# Patient Record
Sex: Female | Born: 1985 | Race: Black or African American | Hispanic: No | Marital: Single | Smoking: Never smoker
Health system: Southern US, Community
[De-identification: ages and names within clinical notes are randomized; demographics above are authoritative.]

## PROBLEM LIST (undated history)

## (undated) DIAGNOSIS — F22 Delusional disorders: Secondary | ICD-10-CM

## (undated) DIAGNOSIS — F259 Schizoaffective disorder, unspecified: Secondary | ICD-10-CM

## (undated) DIAGNOSIS — F319 Bipolar disorder, unspecified: Secondary | ICD-10-CM

---
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
## 2005-11-24 ENCOUNTER — Emergency Department (HOSPITAL_COMMUNITY): Admission: EM | Admit: 2005-11-24 | Discharge: 2005-11-24 | Payer: Self-pay | Admitting: Family Medicine

## 2006-01-29 ENCOUNTER — Emergency Department (HOSPITAL_COMMUNITY): Admission: EM | Admit: 2006-01-29 | Discharge: 2006-01-30 | Payer: Self-pay | Admitting: *Deleted

## 2006-05-27 ENCOUNTER — Ambulatory Visit: Payer: Self-pay | Admitting: *Deleted

## 2006-05-27 ENCOUNTER — Inpatient Hospital Stay (HOSPITAL_COMMUNITY): Admission: AD | Admit: 2006-05-27 | Discharge: 2006-05-31 | Payer: Self-pay | Admitting: *Deleted

## 2006-10-25 ENCOUNTER — Emergency Department (HOSPITAL_COMMUNITY): Admission: EM | Admit: 2006-10-25 | Discharge: 2006-10-25 | Payer: Self-pay | Admitting: Emergency Medicine

## 2006-11-03 ENCOUNTER — Emergency Department (HOSPITAL_COMMUNITY): Admission: EM | Admit: 2006-11-03 | Discharge: 2006-11-04 | Payer: Self-pay | Admitting: *Deleted

## 2006-11-27 ENCOUNTER — Emergency Department (HOSPITAL_COMMUNITY): Admission: EM | Admit: 2006-11-27 | Discharge: 2006-11-27 | Payer: Self-pay | Admitting: Emergency Medicine

## 2007-10-18 ENCOUNTER — Emergency Department (HOSPITAL_COMMUNITY): Admission: EM | Admit: 2007-10-18 | Discharge: 2007-10-18 | Payer: Self-pay | Admitting: Emergency Medicine

## 2007-10-21 ENCOUNTER — Inpatient Hospital Stay (HOSPITAL_COMMUNITY): Admission: AD | Admit: 2007-10-21 | Discharge: 2007-10-29 | Payer: Self-pay | Admitting: Psychiatry

## 2007-10-21 ENCOUNTER — Ambulatory Visit: Payer: Self-pay | Admitting: Psychiatry

## 2009-02-25 ENCOUNTER — Emergency Department (HOSPITAL_COMMUNITY): Admission: EM | Admit: 2009-02-25 | Discharge: 2009-02-26 | Payer: Self-pay | Admitting: Emergency Medicine

## 2009-08-25 DIAGNOSIS — F319 Bipolar disorder, unspecified: Secondary | ICD-10-CM

## 2009-08-25 HISTORY — DX: Bipolar disorder, unspecified: F31.9

## 2009-09-09 ENCOUNTER — Emergency Department (HOSPITAL_COMMUNITY): Admission: EM | Admit: 2009-09-09 | Discharge: 2009-09-09 | Payer: Self-pay | Admitting: Emergency Medicine

## 2009-09-10 ENCOUNTER — Emergency Department (HOSPITAL_COMMUNITY): Admission: EM | Admit: 2009-09-10 | Discharge: 2009-09-10 | Payer: Self-pay | Admitting: Emergency Medicine

## 2009-10-19 ENCOUNTER — Emergency Department (HOSPITAL_COMMUNITY): Admission: EM | Admit: 2009-10-19 | Discharge: 2009-10-20 | Payer: Self-pay | Admitting: Emergency Medicine

## 2009-10-27 ENCOUNTER — Emergency Department (HOSPITAL_COMMUNITY): Admission: EM | Admit: 2009-10-27 | Discharge: 2009-10-27 | Payer: Self-pay | Admitting: Emergency Medicine

## 2010-07-19 ENCOUNTER — Emergency Department (HOSPITAL_COMMUNITY): Admission: EM | Admit: 2010-07-19 | Discharge: 2010-07-19 | Payer: Self-pay | Admitting: Family Medicine

## 2010-11-05 LAB — POCT URINALYSIS DIPSTICK
Bilirubin Urine: NEGATIVE
Glucose, UA: NEGATIVE mg/dL
Nitrite: NEGATIVE
Specific Gravity, Urine: 1.015 (ref 1.005–1.030)
Urobilinogen, UA: 0.2 mg/dL (ref 0.0–1.0)

## 2010-11-05 LAB — POCT PREGNANCY, URINE: Preg Test, Ur: NEGATIVE

## 2010-11-10 LAB — POCT PREGNANCY, URINE: Preg Test, Ur: NEGATIVE

## 2010-11-10 LAB — POCT URINALYSIS DIP (DEVICE)
Bilirubin Urine: NEGATIVE
Glucose, UA: NEGATIVE mg/dL
Nitrite: NEGATIVE
Specific Gravity, Urine: 1.01 (ref 1.005–1.030)
Urobilinogen, UA: 0.2 mg/dL (ref 0.0–1.0)

## 2010-11-11 LAB — URINALYSIS, ROUTINE W REFLEX MICROSCOPIC
Ketones, ur: 15 mg/dL — AB
Leukocytes, UA: NEGATIVE
Nitrite: NEGATIVE
Protein, ur: 30 mg/dL — AB

## 2010-11-11 LAB — WET PREP, GENITAL

## 2010-11-11 LAB — POCT PREGNANCY, URINE: Preg Test, Ur: NEGATIVE

## 2010-11-13 LAB — URINALYSIS, ROUTINE W REFLEX MICROSCOPIC
Bilirubin Urine: NEGATIVE
Nitrite: POSITIVE — AB
Specific Gravity, Urine: 1.013 (ref 1.005–1.030)
Urobilinogen, UA: 1 mg/dL (ref 0.0–1.0)

## 2010-11-13 LAB — URINE MICROSCOPIC-ADD ON

## 2010-11-18 LAB — URINE MICROSCOPIC-ADD ON

## 2010-11-18 LAB — URINALYSIS, ROUTINE W REFLEX MICROSCOPIC
Protein, ur: 30 mg/dL — AB
Urobilinogen, UA: 0.2 mg/dL (ref 0.0–1.0)

## 2010-11-18 LAB — URINE CULTURE
Colony Count: NO GROWTH
Culture: NO GROWTH

## 2010-11-18 LAB — POCT PREGNANCY, URINE: Preg Test, Ur: NEGATIVE

## 2010-12-01 LAB — URINALYSIS, ROUTINE W REFLEX MICROSCOPIC
Bilirubin Urine: NEGATIVE
Nitrite: NEGATIVE
Specific Gravity, Urine: 1.016 (ref 1.005–1.030)
pH: 7.5 (ref 5.0–8.0)

## 2010-12-01 LAB — HEMOCCULT GUIAC POC 1CARD (OFFICE): Fecal Occult Bld: NEGATIVE

## 2010-12-01 LAB — WET PREP, GENITAL: Yeast Wet Prep HPF POC: NONE SEEN

## 2010-12-01 LAB — POCT PREGNANCY, URINE: Preg Test, Ur: NEGATIVE

## 2011-01-07 NOTE — H&P (Signed)
[name]                  ACCOUNT NO.:  [id number]   MEDICAL RECORD NO.:  [id number]          PATIENT TYPE:  IPS   LOCATION:  0402                          FACILITY:  [facility]   PHYSICIAN:  Anselm Jungling, MD  DATE OF BIRTH:  1985/11/07   DATE OF ADMISSION:  10/21/2007  DATE OF DISCHARGE:                       PSYCHIATRIC ADMISSION ASSESSMENT   DATE OF THE ASSESSMENT:  October 22, 2007, at 1330.   IDENTIFYING INFORMATION:  A 25 year old [place] American female, single.  This is a voluntary admission.   HISTORY OF THE PRESENT ILLNESS:  This 25 year old was brought to mental  health by her mom who reported that she had not been sleeping, was not  [name] for the previous 2 weeks, and was internally stimulated, hearing  voices.  The patient [name] does report that she has been hearing  voices but is unable to discern any content to the voices.  Her mother  has reported that she was hearing voices to kill her sister's children  and thoughts of harming [name].  [name] today is very shy and  quiet, appears a little bit confused, definitely distracted, possibly  internally stimulated.  Reports that she has been taking her medications  and can give [place] the doses.  She is receiving counseling from [name]  at mental health.  She acknowledges that her father died in 09-19-2007 and had been sick with cancer for quite some time.  Her speech is  minimal and so soft as to be almost inaudible at times.  She does warm  up a little bit with conversation, knows that she is at [facility], the location.  Does not seem to have good grasp of actually  why she is here.  Looks a little bit perplexed.  Concentration is  decreased.  She is denying any suicidal thoughts today, denies hearing  any auditory hallucinations.  She is a poor historian.   PAST PSYCHIATRIC HISTORY:  The patient is followed as an outpatient at  [facility] by Dr. Lerry Liner.   She has a history  of a prior admissions to [facility] in March 2008 and is  currently receiving counseling by [name], psychotherapist, at  [facility].  She has a history of prior admission to  the service of Dr. Milford Cage October 3 to May 31, 2006, on an  involuntary basis for delusions and paranoia.  At that time she had  began seeing a homeopathic physician who been weaning off her  medications and she had become psychotic, manic, hyperreligious and  argumentative with decreased sleep.  She has no known history of alcohol  or drug abuse.  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   SOCIAL HISTORY:  Single African American female living at home with her  mother and her sister.  She is unemployed, never married, no children.  Duration of education is unclear.  No legal problems.  No history of  substance abuse.   FAMILY HISTORY:  Is not available.  MEDICAL HISTORY:  No regular primary care physician is identified.   MEDICAL PROBLEMS:  None.   PAST MEDICAL HISTORY:  The patient was seen in the emergency room on  February 23 for disrupted sleep, was coherent at that time and did not  require any treatment.  Lithium level at that time 1.23.  Kidney  function normal.  Alcohol level 7.  The patient was referred back to her  primary care physician to follow up on a diagnosis of insomnia.  Past  medical history is remarkable for bipolar disorder and depression.   MEDICATIONS:  1. Lithium 600 mg p.o. q.h.s.  2. Geodon 60 mg p.o. q.h.s.   DRUG ALLERGIES:  RISPERDAL which causes unknown reaction.   Some question if the patient takes a medication called PrevAmine, which  has not been validated.   PHYSICAL EXAM:  Was done in the emergency room.  Diagnostic studies:  Alcohol level less than 7.  Lithium level is 1.23.  Creatinine is 1.1 on  i-STAT 8, hemoglobin 13.9, hematocrit  41, potassium is 3.7, sodium 137,  random glucose 106, BUN 5 and creatinine 1.1.   MENTAL STATUS EXAM:  Fully alert young female, appears mildly anxious.  Initially says very little overall, possibly one word, barely audible.  Does gradually warm up to being able to give [place] some short sentences.  still continues to be very soft-spoken.  Speech is not fluent, two or  three words here and there.  Concentration is decreased.  Registration  is intact.  She does appear to be possibly internally distracted.  She  is cooperative, polite.  Grooming is appropriate as is dress.  Anxious.  Knows that she is in [place] but cannot say why.  Admits to auditory  hallucinations.  Vague latent responses, stating that she is fine and  everything is fine.  Not sure she wants [place] to call her mother.  Oriented  to person, time and location.  She has been up and about on the unit  attending meals, listening in groups and making phone calls today.   AXIS I:  Rule out schizoaffective disorder, bipolar type, depressed.  AXIS II:  No diagnosis.  AXIS III:  No diagnosis.  AXIS IV:  Severe issues with father's death 1 month ago.  AXIS V:  Current 32, past year not known.   Plan is to voluntarily admit the patient to stabilize her.  We hope to  get some information from her mother.  Our goal is to decrease her  internal stimulation, help her to express [name] better, speech more  fluent, more articulate.  Help to normalize her sleep, reduce her  internal distractions.  We are going to increase her Geodon to 80 mg at  suppertime and continue her lithium at the current level, and will  validate PrevAmine medication.  Estimated length of stay is 5-7 days.      Margaret A. Lorin Picket, N.P.      Anselm Jungling, MD  Electronically Signed    MAS/MEDQ  D:  10/22/2007  T:  10/23/2007  Job:  [id number]

## 2011-01-10 NOTE — Discharge Summary (Signed)
[name], [name]                  ACCOUNT NO.:  [id number]   MEDICAL RECORD NO.:  [id number]          PATIENT TYPE:  IPS   LOCATION:  0407                          FACILITY:  [facility]   PHYSICIAN:  Jasmine Pang, M.D. DATE OF BIRTH:  Dec 22, 1985   DATE OF ADMISSION:  05/27/2006  DATE OF DISCHARGE:  05/31/2006                                 DISCHARGE SUMMARY   [name] was on the adult unit.   IDENTIFICATION:  A 25 year old single African-American female who was  admitted on an involuntary basis on May 27, 2006.   HISTORY OF PRESENT ILLNESS:  The patient has a history of psychosis with  delusions and paranoia. [name] had begun to see a homeopathic physician who had  been weaning her off her lithium and other medications. The patient became  psychotic, manic, hyperreligious, and argumentative. [name] was also having  decreased sleep. [name] has had no history of violence. This is her first [facility]  admission. [name] sees Dr. Allyne Gee at the [facility]. [name]  has no known history of alcohol or drug use abuse.   MEDICATIONS:  1. Abilify 20 mg daily.  2. Lithium 300 mg but [name] has been tapered off these meds.   MEDICATION ALLERGIES:  Risperdal.   For further information see psychiatric admission assessment.   PHYSICAL EXAMINATION:  GENERAL:  There were no acute medical problems. The  patient was healthy and in no acute distress.   ADMISSION LABORATORIES:  CBC is within normal limits. CMET within normal  limits. TSH 1.636 which was within normal limits.   HOSPITAL COURSE:  Upon admission the patient was placed on Zyprexa Zydis 5  mg p.o. q.6 h. p.r.n. agitation and Ativan 2 mg p.o. or IM q.6 h. p.r.n.  agitation, Geodon IM x10 mg IM x1 for emergency sedation, The patient was on  lithium carbonate 300 mg daily. On May 28, 2006 the patient was placed on  Zyprexa Zydis 5 mg p.o. q.h.s. p.r.n. Zyprexa was continued. The patient's  lithium carbonate was increased to 300 mg p.o.  b.i.d. on May 30, 2006.  The patient's lithium carbonate was increased to 300 mg in the morning and  600 mg at h.s. An a.m. lithium level done the day prior to discharge was  0.62 (0.8 to 1.4). The patient tolerated her medications well with no  significant side effects except for some possible sedation.   Upon first meeting the patient [name] was lying in bed. [name] was very sedate  because of the meds [name] had received the night before. [name] kept falling  asleep as I talked with her though [name] was polite and attempted to be  cooperative on May 29, 2006. The patient was paranoid about her new  roommate. [name] did not sleep well. [name] would not take the Zyprexa. [name] states  [name] is here due to depression and racing thoughts at night. [name] states [name]  had started homeopathic meds and gotten off her lithium. At the urging of  her mother over the phone [name] did begin to comply with medications here. On  May 30, 2006 the patient's mental status had improved. [name] was sleepy.  The staff reported that [name] had become more tearful and sad in the evenings.  [name] was tolerating her lithium carbonate, and this was increased to 300 mg  in the morning and 600 mg at h.s. [name] still questions me about whether [name]  needs really needs a lithium, but has been in agreement to taking it.   On May 31, 2006 the patient's mental status had improved markedly. [name]  was friendly and cooperative with good eye contact. Speech was normal rate  and flow. Psychomotor activity was within normal limits. Her mood was less  depressed. Affect able to reveal wider range though still some constriction.  There was no suicidal or homicidal ideation. No self injurious behavior. No  auditory or visual hallucinations. No paranoia or delusions. Thoughts were  logical and goal-directed. Thought content no predominant theme. Cognitive  exam was grossly within normal limits back to baseline.   DISCHARGE DIAGNOSES:   AXIS I:   Schizoaffective disorder mixed, schizoaffective disorder bipolar  type.   AXIS II:  None.   AXIS III:  No acute or chronic health problems.   AXIS IV:  Moderate (other psychosocial problem).   AXIS V:  GAF upon discharge was 50. GAF upon admission was 20. GAF highest  past year was 65.   DISCHARGE PLANS:  There were no specific activity level or dietary  restrictions.   DISCHARGE MEDICATIONS:  1. Zyprexa 5 mg q.h.s.  2. Lithium carbonate 300 mg in the morning and 600 mg at bedtime. Lithium      level was 0.62 (0.8-1.4).   POST HOSPITAL CARE PLANS:  The patient will be seen at the [facility]  on October 11 at 10 a.m. [name] will also be seen by Jeannene Patella on October 22.      Jasmine Pang, M.D.  Electronically Signed     BHS/MEDQ  D:  05/31/2006  T:  06/01/2006  Job:  [id number]

## 2011-01-10 NOTE — Discharge Summary (Signed)
[name], [name]                  ACCOUNT NO.:  [id number]   MEDICAL RECORD NO.:  [id number]          PATIENT TYPE:  IPS   LOCATION:  0402                          FACILITY:  [facility]   PHYSICIAN:  Anselm Jungling, MD  DATE OF BIRTH:  Jun 03, 1986   DATE OF ADMISSION:  10/21/2007  DATE OF DISCHARGE:  10/29/2007                               DISCHARGE SUMMARY   IDENTIFYING DATA/REASON FOR ADMISSION:  The patient is a 25 year old,  single, African-American female who stated, I got here 3 days ago, on  the day of her admission.  She lives with her family.  She was admitted  with symptoms of psychosis.  Her father had died recently.  She had been  seeing a therapist at [facility].  Please refer to  the admission note for further details pertaining to the symptoms,  circumstances, and history that led to her hospitalization.  She was  given an initial Axis I diagnosis of psychosis NOS and rule out  substance-induced psychosis.   MEDICAL AND LABORATORY:  The patient was medically and physically  assessed by the psychiatric nurse practitioner.  She was in good health  without any active or chronic medical problems.   HOSPITAL COURSE:  The patient was admitted to the Adult Inpatient  Psychiatric Service.  She presented as a well-nourished, well-developed  but very petite, African-American female who was polite, shy, very soft  spoken, and anxious.  She was partially oriented.  She knew that she was  in [facility] but could not say why.  She appeared confused.  There  was the possibility that she was responding to internal stimuli, and she  did eventually admit to auditory hallucinations.  Her verbal responses  were latent, vague, and brief.  I am fine.   The patient appeared to be in need of medication stabilization for her  psychotic disorder.  She was treated with a regimen of Geodon and  lithium carbonate.  She initially refused several of her medication  doses, but  after the third day, began taking her medication more  regularly.  We involved her family, and there was a family session that  occurred on the sixth hospital day.  The patient's treatment needs, both  in the hospital and in terms of aftercare, were reviewed at length.   By the ninth hospital day, the patient appeared to be stable enough for  discharge.  At the time of discharge, she verbalized an understanding of  the need for medication and verbalized her intent to follow through with  medication taking and outpatient followup.  She agreed to the following  aftercare plan.   AFTERCARE:  The patient was to followup at [facility]  with an appointment on November 02, 2007.  In addition, she was referred to  [facility], with an appointment on  October 29, 2007.   DISCHARGE MEDICATIONS:  1. Lithium carbonate 600 mg daily.  2. Geodon 80 mg nightly.   DISCHARGE DIAGNOSES:  AXIS I:  Psychosis not otherwise specified.  AXIS  II:  Deferred.  AXIS III:  No acute or chronic illnesses.  AXIS IV:  Stressors severe.  AXIS V:  GAF on discharge 50.      Anselm Jungling, MD  Electronically Signed     SPB/MEDQ  D:  12/02/2007  T:  12/02/2007  Job:  [id number]

## 2011-02-06 ENCOUNTER — Emergency Department (HOSPITAL_COMMUNITY)
Admission: EM | Admit: 2011-02-06 | Discharge: 2011-02-06 | Disposition: A | Discharge status: Home / Self Care | Payer: Self-pay | Attending: Emergency Medicine | Admitting: Emergency Medicine

## 2011-02-06 DIAGNOSIS — L2089 Other atopic dermatitis: Secondary | ICD-10-CM | POA: Insufficient documentation

## 2011-02-06 DIAGNOSIS — L299 Pruritus, unspecified: Secondary | ICD-10-CM | POA: Insufficient documentation

## 2011-02-06 DIAGNOSIS — Z79899 Other long term (current) drug therapy: Secondary | ICD-10-CM | POA: Insufficient documentation

## 2011-02-06 DIAGNOSIS — F3130 Bipolar disorder, current episode depressed, mild or moderate severity, unspecified: Secondary | ICD-10-CM | POA: Insufficient documentation

## 2011-05-16 LAB — CBC
Hemoglobin: 14.8
RBC: 5.17 — ABNORMAL HIGH
WBC: 9.8

## 2011-05-16 LAB — COMPREHENSIVE METABOLIC PANEL
ALT: 14
AST: 19
Alkaline Phosphatase: 53
CO2: 27
GFR calc Af Amer: >60
GFR calc non Af Amer: >60
Glucose, Bld: 100 — ABNORMAL HIGH
Potassium: 3.9
Sodium: 140

## 2011-05-16 LAB — I-STAT 8, (EC8 V) (CONVERTED LAB)
BUN: 5 — ABNORMAL LOW
Bicarbonate: 26 — ABNORMAL HIGH
Glucose, Bld: 106 — ABNORMAL HIGH
Hemoglobin: 13.9
TCO2: 27
pCO2, Ven: 40.2 — ABNORMAL LOW
pH, Ven: 7.419 — ABNORMAL HIGH

## 2011-05-16 LAB — MAGNESIUM: Magnesium: 2.6 — ABNORMAL HIGH

## 2011-05-16 LAB — POCT I-STAT CREATININE: Creatinine, Ser: 1.1

## 2011-05-19 LAB — URINALYSIS, ROUTINE W REFLEX MICROSCOPIC
Glucose, UA: NEGATIVE
Hgb urine dipstick: NEGATIVE
Ketones, ur: 15 — AB
Protein, ur: NEGATIVE
pH: 6.5

## 2011-05-19 LAB — URINE DRUGS OF ABUSE SCREEN W ALC, ROUTINE (REF LAB)
Amphetamine Screen, Ur: NEGATIVE
Barbiturate Quant, Ur: NEGATIVE
Benzodiazepines.: NEGATIVE
Ethyl Alcohol: <5
Marijuana Metabolite: NEGATIVE
Opiate Screen, Urine: NEGATIVE

## 2011-05-19 LAB — PREGNANCY, URINE: Preg Test, Ur: NEGATIVE

## 2011-06-15 ENCOUNTER — Inpatient Hospital Stay (INDEPENDENT_AMBULATORY_CARE_PROVIDER_SITE_OTHER)
Admission: RE | Admit: 2011-06-15 | Discharge: 2011-06-15 | Disposition: A | Discharge status: Home / Self Care | Payer: Self-pay | Source: Ambulatory Visit | Attending: Family Medicine | Admitting: Family Medicine

## 2011-06-15 DIAGNOSIS — L738 Other specified follicular disorders: Secondary | ICD-10-CM

## 2011-08-10 ENCOUNTER — Encounter: Payer: Self-pay | Admitting: *Deleted

## 2011-08-10 ENCOUNTER — Emergency Department (INDEPENDENT_AMBULATORY_CARE_PROVIDER_SITE_OTHER)
Admission: EM | Admit: 2011-08-10 | Discharge: 2011-08-10 | Disposition: A | Discharge status: Home / Self Care | Payer: Self-pay | Source: Home / Self Care | Attending: Emergency Medicine | Admitting: Emergency Medicine

## 2011-08-10 DIAGNOSIS — N390 Urinary tract infection, site not specified: Secondary | ICD-10-CM

## 2011-08-10 HISTORY — DX: Bipolar disorder, unspecified: F31.9

## 2011-08-10 LAB — POCT URINALYSIS DIP (DEVICE)
Glucose, UA: NEGATIVE mg/dL
Ketones, ur: 15 mg/dL — AB
Nitrite: NEGATIVE
pH: 5.5 (ref 5.0–8.0)

## 2011-08-10 LAB — POCT PREGNANCY, URINE: Preg Test, Ur: NEGATIVE

## 2011-08-10 MED ORDER — CEPHALEXIN 500 MG PO CAPS: 500.0000 mg | ORAL_CAPSULE | Freq: Three times a day (TID) | ORAL | Status: AC

## 2011-08-10 MED ORDER — PHENAZOPYRIDINE HCL 200 MG PO TABS: 200.0000 mg | ORAL_TABLET | Freq: Three times a day (TID) | ORAL | Status: AC | PRN

## 2011-08-10 NOTE — ED Provider Notes (Signed)
History     CSN: [id number] Arrival date & time: 08/10/2011  4:17 PM   First MD Initiated Contact with Patient 08/10/11 1445      Chief Complaint  Patient presents with  . Dysuria    (Consider location/radiation/quality/duration/timing/severity/associated sxs/prior treatment) HPI Comments: [name] is a 25 year old female with bipolar disorder who has had a two-day history of dysuria, urinary frequency, lower abdominal pain, urinary urgency, urge incontinence, and gross hematuria. [name] denies any fever, chills, nausea, vomiting, lower back pain, or GYN complaints. [name] has a history of a single urinary tract infection 2 years ago.  Patient is a 25 y.o. female presenting with dysuria.  Dysuria  Associated symptoms include frequency, hematuria and urgency. Pertinent negatives include no chills, no nausea, no vomiting and no flank pain.    Past Medical History  Diagnosis Date  . Bipolar 1 disorder     History reviewed. No pertinent past surgical history.  History reviewed. No pertinent family history.  History  Substance Use Topics  . Smoking status: Never Smoker   . Smokeless tobacco: Not on file  . Alcohol Use: No    OB History    Grav Para Term Preterm Abortions TAB SAB Ect Mult Living                  Review of Systems  Constitutional: Negative for fever and chills.  Gastrointestinal: Positive for abdominal pain. Negative for nausea and vomiting.  Genitourinary: Positive for dysuria, urgency, frequency, hematuria and pelvic pain. Negative for flank pain.    Allergies  Review of patient's allergies indicates no known allergies.  Home Medications   Current Outpatient Rx  Name Route Sig Dispense Refill  . HALOPERIDOL 0.5 MG PO TABS Oral Take 0.5 mg by mouth 2 (two) times daily.      Marland Kitchen LITHIUM CARBONATE 600 MG PO CAPS Oral Take 600 mg by mouth 1 day or 1 dose.      . CEPHALEXIN 500 MG PO CAPS Oral Take 1 capsule (500 mg total) by mouth 3 (three) times daily. 30  capsule 0  . PHENAZOPYRIDINE HCL 200 MG PO TABS Oral Take 1 tablet (200 mg total) by mouth 3 (three) times daily as needed for pain. 15 tablet 0    BP 120/77  Pulse 77  Temp(Src) 98.7 F (37.1 C) (Oral)  Resp 18  SpO2 100%  LMP 07/18/2011  Physical Exam  Nursing note and vitals reviewed. Constitutional: [name] appears well-developed and well-nourished. No distress.  Cardiovascular: Normal rate and regular rhythm.  Exam reveals no gallop and no friction rub.   No murmur heard. Pulmonary/Chest: Effort normal. No respiratory distress. [name] has no wheezes. [name] has no rales.  Abdominal: Soft. Bowel sounds are normal. [name] exhibits no distension and no mass. There is no hepatosplenomegaly. There is no tenderness. There is no rebound, no guarding and no CVA tenderness.  Skin: [name] is not diaphoretic.    ED Course  Procedures (including critical care time)  Results for orders placed during the hospital encounter of 08/10/11  POCT URINALYSIS DIP (DEVICE)      Component Value Range   Glucose, UA NEGATIVE  NEGATIVE (mg/dL)   Bilirubin Urine SMALL (*) NEGATIVE    Ketones, ur 15 (*) NEGATIVE (mg/dL)   Specific Gravity, Urine 1.025  1.005 - 1.030    Hgb urine dipstick LARGE (*) NEGATIVE    pH 5.5  5.0 - 8.0    Protein, ur >=300 (*) NEGATIVE (mg/dL)   Urobilinogen, UA  0.2  0.0 - 1.0 (mg/dL)   Nitrite NEGATIVE  NEGATIVE    Leukocytes, UA TRACE (*) NEGATIVE   POCT PREGNANCY, URINE      Component Value Range   Preg Test, Ur NEGATIVE       Labs Reviewed  POCT URINALYSIS DIP (DEVICE) - Abnormal; Notable for the following:    Bilirubin Urine SMALL (*)    Ketones, ur 15 (*)    Hgb urine dipstick LARGE (*)    Protein, ur >=300 (*)    Leukocytes, UA TRACE (*) Biochemical Testing Only. Please order routine urinalysis from main lab if confirmatory testing is needed.   All other components within normal limits  POCT PREGNANCY, URINE  POCT PREGNANCY, URINE  POCT URINALYSIS DIPSTICK   No results  found.   1. UTI (lower urinary tract infection)       MDM  [name] has a urinary tract infection and will treat with cephalexin for 10 days.        Roque Lias, MD 08/10/11 [phone]

## 2011-08-10 NOTE — ED Notes (Signed)
CO LOW ABD PAIN AND PAIN WITH URINATION X 2 DAYS, ALSO STATES SHE HAS SEEN BLOOD IN URINE

## 2012-03-09 ENCOUNTER — Encounter (HOSPITAL_COMMUNITY): Payer: Self-pay | Admitting: *Deleted

## 2012-03-09 ENCOUNTER — Emergency Department (HOSPITAL_COMMUNITY)
Admission: EM | Admit: 2012-03-09 | Discharge: 2012-03-10 | Disposition: A | Discharge status: Other Acute Inpatient Hospital | Payer: BC Managed Care – PPO | Attending: Emergency Medicine | Admitting: Emergency Medicine

## 2012-03-09 DIAGNOSIS — Z008 Encounter for other general examination: Secondary | ICD-10-CM | POA: Insufficient documentation

## 2012-03-09 DIAGNOSIS — R443 Hallucinations, unspecified: Secondary | ICD-10-CM

## 2012-03-09 DIAGNOSIS — F319 Bipolar disorder, unspecified: Secondary | ICD-10-CM

## 2012-03-09 DIAGNOSIS — F259 Schizoaffective disorder, unspecified: Secondary | ICD-10-CM

## 2012-03-09 LAB — CBC
Hemoglobin: 13.7 g/dL (ref 12.0–15.0)
Platelets: 291 10*3/uL (ref 150–400)
RBC: 5.07 MIL/uL (ref 3.87–5.11)
WBC: 6 10*3/uL (ref 4.0–10.5)

## 2012-03-09 LAB — RAPID URINE DRUG SCREEN, HOSP PERFORMED
Amphetamines: NOT DETECTED
Barbiturates: NOT DETECTED
Opiates: NOT DETECTED
Tetrahydrocannabinol: NOT DETECTED

## 2012-03-09 LAB — COMPREHENSIVE METABOLIC PANEL
ALT: 11 U/L (ref 0–35)
AST: 17 U/L (ref 0–37)
Alkaline Phosphatase: 53 U/L (ref 39–117)
CO2: 27 mEq/L (ref 19–32)
Calcium: 9.5 mg/dL (ref 8.4–10.5)
Chloride: 100 mEq/L (ref 96–112)
GFR calc Af Amer: >90 mL/min (ref >90)
GFR calc non Af Amer: >90 mL/min (ref >90)
Glucose, Bld: 91 mg/dL (ref 70–99)
Potassium: 3.7 mEq/L (ref 3.5–5.1)
Sodium: 136 mEq/L (ref 135–145)
Total Bilirubin: 0.3 mg/dL (ref 0.3–1.2)

## 2012-03-09 LAB — POCT PREGNANCY, URINE: Preg Test, Ur: NEGATIVE

## 2012-03-09 MED ORDER — ZOLPIDEM TARTRATE 5 MG PO TABS: 5.0000 mg | ORAL_TABLET | Freq: Every evening | ORAL | Status: DC | PRN

## 2012-03-09 MED ORDER — LITHIUM CARBONATE 300 MG PO CAPS: 600.0000 mg | ORAL_CAPSULE | Freq: Every evening | ORAL | Status: DC

## 2012-03-09 MED ORDER — LORAZEPAM 1 MG PO TABS: 1.0000 mg | ORAL_TABLET | Freq: Three times a day (TID) | ORAL | Status: DC | PRN

## 2012-03-09 MED ORDER — ACETAMINOPHEN 325 MG PO TABS: 650.0000 mg | ORAL_TABLET | ORAL | Status: DC | PRN

## 2012-03-09 MED ORDER — IBUPROFEN 600 MG PO TABS: 600.0000 mg | ORAL_TABLET | Freq: Three times a day (TID) | ORAL | Status: DC | PRN

## 2012-03-09 MED ORDER — LITHIUM CARBONATE 300 MG PO CAPS
600.0000 mg | ORAL_CAPSULE | Freq: Every day | ORAL | Status: DC
  Administered 2012-03-09 – 2012-03-10 (×2): 600 mg via ORAL
  Filled 2012-03-09 (×2): qty 2

## 2012-03-09 MED ORDER — ONDANSETRON HCL 4 MG PO TABS: 4.0000 mg | ORAL_TABLET | Freq: Three times a day (TID) | ORAL | Status: DC | PRN

## 2012-03-09 NOTE — ED Notes (Signed)
Up tot he bathroom to shower and change scrubs 

## 2012-03-09 NOTE — ED Notes (Signed)
Pt.belonging in triage locker#4

## 2012-03-09 NOTE — ED Notes (Signed)
Mom into see 
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
## 2012-03-09 NOTE — ED Provider Notes (Signed)
History     CSN: [id number]  Arrival date & time 03/09/12  1400   First MD Initiated Contact with Patient 03/09/12 1449      Chief Complaint  Patient presents with  . Medical Clearance    (Consider location/radiation/quality/duration/timing/severity/associated sxs/prior treatment) Patient is a 26 y.o. female presenting with mental health disorder. The history is provided by the patient.  Mental Health Problem  Pt here with mental disorder, IVCed by her mother who is concerned that pt may be hallucinating, manic, depressed. Pt states she denies all symptoms. Denies SI or HI. States that "there are internal issues that need to be solved" but refuses to state what these issues are. States she   Past Medical History  Diagnosis Date  . Bipolar 1 disorder     History reviewed. No pertinent past surgical history.  No family history on file.  History  Substance Use Topics  . Smoking status: Never Smoker   . Smokeless tobacco: Not on file  . Alcohol Use: No    OB History    Grav Para Term Preterm Abortions TAB SAB Ect Mult Living                  Review of Systems  Constitutional: Negative for fever and chills.  Respiratory: Negative.   Cardiovascular: Negative.   Gastrointestinal: Negative.   Genitourinary: Negative for dysuria and flank pain.  Musculoskeletal: Negative for back pain.  Skin: Negative.   Neurological: Negative.   Psychiatric/Behavioral: Negative for suicidal ideas. The patient is not nervous/anxious.     Allergies  Quetiapine  Home Medications   Current Outpatient Rx  Name Route Sig Dispense Refill  . LITHIUM CARBONATE 600 MG PO CAPS Oral Take 600 mg by mouth every evening.       There were no vitals taken for this visit.  Physical Exam  Nursing note and vitals reviewed. Constitutional: She is oriented to person, place, and time. She appears well-developed and well-nourished. No distress.  HENT:  Head: Normocephalic.  Eyes: Conjunctivae  are normal.  Cardiovascular: Normal rate, regular rhythm and normal heart sounds.   Pulmonary/Chest: Effort normal and breath sounds normal. No respiratory distress. She has no wheezes. She has no rales.  Abdominal: Soft. Bowel sounds are normal. She exhibits no distension. There is no tenderness. There is no rebound.  Neurological: She is alert and oriented to person, place, and time.  Skin: Skin is warm and dry.  Psychiatric: She has a normal mood and affect.       Inappropriate affect, smiling out of context. Denies SI, HI    ED Course  Procedures (including critical care time)  Pt IVCed by mother for hallucinations, suspicious behavior. Pt in NAD. Denies SI or HI.   Results for orders placed during the hospital encounter of 03/09/12  CBC      Component Value Range   WBC 6.0  4.0 - 10.5 K/uL   RBC 5.07  3.87 - 5.11 MIL/uL   Hemoglobin 13.7  12.0 - 15.0 g/dL   HCT 40.9  81.1 - 91.4 %   MCV 78.7  78.0 - 100.0 fL   MCH 27.0  26.0 - 34.0 pg   MCHC 34.3  30.0 - 36.0 g/dL   RDW 78.2  95.6 - 21.3 %   Platelets 291  150 - 400 K/uL  COMPREHENSIVE METABOLIC PANEL      Component Value Range   Sodium 136  135 - 145 mEq/L   Potassium 3.7  3.5 - 5.1 mEq/L   Chloride 100  96 - 112 mEq/L   CO2 27  19 - 32 mEq/L   Glucose, Bld 91  70 - 99 mg/dL   BUN 9  6 - 23 mg/dL   Creatinine, Ser 1.91  0.50 - 1.10 mg/dL   Calcium 9.5  8.4 - 47.8 mg/dL   Total Protein 7.7  6.0 - 8.3 g/dL   Albumin 4.3  3.5 - 5.2 g/dL   AST 17  0 - 37 U/L   ALT 11  0 - 35 U/L   Alkaline Phosphatase 53  39 - 117 U/L   Total Bilirubin 0.3  0.3 - 1.2 mg/dL   GFR calc non Af Amer >90  >90 mL/min   GFR calc Af Amer >90  >90 mL/min  ETHANOL      Component Value Range   Alcohol, Ethyl (B) <11  0 - 11 mg/dL  ACETAMINOPHEN LEVEL      Component Value Range   Acetaminophen (Tylenol), Serum <15.0  10 - 30 ug/mL  URINE RAPID DRUG SCREEN (HOSP PERFORMED)      Component Value Range   Opiates NONE DETECTED  NONE DETECTED    Cocaine NONE DETECTED  NONE DETECTED   Benzodiazepines NONE DETECTED  NONE DETECTED   Amphetamines NONE DETECTED  NONE DETECTED   Tetrahydrocannabinol NONE DETECTED  NONE DETECTED   Barbiturates NONE DETECTED  NONE DETECTED  LITHIUM LEVEL      Component Value Range   Lithium Lvl 0.25 (*) 0.80 - 1.40 mEq/L  POCT PREGNANCY, URINE      Component Value Range   Preg Test, Ur NEGATIVE  NEGATIVE   No results found.  Pt medically cleared. ACT to assess.    1. Bipolar 1 disorder   2. Hallucinations       MDM         Lottie Mussel, PA 03/12/12 2326
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
## 2012-03-09 NOTE — Progress Notes (Signed)
Pt is currently pending review at [facility] and [facility]. Oncoming CSW/ACT to follow up for disposition.

## 2012-03-09 NOTE — ED Notes (Signed)
Pt reports she is here due to irritation. IVC papers taken out by mother- mother believes medications not helping, reports daughter hallucinating. Pt denies SI/HI. Pt noted to be talking to self, but will answer questions appropriately.

## 2012-03-09 NOTE — ED Notes (Signed)
Dr jonnalagadda into see 

## 2012-03-10 ENCOUNTER — Inpatient Hospital Stay (HOSPITAL_COMMUNITY)
Admission: AD | Admit: 2012-03-10 | Discharge: 2012-03-23 | DRG: 430 | Disposition: A | Discharge status: Home / Self Care | Payer: BC Managed Care – PPO | Source: Ambulatory Visit | Attending: Psychiatry | Admitting: Psychiatry

## 2012-03-10 ENCOUNTER — Encounter (HOSPITAL_COMMUNITY): Payer: Self-pay | Admitting: *Deleted

## 2012-03-10 DIAGNOSIS — Z79899 Other long term (current) drug therapy: Secondary | ICD-10-CM

## 2012-03-10 DIAGNOSIS — F250 Schizoaffective disorder, bipolar type: Secondary | ICD-10-CM

## 2012-03-10 DIAGNOSIS — R45850 Homicidal ideations: Secondary | ICD-10-CM

## 2012-03-10 DIAGNOSIS — F259 Schizoaffective disorder, unspecified: Principal | ICD-10-CM | POA: Diagnosis present

## 2012-03-10 MED ORDER — MAGNESIUM HYDROXIDE 400 MG/5ML PO SUSP: 30.0000 mL | Freq: Every day | ORAL | Status: DC | PRN

## 2012-03-10 MED ORDER — ALUM & MAG HYDROXIDE-SIMETH 200-200-20 MG/5ML PO SUSP: 30.0000 mL | ORAL | Status: DC | PRN

## 2012-03-10 MED ORDER — LITHIUM CARBONATE ER 300 MG PO TBCR: 600.0000 mg | EXTENDED_RELEASE_TABLET | Freq: Every day | ORAL | Status: DC

## 2012-03-10 MED ORDER — ACETAMINOPHEN 325 MG PO TABS: 650.0000 mg | ORAL_TABLET | Freq: Four times a day (QID) | ORAL | Status: DC | PRN

## 2012-03-10 MED ORDER — ZIPRASIDONE HCL 60 MG PO CAPS
60.0000 mg | ORAL_CAPSULE | Freq: Every day | ORAL | Status: DC
  Administered 2012-03-11 – 2012-03-14 (×5): 60 mg via ORAL
  Filled 2012-03-10 (×7): qty 1

## 2012-03-10 NOTE — Tx Team (Signed)
Initial Interdisciplinary Treatment Plan  PATIENT STRENGTHS: (choose at least two) Motivation for treatment/growth Physical Health Supportive family/friends  PATIENT STRESSORS: Marital or family conflict   PROBLEM LIST: Problem List/Patient Goals Date to be addressed Date deferred Reason deferred Estimated date of resolution  Thought Disorder                                                       DISCHARGE CRITERIA:  Ability to meet basic life and health needs Adequate post-discharge living arrangements Improved stabilization in mood, thinking, and/or behavior Motivation to continue treatment in a less acute level of care Need for constant or close observation no longer present Reduction of life-threatening or endangering symptoms to within safe limits Safe-care adequate arrangements made Verbal commitment to aftercare and medication compliance  PRELIMINARY DISCHARGE PLAN: Attend PHP/IOP Return to previous living arrangement  PATIENT/FAMIILY INVOLVEMENT: This treatment plan has been presented to and reviewed with the patient, [name].  The patient has been given the opportunity to ask questions and make suggestions.  Arturo Morton 03/10/2012, 10:37 PM

## 2012-03-10 NOTE — ED Provider Notes (Signed)
8:02 AM  BP 100/62  Pulse 70  Temp 98.4 F (36.9 C) (Oral)  Resp 18  SpO2 99%  Patient sitting upright, notes that she is comfortable.  She was informed of placement status.  Gerhard Munch, MD 03/10/12 [phone]

## 2012-03-10 NOTE — BHH Counselor (Signed)
[name], assessment counselor at [facility] submitted Pt for admission to [facility]. Consulted with Akeysha McMurren, AC who confirmed there is not an available bed on [address]/acute psychiatric. Gave clinical report to Dr. Syed Arfeen who accepted Pt to [facility] [address] when a bed is available. Communicated this information to [name].  [name], LPC 
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
## 2012-03-10 NOTE — Progress Notes (Signed)
Pt has been accepted to [facility] by Dr. Dan Humphreys to Dr. Emmit Pomfret, bed 401-2. EDP has been notified and is in agreement with the disposition. RN made aware to call report. All support paper work has been completed and faxed to [facility] for review. Pt is under IVC and will therefore be transported via [employer]. No further needs identified at this time.

## 2012-03-10 NOTE — ED Provider Notes (Signed)
Patient has been accepted to [facility] by Dr. Dan Humphreys. When I go to talk to the patient to tell her she is being admitted she is disappointed that she's going to be [facility]. Patient appears to be speaking to other people quietly when she is not speaking to me, her lips and tongue are moving as if quietly speaking.   Devoria Albe, MD, Armando Gang   Ward Givens, MD 03/10/12 [phone]

## 2012-03-11 DIAGNOSIS — F250 Schizoaffective disorder, bipolar type: Secondary | ICD-10-CM | POA: Diagnosis present

## 2012-03-11 DIAGNOSIS — F259 Schizoaffective disorder, unspecified: Principal | ICD-10-CM

## 2012-03-11 MED ORDER — LITHIUM CARBONATE 300 MG PO CAPS
300.0000 mg | ORAL_CAPSULE | Freq: Every day | ORAL | Status: DC
  Administered 2012-03-12 – 2012-03-23 (×9): 300 mg via ORAL
  Filled 2012-03-11 (×15): qty 1

## 2012-03-11 MED ORDER — LITHIUM CARBONATE 300 MG PO CAPS
600.0000 mg | ORAL_CAPSULE | Freq: Every day | ORAL | Status: DC
  Filled 2012-03-11: qty 2

## 2012-03-11 MED ORDER — LITHIUM CARBONATE 300 MG PO CAPS
600.0000 mg | ORAL_CAPSULE | Freq: Every day | ORAL | Status: DC
  Administered 2012-03-11 – 2012-03-22 (×12): 600 mg via ORAL
  Filled 2012-03-11 (×13): qty 2

## 2012-03-11 NOTE — Discharge Planning (Signed)
Met with patient in Aftercare Planning Group and provided today's workbook based on theme of the day. She was very hard to awaken, but refused to leave the group to go to her room and lay down.  Patient agrees that she lives with her mother and will return there at discharge, and that her mother will pick her up.  Patient will follow up at [facility].  She kept falling asleep.  More will be done tomorrow to obtain information as patient becomes more rested.  Ambrose Mantle, LCSW 03/11/2012, 2:54 PM

## 2012-03-11 NOTE — Progress Notes (Signed)
D: Patient in room on approach.  Patient pleasant and calm but is paranoid during assessment.  Patient states, "I am bored, there is nothing to do." Patient states she cannot even listen to music. Patient became excited when writer told her about karaoke tonight and patient seem interested in attending.  Patient stated to writer, "I wanted to fix myself up and put make-up on put on underwear but I don't have it." Patient denies SI/HI and denies AVH however patient appears to be responding to internal stimuli.   A: Encouragement and support offered to pt by RN.  Scheduled medications administered per MD orders.  Staff to monitor Q 15 mins for safety. R: Patient pleasant and cooperative but still paranoid and appear to be responding to internal stimuli.  Pt attended karaoke group tonight and stated it was her first time so she did not do so well. Patient remains safe on the unit.

## 2012-03-11 NOTE — [facility] Suicide Risk Assessment (Signed)
Suicide Risk Assessment  Admission Assessment     Demographic factors:  Assessment Details Time of Assessment: Admission Information Obtained From: Patient Current Mental Status:  Current Mental Status:  (Currently denies SI) Loss Factors:  Loss Factors:  (family conflict) Historical Factors:  Historical Factors: Family history of mental illness or substance abuse Risk Reduction Factors:  Risk Reduction Factors: Sense of responsibility to family;Positive social support;Positive coping skills or problem solving skills  CLINICAL FACTORS:   Depression:   Anhedonia Delusional Insomnia Severe Currently Psychotic Previous Psychiatric Diagnoses and Treatments  COGNITIVE FEATURES THAT CONTRIBUTE TO RISK:  Loss of executive function    Diagnosis:  Axis I: Schizoaffective Disorder - Bipolar Type.   The patient was seen and reports the following:   ADL's: Intact.  Sleep: The patient reports to sleeping well last night.  Appetite: The patient reports that her appetite is good.   Mild>(1-10) >Severe  Hopelessness (1-10): 0  Depression (1-10): 0  Anxiety (1-10): 0   Suicidal Ideation: The patient denies any suicidal ideations today.  Plan: No  Intent: No  Means: No   Homicidal Ideation: The patient denies any homicidal ideations.  Plan: No  Intent: No.  Means: No   General Appearance/Behavior: The patient was guarded and minimally cooperative today with this provider. She answered all questions in one word answers. Eye Contact: Good.  Speech: Appropriate in rate and volume today with no pressuring noted but with little spontaneous speech.  Motor Behavior: wnl.  Level of Consciousness: Alert and Oriented x 3.  Mental Status: Alert and Oriented x 3.  Mood: Appears moderately depressed today.  Affect: Appears moderately constricted today.  Anxiety Level: No anxiety reported.  Thought Process: Auditory hallucinations likely with paranoid delusions. Thought Content: The patient  denies any visual hallucinations today. She appears to be responding to internal stimuli as well as paranoid delusions.  Perception:  Auditory hallucinations likely with paranoid delusions.  Judgment: Poor..  Insight: Poor. Cognition: Oriented to person, place and time.   Current Medications:    . lithium carbonate  300 mg Oral QAC breakfast  . lithium carbonate  600 mg Oral QHS  . ziprasidone  60 mg Oral QHS  . DISCONTD: lithium carbonate  600 mg Oral QHS  . DISCONTD: lithium carbonate  600 mg Oral Q2000   Review of Systems:  Neurological: The patient denies any headaches today. She denies any seizures or dizziness.  G.I.: The patient denies any constipation or G.I. Upset today.  Musculoskeletal: The patient denies any musculoskeletal issues.  Time was spent today discussing with the patient her current symptoms. The patient states that she was admitted due to her mother believing she was having paranoid thoughts.  The patient was guarded and would not provide much information today.  She states that she is sleeping well and reports a good appetite.  She denies any depressive symptoms but appears to be experiencing moderate feelings of sadness, anhedonia and depressed mood.  She denies any anxiety symptoms today and denies any suicidal or homicidal ideations.  Although the patient denies any auditory or visual hallucinations or delusional thinking, she appears to be responding to internal stimuli and has significant paranoid delusions.  She denies any use of alcohol or illicit drugs.today.  Treatment Plan Summary:  1. Daily contact with patient to assess and evaluate symptoms and progress in treatment.  2. Medication management  3. The patient will deny suicidal ideations or homicidal ideations for 48 hours prior to discharge and have a  depression and anxiety rating of 3 or less. The patient will also deny any auditory or visual hallucinations or delusional thinking.  4. The patient will  deny any symptoms of substance withdrawal at time of discharge.   Plan:  1. Will restart the medication Lithium Carbonate but at the increased dosage of 300 mgs po q am and 600 mgs po qhs for mood stabilization. 2. Will restart the medication Geodon at 60 mgs po qhs for psychosis and further mood stabilization. 3. Laboratory studies reviewed.  4. Will order a TSH, Free T3, Free T4 and serum Lithium level for Sunday, March 14, 2012. 5. Will continue the patient on her IVC until further stabilized. 6. Will continue to monitor.   SUICIDE RISK:   Moderate:  Frequent suicidal ideation with limited intensity, and duration, some specificity in terms of plans, no associated intent, good self-control, limited dysphoria/symptomatology, some risk factors present, and identifiable protective factors, including available and accessible social support.  Manar Smalling 03/11/2012, 9:51 PM

## 2012-03-11 NOTE — Progress Notes (Signed)
Adult Comprehensive Assessment  Patient ID: APRYLL HINKLE, female   DOB: Jun 20, 1986, 26 y.o.   MRN: [id number]  Information Source: Information source: Patient  Current Stressors:  Educational / Learning stressors: Completed 1 yr. college Employment / Job issues: Unemployed Family Relationships: Off and On with mother.  Currently a sister has moved in and expects too much of patient. Pt states it used to be calm. Financial / Lack of resources (include bankruptcy): none Housing / Lack of housing: Lives with mother.  1 sister is there presently. Physical health (include injuries & life threatening diseases): Generally good health Social relationships: Gets along with others well when she is not paranoid.  She wants to feel good about herself and stated she needs her makeup and decent clothes and this will make her feel better about herself. Substance abuse: Pt denies. Bereavement / Loss: Pt's father died in 26-Jan-2008.  She was very close to him.  She disclosed that since there is no man in the house, she is more afraid someone will come in and harm them.  Living/Environment/Situation:  Living Arrangements: Parent Living conditions (as described by patient or guardian): Calm most of the time. How long has patient lived in current situation?: All her life, 26 yrs.  What is atmosphere in current home: Dangerous (Pt is paranoid and preceives she is not safe.)  Family History:  Marital status: Single Does patient have children?: No  Childhood History:  By whom was/is the patient raised?: Both parents Description of patient's relationship with caregiver when they were a child: Good Patient's description of current relationship with people who raised him/her: Mostly good relationship with mother Does patient have siblings?: Yes Number of Siblings: 10  Description of patient's current relationship with siblings: Pt states 6 siblings were her moms and the other's were stepsiblings.  Pt is the youngest  and all siblings are much older. Did patient suffer any verbal/emotional/physical/sexual abuse as a child?: No Did patient suffer from severe childhood neglect?: No Has patient ever been sexually abused/assaulted/raped as an adolescent or adult?: No Was the patient ever a victim of a crime or a disaster?: No Witnessed domestic violence?: No Has patient been effected by domestic violence as an adult?: No  Education:  Highest grade of school patient has completed: Graduated HS and attended 1 yyr at [employer] in [place]. Studing design on the computer.   Currently a student?: No Learning disability?: No  Employment/Work Situation:   Employment situation: Unemployed Patient's job has been impacted by current illness: Yes Describe how patient's job has been implacted: If medications are not working well. What is the longest time patient has a held a job?: 4 years.  [employer].  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 Where was the patient employed at that time?: [employer]. Has patient ever been in the [employer]?: No Has patient ever served in combat?: No  Financial Resources:   Financial resources: No income Does patient have a Lawyer or guardian?: No  Alcohol/Substance Abuse:   What has been your use of drugs/alcohol within the last 12 months?: denies Alcohol/Substance Abuse Treatment Hx: Denies past history Has alcohol/substance abuse ever caused legal problems?: No  Social Support System:   Conservation officer, nature Support System: Good Describe Community Support System: [place], Dr. Ladona Ridgel.  Mother and siblings.   Type of faith/religion: [employer]ri[employer] Knuckles  How does patient's faith help to cope with current illness?: Prays.  Attends [employer] [address] and the [place].  Pt stated she needed to go to church with younger people.  Leisure/Recreation:   Leisure and Hobbies: Listens to music, goes out sometimes.  Pt feels she is left out due to not  being on a social network, but knows it is a good decision not to get on face book.  She wishes her friends would call on the phone.   Strengths/Needs:   What things does the patient do well?: Creative, caring person.  Artistic wants to pursue further edu. in Estate agent on the computer. In what areas does patient struggle / problems for patient: Paranoia.  Can't trust anyone.  She wants to look good and feel good about herself and wants to be able to trust others.  She does not want a therapist currently because she could not trust .  Discharge Plan:   Does 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 to transportation?: Yes Will patient be returning to same living situation after discharge?: Yes Currently receiving community mental health services: Yes (From Whom) (Pt states it is time for her Lithium level to be checked.) If no, would patient like referral for services when discharged?: Yes (What county?) (Pt needs appointment at [facility] with Dr. Ladona Ridgel.) Does patient have financial barriers related to discharge medications?: No  Summary/Recommendations:   Summary and Recommendations (to be completed by the evaluator): Pt was admitted due to extreme paranoia and hypervigelence with no sleep, lying in her bed with a butcher knife for fear someone was going to come in and hurt them.  She stated she feels someone has to protect them.  Pt was responding to auditory and visual hallucinations but would not disclose.  Pt has been [facility] at [facility]  with Lithium  and sees Dr. Ladona Ridgel.  She wants to feel normal and takes pride in her appearance.  Pt wants to continue  her college education .  Recommend:  Crisis Stabilizaiton, Psychiatric eval., gfroup therapy, psycho/edu groups, and  case management.  Marni Griffon C. 03/11/2012

## 2012-03-11 NOTE — Tx Team (Signed)
Interdisciplinary Treatment Plan Update (Adult)  Date:  03/11/2012  Time Reviewed:  10:15AM-11:15AM  Progress in Treatment: Attending groups:  Yes, even though new Participating in groups:    Not much, too sleepy Taking medication as prescribed:  Yes   Tolerating medication:   Yes Family/Significant other contact made:  No, but did sign consent for [place] to talk with her mother   Patient understands diagnosis:   New Discussing patient identified problems/goals with staff:   Yes Medical problems stabilized or resolved:   Yes Denies suicidal/homicidal ideation:  Denies Issues/concerns per patient self-inventory:   None Other:    New problem(s) identified: Yes, Describe:  patient describes all the symptoms currently which are reported to have caused for her to be hospitalized in the first place.  Reason for Continuation of Hospitalization: Hallucinations Medication stabilization Other; describe problems sleeping, paranoia, denying symptoms  Interventions implemented related to continuation of hospitalization:  Medication monitoring and adjustment, safety checks Q15 min., suicide risk assessment, group therapy, psychoeducation, collateral contact, aftercare planning, ongoing physician assessments, medication education  Additional comments:  Not applicable  Estimated length of stay:  3-5 days  Discharge Plan:  Return to live with mother, follow up with [facility]  New goal(s):  Not applicable  Review of initial/current patient goals per problem list:   1.  Goal(s):  Medication stabilization  Met:  No  Target date:  By Discharge   As evidenced by:  Ongoing, patient is new  2.  Goal(s):  Return sleep to normal pattern of at least 6+ hours per night.  Met:  No  Target date:  By Discharge   As evidenced by:  Patient is new  3.  Goal(s):  Reduce auditory/visual hallucinations to baseline.  Met:  No  Target date:  By Discharge   As evidenced by:  Patient denies, but was  admitted yesterday with these symptoms  4.  Goal(s):  Decrease paranoia to baseline and to manageable level, where she can discharge without feeling threatened or feeling a need to threaten others.  Met:  No  Target date:  By Discharge   As evidenced by:  Feels very threatened, and refuses to go to her room, wants to stay in the dayroom to sleep throughout today.  RN recommended making her a "Do Not Admit" room  Attendees: Patient:  [name]  03/11/2012 10:15AM-11:15AM  Family:     Physician:  Dr. Harvie Heck Readling 03/11/2012 10:15AM-11:15AM  Nursing:   Nestor Ramp, RN 03/11/2012 10:15AM -11:15AM   Case Manager:  Ambrose Mantle, LCSW 03/11/2012 10:15AM-11:15AM  Counselor:  Marni Griffon, LCAS 03/11/2012 10:15AM-11:15AM  Other:      Other:      Other:      Other:       Scribe for Treatment Team:   Sarina Ser, 03/11/2012, 10:15AM-11:15AM

## 2012-03-11 NOTE — Progress Notes (Signed)
D: Pt denies SI and auditory/visual hallucinations, however, pt does seem to be responding to internal stimuli at times and talking to herself; pt has a depressed and suspicious mood with a preoccupied affect; pt also seems to be having some paranoia in that she is guarded around others and seldomly enters her own room preferring to stay in the day room; pt reported no depression or hopelessness and no problems with sleeping, appetite, energy level or ability to pay attention A: Pt given emotional support from RN; pt encouraged to come to staff with questions or concerns; pt's medication routine continued and pt's plan of care reviewed R: Pt remains cooperative but suspicious and paranoid; will continue to monitor

## 2012-03-11 NOTE — Progress Notes (Signed)
[facility] Group Notes:  (Counselor/Nursing/MHT/Case Management/Adjunct)  03/11/2012  10:30  AM  Type of Therapy: Group Therapy   Participation Level: Did not attend.  Pt was not able to be aroused for group.   Marni Griffon C 03/11/2012  10:30 AM

## 2012-03-11 NOTE — H&P (Signed)
Psychiatric Admission Assessment Adult  Patient Identification:  [name]  Date of Evaluation:  03/11/2012  Chief Complaint:  SCHIZOAFFECTIVE DISORDER  History of Present Illness: This is a 26 year old African-American female, admitted to [facility] from the [facility] ED with complaints of paranoid behavior per patient's mother. Patient reports, "I came to this hospital because my mama said that I was paranoid.I would like to know why she thought so. I feel like something is going wrong, when I think like that, it is possible that it will happen. Like I have this pant, I am wearing it. But I feel weird because I don't have the proper under garment you know. I want to go home, but my mama does not want me to do just that. It is not even a long drive, about 10 minutes at most. She said that I should take my Lithium, but the Lithium belongs to me, does she want it to herself or something? Well, I ate breakfast. I have my clothes on".  Mood Symptoms:  Hypomania/Mania, Mood Swings, Past 2 Weeks,  Depression Symptoms:  difficulty concentrating,  (Hypo) Manic Symptoms:  Distractibility,  Anxiety Symptoms:  Excessive Worry,  Psychotic Symptoms:  Hallucinations: Patient appear to be responding some internal stimuli.  PTSD Symptoms: Had a traumatic exposure:  Denies  Past Psychiatric History: Diagnosis:Schizoaffective disorder, bipolar type  Hospitalizations: [facility]  Outpatient Care: None reported  Substance Abuse Care: None reported  Self-Mutilation: Denies report  Suicidal Attempts: Denies attempts  Violent Behaviors: none reported   Past Medical History:   Past Medical History  Diagnosis Date  . Bipolar 1 disorder     Allergies:   Allergies  Allergen Reactions  . Quetiapine Other (See Comments)    Patient did not say specific reaction just has a "reaction"    PTA Medications: Prescriptions prior to admission  Medication Sig Dispense Refill  . lithium 600 MG capsule Take  600 mg by mouth every evening.       . ziprasidone (GEODON) 20 MG capsule Take 60 mg by mouth at bedtime.         Substance Abuse History in the last 12 months: Substance Age of 1st Use Last Use Amount Specific Type  Nicotine "I don't use drugs, smoke and or drink alcohol".     Alcohol      Cannabis      Opiates      Cocaine      Methamphetamines      LSD      Ecstasy      Benzodiazepines      Caffeine      Inhalants      Others:                         Consequences of Substance Abuse: Medical Consequences:  Liver damage Legal Consequences:  Arrests, jail time Family Consequences:  Family discord  Social History: Current Place of Residence: [place] of Birth: [place]  Family Members: "My mother"  Marital Status:  Single  Children: 0  Sons: 0  Daughters: 0  Relationships: "I'm single"  Education:  [employer] Problems/Performance:None reported  Religious Beliefs/Practices: None reported  History of Abuse (Emotional/Phsycial/Sexual): None reported  Occupational Experiences: English as a second language teacher History:  None.  Legal History: None reported  Hobbies/Interests: None reported  Family History:  No family history on file.  Mental Status Examination/Evaluation: Objective:  Appearance: Disheveled  Eye Contact::  Poor  Speech:  Garbled, disorganized  Volume:  Normal  Mood:  Dysphoric  Affect:  Blunt  Thought Process:  Disorganized and Tangential  Orientation:  Full  Thought Content:  Paranoid Ideation  Suicidal Thoughts:  No  Homicidal Thoughts:  No  Memory:  Immediate;   Good Recent;   Good Remote;   Good  Judgement:  Impaired  Insight:  Lacking  Psychomotor Activity:  Normal  Concentration:  Fair  Recall:  Good  Akathisia:  No  Handed:  Right  AIMS (if indicated):     Assets:  Desire for Improvement  Sleep:  Number of Hours: 1     Laboratory/X-Ray: None Psychological Evaluation(s)      Assessment:    AXIS I:   Schizoaffective Disorder, bipolar type. AXIS II:  Deferred AXIS III:   Past Medical History  Diagnosis Date  . Bipolar 1 disorder    AXIS IV:  other psychosocial or environmental problems AXIS V:  21-30 behavior considerably influenced by delusions or hallucinations OR serious impairment in judgment, communication OR inability to function in almost all areas  Treatment Plan/Recommendations: Admit for safety and stabilization. Review and reinstate any pertinent home medications for other health issues. Start Lithium Carbonate 300 mg daily, 600 mg Q bedtime. Obtain Lithium levels.  Treatment Plan Summary: Daily contact with patient to assess and evaluate symptoms and progress in treatment Medication management  Current Medications:  Current Facility-Administered Medications  Medication Dose Route Frequency Provider Last Rate Last Dose  . acetaminophen (TYLENOL) tablet 650 mg  650 mg Oral Q6H PRN Jorje Guild, PA-C      . alum & mag hydroxide-simeth (MAALOX/MYLANTA) 200-200-20 MG/5ML suspension 30 mL  30 mL Oral Q4H PRN Jorje Guild, PA-C      . lithium carbonate capsule 300 mg  300 mg Oral QAC breakfast Sanjuana Kava, NP      . lithium carbonate capsule 600 mg  600 mg Oral QHS Jorje Guild, PA-C      . lithium carbonate capsule 600 mg  600 mg Oral Q2000 Sanjuana Kava, NP      . magnesium hydroxide (MILK OF MAGNESIA) suspension 30 mL  30 mL Oral Daily PRN Jorje Guild, PA-C      . ziprasidone (GEODON) capsule 60 mg  60 mg Oral QHS Jorje Guild, PA-C   60 mg at 03/11/12 0005  . DISCONTD: lithium carbonate (LITHOBID) CR tablet 600 mg  600 mg Oral QHS Jorje Guild, PA-C       Facility-Administered Medications Ordered in Other Encounters  Medication Dose Route Frequency Provider Last Rate Last Dose  . DISCONTD: acetaminophen (TYLENOL) tablet 650 mg  650 mg Oral Q4H PRN Tatyana A Kirichenko, PA      . DISCONTD: ibuprofen (ADVIL,MOTRIN) tablet 600 mg  600 mg Oral Q8H PRN Tatyana A Kirichenko, PA      .  DISCONTD: lithium carbonate capsule 600 mg  600 mg Oral QAC supper Forbes Cellar, MD   600 mg at 03/10/12 1702  . DISCONTD: LORazepam (ATIVAN) tablet 1 mg  1 mg Oral Q8H PRN Tatyana A Kirichenko, PA      . DISCONTD: ondansetron (ZOFRAN) tablet 4 mg  4 mg Oral Q8H PRN Tatyana A Kirichenko, PA      . DISCONTD: zolpidem (AMBIEN) tablet 5 mg  5 mg Oral QHS PRN Tatyana A Kirichenko, PA        Observation Level/Precautions:  Q 15 minute checks for safety  Laboratory:  Q 15 minute  checks for safety  Psychotherapy:  Group  Medications: See lists   Routine PRN Medications:  Yes  Consultations:  None indicated at this time  Discharge Concerns:  Safety, drug adherance  Other:     Armandina Stammer I 7/18/20136:00 PM

## 2012-03-11 NOTE — Progress Notes (Signed)
Psychoeducational Group Note  Date:  03/11/2012 Time:  1100  Group Topic/Focus:  Self Esteem Action Plan:   The focus of this group is to help patients create a plan to continue to build self-esteem after discharge.  Participation Level:  Did not attend  Participation Quality:    Affect:    Cognitive:    Insight:    Engagement in Group:    Additional Comments:  Pt was confused and said she was coming back to group. Pt did return for the last 10 min. Did not participate  Lollie Gunner, Genia Plants 03/11/2012, 2:12 PM

## 2012-03-11 NOTE — Progress Notes (Signed)
[facility] Group Notes:  (Counselor/Nursing/MHT/Case Management/Adjunct)  03/11/2012  2:15 PM  Type of Therapy: Group Therapy   Participation Level: Minimal   Participation Quality: Minimal  Affect: Blunted   Cognitive: oriented,    Insight: minimal  Engagement in Group: Limited  Modes of Intervention: Clarification, Education, Problem-solving, Socialization, Activity, Encouragement and Support   Summary of Progress/Problems: Pt actively participated in group by listening attentively and self disclosing.  Therapist addressed the topic of Creativity and Humor.  Therapist asked patients to verbalize a time in their lives that they were very happy.  Therapist encouraged patients to remember happy times when they are feeling stressed. Pt identified designing on the computer as a happy activity. Therapist emphasized that maintaining a good sense of humor and laughter are important to maintaining a balanced lifestyle. Therapist praised patients for being creative and asked them to tell as story as evidence of this.  Therapist began a story and each patient added to the story for several rounds. Pts speech was somewhat garbled but she was able to add appropriately to the story.   Therapist offered support and encouragement.  Some progress noted.  Intervention effective.         Marni Griffon C 03/11/2012, 2:15 PM

## 2012-03-12 DIAGNOSIS — R45850 Homicidal ideations: Secondary | ICD-10-CM

## 2012-03-12 LAB — LITHIUM LEVEL: Lithium Lvl: 0.5 mEq/L — ABNORMAL LOW (ref 0.80–1.40)

## 2012-03-12 NOTE — Progress Notes (Addendum)
[facility] MD Progress Note  03/12/2012 12:25 PM  Diagnosis:  Axis I: Schizo affective Disorder, bipolar subtype  ADL's:  Impaired  Sleep: Poor  Appetite:  Poor  Suicidal Ideation:  Plan:  No Intent:  No Means:  No Homicidal Ideation:  Plan:  To hurt Mom with a butcher knife Intent:  To hurt Mom Means:  Butcher knife  AEB (as evidenced by):  Mental Status Examination/Evaluation: Objective:  Appearance: Disheveled  Eye Contact::  Fair  Speech:  Pressured  Volume:  Decreased  Mood:  Depressed, Dysphoric and Irritable  Affect:  Non-Congruent and Labile  Thought Process:  Disorganized, Irrelevant and Loose  Orientation:  Other:  place and person  Thought Content:  Delusions and Hallucinations: Patient denies but seems to be responding to internal stimuli  Suicidal Thoughts:  No  Homicidal Thoughts:  No  Memory:  Immediate;   Fair Recent;   Fair Remote;   Fair  Judgement:  Poor  Insight:  Lacking  Psychomotor Activity:  Mannerisms  Concentration:  Poor  Recall:  Fair  Akathisia:  No    AIMS (if indicated):   Score is 0  Assets:  Housing Physical Health Social Support  Sleep:  Number of Hours: 6    Vital Signs:Blood pressure 100/68, pulse 101, temperature 98.9 F (37.2 C), resp. rate 18, height 5\' 2"  (1.575 m), weight 134 lb 8 oz (61.009 kg), last menstrual period 02/04/2012. Current Medications: Current Facility-Administered Medications  Medication Dose Route Frequency Provider Last Rate Last Dose  . acetaminophen (TYLENOL) tablet 650 mg  650 mg Oral Q6H PRN Jorje Guild, PA-C      . alum & mag hydroxide-simeth (MAALOX/MYLANTA) 200-200-20 MG/5ML suspension 30 mL  30 mL Oral Q4H PRN Jorje Guild, PA-C      . lithium carbonate capsule 300 mg  300 mg Oral QAC breakfast Curlene Labrum Readling, MD   300 mg at 03/12/12 0654  . lithium carbonate capsule 600 mg  600 mg Oral QHS Curlene Labrum Readling, MD   600 mg at 03/11/12 2205  . magnesium hydroxide (MILK OF MAGNESIA) suspension 30 mL  30 mL  Oral Daily PRN Jorje Guild, PA-C      . ziprasidone (GEODON) capsule 60 mg  60 mg Oral QHS Curlene Labrum Readling, MD   60 mg at 03/11/12 2205  . DISCONTD: lithium carbonate capsule 600 mg  600 mg Oral Q2000 Sanjuana Kava, NP        Lab Results: No results found for this or any previous visit (from the past 48 hour(s)).  Physical Findings: Review of systems negative for any cardiac issues , GI issues, neurological issues today AIMS: Facial and Oral Movements Muscles of Facial Expression: None, normal Lips and Perioral Area: None, normal Jaw: None, normal Tongue: None, normal,Extremity Movements Upper (arms, wrists, hands, fingers): None, normal Lower (legs, knees, ankles, toes): None, normal, Trunk Movements Neck, shoulders, hips: None, normal, Overall Severity Severity of abnormal movements (highest score from questions above): None, normal Incapacitation due to abnormal movements: None, normal, Dental Status Current problems with teeth and/or dentures?: No Does patient usually wear dentures?: No  CIWA:    COWS:     Treatment Plan Summary: Daily contact with patient to assess and evaluate symptoms and progress in treatment Medication management Patient needs to have stable mood, no longer have any homicidal thoughts in order to be discharged home  Plan: Will increase Geodon to 80 mg at bedtime to help for mood stabilization Continue lithium carbonate 300  mg in the morning and 600 mg at bedtime for mood stabilization Will order lithium level to be checked Sunday morning Patient to continue to participate in groups  [facility] 03/12/2012, 12:25 PM

## 2012-03-12 NOTE — Discharge Planning (Signed)
Met with patient in Aftercare Planning Group and provided today's workbook based on theme of the day. She continues to display some thought blocking, but is a little more responsive.  No case management needs noted today.  Case Manager made follow-up appointment at [facility] for 03/18/12 at 8AM with Dr. Ladona Ridgel.  Ambrose Mantle, LCSW 03/12/2012, 1:11 PM

## 2012-03-12 NOTE — Progress Notes (Addendum)
03/12/2012         Time: 0930       Group Topic/Focus: The focus of the group is on enhancing the patients' ability to cope with stressors by understanding what coping is, why it is important, the negative effects of stress and developing healthier coping skills. Patients practice [place] and discuss how exercise can be used as a healthy coping strategy.  Participation Level: Minimal  Participation Quality: Attentive and Resistant  Affect: Blunted  Cognitive: Alert   Additional Comments: Patient childlike in speech, talked about feeling more comfortable today because she got to talk to her family on the phone. Patient walked out of group half way through, didn't return, reason unknown.  Daje Stark 03/12/2012 11:55 AM

## 2012-03-12 NOTE — Progress Notes (Signed)
Psychoeducational Group Note  Date:  03/12/2012 Time:  1100  Group Topic/Focus:  Relapse Prevention Planning:   The focus of this group is to define relapse and discuss the need for planning to combat relapse.  Participation Level:  Did Not Attend  Participation Quality:    Affect:    Cognitive:    Insight:    Engagement in Group:    Additional Comments:  Pt just refused, it wasn't any reason why pt couldn't attend.  Isla Pence M 03/12/2012, 1:35 PM

## 2012-03-12 NOTE — Progress Notes (Signed)
D: Pt denies SI/HI and AVH; pt has a apprehensive mood and a preoccupied affect; pt has minimal interaction in the milieu and can be evasive when interacting with staff members; pt reports her depression is a 0 and her hopelessness is a 1 (1 low/10 high) A: Pt given emotional support from RN; pt encouraged to come to staff with concerns and/or questions; pt's medication routine continued; pt's orders and plan of care reviewed R: Pt remains appropriate and somewhat cooperative; will continue to monitor for safety

## 2012-03-12 NOTE — Progress Notes (Signed)
[facility] Group Notes:  (Counselor/Nursing/MHT/Case Management/Adjunct)  03/12/2012  2:30  PM  Type of Therapy: Group Therapy   Participation Level: Minimal   Participation Quality: Limited  Affect: Blunted  Cognitive: oriented, alert   Insight: Poor  Engagement in Group: Limited  Modes of Intervention: Clarification, Education, Problem-solving, Socialization, Activity, Encouragement and Support   Summary of Progress/Problems: Pt participated in group by listening attentively and self disclosing.  After a brief check-in, therapist introduced the topic of Feelings Around Relapse.  Therapist guided patients to explore emotions they have related to recovery.  Therapist prompted patients to answer the following questions: Which emotions come before relapse?  Which emotions result after the relapse:  Which emotions are related to their recovery; and Ways to respond to other people's emotional reaction to their relapse and recovery.  Patient was minimally engaged in the process and reported Paranoia escalated.  She stated she needed to stay calm, cope with stress and take her medications as prescribed in order to maintain recovery.  Pt participated in the Positive Affirmation Activity, by giving and receiving positive affirmations.  Therapist offered support and encouragement.  Some progress noted.  Intervention effective.         Marni Griffon C 03/12/2012  2:30 PM

## 2012-03-13 NOTE — Progress Notes (Signed)
D   Pt isolated in her room most of the morning and did not attend morning group  She is pleasant on approach and smiles spontaneously   She has had no complaints  Her thinking is logical and coherent   She did attend counselor group and was appropriate  She has been sitting in the dayroom watching a movie with several other pts   A   Verbal support given  Discuss medications with pt   Q 15 min checks R   Pt safe at present

## 2012-03-13 NOTE — Progress Notes (Signed)
Psychoeducational Group Note  Date:  03/13/2012 Time:  2000  Group Topic/Focus:  Wrap-Up Group:   The focus of this group is to help patients review their daily goal of treatment and discuss progress on daily workbooks.  Participation Level:  None  Participation Quality:  Appropriate  Affect:  Appropriate  Cognitive:  Appropriate  Insight:  None  Engagement in Group:  None  Additional Comments:  Pt attended wrap-up- group but didn't participated   Wm Sahagun A 03/13/2012, 2:06 AM

## 2012-03-13 NOTE — Progress Notes (Signed)
Psychoeducational Group Note  Date:  03/13/2012 Time:  0945 am  Group Topic/Focus:  Identifying Needs:   The focus of this group is to help patients identify their personal needs that have been historically problematic and identify healthy behaviors to address their needs.  Participation Level:  Did Not Attend    Andrena Mews 03/13/2012,1030

## 2012-03-13 NOTE — Progress Notes (Signed)
Psychoeducational Group Note  Date:  03/13/2012 Time:  0945 am  Group Topic/Focus:  Identifying Needs:   The focus of this group is to help patients identify their personal needs that have been historically problematic and identify healthy behaviors to address their needs.  Participation Level:  Did Not Attend   Andrena Mews 03/13/2012,10:14 AM

## 2012-03-13 NOTE — Progress Notes (Signed)
Patient ID: [name], female   DOB: March 06, 1986, 26 y.o.   MRN: [id number] [facility] MD Progress Note  03/13/2012 6:07 PM  Diagnosis:  Axis I: Schizo affective Disorder, bipolar subtype  ADL's:  Impaired  Sleep: Poor  Appetite:  Poor  Suicidal Ideation:  Plan:  No Intent:  No Means:  No Homicidal Ideation:   None today   AEB (as evidenced by):  Mental Status Examination/Evaluation: Objective:  Appearance: Disheveled  Eye Contact::  Fair  Speech:  Pressured  Volume:  Decreased  Mood:  Depressed, Dysphoric and Irritable  Affect:  Non-Congruent and Labile  Thought Process:  Disorganized, Irrelevant and Loose  Orientation:  Other:  place and person  Thought Content:  Delusions and Hallucinations: Patient denies but seems to be responding to internal stimuli  Suicidal Thoughts:  No  Homicidal Thoughts:  No  Memory:  Immediate;   Fair Recent;   Fair Remote;   Fair  Judgement:  Poor  Insight:  Lacking  Psychomotor Activity:  Mannerisms  Concentration:  Poor  Recall:  poor  Akathisia:  No    AIMS (if indicated):   Score is 0  Assets:  Housing Physical Health Social Support  Sleep:  Number of Hours: 5.25    Vital Signs:Blood pressure 106/75, pulse 81, temperature 98.1 F (36.7 C), resp. rate 16, height 5\' 2"  (1.575 m), weight 61.009 kg (134 lb 8 oz), last menstrual period 02/04/2012. Current Medications: Current Facility-Administered Medications  Medication Dose Route Frequency Provider Last Rate Last Dose  . acetaminophen (TYLENOL) tablet 650 mg  650 mg Oral Q6H PRN Jorje Guild, PA-C      . alum & mag hydroxide-simeth (MAALOX/MYLANTA) 200-200-20 MG/5ML suspension 30 mL  30 mL Oral Q4H PRN Jorje Guild, PA-C      . lithium carbonate capsule 300 mg  300 mg Oral QAC breakfast Curlene Labrum Readling, MD   300 mg at 03/12/12 0654  . lithium carbonate capsule 600 mg  600 mg Oral QHS Curlene Labrum Readling, MD   600 mg at 03/12/12 2225  . magnesium hydroxide (MILK OF MAGNESIA) suspension 30 mL   30 mL Oral Daily PRN Jorje Guild, PA-C      . ziprasidone (GEODON) capsule 60 mg  60 mg Oral QHS Ronny Bacon, MD   60 mg at 03/12/12 2226    Lab Results:  Results for orders placed during the hospital encounter of 03/10/12 (from the past 48 hour(s))  LITHIUM LEVEL     Status: Abnormal   Collection Time   03/12/12  7:36 PM      Component Value Range Comment   Lithium Lvl 0.50 (*) 0.80 - 1.40 mEq/L     Physical Findings: Review of systems negative for any cardiac issues , GI issues, neurological issues today AIMS: Facial and Oral Movements Muscles of Facial Expression: None, normal Lips and Perioral Area: None, normal Jaw: None, normal Tongue: None, normal,Extremity Movements Upper (arms, wrists, hands, fingers): None, normal Lower (legs, knees, ankles, toes): None, normal, Trunk Movements Neck, shoulders, hips: None, normal, Overall Severity Severity of abnormal movements (highest score from questions above): None, normal Incapacitation due to abnormal movements: None, normal, Dental Status Current problems with teeth and/or dentures?: No Does patient usually wear dentures?: No  CIWA:    COWS:     Treatment Plan Summary: Daily contact with patient to assess and evaluate symptoms and progress in treatment Medication management Patient needs to have stable mood, no longer have any homicidal thoughts now.  Focused on discharge now. Still disorganized   Plan: Will continue Geodon to 80 mg at bedtime to help for mood stabilization Continue lithium carbonate 300 mg in the morning and 600 mg at bedtime for mood stabilization Will order lithium level to be checked Sunday morning Patient to continue to participate in groups  Wonda Cerise 03/13/2012, 6:07 PM

## 2012-03-13 NOTE — Progress Notes (Signed)
D) PATIENT ATTENDED GROUP. STATED THAT SHE SPOKE WITH HER FAMILY HAD HAD A GOOD TALK WITH THEM. WANTS TO BE WELL ENOUGH TO GO HOME. PATIENT CONCERNED ABOUT IF MD SAID THAT SHE WOULD BE GOING HOME SOON. A) TOOK MEDICATIONS WITHOUT PROMPTING HER TO. SUPPORT AND ENCOURAGEMENT GIVEN. Q15 MINUTE SAFETY CHECKS. D) PT REMAINED SAFE ON UNIT.

## 2012-03-13 NOTE — Progress Notes (Signed)
Psychoeducational Group Note  Date:  03/13/2012 Time:  1515  Group Topic/Focus:  Healthy Communication:   The focus of this group is to discuss communication, barriers to communication, as well as healthy ways to communicate with others.  Participation Level:  Active  Participation Quality:  Appropriate and Attentive  Affect:  Appropriate  Cognitive:  Appropriate  Insight:  Good  Engagement in Group:  Good  Additional Comments: pt participated and processed in group.  Marquis Lunch, [place] 03/13/2012, 6:44 PM

## 2012-03-13 NOTE — Progress Notes (Signed)
Patient ID: [name], female   DOB: 1986/08/21, 26 y.o.   MRN: [id number]   [facility] Group Notes:  (Counselor/Nursing/MHT/Case Management/Adjunct)  03/13/2012 11 AM  Type of Therapy:  Aftercare Planning, Group Therapy, Dance/Movement Therapy   Participation Level:  Active  Participation Quality:  Appropriate  Affect:  Appropriate  Cognitive:  Appropriate  Insight:  Good  Engagement in Group:  Good  Engagement in Therapy:  Good  Modes of Intervention:  Clarification, Problem-solving, Role-play, Socialization and Support  Summary of Progress/Problems: After Care: Pt. attended and participated in aftercare planning group. Pt. verbally accepted information on suicide prevention, warning signs to look for with suicide and crisis line numbers to use. Pt. shared that she was feeling good today.  Counseling:  Therapist and group members discussed positive and negative coping skills. Group members shared how to stay motivated during difficult times and the difference between our wants and our needs. Therapist encouraged group members to journal their goals, motivations, and changes they would like to make. Pt. shared that positive coping skills include praying, finding people with similar likes and dislikes, and having a positive outlook.    Cassidi Long 03/13/2012. 11:31 AM

## 2012-03-13 NOTE — Progress Notes (Signed)
[facility] Group Notes:  (Counselor/Nursing/MHT/Case Management/Adjunct)  03/13/2012 10:12 PM  Type of Therapy:  Psychoeducational Skills  Participation Level:  Active  Participation Quality:  Appropriate  Affect:  Anxious and Appropriate  Cognitive:  Appropriate  Insight:  Good  Engagement in Group:  Good  Engagement in Therapy:  Good  Modes of Intervention:  Support  Summary of Progress/Problems: Pt attended the evening group and was visibly anxious in her speech and body language. Pt stated that her goal was to be well enough this upcoming week to be able to go home.   Christ Kick 03/13/2012, 10:12 PM

## 2012-03-14 NOTE — Progress Notes (Signed)
Psychoeducational Group Note  Date:  03/14/2012 Time:  0945 am  Group Topic/Focus:  Making Healthy Choices:   The focus of this group is to help patients identify negative/unhealthy choices they were using prior to admission and identify positive/healthier coping strategies to replace them upon discharge.  Participation Level:  Minimal  Participation Quality:  Attentive  Affect:  Anxious  Cognitive:  Alert  Insight:  Limited  Engagement in Group:  Limited  Additional Comments:  Pt was attentive during group responded to the content appropriately but did not share  [name] 03/14/2012, 10:29 AM

## 2012-03-14 NOTE — Progress Notes (Signed)
Patient ID: [name], female   DOB: February 05, 1986, 26 y.o.   MRN: [id number]   [facility] Group Notes:  (Counselor/Nursing/MHT/Case Management/Adjunct)  03/14/2012 11 AM  Type of Therapy:  Aftercare Planning, Group Therapy, Dance/Movement Therapy   Participation Level:  Active  Participation Quality:  Appropriate  Affect:  Appropriate  Cognitive:  Appropriate  Insight:  Good  Engagement in Group:  Good  Engagement in Therapy:  Good  Modes of Intervention:  Clarification, Problem-solving, Role-play, Socialization and Support  Summary of Progress/Problems: After Care: Pt. attended and participated in aftercare planning group. Pt. accepted information on suicide prevention, warning signs to look for with suicide and crisis line numbers to use. The pt. agreed to call crisis line numbers if having warning signs or having thoughts of suicide. Pt. did not have any aftercare concerns but did seem confused and a little unsure.  Counseling: Therapist and group members discussed positive and negative support systems. Group members shared how they can support themselves when they leave the hospital. Pt. shared that support is engaging in an agreement of trust with someone else.   Cassidi Long 03/14/2012. 11:35 AM

## 2012-03-14 NOTE — Progress Notes (Signed)
D   Pt is childlike in her behavior  She is pleasant on approach and smiles easily   She is quiet and although she attended group and appeared to be enjoying herself she did not share   It was reported that pt would not take medications this morning from the nurse because she does not take meds in the morning   She has been visible on the milue and interacts with select peers and staff   A   Verbal support given  Discussed medications with pt and need for compliance to facilitate a quicker discharge  Praised pt for attentiveness in group and encouraged sharing   Q 15 min checks R   Pt safe at present and said she would try but was hard to talk in a group of people

## 2012-03-14 NOTE — ED Provider Notes (Signed)
Medical screening examination/treatment/procedure(s) were performed by non-physician practitioner and as supervising physician I was immediately available for consultation/collaboration.   Forbes Cellar, MD 03/14/12 1309

## 2012-03-15 LAB — T4, FREE: Free T4: 1.22 ng/dL (ref 0.80–1.80)

## 2012-03-15 LAB — TSH: TSH: 0.636 u[IU]/mL (ref 0.350–4.500)

## 2012-03-15 LAB — T3, FREE: T3, Free: 2.8 pg/mL (ref 2.3–4.2)

## 2012-03-15 MED ORDER — ZIPRASIDONE HCL 80 MG PO CAPS
100.0000 mg | ORAL_CAPSULE | Freq: Every day | ORAL | Status: DC
  Administered 2012-03-15 – 2012-03-17 (×3): 100 mg via ORAL
  Filled 2012-03-15 (×5): qty 1

## 2012-03-15 NOTE — Tx Team (Signed)
Interdisciplinary Treatment Plan Update (Adult)  Date:  03/15/2012  Time Reviewed:  10:15AM-11:15AM  Progress in Treatment: Attending groups:  Yes Participating in groups:    Somewhat Taking medication as prescribed:    Yes Tolerating medication:   Yes Family/Significant other contact made:  Not yet made, but patient has given consent to talk to her mother Patient understands diagnosis:   No Discussing patient identified problems/goals with staff:   Yes, slightly Medical problems stabilized or resolved:   Yes Denies suicidal/homicidal ideation:  Yes Issues/concerns per patient self-inventory:   None Other:    New problem(s) identified: No, Describe:    Reason for Continuation of Hospitalization: Medication stabilization Other; describe disorganized, psychotic  Interventions implemented related to continuation of hospitalization:  Medication monitoring and adjustment, safety checks Q15 min., suicide risk assessment, group therapy, psychoeducation, collateral contact, aftercare planning, ongoing physician assessments, medication education  Additional comments:  Not applicable  Estimated length of stay:  1-3 days  Discharge Plan:  Go to live with mother, follow up with [facility]  New goal(s):  Not applicable  Review of initial/current patient goals per problem list:   1.  Goal(s):  Medication stabilization  Met:  No  Target date:  By Discharge   As evidenced by:  Ongoing, medications being increased today  2.  Goal(s):  Return sleep to normal pattern of at least 6+ hours per night.  Met:  No  Target date:  By Discharge   As evidenced by:  Sleep last night was documented at  3 hours, although she said she slept through the night  3.  Goal(s):  Reduce auditory/visual hallucinations to baseline.  Met:  No  Target date:  By Discharge   As evidenced by:  Appears to be responding to internal stimuli, but does deny A/VH  4.  Goal(s):  Decrease paranoia to baseline and to  manageable level, where she can discharge without feeling threatened or feeling a need to threaten others.  Met:  No  Target date:  By Discharge   As evidenced by:  Patient still paranoid, although improving  Attendees: Patient:  [name]  03/15/2012 10:15AM-11:15AM  Family:     Physician:  Dr. Nelly Rout 03/15/2012 10:15AM-11:15AM  Nursing:   Joslyn Devon, RN 03/15/2012 10:15AM -11:15AM   Case Manager:  Ambrose Mantle, LCSW 03/15/2012 10:15AM-11:15AM  Counselor:  Veto Kemps, MT-BC 03/15/2012 10:15AM-11:15AM  Other:   Neill Loft, RN 03/15/2012   Other:      Other:      Other:       Scribe for Treatment Team:   Sarina Ser, 03/15/2012, 10:15AM-11:15AM

## 2012-03-15 NOTE — Progress Notes (Addendum)
D: Has been visible in milieu but generally does not interact with peers. Appears flat but does brighten in conversation. No acute distress noted. States she has had a good day and is eager for D/C. When asked what will be different after this admission, she replied, " I will take my meds like Im supposed to and I will not slack off.". When asked to explain slack off, she admitted that she would sometimes skip doses for days at a time. Denies SI/HI/AVH and contracts for safety. Although she denies AVH, she continues to appear to be responding to internal stimuli and sometimes has a hard time focusing on questions.  A: Support and encouragement provided. Safety has been maintained with Q15 minute observation. POC and medications for shift reviewed and understanding verbalized. Discussed the need for her to take as shower (it was reported she has not showered since she has been here). She states she felt like there was something wrong with the drain. When the writer offered to check it out, she replied that she felt like it was working ok now, but she did not have any shampoo. Writer provided shampoo and encouraged pt to get a shower.  R: Pt is safe. She is pleasant and cooperative on approach and is compliant with her treatment goals. Will give meds as ordered, cont to encourage good hygiene and continue current POC.  Addendum: Pt initially refused to take her HS dose of Geodon. She was angry and agitated, saying that she was not told there would be an increase in her Geodon. Writer went to great lengths to reassure her the MD did order the dose and therefore, it is a part of her POC. She took the 20mg  capsule out and took it but refused to take the 80mg  capsule. She called her mother and then reluctantly came back and took the additional 80mg  for a total dose of 100mg .

## 2012-03-15 NOTE — Progress Notes (Signed)
Psychoeducational Group Note  Date:  03/15/2012 Time:  2000  Group Topic/Focus:  Wrap-Up Group:   The focus of this group is to help patients review their daily goal of treatment and discuss progress on daily workbooks.  Participation Level:  Did Not Attend  Participation Quality:    Affect:   Cognitive:    Insight:    Engagement in Group:    Additional Comments:  Pt was in bed, felt tired.  Isla Pence M 03/15/2012, 9:10 PM

## 2012-03-15 NOTE — Progress Notes (Signed)
Patient ID: [name], female   DOB: [birth year], 26 y.o.   MRN: [id number] [facility] MD Progress Note  03/15/2012 2:29 PM  Diagnosis:  Axis I: Schizo affective Disorder, bipolar subtype  ADL's:  Impaired  Sleep: Poor  Appetite:  Poor  Suicidal Ideation:  Plan:  No Intent:  No Means:  No Homicidal Ideation:  Plan:  To hurt Mom with a butcher knife Intent:  To hurt Mom Means:  Butcher knife  AEB (as evidenced by):  Mental Status Examination/Evaluation: Objective:  Appearance: Disheveled  Eye Contact::  Fair  Speech:  Pressured  Volume:  Decreased  Mood:  Depressed, Dysphoric and Irritable  Affect:  Non-Congruent and Labile  Thought Process:  Disorganized, Irrelevant and Loose  Orientation:  Other:  place and person  Thought Content:  Delusions and Hallucinations: Patient denies but seems to be responding to internal stimuli  Suicidal Thoughts:  No  Homicidal Thoughts:  No  Memory:  Immediate;   Fair Recent;   Fair Remote;   Fair  Judgement:  Poor  Insight:  Lacking  Psychomotor Activity:  Mannerisms  Concentration:  Poor  Recall:  Fair  Akathisia:  No    AIMS (if indicated):   Score is 0  Assets:  Housing Physical Health Social Support  Sleep:  Number of Hours: 3    Vital Signs:Blood pressure 110/69, pulse 85, temperature 97.7 F (36.5 C), temperature source Oral, resp. rate 16, height 5\' 2"  (1.575 m), weight 134 lb 8 oz (61.009 kg), last menstrual period 02/04/2012. Current Medications: Current Facility-Administered Medications  Medication Dose Route Frequency Provider Last Rate Last Dose  . acetaminophen (TYLENOL) tablet 650 mg  650 mg Oral Q6H PRN Jorje Guild, PA-C      . alum & mag hydroxide-simeth (MAALOX/MYLANTA) 200-200-20 MG/5ML suspension 30 mL  30 mL Oral Q4H PRN Jorje Guild, PA-C      . lithium carbonate capsule 300 mg  300 mg Oral QAC breakfast Curlene Labrum Readling, MD   300 mg at 03/12/12 0654  . lithium carbonate capsule 600 mg  600 mg Oral QHS Curlene Labrum  Readling, MD   600 mg at 03/14/12 2153  . magnesium hydroxide (MILK OF MAGNESIA) suspension 30 mL  30 mL Oral Daily PRN Jorje Guild, PA-C      . ziprasidone (GEODON) capsule 60 mg  60 mg Oral QHS Curlene Labrum Readling, MD   60 mg at 03/14/12 2153    Lab Results:  Results for orders placed during the hospital encounter of 03/10/12 (from the past 48 hour(s))  TSH     Status: Normal   Collection Time   03/14/12  7:45 PM      Component Value Range Comment   TSH 0.636  0.350 - 4.500 uIU/mL   T4, FREE     Status: Normal   Collection Time   03/14/12  7:45 PM      Component Value Range Comment   Free T4 1.22  0.80 - 1.80 ng/dL   T3, FREE     Status: Normal   Collection Time   03/14/12  7:45 PM      Component Value Range Comment   T3, Free 2.8  2.3 - 4.2 pg/mL   LITHIUM LEVEL     Status: Abnormal   Collection Time   03/14/12  7:45 PM      Component Value Range Comment   Lithium Lvl 0.39 (*) 0.80 - 1.40 mEq/L     Physical Findings: Review of systems  negative for any cardiac issues , GI issues, neurological issues today AIMS: Facial and Oral Movements Muscles of Facial Expression: None, normal Lips and Perioral Area: None, normal Jaw: None, normal Tongue: None, normal,Extremity Movements Upper (arms, wrists, hands, fingers): None, normal Lower (legs, knees, ankles, toes): None, normal, Trunk Movements Neck, shoulders, hips: None, normal, Overall Severity Severity of abnormal movements (highest score from questions above): None, normal Incapacitation due to abnormal movements: None, normal Patient's awareness of abnormal movements (rate only patient's report): No Awareness, Dental Status Current problems with teeth and/or dentures?: No Does patient usually wear dentures?: No  CIWA:    COWS:     Treatment Plan Summary: Daily contact with patient to assess and evaluate symptoms and progress in treatment Medication management Patient needs to have stable mood, no longer have any homicidal  thoughts in order to be discharged home  Plan: Will increase Geodon to 100 mg at bedtime to help for mood stabilization and psychosis Continue lithium carbonate 300 mg in the morning and 600 mg at bedtime for mood stabilization Labs reviewed today Patient to continue to participate in groups  [facility] 03/15/2012, 2:29 PM

## 2012-03-15 NOTE — Progress Notes (Signed)
D patient states she slept well last nite and his appetite is good, states her energy level is normal for her and ability to pay attention is good, rates depression and hopelessness today at 1/10, deniews Si or HI, no c/o physical problems today. A taking meds as ordered by MD, voicing no complaints, q71min safety checks continue and support offered her, states her personal needs are being met R safety is maintained for patient

## 2012-03-15 NOTE — Therapy (Signed)
Psychoeducational Group Note  Date:  03/14/2012 Time:  2005  Group Topic/Focus:  Wrap-Up Group:   The focus of this group is to help patients review their daily goal of treatment and discuss progress on daily workbooks.  Participation Level:  Minimal  Participation Quality:  Resistant  Affect:  Resistant  Cognitive:  Oriented  Insight:  Limited  Engagement in Group:  Limited  Additional Comments:  Patient attended wrap-up group this evening.   Khaidyn Staebell, Newton Pigg 03/15/2012, 2:40 AM

## 2012-03-15 NOTE — Progress Notes (Signed)
03/15/2012         Time: 0930      Group Topic/Focus: The focus of this group is on enhancing the patient's understanding of leisure, barriers to leisure, and the importance of engaging in positive leisure activities upon discharge for improved total health.  Participation Level: Minimal  Participation Quality: Resistant  Affect: Blunted  Cognitive: Alert  Additional Comments: Patient late to group, interacted minimally with peers, sat with her back to the room through much of the activity.   Dolan Xia 03/15/2012 11:55 AM

## 2012-03-15 NOTE — Discharge Planning (Signed)
Met with patient in Aftercare Planning Group and provided today's workbook based on theme of the day. She was pleasant, smiling, and expressed desire to discharge.  She stated she feels she is well enough, and is ready to go home.  She was more spontaneous and focused in her speech.  Can see improvements, but her speech and eye contact remains hesitant, and paranoia is still present.  Will continue to stabilize on medications.  States there are no case management needs today.    Ambrose Mantle, LCSW 03/15/2012, 12:16 PM

## 2012-03-15 NOTE — Progress Notes (Signed)
D. Pt. Pleasant and soft spoken.  Denies SI/HI and denies A/V hallucinations.  Pt. Focused on going home A.  Encouragement and support given. R.  Pt. Receptive.

## 2012-03-15 NOTE — Progress Notes (Signed)
Psychoeducational Group Note  Date:  03/15/2012 Time: 1100  Group Topic/Focus:  Wellness Toolbox:   The focus of this group is to discuss various aspects of wellness, balancing those aspects and exploring ways to increase the ability to experience wellness.  Patients will create a wellness toolbox for use upon discharge.  Participation Level:  Minimal  Participation Quality:  Redirectable  Affect:  Appropriate  Cognitive:  Confused  Insight:  Limited  Engagement in Group:  Limited  Additional Comments:  Pt was able to attend group this morning but seem confused as to what was going on in group. Pt kept talking about other topics different from the group's topic.  Amari Zagal E 03/15/2012, 5:43 PM

## 2012-03-15 NOTE — Progress Notes (Signed)
Patient ID: [name], female   DOB: [birth year], 26 y.o.   MRN: [id number] Patient ID: [name], female   DOB: [birth year], 26 y.o.   MRN: [id number] [facility] MD Progress Note  03/15/2012 9:03 AM  Diagnosis:  Axis I: Schizo affective Disorder, bipolar subtype  ADL's:  improving  Sleep: Poor  Appetite:  Poor  Suicidal Ideation:  Plan:  No Intent:  No Means:  No Homicidal Ideation:   None today   AEB (as evidenced by):  Mental Status Examination/Evaluation: Objective:  Appearance: Disheveled  Eye Contact::  Fair  Speech:  Pressured  Volume:  Decreased  Mood:  Depressed, Dysphoric and Irritable  Affect:  Non-Congruent and Labile  Thought Process:  Disorganized, Irrelevant and Loose  Orientation:  Other:  place and person  Thought Content:  Denies any AVH  Suicidal Thoughts:  No  Homicidal Thoughts:  No  Memory:  Immediate;   Fair Recent;   Fair Remote;   Fair  Judgement:  Poor  Insight:  Lacking  Psychomotor Activity:  Mannerisms  Concentration:  Poor  Recall:  poor  Akathisia:  No    AIMS (if indicated):   Score is 0  Assets:  Housing Physical Health Social Support  Sleep:  Number of Hours: 3    Vital Signs:Blood pressure 110/69, pulse 85, temperature 97.7 F (36.5 C), temperature source Oral, resp. rate 16, height 5\' 2"  (1.575 m), weight 61.009 kg (134 lb 8 oz), last menstrual period 02/04/2012. Current Medications: Current Facility-Administered Medications  Medication Dose Route Frequency Provider Last Rate Last Dose  . acetaminophen (TYLENOL) tablet 650 mg  650 mg Oral Q6H PRN Jorje Guild, PA-C      . alum & mag hydroxide-simeth (MAALOX/MYLANTA) 200-200-20 MG/5ML suspension 30 mL  30 mL Oral Q4H PRN Jorje Guild, PA-C      . lithium carbonate capsule 300 mg  300 mg Oral QAC breakfast Curlene Labrum Readling, MD   300 mg at 03/12/12 0654  . lithium carbonate capsule 600 mg  600 mg Oral QHS Curlene Labrum Readling, MD   600 mg at 03/14/12 2153  . magnesium hydroxide (MILK OF  MAGNESIA) suspension 30 mL  30 mL Oral Daily PRN Jorje Guild, PA-C      . ziprasidone (GEODON) capsule 60 mg  60 mg Oral QHS Curlene Labrum Readling, MD   60 mg at 03/14/12 2153    Lab Results:  Results for orders placed during the hospital encounter of 03/10/12 (from the past 48 hour(s))  TSH     Status: Normal   Collection Time   03/14/12  7:45 PM      Component Value Range Comment   TSH 0.636  0.350 - 4.500 uIU/mL   T4, FREE     Status: Normal   Collection Time   03/14/12  7:45 PM      Component Value Range Comment   Free T4 1.22  0.80 - 1.80 ng/dL   T3, FREE     Status: Normal   Collection Time   03/14/12  7:45 PM      Component Value Range Comment   T3, Free 2.8  2.3 - 4.2 pg/mL   LITHIUM LEVEL     Status: Abnormal   Collection Time   03/14/12  7:45 PM      Component Value Range Comment   Lithium Lvl 0.39 (*) 0.80 - 1.40 mEq/L     Physical Findings: Review of systems negative for any cardiac issues , GI issues,  neurological issues today AIMS: Facial and Oral Movements Muscles of Facial Expression: None, normal Lips and Perioral Area: None, normal Jaw: None, normal Tongue: None, normal,Extremity Movements Upper (arms, wrists, hands, fingers): None, normal Lower (legs, knees, ankles, toes): None, normal, Trunk Movements Neck, shoulders, hips: None, normal, Overall Severity Severity of abnormal movements (highest score from questions above): None, normal Incapacitation due to abnormal movements: None, normal Patient's awareness of abnormal movements (rate only patient's report): No Awareness, Dental Status Current problems with teeth and/or dentures?: No Does patient usually wear dentures?: No  CIWA:    COWS:       Was seen on 7/21. Able to sleep and thinks she is is getting better. In better mood now.  Thinks she is less paranoid now.  Treatment Plan Summary: Daily contact with patient to assess and evaluate symptoms and progress in treatment Medication management Patient  needs to have stable mood, no longer have any homicidal thoughts now. Focused on discharge now. Still disorganized   Plan: Will continue Geodon to 80 mg at bedtime to help for mood stabilization Continue lithium carbonate 300 mg in the morning and 600 mg at bedtime for mood stabilization Will order lithium level to be checked Sunday morning Patient to continue to participate in groups  Antawn Sison, Livingston Diones 03/15/2012, 9:03 AM

## 2012-03-15 NOTE — Progress Notes (Signed)
[facility] Group Notes:  (Counselor/Nursing/MHT/Case Management/Adjunct)  03/15/2012 2:32 PM  Type of Therapy:  Group Therapy  Participation Level:  Minimal  Participation Quality:  Attentive  Affect:  Blunted  Cognitive:  Oriented  Insight:  Limited  Engagement in Group:  Limited  Engagement in Therapy:  Limited  Modes of Intervention:  Education and Support   Summary of Progress/Problems: Patient was guarded in interactions. She did not initiate interactions but responded to questions by stating that she was trying to listen and get words of wisdom. Patient appeared to have disorganized thinking as evidenced by her verbal interaction.       [name] 03/15/2012, 2:32 PM

## 2012-03-16 NOTE — Progress Notes (Signed)
D: In hallway on approach. Appears flat and depressed, but does flash an uncomfortable smile at intervals. Somewhat anxious and guarded but cooperative with assessment. No acute distress noted. States she has had a better day. We discussed the need to perform an EKG in order to fully assess the electrical component of her heart as it can be affected by certain medications, Geodon being one of them. Writer assured her it would be a quick test and we could take an additional female RN Vernona Rieger, RN) if she is uncomfortable. She labored for a moment, but did agree to have the EKG done. She did attempt to sit up to leave a couple of times during EKG, but she was able to maintain so the EKG could record. She tolerated the test well. She does not appear to be internally preoccupied to the degree she was last night. She was able to following conversation without looking away and muttering under her breath. She also appeared to be better able to process information and was more decisive. She continues to deny SI/HI/AVH and contracts for safety.   A: Support and encouragement provided. Safety has been maintained with Q15 minute observation. EKG performed per MD order. Will give medications as ordered by MD.  R: Pt remains safe. She was cooperative with EKG and tolerated well. She is compliant with her treatment goals. Will continue current POC.

## 2012-03-16 NOTE — Discharge Planning (Signed)
Met with patient in Aftercare Planning Group and provided today's workbook based on theme of the day.  Patient was in and out of the room several times to use the bathroom, then was asked by Case Manager to remain out of the room due to the distractions she was creating.  Her only comment re any need for today was that the "medications are making me sluggish."    Ambrose Mantle, LCSW 03/16/2012, 2:00 PM

## 2012-03-16 NOTE — Progress Notes (Signed)
Patient ID: [name], female   DOB: 05/11/1986, 26 y.o.   MRN: [id number] [facility] MD Progress Note  03/16/2012 1:03 PM  Diagnosis:  Axis I: Schizo affective Disorder, bipolar subtype  ADL's:  Impaired  Sleep: Poor  Appetite:  Poor  Suicidal Ideation:  Plan:  No Intent:  No Means:  No Homicidal Ideation:  Plan:  To hurt Mom with a butcher knife Intent:  To hurt Mom Means:  Butcher knife  AEB (as evidenced by):  Mental Status Examination/Evaluation: Objective:  Appearance: Disheveled  Eye Contact::  Fair  Speech:  Normal Rate  Volume:  Decreased  Mood:  Depressed, Dysphoric and Irritable  Affect:  Non-Congruent and Labile  Thought Process:  Disorganized, Irrelevant and Loose  Orientation:  Other:  place and person  Thought Content:  Delusions and Hallucinations: Patient denies but seems to be responding to internal stimuli  Suicidal Thoughts:  No  Homicidal Thoughts:  No  Memory:  Immediate;   Fair Recent;   Fair Remote;   Fair  Judgement:  Poor  Insight:  Lacking  Psychomotor Activity:  Mannerisms  Concentration:  Poor  Recall:  Fair  Akathisia:  No    AIMS (if indicated):   Score is 0  Assets:  Housing Physical Health Social Support  Sleep:  Number of Hours: 5.75    Vital Signs:Blood pressure 110/69, pulse 85, temperature 97.7 F (36.5 C), temperature source Oral, resp. rate 16, height 5\' 2"  (1.575 m), weight 134 lb 8 oz (61.009 kg), last menstrual period 02/04/2012. Current Medications: Current Facility-Administered Medications  Medication Dose Route Frequency Provider Last Rate Last Dose  . acetaminophen (TYLENOL) tablet 650 mg  650 mg Oral Q6H PRN Jorje Guild, PA-C      . alum & mag hydroxide-simeth (MAALOX/MYLANTA) 200-200-20 MG/5ML suspension 30 mL  30 mL Oral Q4H PRN Jorje Guild, PA-C      . lithium carbonate capsule 300 mg  300 mg Oral QAC breakfast Curlene Labrum Readling, MD   300 mg at 03/16/12 0657  . lithium carbonate capsule 600 mg  600 mg Oral QHS Curlene Labrum  Readling, MD   600 mg at 03/15/12 2140  . magnesium hydroxide (MILK OF MAGNESIA) suspension 30 mL  30 mL Oral Daily PRN Jorje Guild, PA-C      . ziprasidone (GEODON) capsule 100 mg  100 mg Oral QHS Nelly Rout, MD   100 mg at 03/15/12 2140  . DISCONTD: ziprasidone (GEODON) capsule 60 mg  60 mg Oral QHS Curlene Labrum Readling, MD   60 mg at 03/14/12 2153    Lab Results:  Results for orders placed during the hospital encounter of 03/10/12 (from the past 48 hour(s))  TSH     Status: Normal   Collection Time   03/14/12  7:45 PM      Component Value Range Comment   TSH 0.636  0.350 - 4.500 uIU/mL   T4, FREE     Status: Normal   Collection Time   03/14/12  7:45 PM      Component Value Range Comment   Free T4 1.22  0.80 - 1.80 ng/dL   T3, FREE     Status: Normal   Collection Time   03/14/12  7:45 PM      Component Value Range Comment   T3, Free 2.8  2.3 - 4.2 pg/mL   LITHIUM LEVEL     Status: Abnormal   Collection Time   03/14/12  7:45 PM  Component Value Range Comment   Lithium Lvl 0.39 (*) 0.80 - 1.40 mEq/L     Physical Findings: Review of systems negative for any cardiac issues , GI issues, neurological issues today AIMS: Facial and Oral Movements Muscles of Facial Expression: None, normal Lips and Perioral Area: None, normal Jaw: None, normal Tongue: None, normal,Extremity Movements Upper (arms, wrists, hands, fingers): None, normal Lower (legs, knees, ankles, toes): None, normal, Trunk Movements Neck, shoulders, hips: None, normal, Overall Severity Severity of abnormal movements (highest score from questions above): None, normal Incapacitation due to abnormal movements: None, normal Patient's awareness of abnormal movements (rate only patient's report): No Awareness, Dental Status Current problems with teeth and/or dentures?: No Does patient usually wear dentures?: No  CIWA:    COWS:     Treatment Plan Summary: Daily contact with patient to assess and evaluate symptoms and  progress in treatment Medication management Patient needs to have stable mood, no longer have any homicidal thoughts in order to be discharged home  Plan:Continue Geodon to 100 mg at bedtime to help for mood stabilization and psychosis. Patient apprehensive about the increased dose of Geodon. Discussed with patient the need for the increased dose to help with her psychosis and increased anxiety Continue lithium carbonate 300 mg in the morning and 600 mg at bedtime for mood stabilization EKG was ordered yesterday for patient to rule out QT prolongation secondary to the increased dose of Geodon Patient to continue to participate in groups  [facility] 03/16/2012, 1:03 PM

## 2012-03-16 NOTE — Progress Notes (Addendum)
Patient remains with AH; has been observed talking to herself and responding to stimuli.  She denies any SI/HI.  patient also refuses to take a shower in her room; stating "I don't bathe in public places."  She was allowed to take a bath in the main bathroom off the 400 hall.  Patient has order for EKG due to geodon admin. And she refused twice to let staff complete it.  She has flat affect; at times appears hostile.  She has minimal interaction with staff.  Continue to monitor medication management and MD orders.  Continue with 15 minutes safety checks. Encourage patient to comply with MD recommendations and cooperate with staff.  Patient denies SI/HI.

## 2012-03-16 NOTE — Progress Notes (Signed)
Psychoeducational Group Note  Date:  03/16/2012 Time: 2030  Group Topic/Focus:  Wrap-Up Group:   The focus of this group is to help patients review their daily goal of treatment and discuss progress on daily workbooks.  Participation Level:  Active  Participation Quality:  Attentive and Redirectable  Affect:  Appropriate  Cognitive:  Alert and Appropriate  Insight:  Good  Engagement in Group:  Good  Additional Comments:  Patient attended and participated in wrap-up group this evening. Patient shared that she learned a lot and that she was pleased with her time in the hospital.  Milania Haubner, Newton Pigg 03/16/2012, 8:50 PM

## 2012-03-17 NOTE — Progress Notes (Signed)
[facility] Group Notes:  (Counselor/Nursing/MHT/Case Management/Adjunct)  03/17/2012 2:14 PM  Type of Therapy:  Group Therapy  Participation Level:  Active  Participation Quality:  Attentive and Sharing  Affect:  Blunted  Cognitive:  Oriented  Insight:  Limited  Engagement in Group:  Good  Engagement in Therapy:  Good  Modes of Intervention:  Clarification, Education, Problem-solving and Support  Summary of Progress/Problems: Patient stated that she had a good visit with mother and that she thought she was improving. Pt. Initially described her mind as being on pause but later described it as being slowed down and everybody else around her was going full speed. Talked about medications and side effects and to give herself a chance to get used to the medications.   Eyva Califano, Aram Beecham 03/17/2012, 2:14 PM

## 2012-03-17 NOTE — Progress Notes (Signed)
Patient ID: [name], female   DOB: 1985/11/27, 26 y.o.   MRN: [id number] D: Pt. Attended group, reports "I talked." Denies AVH, but noted talking to self, suspicious and preoccupied. Pt. Worried about meds "Geodon 60 works just fine, why did they have to change it?" "I just don't want to take 100mg . Pt. Notes that she sleep a little more with the 100mg . Staff will monitor q11min for safety. A: Writer reviewed meds and noted that pt. Sleep time was not increased. Writer encouraged pt. To follow med regime to promote wellness so that she will enhance discharge time. Writer also pointed out to pt. How sleep helps the body to rejuvenate and prepare for another day, fight off fatigue and helps the immune system be strong. Pt. Nod in agreement and agrees to take night meds. R: Pt. Remains safe on the unit. Pt. Takes meds without incidence.

## 2012-03-17 NOTE — Progress Notes (Signed)
Psychoeducational Group Note  Date:  03/17/2012 Time:  2000  Group Topic/Focus:  Wrap-Up Group:   The focus of this group is to help patients review their daily goal of treatment and discuss progress on daily workbooks.  Participation Level:  Minimal  Participation Quality:  Appropriate  Affect:  Appropriate  Cognitive:  Confused  Insight:  Limited  Engagement in Group:  Limited  Additional Comments:  Pt stated that day was ok, but seemed to be confused when formulating her words.  Kaleen Odea R 03/17/2012, 9:30 PM

## 2012-03-17 NOTE — Progress Notes (Signed)
Patient ID: [name], female   DOB: 10-19-1985, 26 y.o.   MRN: [id number] [facility] MD Progress Note  03/17/2012 12:25 PM  Diagnosis:  Axis I: Schizo affective Disorder, bipolar subtype  ADL's:  Impaired  Sleep: Poor  Appetite:  Poor  Suicidal Ideation:  Plan:  No Intent:  No Means:  No Homicidal Ideation:  Plan:  To hurt Mom with a butcher knife Intent:  To hurt Mom Means:  Butcher knife  AEB (as evidenced by):  Mental Status Examination/Evaluation: Objective:  Appearance: Disheveled  Eye Contact::  Fair  Speech:  Normal Rate  Volume:  Decreased  Mood:  Depressed and Dysphoric  Affect:  Non-Congruent and Labile  Thought Process:  Disorganized, Irrelevant and Loose  Orientation:  Other:  place and person  Thought Content:  Delusions  Suicidal Thoughts:  No  Homicidal Thoughts:  No  Memory:  Immediate;   Fair Recent;   Fair Remote;   Fair  Judgement:  Poor  Insight:  Lacking  Psychomotor Activity:  Mannerisms  Concentration:  Poor  Recall:  Fair  Akathisia:  No    AIMS (if indicated):   Score is 0  Assets:  Housing Physical Health Social Support  Sleep:  Number of Hours: 4.75    Vital Signs:Blood pressure 110/81, pulse 69, temperature 97.1 F (36.2 C), temperature source Oral, resp. rate 16, height 5\' 2"  (1.575 m), weight 134 lb 8 oz (61.009 kg), last menstrual period 02/04/2012. Current Medications: Current Facility-Administered Medications  Medication Dose Route Frequency Provider Last Rate Last Dose  . acetaminophen (TYLENOL) tablet 650 mg  650 mg Oral Q6H PRN Jorje Guild, PA-C      . alum & mag hydroxide-simeth (MAALOX/MYLANTA) 200-200-20 MG/5ML suspension 30 mL  30 mL Oral Q4H PRN Jorje Guild, PA-C      . lithium carbonate capsule 300 mg  300 mg Oral QAC breakfast Curlene Labrum Readling, MD   300 mg at 03/17/12 4401  . lithium carbonate capsule 600 mg  600 mg Oral QHS Curlene Labrum Readling, MD   600 mg at 03/16/12 2202  . magnesium hydroxide (MILK OF MAGNESIA) suspension  30 mL  30 mL Oral Daily PRN Jorje Guild, PA-C      . ziprasidone (GEODON) capsule 100 mg  100 mg Oral QHS Nelly Rout, MD   100 mg at 03/16/12 2202    Lab Results:  No results found for this or any previous visit (from the past 48 hour(s)).  Physical Findings: Review of systems negative for any cardiac issues , GI issues, neurological issues today AIMS: Facial and Oral Movements Muscles of Facial Expression: None, normal Lips and Perioral Area: None, normal Jaw: None, normal Tongue: None, normal,Extremity Movements Upper (arms, wrists, hands, fingers): None, normal Lower (legs, knees, ankles, toes): None, normal, Trunk Movements Neck, shoulders, hips: None, normal, Overall Severity Severity of abnormal movements (highest score from questions above): None, normal Incapacitation due to abnormal movements: None, normal Patient's awareness of abnormal movements (rate only patient's report): No Awareness, Dental Status Current problems with teeth and/or dentures?: No Does patient usually wear dentures?: No  CIWA:    COWS:     Treatment Plan Summary: Daily contact with patient to assess and evaluate symptoms and progress in treatment Medication management Patient needs to have stable mood, no longer have any homicidal thoughts in order to be discharged home  Plan:Continue Geodon  100 mg at bedtime to help for mood stabilization and psychosis. Patient seems to be doing better today  Continue lithium carbonate 300 mg in the morning and 600 mg at bedtime for mood stabilization EKG was normal Patient to continue to participate in groups  [facility] 03/17/2012, 12:25 PM

## 2012-03-17 NOTE — Discharge Planning (Signed)
Met with patient in Aftercare Planning Group and provided today's workbook based on theme of the day.  Her thinking is clearing and she is displaying less thought blocking.  Patient asked to see Case Manager individually to ask a question.  During the individual time, her RNs were present, and she asked about when she will get the results of her EKG.  She expressed concern about her heart, but did understand the RNs' assurance that many people are given that test to ensure that the medications are not affecting their heart, so that they can continue to take the medication without fear.  She verbalized understanding.  Patient talked at length about the reasons for her fears in her home, and why this resulted in her sleeping with a knife under her pillow.  The fears did seem blown out of proportion to the events, and thus did seem more like paranoia.  However, she is able to put into words now her thoughts about those fears, so progress is being made on the current medications.  The patient also talked about having a need to have a social life, and said she currently does nothing outside the home.  Case Manager told her about the [employer] [facility] [employer], and she initially rolled her eyes, acted disinterested.  However, Case Manager described the classes and schedule in detail, and urged her to try to attend at least 3 times before making up her mind.  She finally stated that it did sound interesting.  Case Manager called Vesta Mixer and informed that patient cannot make the currently-scheduled doctor's appointment tomorrow, and asked that it be rescheduled to next week.  Awaiting call back.  Ambrose Mantle, LCSW 03/17/2012, 11:40 AM

## 2012-03-17 NOTE — Progress Notes (Signed)
Psychoeducational Group Note  Date:  03/17/2012 Time:  1100  Group Topic/Focus:  Crisis Planning:   The purpose of this group is to help patients create a crisis plan for use upon discharge or in the future, as needed.  Participation Level:  Minimal  Participation Quality:  Sharing  Affect:  Appropriate and Flat  Cognitive:  Appropriate  Insight:  Good  Engagement in Group:  Good  Additional Comments:  none  Jacobe Study M 03/17/2012, 1:32 PM

## 2012-03-17 NOTE — Progress Notes (Signed)
D:  Pt. about on the unit this am.  She states that she slept well last pm.  She says that her appetite is good and that her energy level is normal per self inventory.  She rates her depression as a 1/10 and hopelessness as a 0/10 .  She asks about the results of EKG that was done.  She denies SI/HI and A/V hallucinations.  [name]'s thoughts seem to be clearer/seems to be making clearing clearer sentences when speaking with staff.  She says that she slept with a knife at her bed  for safety reasons at home because "there is no back door".  "You never know".  Pt. Mother reportedly has gotten rid of the knife  She says that she wants to be more social and be able to interact with other people more.  A:  Given support and encouragement.  Given meds as prescribed.  Encouraged her to attend groups especially the 1pm group today to be ran by [facility].  R:  Receptive to staff.  remains on q 15 minute checks for safety.  Will continue to monitor.

## 2012-03-17 NOTE — Progress Notes (Signed)
03/17/2012         Time: 0930      Group Topic/Focus: The focus of the group is on enhancing the patients' ability to utilize positive relaxation strategies by practicing several that can be used at discharge.  Participation Level: Minimal  Participation Quality: Attentive  Affect: Blunted  Cognitive: Alert   Additional Comments: Patient late to group after meeting with her case manager, participated in relaxation exercises and reported she found them to be helpful.   Cinnamon Morency 03/17/2012 11:43 AM

## 2012-03-17 NOTE — Progress Notes (Signed)
Adult Services Patient-Family Contact/Session  Attendees: Patient's mother, Alpa Salvo ([phone])   Goal(s):  Collateral, discuss process and discharge planning   Safety Concerns:  Had been concerned about her paranoia and her getting a knife.  Narrative:  Mother stated that patient had been acting strange for several days including being paranoid, thinking people were trying to harm her, a little hostile, not sleeping, talking to herself, and being difficult to get her attention. On the day of admission, she was found with a kitchen knife at her bed. Mother has gotten rid of the knife and plans to maybe put the additional kitchen knives in a place where she knows where they are but not patient just to keep things safe until they safely transition patient back home. Reported to mother about the increase in Geodon, and the improvement of patient's behavior and thinking with the increase. Asked her to give the medications a couple of days to see a positive outcome. Also reported to her that an EEG was being done to make sure there were no side effects from the medications. Mother was appreciative of information.  Barrier(s):  None   Interventions:  Support and information for mother   Recommendation(s): Continued inpatient treatment for stabilization of symptoms and medications.   Follow-up Required:  No  Explanation:    Veto Kemps 03/17/2012, 9:27 AM

## 2012-03-18 MED ORDER — ZIPRASIDONE HCL 60 MG PO CAPS
120.0000 mg | ORAL_CAPSULE | Freq: Every day | ORAL | Status: DC
  Administered 2012-03-19 – 2012-03-22 (×4): 120 mg via ORAL
  Filled 2012-03-18 (×4): qty 2
  Filled 2012-03-18: qty 1
  Filled 2012-03-18 (×3): qty 2

## 2012-03-18 NOTE — Progress Notes (Signed)
[facility] Group Notes:  (Counselor/Nursing/MHT/Case Management/Adjunct)  03/18/2012 2:14 PM  Type of Therapy:  Music Therapy  Participation Level:  None  Participation Quality:  Patient attended group, however when music therapist started handing out music at the beginning of the session, she quickly started toward the door and even with coaxing would not remain. She came back in about half way through the group, but again left, stating "it's nothing against you". She was observed to smiling inappropriately and responding to internal stimuli.    Calix Heinbaugh, Aram Beecham 03/18/2012, 2:14 PM

## 2012-03-18 NOTE — Progress Notes (Signed)
[facility] Group Notes:  (Counselor/Nursing/MHT/Case Management/Adjunct)  03/18/2012 11:29 AM  Type of Therapy:  Group Therapy  Participation Level:  Minimal  Participation Quality:  Inattentive  Affect:  Appropriate  Cognitive:  Confused, pt. not present.  Insight:  Limited  Engagement in Group:  Limited  Engagement in Therapy:  Limited  Modes of Intervention:  Problem-solving  Summary of Progress/Problems:  The pt. was able to sit through the duration of the morning group; however, was disengaged most of the time.  [name] presented with some slightly jerky motor movements.  [name] was not able to recall a life change or situation and stated she was distracted because she was thinking about laundry.   [name] 03/18/2012, 11:29 AM

## 2012-03-18 NOTE — Progress Notes (Signed)
Patient ID: [name], female   DOB: 07-13-1986, 26 y.o.   MRN: [id number] D: Pt. Visit with her mother tonight, no complaints at this time.Writer reviews meds and get pt./mom's opinion about increase in Geodon. Pt. reports "well if it's going to help.Pt. Goes to [place], sings, smiles. Still somewhat preoccupied and suspicious, although she denies voices. A: Staff will monitor for safety q57min. Staff promotes participation in [place], supportive, claps and encourages her. R: Pt. Remains safe on the unit.  Pt. A little reluctant but agrees that she will take her  meds tonight.

## 2012-03-18 NOTE — Progress Notes (Signed)
D: Patient denies SI/HI, auditory and visual hallucinations but seems to be responding to internal stimuli at times as evidenced by her responding out loud to herself while she looks around. Patient rates her depression a 1 out of 10 and her hopelessness 0 out of 10 (1 low/10 high). Patient reports sleeping well and having a good appetite and good energy level. Patient is going to groups and is trying to interact more but has some difficulty in this because her speech pattern is soft and sometimes slow. A: Patient given emotional support from RN. Patient encouraged to come to staff with concerns and/or questions. Patient's medication routine continued. Patient's orders and plan of care reviewed. Patient also encouraged by RN to report any voices or abnormal thoughts to RN or MD. R: Patient remains appropriate and cooperative. Will continue to monitor patient q15 minutes for safety.

## 2012-03-18 NOTE — Progress Notes (Signed)
Psychoeducational Group Note  Date:  03/18/2012 Time: 1100  Group Topic/Focus:  Overcoming Stress:   The focus of this group is to define stress and help patients assess their triggers.  Participation Level: Did Not Attend  Participation Quality:  Not Applicable  Affect:  Not Applicable  Cognitive:  Not Applicable  Insight:  Not Applicable  Engagement in Group: Not Applicable  Additional Comments:  Patient stayed in room and did not attend MHT group.   Itai Barbian A 03/18/2012, 11:50 PM

## 2012-03-18 NOTE — Discharge Planning (Signed)
Aftercare Planning Group  Date:  03/18/2012  Time:  8:30AM  Narrative:  Met with patient in Aftercare Planning Group and provided today's workbook based on theme of the day.  Patient stated she will "[facility] about" [facility].  She appeared to have some thought blocking, was hesitant in answering questions as though listening to something first.  She did not participate in later discussion about the difficulty of going through medication trials.  She did nod vigorously when it was discussed that a person can be close with their parents, but still needs to develop their own peer supports.  Affect:  Smiling  Insight:  Poor  Judgment:  Poor  Problems identified:  Disappointment in hearing that she would not discharge today  Request(s) made to Case Manager:  None  Still needed to finalize discharge plans:  Improvement of patient's symptoms only.  All else is in place.   Ambrose Mantle, LCSW 03/18/2012 10:05 AM

## 2012-03-18 NOTE — Progress Notes (Addendum)
Patient ID: [name], female   DOB: 03/07/1986, 26 y.o.   MRN: [id number] [facility] MD Progress Note  03/18/2012 10:23 AM  Diagnosis:  Axis I: Schizo affective Disorder, bipolar subtype  ADL's:  Impaired  Sleep: Poor  Appetite:  Poor  Suicidal Ideation:  Plan:  No Intent:  No Means:  No Homicidal Ideation:  Plan:  No Intent:  No Means:  No  AEB (as evidenced by):  Mental Status Examination/Evaluation: Objective:  Appearance: Disheveled  Eye Contact::  Fair  Speech:  Normal Rate  Volume:  Normal  Mood:  Depressed  Affect:  Congruent  Thought Process:  Coherent  Orientation:  Other:  place and person  Thought Content:  Delusions, denies hallucinations but seems to be responding to internal stimuli   Suicidal Thoughts:  No  Homicidal Thoughts:  No  Memory:  Immediate;   Fair Recent;   Fair Remote;   Fair  Judgement:  Poor  Insight:  Lacking  Psychomotor Activity:  Mannerisms  Concentration:  Poor  Recall:  Fair  Akathisia:  No    AIMS (if indicated):   Score is 0  Assets:  Housing Physical Health Social Support  Sleep:  Number of Hours: 6    Vital Signs:Blood pressure 101/60, pulse 83, temperature 97.2 F (36.2 C), temperature source Oral, resp. rate 16, height 5\' 2"  (1.575 m), weight 134 lb 8 oz (61.009 kg), last menstrual period 02/04/2012. Current Medications: Current Facility-Administered Medications  Medication Dose Route Frequency Provider Last Rate Last Dose  . acetaminophen (TYLENOL) tablet 650 mg  650 mg Oral Q6H PRN Jorje Guild, PA-C      . alum & mag hydroxide-simeth (MAALOX/MYLANTA) 200-200-20 MG/5ML suspension 30 mL  30 mL Oral Q4H PRN Jorje Guild, PA-C      . lithium carbonate capsule 300 mg  300 mg Oral QAC breakfast Curlene Labrum Readling, MD   300 mg at 03/18/12 0701  . lithium carbonate capsule 600 mg  600 mg Oral QHS Curlene Labrum Readling, MD   600 mg at 03/17/12 2204  . magnesium hydroxide (MILK OF MAGNESIA) suspension 30 mL  30 mL Oral Daily PRN Jorje Guild,  PA-C      . ziprasidone (GEODON) capsule 100 mg  100 mg Oral QHS Nelly Rout, MD   100 mg at 03/17/12 2206    Lab Results:  No results found for this or any previous visit (from the past 48 hour(s)). EKG was normal  Physical Findings: Review of systems negative for any cardiac issues , GI issues, neurological issues today. No EPS reported. AIMS: Facial and Oral Movements Muscles of Facial Expression: None, normal Lips and Perioral Area: None, normal Jaw: None, normal Tongue: None, normal,Extremity Movements Upper (arms, wrists, hands, fingers): None, normal Lower (legs, knees, ankles, toes): None, normal, Trunk Movements Neck, shoulders, hips: None, normal, Overall Severity Severity of abnormal movements (highest score from questions above): None, normal Incapacitation due to abnormal movements: None, normal Patient's awareness of abnormal movements (rate only patient's report): No Awareness, Dental Status Current problems with teeth and/or dentures?: No Does patient usually wear dentures?: No  CIWA:    COWS:     Treatment Plan Summary: Daily contact with patient to assess and evaluate symptoms and progress in treatment Medication management Patient needs to have stable mood, no longer have any homicidal thoughts in order to be discharged home Patient should not be hearing any voices in order to be discharged  Plan: Will increase Geodon to 120 mg at  bedtime to help for mood stabilization and psychosis. Patient seems to have decompensated some since yesterday in regards to her psychosis Continue lithium carbonate 300 mg in the morning and 600 mg at bedtime for mood stabilization Patient to continue to participate in groups Patient on discharge would benefit from attending classes at [facility]  [facility] 03/18/2012, 10:23 AM

## 2012-03-19 NOTE — Progress Notes (Signed)
Patient ID: [name], female   DOB: May 07, 1986, 26 y.o.   MRN: [id number] D. The patient packed her bags and was anticipating discharge. She told her mother who was visiting that she was told she was discharged and free to go. Appears more disorganized and paranoid. Denies any auditory hallucinations, but appears distracted and smiles inappropriately. A. It was explained to the patient that there was not a discharge order for her at this time and some of the reason for a delay in discharge was due to the patient not taking her medication as prescribed. Her Geodon had been increased yesterday so she refused to take it. Was encouraged by staff and her mother to take the new dose as prescribed by the doctor so her thoughts could get more organized and she could be discharged home. Encouraged to attend and participate in group. R. Took medication while mother was still visiting to give added support and encouragement. Attended and participated in group.Stated she wants to be involved with the doctors decision to increase her medication.

## 2012-03-19 NOTE — Progress Notes (Signed)
Adult Services Patient-Family Contact/Session  Attendees:  Patient's mother Nettie Elm [phone] Late Entry:  03/18/12 Goal(s):  Progress report    Safety Concerns:  None   Narrative:  Reported to mother that patient had exhibited some regression including talking to herself and responding to internal stimuli, increased paranoia and inability to remain in groups. Reported to her that medications were going to be increased. Requested that she visit her on the weekend and report to staff about how she thought patient was doing.  Barrier(s):  Patient's regression and slow response to medications  Interventions:  Update of patient's progress  Recommendation(s):  Continued inpatient treatment for stabilization of psychosis  Follow-up Required:  Yes  Explanation:  Get a report from mother after the weekend.  Halsey Hammen, Aram Beecham 03/19/2012, 3:05 PM

## 2012-03-19 NOTE — Progress Notes (Signed)
[facility] Group Notes:  (Counselor/Nursing/MHT/Case Management/Adjunct)  03/19/2012 3:53 PM  Type of Therapy:  Group Therapy  Participation Level:  None  Participation Quality:  Inattentive  Affect:  Inappropriate smiling   Cognitive:  Hallucinating  Insight:  None  Engagement in Group:  None  Engagement in Therapy:  None  Modes of Intervention:  Orientation  Summary of Progress/Problems: Patient was actively hallucinating in group. She left the group several times and got up several times and sat back down. She was exhibiting inappropriate smiling and was not engaged in group. Could not identify stressorsHartis, Aram Beecham 03/19/2012, 3:53 PM

## 2012-03-19 NOTE — Discharge Planning (Signed)
Aftercare Planning Group   -  03/19/2012 @ 8:30AM  Narrative:  Met with patient in Aftercare Planning Group and provided today's workbook based on theme of the day.  Patient was in and out of the room several times.  When asked to stay for Suicide Prevention Information, she stated she could not stay in group and appeared agitated.  Affect:  Smiling                   Insight:  Poor                   Judgment:  Fair  Problems identified:  Patient appeared to be responding to internal stimuli.  Request(s) made to Case Manager:  None  Still needed to finalize discharge plans:  Improvement of symptoms.  Ambrose Mantle, LCSW 03/19/2012 9:59 AM

## 2012-03-19 NOTE — Progress Notes (Signed)
03/19/2012      Time: 0930      Group Topic/Focus: The focus of this group is on discussing various styles of communication and communicating assertively using 'I' (feeling) statements.  Participation Level: Minimal  Participation Quality: Resistant  Affect: Anxious  Cognitive: Alert   Additional Comments: Patient came late to group, wandered for several minutes, then left early.  Cayle Thunder 03/19/2012 10:12 AM

## 2012-03-19 NOTE — Progress Notes (Addendum)
D Amma is seen out on the [address] this morning. She makes very brief eye contact. She whispers when she answers this nurse's questions, hesitating as she says " I'm going home today". She also appears to be responding to internal stimuli as she stands by herself in the hall...she continues to whisper-talk and noone is present.      A She is requested to attend her groups and to complete her AM self inventory sheet and she responds with a vague " ok, ".She appears to want to ask a question..then hesitates, as if she is thought-blocking.She denies Si to this nurse.       R  Safety is in place, POC moves forward with therapeutic relationship fostered PD RN [facility] 03/19/12  1615 Addendum Pt completed her self inventory as requested. On it she wrote she denied SI in the past 24 hrs, she rated her depression and hopelessness " 1 / 0 " and she wrote her DC plan is to " f/u with treatment plan". PD RN [facility]

## 2012-03-19 NOTE — Tx Team (Signed)
Interdisciplinary Treatment Plan Update (Adult)  Date:  03/19/2012  Time Reviewed:  10:15AM-11:15AM  Progress in Treatment: Attending groups:  Yes Participating in groups:    Not much Taking medication as prescribed:    No, refused her Geodon last night, saying she did not want an increase Tolerating medication:   Yes Family/Significant other contact made:  Yes Patient understands diagnosis:   No Discussing patient identified problems/goals with staff:   Yes, expressing her concern about medication increase Medical problems stabilized or resolved:   Yes Denies suicidal/homicidal ideation:  Yes, but did have an argument with sibling and/or mother yesterday (differing reports) Issues/concerns per patient self-inventory:   Patient wants discharge Other:    New problem(s) identified: Yes, Describe:  patient refused her Geodon last night, states she does not want the increased amount ordered yesterday by doctor  Reason for Continuation of Hospitalization: Hallucinations Medication stabilization Other; describe   paranoia, decreased sleep  Interventions implemented related to continuation of hospitalization:  Medication monitoring and adjustment, safety checks Q15 min., suicide risk assessment, group therapy, psychoeducation, collateral contact, aftercare planning, ongoing physician assessments, medication education  Additional comments:  Not applicable  Estimated length of stay:  3-4 days  Discharge Plan:  Return to live with mother, follow up with [facility]  New goal(s):  Not applicable  Review of initial/current patient goals per problem list:   1.  Goal(s):  Medication stabilization  Met:  No  Target date:  By Discharge   As evidenced by:  Ongoing  2.  Goal(s):  Return sleep to normal pattern of at least 6+ hours per night.  Met:  No  Target date:  By Discharge   As evidenced by:  States is sleeping good, but is documented as being 3.75 hours  3.  Goal(s):  Reduce  auditory/visual hallucinations to baseline.  Met:  No  Target date:  By Discharge   As evidenced by:  Reports no voices, but seems to be responding to internal stimuli  4.  Goal(s):  Decrease paranoia to baseline and to manageable level, where she can discharge without feeling threatened or feeling a need to threaten others.  Met:  No  Target date:  By Discharge   As evidenced by:  Still paranoid enough for her to be on a Do Not Admit  Attendees: Patient:  [name]  03/19/2012 10:15AM-11:15AM  Family:     Physician:   03/19/2012 10:15AM-11:15AM  Nursing:   Nestor Ramp, RN 03/19/2012 10:15AM -11:15AM   Case Manager:  Ambrose Mantle, LCSW 03/19/2012 10:15AM-11:15AM  Counselor:  Veto Kemps, MT-BC 03/19/2012 10:15AM-11:15AM  Other:   Verne Spurr, PA 03/19/2012   Other:   Clarice Pole, LPCA-A 03/19/2012   Other:      Other:       Scribe for Treatment Team:   Sarina Ser, 03/19/2012, 10:15AM-11:15AM

## 2012-03-19 NOTE — Progress Notes (Signed)
[facility] Group Notes:  (Counselor/Nursing/MHT/Case Management/Adjunct)  03/19/2012 9:36 PM  Type of Therapy:  wrap up  Participation Level:  Active  Participation Quality:  Appropriate  Affect:  Appropriate  Cognitive:  Appropriate  Insight:  Good   Engagement in Group:  Good  Engagement in Therapy:  Good  Modes of Intervention:  Education and Support  Summary of Progress/Problems: Cyriah was active in group tonight. She said her day was neutral because she thought she was going home and then didn't. Her mother's visit was a positive thing for her. Yasenia wants to be involved in her med decisions from now on. She also wants to be informed of her med decisions.   Nichola Sizer 03/19/2012, 9:36 PMThe focus of this group is to help patients review their daily goal of treatment and discuss progress on daily workbooks.

## 2012-03-20 MED ORDER — BENZTROPINE MESYLATE 1 MG PO TABS
1.0000 mg | ORAL_TABLET | Freq: Two times a day (BID) | ORAL | Status: DC
  Administered 2012-03-20 – 2012-03-22 (×4): 1 mg via ORAL
  Filled 2012-03-20 (×10): qty 1

## 2012-03-20 MED ORDER — BENZTROPINE MESYLATE 1 MG/ML IJ SOLN: 1.0000 mg | Freq: Two times a day (BID) | INTRAMUSCULAR | Status: DC

## 2012-03-20 NOTE — Progress Notes (Signed)
Patient ID: [name], female   DOB: February 11, 1986, 26 y.o.   MRN: [id number] [name]  26 y.o.  [id number] 05/10/1986  03/20/2012   Diagnosis: Schizoaffective disorder bipolar type  Subjective: The patient states that she slept good, records indicate otherwise.  She also refused her Geodon last night stating it was too much so she would not take it.  She since then argued with an older sibling. Since then she has been unable to be redirected.   Vital Signs:Blood pressure 111/73, pulse 92, temperature 98.1 F (36.7 C), temperature source Oral, resp. rate 16, height 5\' 2"  (1.575 m), weight 61.009 kg (134 lb 8 oz), last menstrual period 02/04/2012.   Objective: [name] is being less than forthcoming today in an effort to be discharged.  She is also  having some side to movement of her lower jaw as well as some tongue thrusting consistant with EPS vs Tardive dyskinesia.  She states that she has always done this.  Assessment: Unchanged dx.                        R/o EPS vs Tardive dyskinesia.  Medications Scheduled: . lithium carbonate  300 mg Oral QAC breakfast  . lithium carbonate  600 mg Oral QHS  . ziprasidone  120 mg Oral QHS  PRN Meds acetaminophen, alum & mag hydroxide-simeth, magnesium hydroxide  Plan: 1. Continue the Geodon of 120mg  as ordered.  If patient refuses then will get a foruce meds order. 2. Add cogentin for possible TD vs EPS. 3. Continue as planned.  Rona Ravens. [name] [facility] 03/20/2012 12:11 AM

## 2012-03-20 NOTE — Progress Notes (Signed)
Patient ID: [name], female   DOB: [birth year], 26 y.o.   MRN: [id number]  [facility] Group Notes:  (Counselor/Nursing/MHT/Case Management/Adjunct)  03/20/2012 11 AM  Type of Therapy:  Aftercare Planning, Group Therapy, Dance/Movement Therapy   Participation Level:  Minimal  Participation Quality:  Appropriate  Affect:  Anxious  Cognitive:  Appropriate and Oriented  Insight:  Limited  Engagement in Group:  Limited  Engagement in Therapy:  Limited  Modes of Intervention:  Clarification, Problem-solving, Role-play, Socialization and Support  Summary of Progress/Problems: After Care: Pt. attended and participated in aftercare planning group. Pt. accepted information on suicide prevention, warning signs to look for with suicide and crisis line numbers to use. The pt. agreed to call crisis line numbers if having warning signs or having thoughts of suicide. Pt. listed their current mood as feeling good like the color yellow. Counseling:  Therapist discussed the definition of self sabotage.Therapist asked group "What is your definition of self sabotage.?" and "Why do we tend to do things to sabotage ourselves?" Therapist discussed motives of sabotage and ways to turn negative thoughts into positive thoughts.  Therapist asked group to discuss self sabotaging mechanisms in their lives and how they turn the negative into positive.  Pt. stated, "I start thinking about all of the happy times in my life and not focus on the bad".  [name] 03/20/2012. 11:41 AM

## 2012-03-20 NOTE — Progress Notes (Signed)
[name]  26 y.o.  [id number] 07/23/1986  03/20/2012   Diagnosis: Axis I: Schizoaffective Disorder - Bipolar Type   Subjective: Pt. Did agree to take her medication last night.  Today she is much more alert, focused, and more oriented. Her eye contact is better.   She denies AH/VH, states that she is not suicidal, and is not homicidal. She reports no depression. And says there is no anxiety.  Vital Signs:Blood pressure 107/72, pulse 101, temperature 98 F (36.7 C), temperature source Oral, resp. rate 17, height 5\' 2"  (1.575 m), weight 61.009 kg (134 lb 8 oz), last menstrual period 02/04/2012.   Objective: [name] is speaking clearer, more goal directed.  There is less abnormal movement of her lower jaw, and she is showing more insight and improved judgement. Thought process appears to be linear, and content appears to be normal.  Assessment: symptoms improving with medication compliance.  Medications Scheduled:  . benztropine  1 mg Oral BID  . lithium carbonate  300 mg Oral QAC breakfast  . lithium carbonate  600 mg Oral QHS  . ziprasidone  120 mg Oral QHS  . DISCONTD: benztropine mesylate  1 mg Intramuscular BID     PRN Meds acetaminophen, alum & mag hydroxide-simeth, magnesium hydroxide  Plan: Continue current plan of care with no changes at this time. Pt. Agreed to continue the Geodon at this dose, but is not sure she will try the cogentin.  Rona Ravens. Javian Nudd [facility] 03/20/2012 4:07 PM

## 2012-03-20 NOTE — Progress Notes (Signed)
Psychoeducational Group Note  Date:  03/20/2012 Time:  0945 am  Group Topic/Focus:  Identifying Needs:   The focus of this group is to help patients identify their personal needs that have been historically problematic and identify healthy behaviors to address their needs.  Participation Level:  Did Not Attend    Andrena Mews 03/20/2012,10:49 AM

## 2012-03-20 NOTE — Progress Notes (Signed)
D Season is seen out in the milieu this morning...she is quiet...stands around watching the other patients. She speaks in a whisper-like voice with this nurse. She is guarded...reluctant to answer this nurse's questions this morning and then she became  Even more guarded when she was given morning dose of po cogentin ...she eventually refused to take it, saying over and over " I'm not supposed to take that medicine"....      A She completes her self inventory and on it she wrote she denied SI in the past 24 hrs, she rated her depression and hopelessness " 1 / 0" and she wrote her DC plan is to : " follow up on my treatment plan".      R Safety is in place and POC cont  With therapeutic relationship fostered.PD RN [facility]

## 2012-03-20 NOTE — Progress Notes (Signed)
Patient ID: [name], female   DOB: [birth year], 26 y.o.   MRN: [id number] D. The patient attended evening group. Stated her coping strategy was to stay away from people who bother her. Her goal was to follow her treatment plan so she can go home. Stated she was going to take all her medication. A. Praise and support given for verbalizing her goal and encouragement given to follow through with her plan. H.S. medications administered. R. Took her H.S. medications with a lot of encouragement. After she finally took her medications walked rapidly to her room. Staff followed and stayed with her for 30 minutes to make sure she didn't spit out meds.

## 2012-03-21 NOTE — Progress Notes (Signed)
Patient ID: [name], female   DOB: April 13, 1986, 26 y.o.   MRN: [id number] D. The patient was more connected and involved in the milieu this evening. Actively participated in a group recreational board game. Attended and actively participated in evening wrap up group and expressed that she was going to continue to follow her treatment plan so that she could go home. Came on her own to the medication window looking for her H.S. Medications. A. Lots of praise and encouragement given to continue participating in activities, taking medication and following her treatment plan. Review of medications given.  R. Although she came for medications on her own, needed a lot of encouragement to actually take and swallow the medications. Walked immediately to her room after taking medication. Staff escorted her to her room and stayed with her for 30 minutes to assure she was not checking or spitting up medication.

## 2012-03-21 NOTE — Progress Notes (Signed)
**Note De-Identified Monique Obfuscation** [facility] Group Notes:  (Counselor/Nursing/MHT/Case Management/Adjunct)  03/21/2012 8:00PM Type of Therapy:  Group Therapy  Participation Level:  Minimal  Participation Quality:  Appropriate  Affect:  Flat  Cognitive:  Alert and Oriented  Insight:  Limited  Engagement in Group:  Limited  Engagement in Therapy:  Limited  Modes of Intervention:  Problem-solving and Support  Summary of Progress/Problems:  Pt reported that she would like to discharge on Monday. Pt stated that she had an all right day because she was able to see her mother.  [name], Randal Buba 03/21/2012, 8:54 PM

## 2012-03-21 NOTE — Progress Notes (Signed)
Psychoeducational Group Note  Date:  03/21/2012 Time:  1515  Group Topic/Focus:  Goals Group:   The focus of this group is to help patients establish daily goals to achieve during treatment and discuss how the patient can incorporate goal setting into their daily lives to aide in recovery.  Participation Level:  Minimal  Participation Quality:  Appropriate and Attentive  Affect:  Appropriate  Cognitive:  Appropriate  Insight:  Good  Engagement in Group:  Good  Additional Comments:  Pt participated and processed in group.  Marquis Lunch, Kizer Nobbe 03/21/2012, 4:23 PM

## 2012-03-21 NOTE — Progress Notes (Signed)
Patient ID: [name], female   DOB: 21-Apr-1986, 26 y.o.   MRN: [id number]  [facility] Group Notes:  (Counselor/Nursing/MHT/Case Management/Adjunct)  03/21/2012 11 AM  Type of Therapy:  Aftercare Planning, Group Therapy, Dance/Movement Therapy   Participation Level:  Did Not Attend   [name] 03/21/2012. 12:04 PM

## 2012-03-21 NOTE — Progress Notes (Signed)
[facility] Group Notes:  (Counselor/Nursing/MHT/Case Management/Adjunct)  03/21/2012 3:26 AM  Type of Therapy:  wrap-up  Participation Level:  Minimal  Participation Quality:  Appropriate  Affect:  Appropriate  Cognitive:  Appropriate  Insight:  Good  Engagement in Group:  Good  Engagement in Therapy:  Good  Modes of Intervention:  Education  Summary of Progress/Problems:Patient attended and participated in group this evening. She reports that her day went well.  She worked on her treatment plan which is to take her medication. She attended all her groups. The patient advised that whenever she gets stressed she listen to music, read or take herself away from the situation.    Lita Mains [facility] 03/21/2012, 3:26 AM

## 2012-03-21 NOTE — Progress Notes (Signed)
[name] is seen at the med window this morning. She takes her cogentin and lithium from the night nurse and then goes immediately to her room. MHT is requested to observe her ( to assure she is not cheeking her medications) and this is confirmed ( after 30 minutes) that she is not.      A She is requested by this nurse to complete her AM self inventory. She asks when she will be going home and she is advised that MD will be able to speak with her today about her estimated LOS.      R Safety is in place. She remains avoidant.Superficial. Cont to respond to internal stimuli...seen whispering to herself in her room  After breakfast. POC in place and safety maintained PD RN [facility]

## 2012-03-21 NOTE — Progress Notes (Signed)
Psychoeducational Group Note  Date:  03/21/2012 Time:  0945 am  Group Topic/Focus:  Making Healthy Choices:   The focus of this group is to help patients identify negative/unhealthy choices they were using prior to admission and identify positive/healthier coping strategies to replace them upon discharge.  Participation Level:  Minimal  Participation Quality:  Attentive  Affect:  Blunted  Cognitive:  Alert  Insight:  Limited  Engagement in Group:  Limited  Additional Comments: pt was attentive during group but did not share  Andrena Mews 03/21/2012, 10:29 AM

## 2012-03-21 NOTE — Progress Notes (Signed)
[facility] MD Progress Note  03/21/2012 11:39 AM  S: "I am doing well. My mood is good. I am sleeping well. No voices, not all. No, I am not having any thoughts of suicide and or homicide"  Diagnosis:   Axis I: Schizoaffective disorder, bipolar type Axis II: Deferred Axis III:  Past Medical History  Diagnosis Date  . Bipolar 1 disorder    Axis IV: other psychosocial or environmental problems Axis V: 61-70 mild symptoms  ADL's:  Intact  Sleep: Good  Appetite:  Good  Suicidal Ideation:  Plan:  No Intent:  No Means:  No Homicidal Ideation:  Plan:  No Intent:  no Means:  No  AEB (as evidenced by): Per patient's reports  Mental Status Examination/Evaluation: Objective:  Appearance: Casual  Eye Contact::  Good  Speech:  Clear and Coherent  Volume:  Normal  Mood:  Euthymic  Affect:  Flat  Thought Process:  Coherent  Orientation:  Full  Thought Content:  Rumination  Suicidal Thoughts:  No  Homicidal Thoughts:  No  Memory:  Immediate;   Good Recent;   Good Remote;   Good  Judgement:  Fair  Insight:  Fair  Psychomotor Activity:  Normal  Concentration:  Good  Recall:  Good  Akathisia:  No  Handed:  Right  AIMS (if indicated):     Assets:  Desire for Improvement  Sleep:  Number of Hours: 6.75    Vital Signs:Blood pressure 123/86, pulse 93, temperature 97.7 F (36.5 C), temperature source Oral, resp. rate 20, height 5\' 2"  (1.575 m), weight 61.009 kg (134 lb 8 oz), last menstrual period 02/04/2012. Current Medications: Current Facility-Administered Medications  Medication Dose Route Frequency Provider Last Rate Last Dose  . acetaminophen (TYLENOL) tablet 650 mg  650 mg Oral Q6H PRN Jorje Guild, PA-C      . alum & mag hydroxide-simeth (MAALOX/MYLANTA) 200-200-20 MG/5ML suspension 30 mL  30 mL Oral Q4H PRN Jorje Guild, PA-C      . benztropine (COGENTIN) tablet 1 mg  1 mg Oral BID Verne Spurr, PA-C   1 mg at 03/21/12 0740  . lithium carbonate capsule 300 mg  300 mg Oral QAC  breakfast Curlene Labrum Readling, MD   300 mg at 03/21/12 0740  . lithium carbonate capsule 600 mg  600 mg Oral QHS Curlene Labrum Readling, MD   600 mg at 03/20/12 2203  . magnesium hydroxide (MILK OF MAGNESIA) suspension 30 mL  30 mL Oral Daily PRN Jorje Guild, PA-C      . ziprasidone (GEODON) capsule 120 mg  120 mg Oral QHS Nelly Rout, MD   120 mg at 03/20/12 2204    Lab Results: No results found for this or any previous visit (from the past 48 hour(s)).  Physical Findings: AIMS: Facial and Oral Movements Muscles of Facial Expression: Minimal Lips and Perioral Area: Minimal Jaw: None, normal Tongue: None, normal,Extremity Movements Upper (arms, wrists, hands, fingers): None, normal Lower (legs, knees, ankles, toes): None, normal, Trunk Movements Neck, shoulders, hips: None, normal, Overall Severity Severity of abnormal movements (highest score from questions above): None, normal Incapacitation due to abnormal movements: None, normal Patient's awareness of abnormal movements (rate only patient's report): No Awareness, Dental Status Current problems with teeth and/or dentures?: No Does patient usually wear dentures?: No  CIWA:  CIWA-Ar Total: 5  COWS:     Treatment Plan Summary: Daily contact with patient to assess and evaluate symptoms and progress in treatment Medication management  Plan: Continue current treatment  plan.  Armandina Stammer I 03/21/2012, 11:39 AM

## 2012-03-22 MED ORDER — BENZTROPINE MESYLATE 1 MG PO TABS
1.0000 mg | ORAL_TABLET | ORAL | Status: DC
  Administered 2012-03-22: 1 mg via ORAL
  Filled 2012-03-22 (×4): qty 1

## 2012-03-22 NOTE — Progress Notes (Signed)
03/22/2012         Time: 0930      Group Topic/Focus: The focus of this group is on discussing various aspects of wellness, balancing those aspects and exploring ways to increase the ability to experience wellness.  Participation Level: Minimal  Participation Quality: Attentive  Affect: Blunted  Cognitive: Alert  Additional Comments: Patient flat, isolative, chiming in with appropriate answers at times.   Jacalyn Biggs 03/22/2012 12:34 PM

## 2012-03-22 NOTE — Progress Notes (Signed)
D Rikki is seen out in the milieu today.Marland KitchenMarland KitchenShe takes her meds reluctantly ( after breakfast) and cont to isolate to her room...avoiding eye contact with this Clinical research associate.Marland KitchenMarland Kitchen

## 2012-03-22 NOTE — Progress Notes (Signed)
[facility] Group Notes:  (Counselor/Nursing/MHT/Case Management/Adjunct)  03/22/2012 2:20 PM  Type of Therapy:  Group Therapy  Participation Level:  Active  Participation Quality:  Attentive and Sharing  Affect:  Appropriate and Excited  Cognitive:  Oriented  Insight:  Good  Engagement in Group:  Good  Engagement in Therapy:  Good  Modes of Intervention:  Education, Exploration  Summary of Progress/Problems: Patient showed a lot of improvement from last week. She was engaged and able to disagree with a patient who was loud and very negative. She confronted him about calling the race card and was able to state her opinion about opportunities. Patient was not hallucinating during the group.  ,  [name] 03/22/2012, 2:20 PM

## 2012-03-22 NOTE — [facility] Suicide Risk Assessment (Signed)
Suicide Risk Assessment  Discharge Assessment     Demographic factors:  Low socioeconomic status;Adolescent or young adult  Current Mental Status Per Nursing Assessment::    On Admission:   (Currently denies SI) At Discharge:  The patient was seen today and is Alert and Oriented x 3.  The patient reports that she is sleeping well and reports a good appetite. She denies any significant feelings of sadness, anhedonia or depressed mood as well as any anxiety symptoms.  She adamantly denies any suicidal or homicidal ideations and also denies any auditory or visual hallucinations or delusional thinking. The patient states that she is ready to go home today and this will be considered after obtaining collateral information from her Mother.   Current Mental Status Per Physician:  Diagnosis:  Axis I: Schizoaffective Disorder - Bipolar Type.  The patient was seen today and reports the following:   ADL's: Intact.  Sleep: The patient reports that she is sleeping well at night..  Appetite: The patient reports a good appetite this morning.   Mild>(1-10) >Severe  Hopelessness (1-10): 0  Depression (1-10): 0  Anxiety (1-10): 0   Suicidal Ideation: The patient adamantly denies any suicidal ideations today.  Plan: No  Intent: No  Means: No   Homicidal Ideation: The patient adamantly denies any homicidal ideations today.  Plan: No  Intent: No.  Means: No   General Appearance/Behavior: The patient was friendly and cooperative today with this provider.  Eye Contact: Good.  Speech: Appropriate in rate and volume with no pressuring noted today.  Motor Behavior: wnl.  Level of Consciousness: Alert and Oriented x 3.  Mental Status: Alert and Oriented x 3.  Mood: Essentially Euthymic.  Affect: Appears bright and full.  Anxiety Level: No anxiety reported today.  Thought Process: wnl.  Thought Content: The patient denies any auditory or visual hallucinations today as well as any delusional  thinking.  However, staff states the patient has been observed talking to herself. Perception: wnl.  Judgment: Good.  Insight: Good.  Cognition: Oriented to person, place and time.   Loss Factors:  (family conflict)  Historical Factors: Family history of mental illness or substance abuse  Risk Reduction Factors:   Good Family Support.  Good Access to Healthcare.  Continued Clinical Symptoms:  Previous Psychiatric Diagnoses and Treatments Schizoaffective Disorder - Bipolar Type.  Discharge Diagnoses:   AXIS I:   Schizoaffective Disorder - Bipolar Type. AXIS II:   Deferred. AXIS III:   No Acute Illnesses Noted. AXIS IV:   Chronic Mental Illness.  Non-compliance with Medications. AXIS V:   GAF at time of admission approximately 35.  GAF at time of discharge approximately 55.  Cognitive Features That Contribute To Risk:  None Noted.   Current Medications:    . benztropine  1 mg Oral BH-qamhs  . lithium carbonate  300 mg Oral QAC breakfast  . lithium carbonate  600 mg Oral QHS  . ziprasidone  120 mg Oral QHS  . DISCONTD: benztropine  1 mg Oral BID   Review of Systems:  Neurological: The patient denies any headaches today. She denies any seizures or dizziness.  G.I.: The patient denies any constipation or G.I. Upset today.  Musculoskeletal: The patient denies any musculoskeletal issues today.   Treatment Plan Summary:  1. Daily contact with patient to assess and evaluate symptoms and progress in treatment.  2. Medication management  3. The patient will deny suicidal ideations or homicidal ideations for 48 hours prior to discharge and  have a depression and anxiety rating of 3 or less. The patient will also deny any auditory or visual hallucinations or delusional thinking.  4. The patient will deny any symptoms of substance withdrawal at time of discharge.   The patient was seen today and is Alert and Oriented x 3.  The patient reports that she is sleeping well and reports a  good appetite. She denies any significant feelings of sadness, anhedonia or depressed mood as well as any anxiety symptoms.  She adamantly denies any suicidal or homicidal ideations and also denies any auditory or visual hallucinations or delusional thinking. The patient states that she is ready to go home today and this will be considered after obtaining collateral information from her Mother.   Of note, the patient's Mother contacted [facility] and stated she would prefer the patient have one more day in the hospital for further stabilization.  The patient will be reassessed in the morning for reconsideration of possible discharge.  Plan:  1. Will continue the patient on her current medications as listed above.  2. Will continue to monitor.  3. Laboratory studies reviewed.  4. Serum Lithium Level ordered. 5. Will consider discharge tomorrow.   Suicide Risk:  Minimal: No identifiable suicidal ideation.  Patients presenting with no risk factors but with morbid ruminations; may be classified as minimal risk based on the severity of the depressive symptoms  Plan Of Care/Follow-up recommendations:  Activity:  As tolerated. Diet:  Regular Diet. Other:  Please take all medications only as directed and keep all scheduled follow up appointments.  Champagne Paletta 03/22/2012, 4:54 PM

## 2012-03-22 NOTE — Progress Notes (Signed)
[facility] Group Notes:  (Counselor/Nursing/MHT/Case Management/Adjunct)  03/22/2012 9:49 PM  Type of Therapy:  wrap-up  Participation Level:  Active  Participation Quality:  Appropriate  Affect:  Flat  Cognitive:  Appropriate  Insight:  Good  Engagement in Group:  Good  Engagement in Therapy:  Good  Modes of Intervention:  Education  Summary of Progress/Problems:Patient states that she had a pretty good day and is hoping to go home tomorrow   [facility] 03/22/2012, 9:49 PM

## 2012-03-22 NOTE — Discharge Planning (Signed)
03/22/2012  SW met with [name] in discharge planning group.  SW found Trudie P Jose to have adequate transportation upon discharge.  SW found Desiree P Plessinger to need contacts made on their behalf to her mother to discuss current progress to assess baseline of symptoms in planning for discharge.  SW found Tryniti P Stroope has current service providers, and they are Water engineer.  SW found Keelyn P Mattie to rate her depression at 0/10 and anxiety at 0/10 today in efforts to get home.  Pt. Appears to continue to converse with internal stimuli and some motor symptoms(tardive dyskinesia) appear to be improving.  SW will continue to assess for referrals.  Clarice Pole, LCASA 03/22/2012, 4:01 PM

## 2012-03-22 NOTE — Progress Notes (Signed)
Patient ID: [name], female   DOB: 28-Aug-1985, 26 y.o.   MRN: [id number] D: Pt. Denies SHI, reports no AVH, but preoccupied. Pt. Concerned about meds. A: Writer reviewed med with client. Pt. Able to name meds she will be taking at bedtime, although a little resistant about taking another Cogentin. Writer also discussed labs to be drawn tonight for lithium level. Staff will monitor for safety q29min. R: Pt. Agrees that she will take meds tonight. Pt. Had labs drawn. Pt. Remains safe on the unit.

## 2012-03-22 NOTE — Progress Notes (Signed)
Psychoeducational Group Note  Date:  03/22/2012 Time:  1100  Group Topic/Focus:  Self Care:   The focus of this group is to help patients understand the importance of self-care in order to improve or restore emotional, physical, spiritual, interpersonal, and financial health.  Participation Level:  Did Not Attend  Participation Quality:    Affect:    Cognitive:    Insight:    Engagement in Group:    Additional Comments:  Pt was in the shower and just refused to attend.  Isla Pence M 03/22/2012, 1:27 PM

## 2012-03-23 MED ORDER — LITHIUM CARBONATE 300 MG PO CAPS: ORAL_CAPSULE | ORAL | Status: DC

## 2012-03-23 MED ORDER — BENZTROPINE MESYLATE 1 MG PO TABS: 1.0000 mg | ORAL_TABLET | ORAL | Status: DC

## 2012-03-23 MED ORDER — ZIPRASIDONE HCL 60 MG PO CAPS: 120.0000 mg | ORAL_CAPSULE | Freq: Every day | ORAL | Status: DC

## 2012-03-23 MED ORDER — LITHIUM CARBONATE 300 MG PO CAPS
300.0000 mg | ORAL_CAPSULE | Freq: Every day | ORAL | Status: DC
  Filled 2012-03-23: qty 1

## 2012-03-23 NOTE — [facility] Suicide Risk Assessment (Signed)
Suicide Risk Assessment  Discharge Assessment      Demographic factors:  Low socioeconomic status;Adolescent or young adult   Current Mental Status Per Nursing Assessment::   On Admission: (Currently denies SI)  At Discharge: The patient was seen today and is Alert and Oriented x 3. The patient reports that she is sleeping well and reports a good appetite. She denies any significant feelings of sadness, anhedonia or depressed mood as well as any anxiety symptoms. She adamantly denies any suicidal or homicidal ideations and also denies any auditory or visual hallucinations or delusional thinking. The patient states that she is ready to go home today and according to Case Management her Mother also agrees with discharge today.    Much time was spent discussing with the patient the need to take medications and keep follow up appointments after discharge and the patient agreed.  Current Mental Status Per Physician:   Diagnosis:  Axis I: Schizoaffective Disorder - Bipolar Type.   The patient was seen today and reports the following:   ADL's: Intact.  Sleep: The patient reports that she is sleeping well at night..  Appetite: The patient reports a good appetite this morning.   Mild>(1-10) >Severe  Hopelessness (1-10): 0  Depression (1-10): 0  Anxiety (1-10): 0   Suicidal Ideation: The patient adamantly denies any suicidal ideations today.  Plan: No  Intent: No  Means: No   Homicidal Ideation: The patient adamantly denies any homicidal ideations today.  Plan: No  Intent: No.  Means: No   General Appearance/Behavior: The patient was friendly and cooperative today with this provider.  Eye Contact: Good.  Speech: Appropriate in rate and volume with no pressuring noted today.  Motor Behavior: wnl.  Level of Consciousness: Alert and Oriented x 3.  Mental Status: Alert and Oriented x 3.  Mood: Essentially Euthymic.  Affect: Appears bright and full.  Anxiety Level: No anxiety  reported today.  Thought Process: wnl.  Thought Content: The patient denies any auditory or visual hallucinations today as well as any delusional thinking.   Perception: wnl.  Judgment: Good.  Insight: Good.  Cognition: Oriented to person, place and time.   Loss Factors:  (family conflict)   Historical Factors:  Family history of mental illness or substance abuse   Risk Reduction Factors:  Good Family Support. Good Access to Healthcare.   Continued Clinical Symptoms:  Previous Psychiatric Diagnoses and Treatments  Schizoaffective Disorder - Bipolar Type.   Discharge Diagnoses:  AXIS I: Schizoaffective Disorder - Bipolar Type.  AXIS II: Deferred.  AXIS III: No Acute Illnesses Noted.  AXIS IV: Chronic Mental Illness. Non-compliance with Medications.  AXIS V: GAF at time of admission approximately 35. GAF at time of discharge approximately 55.   Cognitive Features That Contribute To Risk:  None Noted.   Current Medications:   .  benztropine  1 mg  Oral  BH-qamhs   .  lithium carbonate  300 mg  Oral  QAC breakfast   .  lithium carbonate  600 mg  Oral  QHS   .  ziprasidone  120 mg  Oral  QHS   .  DISCONTD: benztropine  1 mg  Oral  BID    Review of Systems:  Neurological: The patient denies any headaches today. She denies any seizures or dizziness.  G.I.: The patient denies any constipation or G.I. Upset today.  Musculoskeletal: The patient denies any musculoskeletal issues today.   Treatment Plan Summary:  1. Daily contact with patient to  assess and evaluate symptoms and progress in treatment.  2. Medication management  3. The patient will deny suicidal ideations or homicidal ideations for 48 hours prior to discharge and have a depression and anxiety rating of 3 or less. The patient will also deny any auditory or visual hallucinations or delusional thinking.  4. The patient will deny any symptoms of substance withdrawal at time of discharge.   The patient was seen today  and is Alert and Oriented x 3. The patient reports that she is sleeping well and reports a good appetite. She denies any significant feelings of sadness, anhedonia or depressed mood as well as any anxiety symptoms. She adamantly denies any suicidal or homicidal ideations and also denies any auditory or visual hallucinations or delusional thinking. The patient states that she is ready to go home today and according to Case Management her Mother also agrees with discharge today.    Much time was spent discussing with the patient the need to take medications and keep follow up appointments after discharge and the patient agreed.  Plan:  1. Will continue the patient on her current medications as listed above.  2. Will continue to monitor.  3. Laboratory studies reviewed.  4. Discharge today to outpatient follow up.   Suicide Risk:  Minimal: No identifiable suicidal ideation. Patients presenting with no risk factors but with morbid ruminations; may be classified as minimal risk based on the severity of the depressive symptoms   Plan Of Care/Follow-up recommendations:  Activity: As tolerated.  Diet: Regular Diet.  Other: Please take all medications only as directed and keep all scheduled follow up appointments.  [name] 03/23/2012, 12:30 PM

## 2012-03-23 NOTE — Progress Notes (Signed)
Adult Services Patient-Family Contact/Session  Attendees:  Patient's mother, [name] ([phone])  Goal(s):  Discharge planning   Safety Concerns:  None   Narrative:  Talked with mother on 7/29 and again this morning about patient's discharge. She stated that she was doing much better over the weekend and appeared to be getting close to baseline. After treatment team, discussed with mother that staff had continued to see patient talking to herself and responding to internal stimuli. She agreed with staff that patient stay at least one more day to decrease the voices.  On 7/30, mother called and was concerned that patient had discussed with her going off her medications when she was having children. This made mother think that patient would go off medications when she left. Assured mother that this would be discussed in treatment team with patient.  Counselor confronted patient about her wanting to go off medications in treatment team. Pointed out to her that she is not currently in a relationship, and when the time comes to have children, she can talk with her doctor about medications that would be safe.   Barrier(s):  Patient's lack of insight and motivation  Interventions:  Education, support  Recommendation(s):  Outpatient follow-up  Follow-up Required:  No  Explanation:    [name] 03/23/2012, 3:42 PM

## 2012-03-23 NOTE — Tx Team (Signed)
Interdisciplinary Treatment Plan Update (Adult) Date: 03/23/2012  Time Reviewed: 10:07 AM  Progress in Treatment: Attending groups: Yes Participating in groups: Yes Taking medication as prescribed: Yes Tolerating medication: Yes Family/Significant othe contact made: Counselor spoke with mother Patient understands diagnosis: Yes Discussing patient identified problems/goals with staff: Yes Medical problems stabilized or resolved: Yes Denies suicidal/homicidal ideation: Yes Issues/concerns per patient self-inventory: None identified Other: N/A New problem(s) identified: None Identified Reason for Continuation of Hospitalization: Other; describe Pt to discharge today Interventions implemented related to continuation of hospitalization: mood stabilization, medication monitoring and adjustment, group therapy and psycho education, safety checks q 15 mins Additional comments: N/A Estimated length of stay:  Discharge Plan: SW is assessing for appropriate referrals.  New goal(s): N/A Review of initial/current patient goals per problem list:  1. Goal(s): Medication stabilization  Met: Yes Target date: By Discharge  As evidenced by: Ongoing 2. Goal(s): Return sleep to normal pattern of at least 6+ hours per night.  Met: Yes Target date: By Discharge  As evidenced by: States is sleeping good, but is documented as being 3.75 hours 3. Goal(s): Reduce auditory/visual hallucinations to baseline.  Met: Yes  Target date: By Discharge  As evidenced by: Reports no voices, but seems to be responding to internal stimuli 4. Goal(s): Decrease paranoia to baseline and to manageable level, where she can discharge without feeling threatened or feeling a need to threaten others.  Met: Yes  Target date: By Discharge  As evidenced by: Improved  Attendees: Patient: [name]   Family:    Physician: Franchot Gallo, MD 03/23/2012 10:07 AM   Nursing: Garnette Czech   Case Manager: Clarice Pole,  LCASA 03/23/2012 10:07 AM   Counselor: Veto Kemps, MT-BC 03/23/2012 10:07 AM   Other:   Other:    Other:    Other:    Scribe for Treatment Team:  Clarice Pole, LCASA 03/23/2012 10:07 AM

## 2012-03-23 NOTE — Progress Notes (Signed)
[facility] Group Notes:  (Counselor/Nursing/MHT/Case Management/Adjunct)  03/23/2012 3:36 PM  Type of Therapy:  Group Therapy  Participation Level:  Active  Participation Quality:  Attentive and Sharing  Affect:  Blunted  Cognitive:  Oriented  Insight:  Limited  Engagement in Group:  Good  Engagement in Therapy:  Limited  Modes of Intervention:  Education, Problem-solving and Support  Summary of Progress/Problems: Patient was less willing to discuss her plans today. 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    HartisAram Beecham 03/23/2012, 3:36 PM

## 2012-03-23 NOTE — Progress Notes (Signed)
Pt. discharged home --mother picked her up. Pt. denies any SI/HI or A/V hallucinations.  RN reviewed her follow up appointment with her as well as discharge medications, prescriptions with voiced understanding.  She obtained all belongings out of her room and locker # 11 prior to leaving.  Pt. pleasant upon discharge and happy to be going home.

## 2012-03-23 NOTE — Discharge Planning (Signed)
03/23/2012  Narrative: Pt did not attend d/c planning group on this date. SW met with pt individually at this time. Pt. Stated she felt well and ready to go home.  Pt. Appeared to be stable and present with no paranoia, no abnormal motor activity.  Affect: Good Insight: Good Judgment: Good  Pt. Planning To discharge home today upon follow-up with her mother

## 2012-03-25 NOTE — Progress Notes (Signed)
Patient Discharge Instructions:  After Visit Summary (AVS):   Faxed to:  03/25/2012 Psychiatric Admission Assessment Note:   Faxed to:  03/25/2012 Suicide Risk Assessment - Discharge Assessment:   Faxed to:  03/25/2012 Faxed/Sent to the Next Level Care provider:  03/25/2012  Faxed to [facility] [place] - Dr. Ladona Ridgel @ [phone]  Wandra Scot, 03/25/2012, 4:02 PM

## 2012-04-11 NOTE — Discharge Summary (Signed)
Physician Discharge Summary Note  Patient:  [name] is an 26 y.o., female MRN:  [id number] DOB:  06/25/1986 Patient phone:  [phone] (home)  Patient address:   [address] [place] [address]   Date of Admission:  03/10/2012 Date of Discharge: 03/23/2012  Discharge Diagnoses: Principal Problem:  *Schizoaffective disorder, bipolar type  Axis Diagnosis:  AXIS I: Schizoaffective Disorder - Bipolar Type.  AXIS II: Deferred.  AXIS III: No Acute Illnesses Noted.  AXIS IV: Chronic Mental Illness. Non-compliance with Medications.  AXIS V: GAF at time of admission approximately 35. GAF at time of discharge approximately 55.   Level of Care:  Inpatient  Reason for Admission:   This is a 26 year old African-American female, admitted to [facility] from the [facility] ED with complaints of paranoid behavior per patient's mother. Patient reports, "I came to this hospital because my mama said that I was paranoid.I would like to know why she thought so. I feel like something is going wrong, when I think like that, it is possible that it will happen. Like I have this pant, I am wearing it. But I feel weird because I don't have the proper under garment you know. I want to go home, but my mama does not want me to do just that. It is not even a long drive, about 10 minutes at most. She said that I should take my Lithium, but the Lithium belongs to me, does she want it to herself or something? Well, I ate breakfast. I have my clothes on".  Hospital Course: Patient attended treatment team meeting this am and met with treatment team members. Pt symptoms, treatment plan and response to treatment discussed. The patient endorsed that their symptoms have improved. Pt also stated that they are stable for discharge.  They reported that from this hospital stay they had learned many coping skills.  In other to maintain psychiatric stability, they will continue psychiatric care on  outpatient basis. They will follow-up as outlined below.  In addition they were instructed to take all your medications as prescribed by their mental healthcare provider, to report any adverse effects and or reactions from your medicines to your outpatient provider promptly.  Patient is instructed and cautioned to not engage in alcohol and or illegal drug use while on prescription medicines, in the event of worsening symptoms, patient is instructed to call the crisis hotline, 911 and or go to the nearest ED for appropriate evaluation and treatment of symptoms.  Upon discharge, patient adamantly denies suicidal, homicidal ideations, auditory, visual hallucinations and or delusional thinking. They left [facility] with all personal belongings via personal transportation in no apparent distress.  While a patient in this hospital, the patient received medication management for his psychiatric symptoms. They were ordered and received the following:   Medication List  As of 04/11/2012  3:54 PM   TAKE these medications      Indication    benztropine 1 MG tablet   Commonly known as: COGENTIN   Take 1 tablet (1 mg total) by mouth 2 (two) times daily in the am and at bedtime.. For side effects.    Indication: Extrapyramidal Reaction caused by Medications      lithium carbonate 300 MG capsule   Take one capsule with Breakfast (300mg ), and take two capsules at bedtime. (600)mg for mood stabilization.    Indication: Manic-Depression      ziprasidone 60 MG capsule   Commonly  known as: GEODON   Take 2 capsules (120 mg total) by mouth at bedtime. For psychosis.    Indication: Schizophrenia           They were also enrolled in group counseling sessions and activities in which they participated actively.   Demographic factors:  Low socioeconomic status;Adolescent or young adult   Current Mental Status Per Nursing Assessment::  On Admission: (Currently denies SI)   At Discharge: The patient was seen today and  is Alert and Oriented x 3. The patient reports that she is sleeping well and reports a good appetite. She denies any significant feelings of sadness, anhedonia or depressed mood as well as any anxiety symptoms. She adamantly denies any suicidal or homicidal ideations and also denies any auditory or visual hallucinations or delusional thinking. The patient states that she is ready to go home today and according to Case Management her Mother also agrees with discharge today.  Much time was spent discussing with the patient the need to take medications and keep follow up appointments after discharge and the patient agreed.   Current Mental Status Per Physician:  Diagnosis:  Axis I: Schizoaffective Disorder - Bipolar Type.   The patient was seen today and reports the following:   ADL's: Intact.  Sleep: The patient reports that she is sleeping well at night..  Appetite: The patient reports a good appetite this morning.   Mild>(1-10) >Severe  Hopelessness (1-10): 0  Depression (1-10): 0  Anxiety (1-10): 0   Suicidal Ideation: The patient adamantly denies any suicidal ideations today.  Plan: No  Intent: No  Means: No   Homicidal Ideation: The patient adamantly denies any homicidal ideations today.  Plan: No  Intent: No.  Means: No   General Appearance/Behavior: The patient was friendly and cooperative today with this provider.  Eye Contact: Good.  Speech: Appropriate in rate and volume with no pressuring noted today.  Motor Behavior: wnl.  Level of Consciousness: Alert and Oriented x 3.  Mental Status: Alert and Oriented x 3.  Mood: Essentially Euthymic.  Affect: Appears bright and full.  Anxiety Level: No anxiety reported today.  Thought Process: wnl.  Thought Content: The patient denies any auditory or visual hallucinations today as well as any delusional thinking.  Perception: wnl.  Judgment: Good.  Insight: Good.  Cognition: Oriented to person, place and time.   Loss Factors:   (family conflict)   Historical Factors:  Family history of mental illness or substance abuse   Risk Reduction Factors:  Good Family Support. Good Access to Healthcare.   Continued Clinical Symptoms:  Previous Psychiatric Diagnoses and Treatments  Schizoaffective Disorder - Bipolar Type.   Discharge Diagnoses:  AXIS I: Schizoaffective Disorder - Bipolar Type.  AXIS II: Deferred.  AXIS III: No Acute Illnesses Noted.  AXIS IV: Chronic Mental Illness. Non-compliance with Medications.  AXIS V: GAF at time of admission approximately 35. GAF at time of discharge approximately 55.   Cognitive Features That Contribute To Risk:  None Noted.   Current Medications:  .  benztropine  1 mg  Oral  BH-qamhs   .  lithium carbonate  300 mg  Oral  QAC breakfast   .  lithium carbonate  600 mg  Oral  QHS   .  ziprasidone  120 mg  Oral  QHS   .  DISCONTD: benztropine  1 mg  Oral  BID    Review of Systems:  Neurological: The patient denies any headaches today. She denies any  seizures or dizziness.  G.I.: The patient denies any constipation or G.I. Upset today.  Musculoskeletal: The patient denies any musculoskeletal issues today.   Treatment Plan Summary:  1. Daily contact with patient to assess and evaluate symptoms and progress in treatment.  2. Medication management  3. The patient will deny suicidal ideations or homicidal ideations for 48 hours prior to discharge and have a depression and anxiety rating of 3 or less. The patient will also deny any auditory or visual hallucinations or delusional thinking.  4. The patient will deny any symptoms of substance withdrawal at time of discharge.   The patient was seen today and is Alert and Oriented x 3. The patient reports that she is sleeping well and reports a good appetite. She denies any significant feelings of sadness, anhedonia or depressed mood as well as any anxiety symptoms. She adamantly denies any suicidal or homicidal ideations and also  denies any auditory or visual hallucinations or delusional thinking. The patient states that she is ready to go home today and according to Case Management her Mother also agrees with discharge today.  Much time was spent discussing with the patient the need to take medications and keep follow up appointments after discharge and the patient agreed.   Plan:  1. Will continue the patient on her current medications as listed above.  2. Will continue to monitor.  3. Laboratory studies reviewed.  4. Discharge today to outpatient follow up.   Suicide Risk:  Minimal: No identifiable suicidal ideation. Patients presenting with no risk factors but with morbid ruminations; may be classified as minimal risk based on the severity of the depressive symptoms   Plan Of Care/Follow-up recommendations:  Activity: As tolerated.  Diet: Regular Diet.  Other: Please take all medications only as directed and keep all scheduled follow up appointments.  Consults:  Please see the patient's electronic medical record.  Significant Diagnostic Studies:  Please see the patient's electronic medical record.  Discharge Vitals:   Blood pressure 110/75, pulse 97, temperature 97.9 F (36.6 C), temperature source Oral, resp. rate 20, height 5\' 2"  (1.575 m), weight 61.009 kg (134 lb 8 oz), last menstrual period 02/04/2012.Marland Kitchen  Discharge destination:  Home.  Is patient on multiple antipsychotic therapies at discharge:  No  Has Patient had three or more failed trials of antipsychotic monotherapy by history: N/A Recommended Plan for Multiple Antipsychotic Therapies: N/A  Discharge Orders    Future Orders Please Complete By Expires   Diet - low sodium heart healthy      Increase activity slowly      Discharge instructions      Comments:   Take all of your medications as prescribed.  Be sure to keep ALL follow up appointments as scheduled. This is to ensure getting your refills on time to avoid any interruption in your  medication.  If you find that you can not keep your appointment, call the clinic and reschedule. Be sure to tell the nurse if you will need a refill before your appointment.     Medication List  As of 04/11/2012  3:54 PM   TAKE these medications      Indication    benztropine 1 MG tablet   Commonly known as: COGENTIN   Take 1 tablet (1 mg total) by mouth 2 (two) times daily in the am and at bedtime.. For side effects.    Indication: Extrapyramidal Reaction caused by Medications      lithium carbonate 300 MG capsule  Take one capsule with Breakfast (300mg ), and take two capsules at bedtime. (600)mg for mood stabilization.    Indication: Manic-Depression      ziprasidone 60 MG capsule   Commonly known as: GEODON   Take 2 capsules (120 mg total) by mouth at bedtime. For psychosis.    Indication: Schizophrenia           Follow-up Information    Follow up with Dr. Ladona Ridgel on 04/06/2012. (3:15pm appointment)    Contact information:   [facility] [address] [place] Telephone:  [phone]      Follow up with [facility]. (Classes are held Monday-Friday 10:00-11:30AM )    Contact information:   [facility] [address] [place] Telephone:  [phone]        Follow-up recommendations:   Activities: Resume typical activities Diet: Resume typical diet Other: Follow up with outpatient provider and report any side effects to out patient prescriber.  Comments:  Take all your medications as prescribed by your mental healthcare provider. Report any adverse effects and or reactions from your medicines to your outpatient provider promptly. Patient is instructed and cautioned to not engage in alcohol and or illegal drug use while on prescription medicines. In the event of worsening symptoms, patient is instructed to call the crisis hotline, 911 and or go to the nearest ED for appropriate evaluation and treatment  of symptoms.  Signed: Franchot Gallo 04/11/2012 3:54 PM

## 2012-05-20 ENCOUNTER — Emergency Department (HOSPITAL_COMMUNITY)
Admission: EM | Admit: 2012-05-20 | Discharge: 2012-05-21 | Disposition: A | Discharge status: Other Acute Inpatient Hospital | Payer: BC Managed Care – PPO | Attending: Emergency Medicine | Admitting: Emergency Medicine

## 2012-05-20 ENCOUNTER — Encounter (HOSPITAL_COMMUNITY): Payer: Self-pay

## 2012-05-20 DIAGNOSIS — F319 Bipolar disorder, unspecified: Secondary | ICD-10-CM | POA: Insufficient documentation

## 2012-05-20 DIAGNOSIS — F259 Schizoaffective disorder, unspecified: Secondary | ICD-10-CM

## 2012-05-20 DIAGNOSIS — Z046 Encounter for general psychiatric examination, requested by authority: Secondary | ICD-10-CM | POA: Insufficient documentation

## 2012-05-20 LAB — COMPREHENSIVE METABOLIC PANEL
ALT: 12 U/L (ref 0–35)
AST: 17 U/L (ref 0–37)
Calcium: 9.3 mg/dL (ref 8.4–10.5)
Creatinine, Ser: 0.75 mg/dL (ref 0.50–1.10)
GFR calc Af Amer: >90 mL/min (ref >90)
GFR calc non Af Amer: >90 mL/min (ref >90)
Sodium: 136 mEq/L (ref 135–145)
Total Protein: 7.5 g/dL (ref 6.0–8.3)

## 2012-05-20 LAB — CBC WITH DIFFERENTIAL/PLATELET
Basophils Absolute: 0.1 10*3/uL (ref 0.0–0.1)
Eosinophils Absolute: 0.3 10*3/uL (ref 0.0–0.7)
Eosinophils Relative: 6 % — ABNORMAL HIGH (ref 0–5)
HCT: 39.1 % (ref 36.0–46.0)
MCH: 26.8 pg (ref 26.0–34.0)
MCHC: 34 g/dL (ref 30.0–36.0)
MCV: 78.8 fL (ref 78.0–100.0)
Monocytes Absolute: 0.5 10*3/uL (ref 0.1–1.0)
Platelets: 269 10*3/uL (ref 150–400)
RDW: 14 % (ref 11.5–15.5)

## 2012-05-20 LAB — RAPID URINE DRUG SCREEN, HOSP PERFORMED
Amphetamines: NOT DETECTED
Barbiturates: NOT DETECTED
Benzodiazepines: NOT DETECTED
Cocaine: NOT DETECTED

## 2012-05-20 LAB — PREGNANCY, URINE: Preg Test, Ur: NEGATIVE

## 2012-05-20 LAB — SALICYLATE LEVEL: Salicylate Lvl: <2 mg/dL — ABNORMAL LOW (ref 2.8–20.0)

## 2012-05-20 LAB — LITHIUM LEVEL: Lithium Lvl: <0.25 mEq/L — ABNORMAL LOW (ref 0.80–1.40)

## 2012-05-20 MED ORDER — ONDANSETRON HCL 4 MG PO TABS: 4.0000 mg | ORAL_TABLET | Freq: Three times a day (TID) | ORAL | Status: DC | PRN

## 2012-05-20 MED ORDER — LORAZEPAM 2 MG/ML IJ SOLN
2.0000 mg | INTRAMUSCULAR | Status: AC
  Administered 2012-05-21: 2 mg via INTRAMUSCULAR
  Filled 2012-05-20: qty 1

## 2012-05-20 MED ORDER — BENZTROPINE MESYLATE 1 MG PO TABS
1.0000 mg | ORAL_TABLET | Freq: Two times a day (BID) | ORAL | Status: DC
  Administered 2012-05-21: 1 mg via ORAL
  Filled 2012-05-20 (×2): qty 1

## 2012-05-20 MED ORDER — LITHIUM CARBONATE 300 MG PO CAPS
300.0000 mg | ORAL_CAPSULE | Freq: Three times a day (TID) | ORAL | Status: DC
  Administered 2012-05-20 – 2012-05-21 (×3): 300 mg via ORAL
  Filled 2012-05-20 (×3): qty 1

## 2012-05-20 MED ORDER — LITHIUM CARBONATE 300 MG PO CAPS: 300.0000 mg | ORAL_CAPSULE | Freq: Three times a day (TID) | ORAL | Status: DC

## 2012-05-20 MED ORDER — ALUM & MAG HYDROXIDE-SIMETH 200-200-20 MG/5ML PO SUSP: 30.0000 mL | ORAL | Status: DC | PRN

## 2012-05-20 MED ORDER — ZIPRASIDONE HCL 20 MG PO CAPS: 60.0000 mg | ORAL_CAPSULE | Freq: Two times a day (BID) | ORAL | Status: DC

## 2012-05-20 MED ORDER — LORAZEPAM 1 MG PO TABS
1.0000 mg | ORAL_TABLET | Freq: Three times a day (TID) | ORAL | Status: DC | PRN
  Filled 2012-05-20: qty 1

## 2012-05-20 MED ORDER — ACETAMINOPHEN 325 MG PO TABS: 650.0000 mg | ORAL_TABLET | ORAL | Status: DC | PRN

## 2012-05-20 NOTE — ED Notes (Signed)
Pt started screaming in bathroom, but then stopped.  Went to give water to pt.  Pt was in hallway, waited for a minute for pt to finish wiping hands, and then told pt I would put water in room.  Pt did not respond.  After putting water in room, pt became upset that I put water in room.  Psychiatrist speaking with pt.

## 2012-05-20 NOTE — BH Assessment (Signed)
Assessment Note   [name] is a 26 y.o.African American female. Pt was brought to [facility] by [facility] police due to an IVC taken out by pt's mother. IVC states that pt was hearing voices, talking to herself, laughing at voices, the voices may tell her to harm herself or others due to lashing out at family members. Pt appears paranoid, is unsure of simple questions asked of her and can not function at her home. Pt denies SI/HI and SA; pt reports she feels depressed due to others not caring about her, feels no one is there for her, is more angry than usual and feels worthless. When asked about A/V hallucinations pt said, "Yeah," really slowly and then proceeded on a tangent saying she wants to know who she is and where she is going. Pt did not respond to follow-up questions. Pt appeared to involuntarily shrug during the entire assessment, spoke tangentially, and had disorganized speech. Pt reports that last night her and mom got into a verbal fight and she thinks mom does not understand who pt is and tries to provoke pt to become angry. Pt spoke for 3-4 minutes starting each sentence with, "The fact that," and made statements about who she is and who her family members are. 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 Pt reports she was happiest in middle school because she was a good Consulting civil engineer. Pt's tangents seemed to have the theme that everyone is in a better position than she is and she feels judged and does not have friends. Pt confirmed inpatient hospitalization in August but paused and then started on tangent about how when she is at home trying to use the bathroom her mom goes and calls the police and makes her feel judged. Pt has records from [facility] of previous hospitalizations in July 2013. Pt has previous dx of schizoaffective dx and bipolar dx-Dr. J has detailed note of her medication. Pt recommended by Dr. Shela Commons for inpatient tx.    Axis I:  296.80 Bipolar NOS Axis II: 799.9 Deferred Axis III:  Past Medical History  Diagnosis Date  . Bipolar 1 disorder    Axis IV: occupational problems and problems with primary support group Axis V: 20  Past Medical History:  Past Medical History  Diagnosis Date  . Bipolar 1 disorder     History reviewed. No pertinent past surgical history.  Family History: History reviewed. No pertinent family history.  Social History:  reports that she has never smoked. She does not have any smokeless tobacco history on file. She reports that she drinks alcohol. She reports that she does not use illicit drugs.  Additional Social History:     CIWA: CIWA-Ar BP: 115/78 mmHg Pulse Rate: 68  COWS:    Allergies:  Allergies  Allergen Reactions  . Quetiapine Other (See Comments)    Patient did not say specific reaction just has a "reaction"     Home Medications:  (Not in a hospital admission)  OB/GYN Status:  No LMP recorded.  General Assessment Data Location of Assessment: [facility] ED Living Arrangements: Parent (Mother) Can pt return to current living arrangement?: Yes (If pt is stable) Admission Status: Involuntary Is patient capable of signing voluntary admission?: No Transfer from: Home Referral Source: Self/Family/Friend  Education Status Is patient currently in school?: No Current Grade:  (Completed HS)  Risk to self Suicidal Ideation: No-Not Currently/Within Last 6 Months Suicidal Intent: No-Not Currently/Within Last  6 Months Is patient at risk for suicide?: No Suicidal Plan?: No-Not Currently/Within Last 6 Months Access to Means: No What has been your use of drugs/alcohol within the last 12 months?: Pt Denies Previous Attempts/Gestures: No How many times?: 0  Other Self Harm Risks: None known Intentional Self Injurious Behavior: None Family Suicide History: No Recent stressful life event(s): Conflict (Comment) (Arguement with mother) Persecutory voices/beliefs?:  No Depression: Yes Depression Symptoms: Despondent;Tearfulness;Feeling worthless/self pity;Feeling angry/irritable Substance abuse history and/or treatment for substance abuse?: No Suicide prevention information given to non-admitted patients: Not applicable  Risk to Others Homicidal Ideation: No-Not Currently/Within Last 6 Months Thoughts of Harm to Others: No-Not Currently Present/Within Last 6 Months Current Homicidal Intent: No-Not Currently/Within Last 6 Months Current Homicidal Plan: No-Not Currently/Within Last 6 Months Access to Homicidal Means: No Identified Victim: N/A History of harm to others?: No Assessment of Violence: On admission Violent Behavior Description: N/A Does 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 to weapons?: No Criminal Charges Pending?: No Does patient have a court date: No  Psychosis Hallucinations: Auditory (Pt appears to be responding to internal stimuli) Delusions: Jealous (Pt compares herself to her siblings and thinks theyarebetter)  Mental Status Report Appear/Hygiene: Other (Comment) (Pt appears dressed in hospital scrubs) Eye Contact: Fair Motor Activity: Freedom of movement;Tics (Pt appeared to move or shrug her shoulders during random tim) Speech: Incoherent;Tangential;Word salad Level of Consciousness: Alert Mood: Depressed;Suspicious;Apprehensive;Preoccupied Affect: Depressed Anxiety Level: Minimal Thought Processes: Irrelevant;Tangential;Flight of Ideas Judgement: Impaired Orientation: Person;Place;Time Obsessive Compulsive Thoughts/Behaviors: None  Cognitive Functioning Concentration: Decreased Memory: Recent Intact;Remote Intact IQ: Average Insight: Poor Impulse Control: Poor Appetite: Fair (Pt stated her appetite was "not welcoming") Weight Loss: 0  Weight Gain: 0  Sleep: No Change Total Hours of Sleep: 9  Vegetative Symptoms: None  ADLScreening [facility] Assessment Services) Patient's cognitive ability adequate to safely complete daily  activities?: Yes Patient able to express need for assistance with ADLs?: Yes Independently performs ADLs?: Yes (appropriate for developmental age)  Abuse/Neglect [facility]) Physical Abuse: Denies Verbal Abuse: Denies Sexual Abuse: Denies  Prior Inpatient Therapy Prior Inpatient Therapy: Yes Prior Therapy Dates: 2013 Prior Therapy Facilty/Provider([facility]): [facility] Reason for Treatment: Schizoaffective and bipolar  Prior Outpatient Therapy Prior Outpatient Therapy: No (Pt denies) Prior Therapy Dates: unknown Prior Therapy Facilty/Provider(s): unknown Reason for Treatment: unknown  ADL Screening (condition at time of admission) Patient's cognitive ability adequate to safely complete daily activities?: Yes Patient able to express need for assistance with ADLs?: Yes Independently performs ADLs?: Yes (appropriate for developmental age)       Abuse/Neglect Assessment (Assessment to be complete while patient is alone) Physical Abuse: Denies Verbal Abuse: Denies Sexual Abuse: Denies Values / Beliefs Cultural Requests During Hospitalization: None Spiritual Requests During Hospitalization: None        Additional Information 1:1 In Past 12 Months?: No CIRT Risk: No Elopement Risk: No Does patient have medical clearance?: Yes     Disposition: Pt seen by Dr. Elsie Saas and he recommends inpatient tx. Will send info to [facility].  Disposition Disposition of Patient: Referred to;Inpatient treatment program  On Site Evaluation by:   Reviewed with Physician:     Raynald Blend 05/20/2012 6:55 PM

## 2012-05-20 NOTE — ED Notes (Signed)
Pt sitting in a chair. Pt is suspicious of all activity around her.  Pt states she is currently very afraid of what is going on. [employer] and Sitter at bedside. Mom at bedside.

## 2012-05-20 NOTE — ED Notes (Signed)
Per pt, pt from [facility].  Was IVC's by Mom.  Pt recently hearing voices and lashing out at family.  Recent dx bipolar.  Pt is denying SI, voices and hallucinations at this time.  Pt tearful at times.  Pt a/o x 3

## 2012-05-20 NOTE — ED Notes (Addendum)
Pt. has 1 bag of belongings located behind nurses station across from room.

## 2012-05-20 NOTE — ED Notes (Signed)
Pt. and belongings both wanded by security 

## 2012-05-20 NOTE — ED Notes (Signed)
Pt refused her hs cogentin, difficulty processing her thoughts, delay explained to her multiple times, pt continues to ask why she can't go home and if she has gotten any phone calls. Pt's speech at times difficult to understand, pt observed responding to internal constantly. Labile mood, crying, and agitation, refusing to go back to room when redirected, security called and once seen by pt, she went back to room.

## 2012-05-20 NOTE — ED Notes (Signed)
Mother called, she has patient's pocketbook.

## 2012-05-20 NOTE — ED Notes (Signed)
Patient up to desk asking about her pocketbook. I stated I would go look in locker 35 for her pocketbook. I went to locker 35 and did not see a pocketbook. Went to reporting RN(Maggie) and asked her if patient had pocketbook, she said she was not sure. I checked the bags under the desk that were unlabeled, neither contained a pocketbook. I checked cabinet in room 12, there was no pocketbook. I informed RN Fleet Contras and rechecked the chart, mother may have it. Will inform [place], Agricultural consultant, Charity fundraiser.Marland Kitchen

## 2012-05-20 NOTE — ED Provider Notes (Signed)
History     CSN: [id number]  Arrival date & time 05/20/12  0229   First MD Initiated Contact with Patient 05/20/12 0510      Chief Complaint  Patient presents with  . Medical Clearance    (Consider location/radiation/quality/duration/timing/severity/associated sxs/prior treatment) HPI History provided by [employer].  Level 5 caveat applies because of psychiatric illness.  Patient's mother filled out IVC paperwork because she believes that the patient is a danger to herself and others. She was recently diagnosed w/ bipolar disorder and is on risperdal.  She has been hearing voices and talking to herself and laughing.  Her mother believes that the voices are telling her to harm others.  She has been lashing out at her family members.  She has also been very paranoid that people have been "doing things that they haven't".  Pt denies SI/HI and hallucinations at this time.  She states that she believes she's well but other think that she's not well and people who she trusted are trying to take things from her.   Past Medical History  Diagnosis Date  . Bipolar 1 disorder     History reviewed. No pertinent past surgical history.  History reviewed. No pertinent family history.  History  Substance Use Topics  . Smoking status: Never Smoker   . Smokeless tobacco: Not on file  . Alcohol Use: Yes     rarely    OB History    Grav Para Term Preterm Abortions TAB SAB Ect Mult Living                  Review of Systems  All other systems reviewed and are negative.    Allergies  Quetiapine  Home Medications  No current outpatient prescriptions on file.  BP 118/71  Pulse 102  Temp 98.8 F (37.1 C) (Oral)  Resp 20  SpO2 100%  Physical Exam  Nursing note and vitals reviewed. Constitutional: She is oriented to person, place, and time. She appears well-developed and well-nourished. No distress.  HENT:  Head: Normocephalic and atraumatic.  Eyes:       Normal appearance  Neck: Normal  range of motion.  Cardiovascular: Normal rate and regular rhythm.   Pulmonary/Chest: Effort normal and breath sounds normal. No respiratory distress.  Musculoskeletal: Normal range of motion.  Neurological: She is alert and oriented to person, place, and time.  Skin: Skin is warm and dry. No rash noted.  Psychiatric: She has a normal mood and affect. Her behavior is normal.       I can not follow patient's thought process.  She does not answer my questions appropriately.      ED Course  Procedures (including critical care time)  Labs Reviewed  CBC WITH DIFFERENTIAL - Abnormal; Notable for the following:    Eosinophils Relative 6 (*)     All other components within normal limits  SALICYLATE LEVEL - Abnormal; Notable for the following:    Salicylate Lvl <2.0 (*)     All other components within normal limits  LITHIUM LEVEL - Abnormal; Notable for the following:    Lithium Lvl <0.25 (*)     All other components within normal limits  COMPREHENSIVE METABOLIC PANEL  URINE RAPID DRUG SCREEN (HOSP PERFORMED)  ETHANOL  ACETAMINOPHEN LEVEL  PREGNANCY, URINE   No results found.   1. Bipolar disorder       MDM  26yo F w/ bipolar disorder sent for medical clearance.  Has IVC paperwork.   Her mother  believes she is having auditory hallucinations and that she's a danger to herself and others.  Patient seems to be an unreliable historian, but she denies these accusations.  She has been cleared medically.  ACT team consulted.  Holding orders written.          Otilio Miu, [place] 05/20/12 [phone]

## 2012-05-20 NOTE — ED Notes (Signed)
RUE:[id number]> Expected date:<BR> Expected time:<BR> Means of arrival:<BR> Comments:<BR> From [facility]-with [employer]-female- SI

## 2012-05-20 NOTE — ED Notes (Signed)
PA at bedside.

## 2012-05-20 NOTE — ED Notes (Signed)
Pt asked to see the RN.  Pt was unable to verbalize any needs. Pt stated, "I don't know." "Why didn't anybody tell me what room I'm in."  Pt reassured. Pt noted to be mouthing words but nothing was comprehensible.

## 2012-05-20 NOTE — ED Notes (Addendum)
Pt. In blue scrubs and red socks. 

## 2012-05-20 NOTE — ED Provider Notes (Signed)
Medical screening examination/treatment/procedure(s) were performed by non-physician practitioner and as supervising physician I was immediately available for consultation/collaboration.   Jarrod Bodkins L Ingris Pasquarella, MD 05/20/12 2303 
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
## 2012-05-21 ENCOUNTER — Inpatient Hospital Stay (HOSPITAL_COMMUNITY)
Admission: AD | Admit: 2012-05-21 | Discharge: 2012-05-28 | DRG: 430 | Disposition: A | Discharge status: Home / Self Care | Payer: BC Managed Care – PPO | Source: Ambulatory Visit | Attending: Psychiatry | Admitting: Psychiatry

## 2012-05-21 DIAGNOSIS — F259 Schizoaffective disorder, unspecified: Principal | ICD-10-CM | POA: Diagnosis present

## 2012-05-21 DIAGNOSIS — F250 Schizoaffective disorder, bipolar type: Secondary | ICD-10-CM

## 2012-05-21 DIAGNOSIS — Z888 Allergy status to other drugs, medicaments and biological substances status: Secondary | ICD-10-CM

## 2012-05-21 MED ORDER — TRAZODONE HCL 50 MG PO TABS
50.0000 mg | ORAL_TABLET | Freq: Every evening | ORAL | Status: DC | PRN
  Administered 2012-05-23: 50 mg via ORAL
  Filled 2012-05-21: qty 1
  Filled 2012-05-21: qty 28
  Filled 2012-05-21: qty 1

## 2012-05-21 MED ORDER — MAGNESIUM HYDROXIDE 400 MG/5ML PO SUSP
30.0000 mL | Freq: Every day | ORAL | Status: DC | PRN
  Administered 2012-05-22: 30 mL via ORAL

## 2012-05-21 MED ORDER — ALUM & MAG HYDROXIDE-SIMETH 200-200-20 MG/5ML PO SUSP: 30.0000 mL | ORAL | Status: DC | PRN

## 2012-05-21 MED ORDER — ACETAMINOPHEN 325 MG PO TABS
650.0000 mg | ORAL_TABLET | Freq: Four times a day (QID) | ORAL | Status: DC | PRN
Start: 2012-05-21 — End: 2012-05-28

## 2012-05-21 NOTE — Progress Notes (Signed)
Patient ID: [name], female   DOB: 1985-10-21, 26 y.o.   MRN: [id number] Pt admitted on involuntary basis, petitioned by her mother, pt presents on admission as very disorganized, thoughts difficult to follow, pt is paranoid and suspicious and appears to be responding to internal stimuli, pt did talk about wanting to take her medications only at bedtime instead of throughout the day, pt also able to say that she has been seeing Dr. Ladona Ridgel at [facility] and was suppose to see him recently but could not remember if she saw the doctor or not. Pt also spoke about being in her room and the police came and that she was scared and there were handcuffs and she got a needle in her arm and she was bleeding all over the place, pt did state that she lives with her mother and that she thinks that she will be able to go back to live with her at discharge, pt denies any depression or suicidal thoughts and is able to contract for safety on the unit, pt was oriented to unit and safety maintained

## 2012-05-21 NOTE — ED Notes (Signed)
The patient has a visitor

## 2012-05-21 NOTE — ED Notes (Signed)
1 bag of belongings sent w/ pt 

## 2012-05-21 NOTE — ED Notes (Signed)
Staff in room to administer Ativan 2 mg IM d/t pt extremely anxious, rambling incoherently, responding to internal stimuli,  Talking to herself constantly, agitated, pacing, crying uncontrollably at times, and will yell out at times. When asked to go back to room d/t other pt's sleeping, pt refuses to go, pt refusing to allow administration of IM injection, starts to yell and resist.

## 2012-05-21 NOTE — ED Provider Notes (Signed)
8:02 AM Filed Vitals:   05/21/12 0512  BP: 96/58  Pulse: 75  Temp:   Resp: 18   Awaiting [facility] placement today. Dr Shela Commons saw yesterday and recommends inpatient treatment secondary to poor insight and poor medication compliance  Lyanne Co, MD 05/21/12 [phone]

## 2012-05-21 NOTE — ED Notes (Signed)
5:00p dose to be given after supper--not given prior to transport

## 2012-05-21 NOTE — ED Notes (Signed)
PD here to transport 

## 2012-05-21 NOTE — ED Notes (Signed)
Pt ambulatory to TO [facility] w/ [employer].

## 2012-05-21 NOTE — [facility] Counselor (Signed)
TC from [facility] @ [facility] to confirm if pt has been taking meds today. Confirmed with pt's RN that she has been questioning a lot, but taking meds. Relayed this info to [facility].

## 2012-05-21 NOTE — [facility] Counselor (Signed)
Accepted to [facility] by Dr. Allena Katz to Dr. Allena Katz ([id number]). Completed support documentation. Completed 1st examination. Faxed info to [facility]. Updated RN & EDP. Pt is IVC and to be transported via [employer].
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
## 2012-05-21 NOTE — ED Notes (Signed)
Pt's mom into see 

## 2012-05-21 NOTE — ED Notes (Signed)
I gave the patient a toothbrush, tooth paste, blue scrubs, 2 towels, 3 wash cloths, soap, and deodorant

## 2012-05-21 NOTE — ED Notes (Signed)
[employer] called for transport to [facility]-pt aware

## 2012-05-22 DIAGNOSIS — F259 Schizoaffective disorder, unspecified: Principal | ICD-10-CM
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
## 2012-05-22 NOTE — Progress Notes (Signed)
D: Writer approached pt in hallway who was sitting on the floor teary-eyed. Pt said that she was hungry and wanted to comb her hair, but she did not receive a care package.  A: Writer retrieved a care package for this package and added a comb to the bag. Writer also gave pt a light snack with some Gatorade. Shortly afterwards pt recalled that she may have left her bag in the dayroom. Her bag was retrieved. Nurse allowed pt to keep both bags with the new comb. This was in the patient's best interest. Writer walked with pt back into her room. Continued support and availability as needed has been extended to this pt. Q64min checks remains for this pt.  R: Pt went back to bed and required no further interventions from this Clinical research associate. At this exact time, this pt is now resting in bed with her eyes closed.

## 2012-05-22 NOTE — Progress Notes (Signed)
[facility] MD Progress Note  05/22/2012 2:19 PM  Diagnosis:  Axis I: Schizoaffective Disorder  ADL's:  Impaired  Sleep: Fair  Appetite:  Fair  Suicidal Ideation:  Plan:  NO Intent:  NO Means:  NO Homicidal Ideation:  Plan:  No Intent:  NO Means:  NO  AEB (as evidenced by):  Mental Status Examination/Evaluation: Objective:  Appearance: Guarded  Eye Contact::  None  Speech:  Blocked, Garbled and Slow  Volume:  Decreased  Mood:  Anxious and Depressed  Affect:  Blunt, Non-Congruent, Constricted, Flat, Restricted and Tearful  Thought Process:  Disorganized, Irrelevant, Loose and Tangential  Orientation:  Full  Thought Content:  Delusions, Ideas of Reference:   Paranoia Delusions, Paranoid Ideation and Rumination  Suicidal Thoughts:  No  Homicidal Thoughts:  No  Memory:  Immediate;   Fair Recent;   Fair  Judgement:  Impaired  Insight:  Lacking  Psychomotor Activity:  Psychomotor Retardation and Restlessness  Concentration:  Poor  Recall:  Fair  Akathisia:  No  Handed:  Right  AIMS (if indicated):     Assets:  Desire for Improvement Housing Intimacy Physical Health Social Support Transportation Vocational/Educational  Sleep:      Vital Signs:Blood pressure 106/76, pulse 102, temperature 98.9 F (37.2 C), temperature source Oral, resp. rate 18, height 5' 0.5" (1.537 m), weight 136 lb (61.689 kg). Current Medications: Current Facility-Administered Medications  Medication Dose Route Frequency Provider Last Rate Last Dose  . acetaminophen (TYLENOL) tablet 650 mg  650 mg Oral Q6H PRN Caroleen Hamman, NP      . alum & mag hydroxide-simeth (MAALOX/MYLANTA) 200-200-20 MG/5ML suspension 30 mL  30 mL Oral Q4H PRN Caroleen Hamman, NP      . magnesium hydroxide (MILK OF MAGNESIA) suspension 30 mL  30 mL Oral Daily PRN Caroleen Hamman, NP   30 mL at 05/22/12 0812  . traZODone (DESYREL) tablet 50 mg  50 mg Oral QHS PRN Caroleen Hamman, NP       Facility-Administered Medications Ordered in  Other Encounters  Medication Dose Route Frequency Provider Last Rate Last Dose  . DISCONTD: acetaminophen (TYLENOL) tablet 650 mg  650 mg Oral Q4H PRN Otilio Miu, PA      . DISCONTD: alum & mag hydroxide-simeth (MAALOX/MYLANTA) 200-200-20 MG/5ML suspension 30 mL  30 mL Oral PRN Otilio Miu, PA      . DISCONTD: benztropine (COGENTIN) tablet 1 mg  1 mg Oral BID Nehemiah Settle, MD   1 mg at 05/21/12 1042  . DISCONTD: lithium carbonate capsule 300 mg  300 mg Oral TID WC Provider Default, MD   300 mg at 05/21/12 1420  . DISCONTD: LORazepam (ATIVAN) tablet 1 mg  1 mg Oral Q8H PRN Otilio Miu, PA      . DISCONTD: ondansetron (ZOFRAN) tablet 4 mg  4 mg Oral Q8H PRN Otilio Miu, PA      . DISCONTD: ziprasidone (GEODON) capsule 60 mg  60 mg Oral BID WC Nehemiah Settle, MD        Lab Results: No results found for this or any previous visit (from the past 48 hour(s)).  Physical Findings: AIMS: Facial and Oral Movements Muscles of Facial Expression: None, normal Lips and Perioral Area: None, normal Jaw: None, normal Tongue: None, normal,Extremity Movements Upper (arms, wrists, hands, fingers): None, normal Lower (legs, knees, ankles, toes): None, normal, Trunk Movements Neck, shoulders, hips: None, normal, Overall Severity Severity of abnormal movements (highest score from questions above): None, normal  Incapacitation due to abnormal movements: None, normal Patient's awareness of abnormal movements (rate only patient's report): No Awareness, Dental Status Current problems with teeth and/or dentures?: No Does patient usually wear dentures?: No  CIWA:    COWS:     Treatment Plan Summary: Daily contact with patient to assess and evaluate symptoms and progress in treatment Medication management  Plan: Continue current treatment plan and patient has been compliant and adjusting to her medications. She has no  EPS.  Danay Mckellar,JANARDHAHA R. 05/22/2012, 2:19 PM

## 2012-05-22 NOTE — Progress Notes (Signed)
[facility] Family/Significant Other Suicide Prevention Education  Suicide Prevention Education:  Education Completed; Domita Edds (mother) [phone] has been identified by the patient as the family member/significant other with whom the patient will be residing, and identified as the person(s) who will aid the patient in the event of a mental health crisis (suicidal ideations/suicide attempt).  With written consent from the patient, the family member/significant other has been provided the following suicide prevention education, prior to the and/or following the discharge of the patient.  The suicide prevention education provided includes the following:  Suicide risk factors  Suicide prevention and interventions  National Suicide Hotline telephone number  [facility] assessment telephone number  [facility] Emergency Assistance 911  [facility] and/or Residential Mobile Crisis Unit telephone number  Request made of family/significant other to:  Remove weapons (e.g., guns, rifles, knives), all items previously/currently identified as safety concern.    Remove drugs/medications (over-the-counter, prescriptions, illicit drugs), all items previously/currently identified as a safety concern.  The family member/significant other verbalizes understanding of the suicide prevention education information provided.  The family member/significant other agrees to remove the items of safety concern listed above.  Mother stated that the pt's thoughts of being "set up" at home are misunderstandings and that the pt was "confused" and had not been taking her medication for 2 weeks. Mother reported the pt was "out of it". She reports that the pt has gone off her medications before and this is a normal reaction. Mother reports the pt "does not like to take the shot". Mother reported that the pt see's Dr. Ladona Ridgel for medication and that she needs a therapist to talk to but has  been resistant to going. Pt wasperscribed 6 mg of Resporal each night but mother reports that she "never did take them". Mother reports seeing the pt "talk herself" and feels the pt may be hearing voices. Mother would like the pt to feel she needs to take her medications and hope that talking to a counselor will do this however mother reported that the pt feels she act's "normal" on and off her mediations and does not see a reason to be in the hospital. Mother reports that the pt may need to go back to taking the monthly shot. Mother reports that there are no guns in the home and that she has "hid" all the knives.  05/22/2012, 3:45 PM Gevena Mart

## 2012-05-22 NOTE — Progress Notes (Signed)
Patient ID: [name], female   DOB: 1986/05/21, 26 y.o.   MRN: [id number]   [facility] Group Notes:  (Counselor/Nursing/MHT/Case Management/Adjunct)  05/22/2012 11 AM  Type of Therapy:  Aftercare Planning, Group Therapy, Dance/Movement Therapy   Participation Level:  Did Not Attend  Therapist checked-in with pt individually. Pt seemed confused and shared how she was uncomfortable because she wanted her hair done. Pt stated that she wanted to go to a wellness center.   Cassidi Long 05/22/2012. 11:08 AM

## 2012-05-22 NOTE — Progress Notes (Signed)
Spoke with pt at length 1:1. Pt is disorganized, tangential, agitated and displays mild confusion. Pt doesn't feel she needs to be here and at first denies being non compliant with meds. However after processing some with this writer pt states, "I did miss some but I didn't think that was a big deal." Pt also displays thought blocking and clearly has internal stimuli even though she denies. Pt observed mumbling to herself. Pt given emotional support and reassurance as pt is apprehensive about being here. She denies SI/HI/AVH and remains safe. Lawrence Marseilles

## 2012-05-22 NOTE — Progress Notes (Signed)
Adult Psychosocial Assessment Update Interdisciplinary Team  [facility] admissions[facility]discharges:  Admissions Discharges  Date: 03/10/2012 Date: 03/23/2012  Date: 05/21/2012  Date:  Date: Date:  Date: Date:  Date: Date:   Changes since the last Psychosocial Assessment (including adherence to outpatient mental health and/or substance abuse treatment, situational issues contributing to decompensation and/or relapse). Pt reported that she has been living with her mother and searching for a job since her last [facility] discharge. Pt reported that she has been seeing a doctor but was unable to remember who or where.              Discharge Plan 1. Will you be returning to the same living situation after discharge?   Yes: X No:      If no, what is your plan?           2. Would you like a referral for services when you are discharged? Yes:  X   If yes, for what services?  No:       Pt reported that she would like to go to a wellness center.        Summary and Recommendations (to be completed by the evaluator) Pt is a 26 year old female. Pt seemed very confused and was unable to answer therapist assessment questions including what town she currently lives in and which doctor she has been seeing. Pt is having tension with her mother, stating that her mother has been, "setting her up." Implying that pt feels like she does not need treatment. Recommendations for treatment include crisis stabilization, case management, medication management, psycho-education to teach coping skills, and group therapy.                        Signature:  Cassidi Long, 05/22/2012 11:21 AM

## 2012-05-22 NOTE — Progress Notes (Signed)
Pt discussed with Clinical research associate that she was upset that a NP had asked for her H&P after she already spoke with her physician. Pt was explained that each provider has to obtain their own assessment. She was also angry that her meds may have been changed by this NP. Pt was informed that her meds were not changed. Overall Pt  Still does not understand why she was reassessed by this NP. Well inform oncoming shift that she may bring up this topic again.

## 2012-05-22 NOTE — Progress Notes (Signed)
Psychoeducational Group Note  Date:  05/22/2012 Time:  0945 am  Group Topic/Focus:  Identifying Needs:   The focus of this group is to help patients identify their personal needs that have been historically problematic and identify healthy behaviors to address their needs.  Participation Level:  Did Not Attend    Andrena Mews 05/22/2012,10:23 AM

## 2012-05-23 MED ORDER — ZIPRASIDONE HCL 60 MG PO CAPS
60.0000 mg | ORAL_CAPSULE | Freq: Two times a day (BID) | ORAL | Status: DC
  Administered 2012-05-23 – 2012-05-24 (×2): 60 mg via ORAL
  Filled 2012-05-23 (×6): qty 1

## 2012-05-23 MED ORDER — LITHIUM CARBONATE ER 450 MG PO TBCR
450.0000 mg | EXTENDED_RELEASE_TABLET | Freq: Two times a day (BID) | ORAL | Status: DC
  Administered 2012-05-23 – 2012-05-24 (×3): 450 mg via ORAL
  Filled 2012-05-23 (×8): qty 1

## 2012-05-23 MED ORDER — BENZTROPINE MESYLATE 0.5 MG PO TABS
0.5000 mg | ORAL_TABLET | Freq: Two times a day (BID) | ORAL | Status: DC
  Administered 2012-05-23 – 2012-05-24 (×2): 0.5 mg via ORAL
  Filled 2012-05-23 (×6): qty 1

## 2012-05-23 NOTE — Progress Notes (Signed)
Pt sitting in hallway almost all night. Only slept 1.75 hours. When asked why patient would not stay in her room she replied, "I think the guy across the hall is fishy." Encouraged pt to return to room with door closed. Reassured her staff is on the hall and also offered trazadone prn for sleep. Pt unwilling to comply and appeared paranoid and distrusting of the medication. Lawrence Marseilles

## 2012-05-23 NOTE — Progress Notes (Signed)
Currently resting quietly in bed with eyes closed. Respirations are even and unlabored. No acute distress noted. Safety has been maintained with Q15 minute observation. Will continue Q15 minute observation and continue current POC. 

## 2012-05-23 NOTE — Progress Notes (Signed)
Spoke with pt 1:1 with mom present. Pt remains disorganized and tangential. Mom states she is having a hard time understanding pt. Pt reluctant to take scheduled lithium but with mom's encouragement did comply. Support given. Pt denies AH however mumbles to self. No VH/SI/HI and remains safe. Lawrence Marseilles ]

## 2012-05-23 NOTE — Progress Notes (Signed)
[facility] MD Progress Note  05/23/2012 11:55 AM  Diagnosis:  Axis I: Schizoaffective Disorder  ADL's:  Impaired  Sleep: Fair  Appetite:  Fair  Suicidal Ideation:  Denied suicidal ideations, intention or plans Homicidal Ideation:  Denied homicidal ideations, intention or plans  AEB (as evidenced by):Patient continues to be paranoid, guarded, suspicious and responding to internal stimuli. Staff has reported disorganized behaviors and psychotic with limited reality. She has poor insight, judgment and impulse control.  Mental Status Examination/Evaluation: Objective:  Appearance: Bizarre, Disheveled and Guarded  Eye Contact::  Minimal  Speech:  Blocked, Garbled, Slow and Slurred  Volume:  Decreased  Mood:  Anxious, Hopeless and Worthless  Affect:  Non-Congruent, Flat and Restricted  Thought Process:  Disorganized, Irrelevant, Loose and Tangential  Orientation:  Full  Thought Content:  Delusions, Ideas of Reference:   Paranoia Delusions and Paranoid Ideation  Suicidal Thoughts:  No  Homicidal Thoughts:  No  Memory:  Immediate;   Poor  Judgement:  Impaired  Insight:  Lacking  Psychomotor Activity:  Psychomotor Retardation and Restlessness  Concentration:  Fair  Recall:  Fair  Akathisia:  No  Handed:  Right  AIMS (if indicated):     Assets:  Manufacturing systems engineer Housing Leisure Time Physical Health Social Support Transportation  Sleep:  Number of Hours: 1.75    Vital Signs:Blood pressure 115/75, pulse 73, temperature 98.7 F (37.1 C), temperature source Oral, resp. rate 18, height 5' 0.5" (1.537 m), weight 136 lb (61.689 kg). Current Medications: Current Facility-Administered Medications  Medication Dose Route Frequency Provider Last Rate Last Dose  . acetaminophen (TYLENOL) tablet 650 mg  650 mg Oral Q6H PRN Caroleen Hamman, NP      . alum & mag hydroxide-simeth (MAALOX/MYLANTA) 200-200-20 MG/5ML suspension 30 mL  30 mL Oral Q4H PRN Caroleen Hamman, NP      . benztropine  (COGENTIN) tablet 0.5 mg  0.5 mg Oral BID Nehemiah Settle, MD      . lithium carbonate (ESKALITH) CR tablet 450 mg  450 mg Oral Q12H Nehemiah Settle, MD      . magnesium hydroxide (MILK OF MAGNESIA) suspension 30 mL  30 mL Oral Daily PRN Caroleen Hamman, NP   30 mL at 05/22/12 0812  . traZODone (DESYREL) tablet 50 mg  50 mg Oral QHS PRN Caroleen Hamman, NP      . ziprasidone (GEODON) capsule 60 mg  60 mg Oral BID WC Nehemiah Settle, MD        Lab Results: No results found for this or any previous visit (from the past 48 hour(s)).  Physical Findings: AIMS: Facial and Oral Movements Muscles of Facial Expression: None, normal Lips and Perioral Area: None, normal Jaw: None, normal Tongue: None, normal,Extremity Movements Upper (arms, wrists, hands, fingers): None, normal Lower (legs, knees, ankles, toes): None, normal, Trunk Movements Neck, shoulders, hips: None, normal, Overall Severity Severity of abnormal movements (highest score from questions above): None, normal Incapacitation due to abnormal movements: None, normal Patient's awareness of abnormal movements (rate only patient's report): No Awareness, Dental Status Current problems with teeth and/or dentures?: No Does patient usually wear dentures?: No  CIWA:    COWS:     Treatment Plan Summary: Daily contact with patient to assess and evaluate symptoms and progress in treatment Medication management  Plan: Restarted her medication Lithium 450 mg BID, Geodon 60 mg BID and cogentin 0.5 mg PO BID, will be adjusted as clinically required and she will be discharged when she can tolerate  out patient care and compliance with her medications. Patient mother is supportive of her therapy.   Aneka Fagerstrom,JANARDHAHA R. 05/23/2012, 11:55 AM

## 2012-05-23 NOTE — Progress Notes (Signed)
Psychoeducational Group Note  Date:  05/23/2012 Time:  0945 am  Group Topic/Focus:  Making Healthy Choices:   The focus of this group is to help patients identify negative/unhealthy choices they were using prior to admission and identify positive/healthier coping strategies to replace them upon discharge.  Participation Level:  None  Participation Quality:  Inattentive  Affect:  Blunted  Cognitive:  Hallucinating  Insight:  None  Engagement in Group:  None  Additional Comments:    Andrena Mews 05/23/2012, 10:29 AM

## 2012-05-23 NOTE — Progress Notes (Signed)
D   Pt has been bizarre and irritable  She did not want to take her medicine but after much encouragement she finally did take it   She has bizarre facial movements and body movements which do not look like eps   She has limited interaction with others  Her thoughts are disorganized and tangential   She is also thought blocking  A   Verbal support given   Medications administered and effectiveness monitored  Q 15 min checks R   Pt safe at present

## 2012-05-23 NOTE — Progress Notes (Signed)
Patient ID: [name], female   DOB: 05-23-1986, 26 y.o.   MRN: [id number]  [facility] Group Notes:  (Counselor/Nursing/MHT/Case Management/Adjunct)  05/23/2012 11 AM  Type of Therapy:  Aftercare Planning, Group Therapy, Dance/Movement Therapy   Participation Level:  Minimal  Participation Quality:  Drowsy and Inattentive  Affect:  Blunted and Flat  Cognitive:  Confused, Delusional and Hallucinating  Insight:  Limited  Engagement in Group:  Limited  Engagement in Therapy:  Limited  Modes of Intervention:  Clarification, Problem-solving, Role-play, Socialization and Support  Summary of Progress/Problems: Pt. attended and participated in aftercare planning group and counseling group on healthy support systems and how to support ourselves. Pt. accepted information on suicide prevention, warning signs to look for with suicide and crisis line numbers to use. The pt. agreed to call crisis line numbers if having warning signs or having thoughts of suicide.  Pt was talking to herself softly throughout group and often would look around and swat at her ear indicating that she may be experiencing both auditory and visual hallucinations.       Debarah Crape 05/23/2012. 11:33 AM

## 2012-05-24 MED ORDER — BENZTROPINE MESYLATE 1 MG PO TABS
1.0000 mg | ORAL_TABLET | ORAL | Status: DC
  Administered 2012-05-24 – 2012-05-25 (×3): 1 mg via ORAL
  Filled 2012-05-24 (×7): qty 1

## 2012-05-24 MED ORDER — ZIPRASIDONE HCL 60 MG PO CAPS
60.0000 mg | ORAL_CAPSULE | Freq: Every day | ORAL | Status: AC
  Administered 2012-05-24: 60 mg via ORAL
  Filled 2012-05-24: qty 1

## 2012-05-24 MED ORDER — ZIPRASIDONE HCL 60 MG PO CAPS
120.0000 mg | ORAL_CAPSULE | Freq: Every day | ORAL | Status: DC
  Administered 2012-05-25 – 2012-05-27 (×3): 120 mg via ORAL
  Filled 2012-05-24: qty 1
  Filled 2012-05-24: qty 28
  Filled 2012-05-24 (×4): qty 2

## 2012-05-24 MED ORDER — LITHIUM CARBONATE ER 450 MG PO TBCR
450.0000 mg | EXTENDED_RELEASE_TABLET | ORAL | Status: DC
  Administered 2012-05-24 – 2012-05-26 (×4): 450 mg via ORAL
  Filled 2012-05-24 (×6): qty 1

## 2012-05-24 NOTE — Progress Notes (Signed)
Patient ID: [name], female   DOB: [birth year], 26 y.o.   MRN: [id number] Patient has been cooperative today; compliant with her medication.  She seems reluctant to take her medication, but she takes it with some coaxing.  She denies any SI/HI/AVH, but she appears to be responding to internal stimuli.  She is guarded and somewhat suspicious.  She attended treatment team and said, "I need to shake it off and move forward."    Continue to monitor medication management and MD orders.  Safety checks completed every 15 minutes.  Encourage patient to attend groups and participate in her treatment.  Reassure patient she is safe on the unit.

## 2012-05-24 NOTE — Progress Notes (Signed)
D: In dayroom watching TV with peers on approach. Appears anxious and sad. Cooperative with assessment. MHT notified writer that pt was refusing to have lab work drawn. Writer discussed with pt who endorsed a great deal of anxiety and fear r/t having her blood drawn. States she was in the ED and they tried to draw her blood, something went wrong and then there was blood everywhere. Writer encouraged her to have her blood drawn and offered to personally walk with and sit with her while she had it done. Pt agreed to conditions and was able to have her blood drawn without incident. Throughout pt appeared to be responding to internal stimuli but she denied AVH. She also denies SI/HI and contracts for safety.  A: Support and encouragement provided. Safety has been maintained with Q15 minute observation. POC and medications for the shift reviewed and understanding verbalized. Meds given as ordered by MD.  R: Pt remains safe. She is somewhat guarded and appears to be responding to internal stimuli. She is cooperative, is attending programming and is compliant with medications. Will continue Q15 minute observation and continue current POC.

## 2012-05-24 NOTE — Treatment Plan (Signed)
Interdisciplinary Treatment Plan Update (Adult)  Date: 05/24/2012  Time Reviewed: 9:40 AM   Progress in Treatment: Attending groups: Yes Participating in groups: Yes Taking medication as prescribed: Yes Tolerating medication: Yes   Family/Significant other contact made: Yes  Patient understands diagnosis:  Yes  As evidenced by asking for help with things to be better Discussing patient identified problems/goals with staff:  Yes  See below Medical problems stabilized or resolved:  Yes Denies suicidal/homicidal ideation: Yes  In tx team Issues/concerns per patient self-inventory:  Not filled out Other:  New problem(s) identified: N/A  Reason for Continuation of Hospitalization: Medication stabilization  Interventions implemented related to continuation of hospitalization: Re-start medications,  Contact mother, possible referral to ACT team,  Encourage group attendance and participation  Additional comments:"I just want to move forward.  I need things to be different at home."  Estimated length of stay :2-3 days  Discharge Plan: Return home, follow up outpt  New goal(s): N/A  Review of initial/current patient goals per problem list:   1.  Goal(s): Eliminate SI  Met:  Yes  Target date:9/30  As evidenced by: self report  2.  Goal (s): Eliminate psychosis  Met:  No  Target date:10/3  As evidenced [id number] report/observation  3.  Goal(s):Identify comprehensive mental wellness plan  Met:  No  Target date:10/3  As evidenced by: self report/CM coordination  4.  Goal(s):  Met:  No  Target date:  As evidenced by:  Attendees: Patient:  [name] 05/24/2012 9:40 AM  Family:     Physician:  Harvie Heck Readling 05/24/2012 9:40 AM   Nursing: Epifania Gore   05/24/2012 9:40 AM   Case Manager:  Richelle Ito  05/24/2012 9:40 AM   Counselor:  Veto Kemps 05/24/2012 9:40 AM   Other:     Other:     Other:     Other:      Scribe for Treatment Team:   Ida Rogue,  05/24/2012 9:40 AM

## 2012-05-24 NOTE — Progress Notes (Signed)
[facility] MD Progress Note  05/24/2012 4:37 PM  Current Mental Status Per Physician:  Diagnosis:  Axis I: Schizoaffective Disorder - Bipolar Type.   The patient was seen today and reports the following:   ADL's: Intact.  Sleep: The patient reports to having some difficulty initiating and maintaining sleep.  Appetite: The patient reports that her appetite is good today.   Mild>(1-10) >Severe  Hopelessness (1-10): 0  Depression (1-10): 0  Anxiety (1-10): 0   Suicidal Ideation: The patient denies any current suicidal ideations.  Plan: No  Intent: No  Means: No   Homicidal Ideation: The patient denies any homicidal ideations today.  Plan: No  Intent: No.  Means: No   General Appearance/Behavior: The patient was minimally cooperative today with this provider and in no apparent distress.  Eye Contact: Fair.  Speech: Appropriate in rate and volume but with very little spontaneous speech today.  Motor Behavior: wnl.  Level of Consciousness: Alert and Oriented x 3.  Mental Status: Alert and Oriented x 3.  Mood: Appears moderately depressed.  Affect: Appears moderately constricted.  Anxiety Level: No anxiety reported today.  Thought Process: The patient denies any auditory or visual hallucinations today or delusional thinking.  She however appears to be responding to internal stimuli. Thought Content: The patient denies any auditory or visual hallucinations today or delusional thinking.  She however appears to be responding to internal stimuli. Perception: The patient denies any auditory or visual hallucinations today or delusional thinking.   She however appears to be responding to internal stimuli. Judgment: Poor.  Insight: Poor  Cognition: Oriented to person, place and time.  Sleep:  Number of Hours: 5.5    Vital Signs:Blood pressure 96/60, pulse 60, temperature 98.4 F (36.9 C), temperature source Oral, resp. rate 20, height 5' 0.5" (1.537 m), weight 61.689 kg (136 lb).  Current  Medications: Current Facility-Administered Medications  Medication Dose Route Frequency Provider Last Rate Last Dose  . acetaminophen (TYLENOL) tablet 650 mg  650 mg Oral Q6H PRN Caroleen Hamman, NP      . alum & mag hydroxide-simeth (MAALOX/MYLANTA) 200-200-20 MG/5ML suspension 30 mL  30 mL Oral Q4H PRN Caroleen Hamman, NP      . benztropine (COGENTIN) tablet 1 mg  1 mg Oral BH-qamhs Phoebie Shad D Aneka Fagerstrom, MD      . lithium carbonate (ESKALITH) CR tablet 450 mg  450 mg Oral BH-qamhs Burdett Pinzon D Ariz Terrones, MD      . magnesium hydroxide (MILK OF MAGNESIA) suspension 30 mL  30 mL Oral Daily PRN Caroleen Hamman, NP   30 mL at 05/22/12 0812  . traZODone (DESYREL) tablet 50 mg  50 mg Oral QHS PRN Caroleen Hamman, NP   50 mg at 05/23/12 2121  . ziprasidone (GEODON) capsule 120 mg  120 mg Oral QHS Javen Ridings D Celicia Minahan, MD      . ziprasidone (GEODON) capsule 60 mg  60 mg Oral QHS Curlene Labrum Madisan Bice, MD      . DISCONTD: benztropine (COGENTIN) tablet 0.5 mg  0.5 mg Oral BID Nehemiah Settle, MD   0.5 mg at 05/24/12 0759  . DISCONTD: lithium carbonate (ESKALITH) CR tablet 450 mg  450 mg Oral Q12H Nehemiah Settle, MD   450 mg at 05/24/12 0759  . DISCONTD: ziprasidone (GEODON) capsule 60 mg  60 mg Oral BID WC Nehemiah Settle, MD   60 mg at 05/24/12 0759   Lab Results: No results found for this or any previous visit (from the past  48 hour(s)).  Physical Findings: AIMS: Facial and Oral Movements Muscles of Facial Expression: None, normal Lips and Perioral Area: None, normal Jaw: None, normal Tongue: None, normal,Extremity Movements Upper (arms, wrists, hands, fingers): None, normal Lower (legs, knees, ankles, toes): None, normal, Trunk Movements Neck, shoulders, hips: None, normal, Overall Severity Severity of abnormal movements (highest score from questions above): None, normal Incapacitation due to abnormal movements: None, normal Patient's awareness of abnormal movements (rate only patient's  report): No Awareness, Dental Status Current problems with teeth and/or dentures?: No Does patient usually wear dentures?: No   Review of Systems:  Neurological: No headaches, seizures or dizziness reported.  G.I.: The patient denies any constipation or stomach upset today.  Musculoskeletal: The patient denies any musculoskeletal related issues.   Time was spent today discussing with the patient her current symptoms. The patient reports to sleeping reasonably well last night and according to staff she slept 5.5 hours. She reports that her appetite is good today and she denies any significant feelings of sadness, anhedonia or depressed mood. However, the patient does appear to be moderately depressed.  She denies any anxiety symptoms today and denies any suicidal and homicidal ideations.  In addition, the patient denies any auditory or visual hallucinations or delusional thinking, but appears to be responding to internal stimuli.  The patient denies any current medications related side effects.  The patient states that she is here due to "wanting to do things right and more forward."  She was unable to provide more details on what specifically she meant by this.  She was also unable to state whether she was taking her medications prior to admission.  Treatment Plan Summary:  1. Daily contact with patient to assess and evaluate symptoms and progress in treatment.  2. Medication management  3. The patient will deny suicidal ideations or homicidal ideations for 48 hours prior to discharge and have a depression and anxiety rating of 3 or less. The patient will also deny any auditory or visual hallucinations or delusional thinking.  4. The patient will deny any symptoms of substance withdrawal at time of discharge.   Plan:  1.  Will continue the patient on the medication Eskalith CR at 450 mgs po q am and hs for mood stabilization. 2.  Will continue the patient on the medication Geodon but will change  the dosing to time to what it was at her last discharge which is 120 mgs po qhs starting tomorrow.  This medication          will be for mood stabilization and psychosis. 3.  Will continue the patient on the medication Cogentin at 1 mg po q am and hs for possible EPS. 4.  Will continue the patient on the medication Trazodone 50 mgs po qhs with a repeat - prn for sleep. 5.  Laboratory Studies reviewed.  6.  Will order a TSH, Free T3 and Free T4 to evaluate the patient's Thyroid Functioning. 7.  Will order a EKG to evaluate the patient's QTc since the patient is on Geodon. 8.  Will continue to monitor.    [name] 05/24/2012, 4:37 PM

## 2012-05-24 NOTE — Discharge Planning (Signed)
New patient attended AM group.  Responded to structure of being asked to sit up as she was lying down on love seat when group started.  Continued to hide under blanket.  Difficult to understand when she responded to questions, but responses were generally vague.  Lives with mother.  Alluded to medications and issue of whether she is taking them or not.  Possible referral to ACT team as she has been here recently.

## 2012-05-24 NOTE — Progress Notes (Signed)
Psychoeducational Group Note  Date:  05/24/2012 Time:  1100  Group Topic/Focus:  Self Care:   The focus of this group is to help patients understand the importance of self-care in order to improve or restore emotional, physical, spiritual, interpersonal, and financial health.  Participation Level: Did Not Attend  Participation Quality:  Not Applicable  Affect:  Not Applicable  Cognitive:  Not Applicable  Insight:  Not Applicable  Engagement in Group: Not Applicable  Additional Comments:  Pt refused to attend group this morning.  Bralyn Espino E 05/24/2012, 1:45 PM

## 2012-05-24 NOTE — Progress Notes (Signed)
Psychoeducational Group Note  Date:  05/24/2012 Time:  2000  Group Topic/Focus:  Wrap-Up Group:   The focus of this group is to help patients review their daily goal of treatment and discuss progress on daily workbooks.  Participation Level:  Active  Participation Quality:  Appropriate  Affect:  Appropriate  Cognitive:  Appropriate  Insight:  Good  Engagement in Group:  Good  Additional Comments:  Patient attended and participated in group tonight. She reported that she had a good day. Her mom visited, she went out side and it was fun. She attended groups and socialized with peers. Patient reports that she care for herself by taking care of herself.   Lita Mains [facility] 05/24/2012, 8:31 PM

## 2012-05-25 LAB — T3, FREE: T3, Free: 2.8 pg/mL (ref 2.3–4.2)

## 2012-05-25 LAB — LITHIUM LEVEL: Lithium Lvl: 0.71 mEq/L — ABNORMAL LOW (ref 0.80–1.40)

## 2012-05-25 NOTE — Progress Notes (Signed)
[facility] MD Progress Note  05/25/2012 4:23 PM  Current Mental Status Per Physician:  Diagnosis:  Axis I: Schizoaffective Disorder - Bipolar Type.   The patient was seen today and reports the following:   ADL's: Intact.  Sleep: The patient reports to sleeping well without difficulty.  Appetite: The patient reports that her appetite is good today.   Mild>(1-10) >Severe  Hopelessness (1-10): 0  Depression (1-10): 2  Anxiety (1-10): 0   Suicidal Ideation: The patient denies any current suicidal ideations.  Plan: No  Intent: No  Means: No   Homicidal Ideation: The patient denies any homicidal ideations today.  Plan: No  Intent: No.  Means: No   General Appearance/Behavior: The patient was much more cooperative today with this provider and in no apparent distress.  Eye Contact: Fair to Good.  Speech: Appropriate in rate and volume with no pressuring of speech noted and much more spontaneous speech.  Motor Behavior: wnl.  Level of Consciousness: Alert and Oriented x 3.  Mental Status: Alert and Oriented x 3.  Mood: Appears essentially euthymic today.  Affect: Appears mildly constricted.  Anxiety Level: No anxiety reported today.  Thought Process: The patient denies any auditory or visual hallucinations today or delusional thinking.   Thought Content: The patient denies any auditory or visual hallucinations today or delusional thinking.  Perception: The patient denies any auditory or visual hallucinations today or delusional thinking.  Judgment: Fair.  Insight: Fair.  Cognition: Oriented to person, place and time.  Sleep:  Number of Hours: 6.25    Vital Signs:Blood pressure 110/65, pulse 62, temperature 97.3 F (36.3 C), temperature source Oral, resp. rate 16, height 5' 0.5" (1.537 m), weight 61.689 kg (136 lb).  Current Medications: Current Facility-Administered Medications  Medication Dose Route Frequency Provider Last Rate Last Dose  . acetaminophen (TYLENOL) tablet 650 mg   650 mg Oral Q6H PRN Caroleen Hamman, NP      . alum & mag hydroxide-simeth (MAALOX/MYLANTA) 200-200-20 MG/5ML suspension 30 mL  30 mL Oral Q4H PRN Caroleen Hamman, NP      . benztropine (COGENTIN) tablet 1 mg  1 mg Oral BH-qamhs Curlene Labrum Zakya Halabi, MD   1 mg at 05/25/12 0859  . lithium carbonate (ESKALITH) CR tablet 450 mg  450 mg Oral BH-qamhs Curlene Labrum Mylia Pondexter, MD   450 mg at 05/25/12 0859  . magnesium hydroxide (MILK OF MAGNESIA) suspension 30 mL  30 mL Oral Daily PRN Caroleen Hamman, NP   30 mL at 05/22/12 0812  . traZODone (DESYREL) tablet 50 mg  50 mg Oral QHS PRN Caroleen Hamman, NP   50 mg at 05/23/12 2121  . ziprasidone (GEODON) capsule 120 mg  120 mg Oral QHS Tarini Carrier D Candiss Galeana, MD      . ziprasidone (GEODON) capsule 60 mg  60 mg Oral QHS Curlene Labrum Myleen Brailsford, MD   60 mg at 05/24/12 2203  . DISCONTD: benztropine (COGENTIN) tablet 0.5 mg  0.5 mg Oral BID Nehemiah Settle, MD   0.5 mg at 05/24/12 0759  . DISCONTD: lithium carbonate (ESKALITH) CR tablet 450 mg  450 mg Oral Q12H Nehemiah Settle, MD   450 mg at 05/24/12 0759  . DISCONTD: ziprasidone (GEODON) capsule 60 mg  60 mg Oral BID WC Nehemiah Settle, MD   60 mg at 05/24/12 1610   Lab Results:  Results for orders placed during the hospital encounter of 05/21/12 (from the past 48 hour(s))  TSH     Status: Normal  Collection Time   05/24/12  8:12 PM      Component Value Range Comment   TSH 1.281  0.350 - 4.500 uIU/mL   T4, FREE     Status: Normal   Collection Time   05/24/12  8:12 PM      Component Value Range Comment   Free T4 1.13  0.80 - 1.80 ng/dL   T3, FREE     Status: Normal   Collection Time   05/24/12  8:12 PM      Component Value Range Comment   T3, Free 2.8  2.3 - 4.2 pg/mL    Physical Findings: AIMS: Facial and Oral Movements Muscles of Facial Expression: None, normal Lips and Perioral Area: None, normal Jaw: None, normal Tongue: None, normal,Extremity Movements Upper (arms, wrists, hands, fingers):  None, normal Lower (legs, knees, ankles, toes): None, normal, Trunk Movements Neck, shoulders, hips: None, normal, Overall Severity Severity of abnormal movements (highest score from questions above): None, normal Incapacitation due to abnormal movements: None, normal Patient's awareness of abnormal movements (rate only patient's report): No Awareness, Dental Status Current problems with teeth and/or dentures?: No Does patient usually wear dentures?: No  CIWA:  CIWA-Ar Total: 1  COWS:  COWS Total Score: 1   Review of Systems:  Neurological: No headaches, seizures or dizziness reported.  G.I.: The patient denies any constipation or stomach upset today.  Musculoskeletal: The patient denies any musculoskeletal related issues.   Time was spent today discussing with the patient her current symptoms. The patient reports to sleeping well last night and according to staff she slept 6.25 hours. She reports that her appetite is good today and she reports some mild feelings of sadness, anhedonia and depressed mood.  She denies any anxiety symptoms today and denies any suicidal and homicidal ideations. In addition, the patient denies any auditory or visual hallucinations or delusional thinking as well as any medication related side effects.  The patient is asking for discharge soon and this will be discussed in the morning after the team has an opportunity to obtain collateral information from her Mother.  Treatment Plan Summary:  1. Daily contact with patient to assess and evaluate symptoms and progress in treatment.  2. Medication management  3. The patient will deny suicidal ideations or homicidal ideations for 48 hours prior to discharge and have a depression and anxiety rating of 3 or less. The patient will also deny any auditory or visual hallucinations or delusional thinking.  4. The patient will deny any symptoms of substance withdrawal at time of discharge.   Plan:  1. Will continue the  patient on the medication Eskalith CR at 450 mgs po q am and hs for mood stabilization.  2. Will continue the patient on the medication Geodon at 120 mgs po qhs for mood stabilization and psychosis.  3. Will continue the patient on the medication Cogentin at 1 mg po q am and hs for possible EPS.  4. Will continue the patient on the medication Trazodone 50 mgs po qhs with a repeat - prn for sleep.  5. Laboratory Studies reviewed.  6. Will order a serum lithium level for this evening. 7. Will order a EKG to evaluate the patient's QTc since the patient is on Geodon.  This is pending. 8. Will continue to monitor.   Dorion Petillo 05/25/2012, 4:23 PM

## 2012-05-25 NOTE — Discharge Planning (Signed)
[name] attended AM group.  Looked much brighter and engaged than yesterday, but still having some word finding challenges.  States she hopes to return home soon.

## 2012-05-25 NOTE — Progress Notes (Signed)
D:   Patient's self inventory sheet, patient sleeps well, has good appetite, normal energy level, good attention span.  Denied depression, denied hopelessness, denied anxiety.   Denied withdrawals.   Denied SI.  No physical problems in past 24 hours.  After discharge, plans to going to mom's and live with family, wants everything to get back to normal.  Not sure about meds.  Makes her feel sleepy in morning.  Feels like she needs to have a life.   Maybe going to wellness after discharge.   A:  Medications given per MD order.  Support and encouragement given throughout day.   Support and safety checks completed as ordered. R:   Following treatment plan.   Denied SI and HI.   Denied A/V hallucinations.  Contracts for safety.  Patient remains safe and receptive on unit.

## 2012-05-25 NOTE — Progress Notes (Signed)
D: Patient in day room on approach.  Patient is child-like and giggles while speaking and speech is tangential.  Patient is suspicious and paranoid.  Patient denies depression but appears anxious at time.  Patient states she is here because she was not taking her medications like she is supposed to.  Patient states from now on she is going to take her medications and she has learned why she needs to continue.  Patient denies SI/HI and denies AVH but does appear to be responding to internal stimuli.  Patient visible on the unit. A: Staff to monitor Q 15 mins for safety.  Encouragement and support offered.  Scheduled medications administered per orders.  EKG performed by Clinical research associate. R: Patient remains safe on the unit.  Patient taking medications but was apprehensive about increased dose of geodon.  Patient remains calm and cooperative.

## 2012-05-25 NOTE — Progress Notes (Signed)
Psychoeducational Group Note  Date:  05/25/2012 Time:  0930  Group Topic/Focus:  Recovery Goals:   The focus of this group is to identify appropriate goals for recovery and establish a plan to achieve them.  Participation Level: Did Not Attend  Participation Quality:  Not Applicable  Affect:  Not Applicable  Cognitive:  Not Applicable  Insight:  Not Applicable  Engagement in Group: Not Applicable  Additional Comments:  Pt was unable to attend group this morning.  Lelani Garnett E 05/25/2012, 2:00 PM

## 2012-05-25 NOTE — Progress Notes (Signed)
[facility] Group Notes:  (Counselor/Nursing/MHT/Case Management/Adjunct)  05/25/2012 3:52 PM  Type of Therapy:  Group Therapy  05/24/12  Participation Level:  Minimal  Participation Quality:  Attentive and Sharing  Affect:  Blunted  Cognitive:  Oriented  Insight:  None  Engagement in Group:  Limited  Engagement in Therapy:  Limited  Modes of Intervention:  Clarification, Education and Support  Summary of Progress/Problems: Patient talked about doing well and not needing her medications. Still questions if she really needs medications.   [name], Aram Beecham 05/25/2012, 3:52 PM

## 2012-05-26 MED ORDER — CARBAMAZEPINE ER 200 MG PO TB12
400.0000 mg | ORAL_TABLET | Freq: Every day | ORAL | Status: DC
  Administered 2012-05-26 – 2012-05-27 (×2): 400 mg via ORAL
  Filled 2012-05-26 (×3): qty 1
  Filled 2012-05-26: qty 28

## 2012-05-26 NOTE — Progress Notes (Signed)
Psychoeducational Group Note  Date:  05/26/2012 Time:  2000  Group Topic/Focus:  Wrap-Up Group:   The focus of this group is to help patients review their daily goal of treatment and discuss progress on daily workbooks.  Participation Level:  Minimal  Participation Quality:  Appropriate  Affect:  Appropriate  Cognitive:  Alert  Insight:  Good  Engagement in Group:  Good  Additional Comments:    Aidel Davisson R 05/26/2012, 9:08 PM

## 2012-05-26 NOTE — Progress Notes (Addendum)
D: Patient denies SI/HI and auditory and visual hallucinations but the patient seems to be responding to internal stimuli at times. The patient will become fixated on a random point and start mumbling to herself. When questioned the patient will say "it's nothing." The patient has a preoccupied mood and affect. The patient rates her depression and hopelessness both 0 out of 10 (1 low/10 high). The patient reports sleeping poorly and states that her appetite and energy level are normal. The patient refused her morning medications stating that they make her "too sleepy."  A: Patient given emotional support from RN. Patient encouraged to come to staff with concerns and/or questions. Patient's medication routine continued. Patient's orders and plan of care reviewed. After encouragement from the treatment team (MD, RN, Counselor, Case Management) the patient agreed to take her medications.  R: Patient still seems to be responding to internal stimuli but denies when asked. Will continue to monitor patient q15 minutes for safety.

## 2012-05-26 NOTE — Tx Team (Addendum)
Interdisciplinary Treatment Plan Update (Adult)  Date: 05/26/2012  Time Reviewed: 10:05 AM   Progress in Treatment: Attending groups: Yes Participating in groups:  Yes Taking medication as prescribed: No, refusing due to "making me sleepy". Tolerating medication:  Yes Family/Significant othe contact made:   Patient understands diagnosis:  Yes Discussing patient identified problems/goals with staff:  Yes Medical problems stabilized or resolved:  Yes Denies suicidal/homicidal ideation: Yes Issues/concerns per patient self-inventory:  None identified Other: N/A  New problem(s) identified: Patient sedated in AM. Geodon dosage decreased.  Reason for Continuation of Hospitalization: Medication stabilization   Interventions implemented related to continuation of hospitalization: mood stabilization, medication monitoring and adjustment, group therapy and psycho education, safety checks q 15 mins  Additional comments: N/A  Estimated length of stay: 1-3 days  Discharge Plan: F/U scheduled with [facility] walk-in clinic  New goal(s): Patient will need to routinely take medications prior to D/C.  Review of initial/current patient goals per problem list:   1.  Goal(s): Reduce depressive symptoms  Met:  Yes  Target date: by discharge  As evidenced by: Reducing depression from a 10 to a 0 as reported by pt.   2.  Goal (s): Eliminate Suicidal Ideation  Met:  Yes  As evidenced by: Eliminate suicidal ideation.   3.  Goal(s): Reduce Psychosis  Met:  Yes  As evidenced by: Reduce psychotic symptoms to baseline, as reported by pt.     Attendees: Patient:  [name] 05/26/2012 10:02 AM   Family:     Physician:  Franchot Gallo, MD 05/26/2012 10:02 AM   Nursing:      Case Manager:  Barrie Folk RN MS EdS 05/26/2012 10:02 AM   Counselor:  Veto Kemps, MT-BC 05/26/2012 10:02 AM   Other:  Kathlee Nations RN 05/26/2012 10:02 AM   Other:  Shelda Jakes PA 05/26/2012 10:04 AM   Other:  Carolynn Comment RN 05/26/2012 10:04 AM   Other:  Nestor Ramp RN 05/26/2012 10:04 AM   Scribe for Treatment Team:   Damira Kem 05/26/2012 10:04 AM

## 2012-05-26 NOTE — Progress Notes (Signed)
[facility] MD Progress Note  05/26/2012 2:18 PM  Current Mental Status Per Physician:  Diagnosis:  Axis I: Schizoaffective Disorder - Bipolar Type.   The patient was seen today and reports the following:   ADL's: Intact.  Sleep: The patient reports to sleeping well without difficulty.  Appetite: The patient reports that her appetite is good today.   Mild>(1-10) >Severe  Hopelessness (1-10): 0  Depression (1-10): 0 Anxiety (1-10): 0   Suicidal Ideation: The patient denies any current suicidal ideations.  Plan: No  Intent: No  Means: No   Homicidal Ideation: The patient denies any homicidal ideations today.  Plan: No  Intent: No.  Means: No   General Appearance/Behavior: The patient was minimally cooperative today with this provider and was refusing medications during our meeting. Eye Contact: Fair.  Speech: Appropriate in rate and volume with no pressuring of speech noted with little spontaneous speech.  Motor Behavior: Slowed.  Level of Consciousness: Sedated and Oriented x 3.  Mental Status: Sedated and Oriented x 3.  Mood: Appears moderately depressed today.  Affect: Appears moderately constricted.  Anxiety Level: No anxiety reported today.  Thought Process: The patient denies any auditory or visual hallucinations today or delusional thinking but appears to be responding to internal stimuli. Thought Content: The patient denies any auditory or visual hallucinations today or delusional thinking but appears to be responding to internal stimuli. Perception: The patient denies any auditory or visual hallucinations today or delusional thinking but appears to be responding to internal stimuli. Judgment: Poor.  Insight: Poor.  Cognition: Oriented to person, place and time.  Sleep:  Number of Hours: 6.75    Vital Signs:Blood pressure 106/75, pulse 94, temperature 98.2 F (36.8 C), temperature source Oral, resp. rate 16, height 5' 0.5" (1.537 m), weight 61.689 kg (136 lb).  Current  Medications: Current Facility-Administered Medications  Medication Dose Route Frequency Provider Last Rate Last Dose  . acetaminophen (TYLENOL) tablet 650 mg  650 mg Oral Q6H PRN Caroleen Hamman, NP      . alum & mag hydroxide-simeth (MAALOX/MYLANTA) 200-200-20 MG/5ML suspension 30 mL  30 mL Oral Q4H PRN Caroleen Hamman, NP      . carbamazepine (TEGRETOL XR) 12 hr tablet 400 mg  400 mg Oral QHS Estefano Victory D Osmani Kersten, MD      . magnesium hydroxide (MILK OF MAGNESIA) suspension 30 mL  30 mL Oral Daily PRN Caroleen Hamman, NP   30 mL at 05/22/12 0812  . traZODone (DESYREL) tablet 50 mg  50 mg Oral QHS PRN Caroleen Hamman, NP   50 mg at 05/23/12 2121  . ziprasidone (GEODON) capsule 120 mg  120 mg Oral QHS Curlene Labrum Christohper Dube, MD   120 mg at 05/25/12 2145  . DISCONTD: benztropine (COGENTIN) tablet 1 mg  1 mg Oral BH-qamhs Curlene Labrum Giulio Bertino, MD   1 mg at 05/25/12 2145  . DISCONTD: lithium carbonate (ESKALITH) CR tablet 450 mg  450 mg Oral BH-qamhs Curlene Labrum Kentrel Clevenger, MD   450 mg at 05/26/12 1000   Lab Results:  Results for orders placed during the hospital encounter of 05/21/12 (from the past 48 hour(s))  TSH     Status: Normal   Collection Time   05/24/12  8:12 PM      Component Value Range Comment   TSH 1.281  0.350 - 4.500 uIU/mL   T4, FREE     Status: Normal   Collection Time   05/24/12  8:12 PM      Component Value  Range Comment   Free T4 1.13  0.80 - 1.80 ng/dL   T3, FREE     Status: Normal   Collection Time   05/24/12  8:12 PM      Component Value Range Comment   T3, Free 2.8  2.3 - 4.2 pg/mL   LITHIUM LEVEL     Status: Abnormal   Collection Time   05/25/12  8:19 PM      Component Value Range Comment   Lithium Lvl 0.71 (*) 0.80 - 1.40 mEq/L    Physical Findings: AIMS: Facial and Oral Movements Muscles of Facial Expression: None, normal Lips and Perioral Area: None, normal Jaw: None, normal Tongue: None, normal,Extremity Movements Upper (arms, wrists, hands, fingers): None, normal Lower (legs,  knees, ankles, toes): None, normal, Trunk Movements Neck, shoulders, hips: None, normal, Overall Severity Severity of abnormal movements (highest score from questions above): None, normal Incapacitation due to abnormal movements: None, normal Patient's awareness of abnormal movements (rate only patient's report): No Awareness, Dental Status Current problems with teeth and/or dentures?: No Does patient usually wear dentures?: No  CIWA:  CIWA-Ar Total: 1  COWS:  COWS Total Score: 1   Review of Systems:  Neurological: No headaches, seizures or dizziness reported.  G.I.: The patient denies any constipation or stomach upset today.  Musculoskeletal: The patient denies any musculoskeletal related issues.   Time was spent today discussing with the patient her current symptoms. The patient reports to sleeping well last night and according to staff she slept 6.25 hours. She reports that her appetite is good today and she denies any feelings of sadness, anhedonia or depressed mood.   However, the patient appears to be moderately depressed today.  She denies any anxiety symptoms today and denies any suicidal and homicidal ideations.  In addition, the patient denies any auditory or visual hallucinations or delusional thinking but appears to be responding to internal stimuli.  The patient is refusing to take the medication Lithium Carbonate today and her Mother states this is due to "vaginal dryness."  The patient's Mother also states that she does not want her daughter on Depakote.  Treatment Plan Summary:  1. Daily contact with patient to assess and evaluate symptoms and progress in treatment.  2. Medication management  3. The patient will deny suicidal ideations or homicidal ideations for 48 hours prior to discharge and have a depression and anxiety rating of 3 or less. The patient will also deny any auditory or visual hallucinations or delusional thinking.  4. The patient will deny any symptoms of  substance withdrawal at time of discharge.   Plan:  1. Will discontinue the medication Eskalith CR since the patient is refusing this medications and is complaining of side effects. 2. Will start the patient on the medication Tegretol XR at 400 mgs po qhs for mood stabilization. 3. Will continue the patient on the medication Geodon at 120 mgs po qhs for mood stabilization and psychosis.  4. Will discontinue the medication Cogentin due to complaints of dryness.  5. Will continue the patient on the medication Trazodone 50 mgs po qhs with a repeat - prn for sleep.  6. Laboratory Studies reviewed.  7. Will continue to monitor.   Brodyn Depuy 05/26/2012, 2:18 PM

## 2012-05-26 NOTE — Discharge Planning (Signed)
Pt did not attend morning aftercare planning group but came to tx team. She reported feeling overly sleepy and was slurring her words with speech delay. Pt stated that she had taken her meds but nurse advised tx team that pt had refused meds this morning. Meds brought in for pt to take and she reluctantly took pills. Pt advised that her mother would only take her home if she was compliant with medication. Pt reported having a good appetite, rated her depression/anxiety/hoplessness at a 0, no s/i or h/i, and no voices. Pt will follow-up at [facility].

## 2012-05-26 NOTE — Progress Notes (Signed)
Patient ID: [name], female   DOB: 18-Mar-1986, 26 y.o.   MRN: [id number] D: Pt. In sitting in bed reading magazines. Pt. Reports "I'm okay."  Pt. Reports not taking meds "I'm not a morning person so when I don't take my medicine I'm not being mean I just need to focus in the morning"  "I'm not in school now so I have a tendency to sleep in" A: Writer explained to pt. That nurses have been given a time frame to administer meds and they are not trying to be mean but we are designated to administer meds within that time frame. Pt. Says "well they should have told me that." Staff will monitor pt. For safety q69min. Pt. Encouraged to go to group. R: Pt. Is safe on the unit. Pt. Verbalized she understands the need to take meds as ordered. Pt. Reports she has no problems taking night meds. Pt. Attends group.

## 2012-05-27 LAB — CARBAMAZEPINE LEVEL, TOTAL: Carbamazepine Lvl: 3.8 ug/mL — ABNORMAL LOW (ref 4.0–12.0)

## 2012-05-27 NOTE — Progress Notes (Signed)
Patient ID: [name], female   DOB: [birth year], 26 y.o.   MRN: [id number] D: Pt. Visits with mom earlier this evening. Pt. Resistant to speak with writer "what" "I just want to go home, that's all" "anticipating that even eager to get out of this situation" A: Pt. Will be monitored q75min for safety. Staff encouraged group. R: Pt. Is safe on the unit. Pt. Attends karaoke.

## 2012-05-27 NOTE — Progress Notes (Signed)
Psychoeducational Group Note  Date:  05/27/2012 Time:  0930  Group Topic/Focus:  Rediscovering Joy:   The focus of this group is to explore various ways to relieve stress in a positive manner.  Participation Level: Did Not Attend  Participation Quality:  Not Applicable  Affect:  Not Applicable  Cognitive:  Not Applicable  Insight:  Not Applicable  Engagement in Group: Not Applicable  Additional Comments:  Pt was in the shower and could not attend group.  Jennings Corado E 05/27/2012, 11:44 AM

## 2012-05-27 NOTE — Progress Notes (Signed)
[facility] Group Notes:  (Counselor/Nursing/MHT/Case Management/Adjunct)  05/27/2012 5:19 PM  Type of Therapy:  Group Therapy  11:00, Music Therapy 1:15  Participation Level:  Active  Participation Quality:  Attentive and Sharing  Affect:  Appropriate and Blunted  Cognitive:  Oriented  Insight:  Limited  Engagement in Group:  Good  Engagement in Therapy:  Good  Modes of Intervention:  Education, Problem-solving and Support  Summary of Progress/Problems: Patient was more active and animated in groups. She talked about being positive for herself and can stay motivated. Says she will take her medications but continues to not be convinced that she needs them.   [name] 05/27/2012, 5:19 PM

## 2012-05-27 NOTE — Progress Notes (Signed)
[facility] In Patient Progress Note 05/27/2012 2:32 PM [name] Apr 27, 1986 [id number] Hospital day #:6 Diagnosis:  Axis I: Schizoaffective Disorder - Bipolar Type Met with the patient who reports the following: ADL's:  Intact Sleep:  Greatly improved Appetite:ok  Groups:Good  Subjective: Raley states she is much better today having had her medication changed to reduce the side effects of vaginal dryness. She is anxious to go home and has spoken with her mother about this.   height is 5' 0.5" (1.537 m) and weight is 61.689 kg (136 lb). Her oral temperature is 98 F (36.7 C). Her blood pressure is 94/60 and her pulse is 61. Her respiration is 16.   Objective: Patient looks better and is more alert and aware.  Good return to base line.  Ros: Constitutional: WD WN Adult in NAD ENT:      negative for runny nose, sore throat, congestion, dysphagia COR:     negative for cough, SOB, chest pain, wheezing GI:         negative for Nausea, vomiting, diarrhea, constipation, abdominal pain Neuro:  negative for dizziness, blurred vision, headaches, numbness or tingling Ortho:   negative for limb pain, swelling, change in ambulatory status.  Mental Status Exam Level of Consciousness: awake Orientation: x 3 General Appearance:  casual Behavior:  cooperative Eye Contact:  good Motor Behavior:  normal Speech:  clear Mood: euthymic Suicidal Ideation: No suicidal ideation, no plan, no intent, no means. Homicidal Ideation:  No homicidal ideation, no plan, no intent, no means.  Affect:  congruent Anxiety Level: mild, moderate, severe Thought Process:  linear Thought Content:  No AVH Perception:  intact Judgment:  fair,  Insight:  fair Cognition:  average,  Sleep:  Number of Hours: 4.5  Lab Results:  Results for orders placed during the hospital encounter of 05/21/12 (from the past 48 hour(s))  LITHIUM LEVEL     Status: Abnormal   Collection Time   05/25/12  8:19 PM      Component Value Range  Comment   Lithium Lvl 0.71 (*) 0.80 - 1.40 mEq/L    Labs are reviewed. Physical Findings: AIMS: CIWA-Ar Total: 1  CIWA:  CIWA-Ar Total: 1  COWS:  COWS Total Score: 1  Medication:  . carbamazepine  400 mg Oral QHS  . ziprasidone  120 mg Oral QHS    Treatment Plan Summary: 1. Continue for crisis management and stabilization. 2. Medication management to reduce current symptoms to base line and improve the patient's overall level of functioning 3. Treat health problems as indicated. 4. Develop treatment plan to decrease risk of relapse upon discharge and the need for         readmission. 5. Psycho-social education regarding relapse prevention and self care. 6. Health care follow up as needed for medical problems. 7. Restart home medications where appropriate.   Plan:  1.Will continue the patient on the medication Tegretol XR at 400 mgs po qhs for mood stabilization.  2.Will continue the patient on the medication Geodon at 120 mgs po qhs for mood stabilization and psychosis.  3. Will continue the patient on the medication Trazodone 50 mgs po qhs with a repeat - prn for sleep.  4 Will order tegratol level.  5. Will continue to monitor.  6. Will follow today and anticipate discharge home tomorrow if no complications from changes in medication.  Rona Ravens. Selam Pietsch PAC 05/27/2012, 2:32 PM

## 2012-05-27 NOTE — Progress Notes (Signed)
[facility] Group Notes:  (Counselor/Nursing/MHT/Case Management/Adjunct)  05/27/2012 5:18 PM  Type of Therapy:  Psychoeducational Skills 05/26/12 Participation Level:  Minimal  Participation Quality:  Attentive  Affect:  Flat  Cognitive:  Oriented  Insight:  Limited  Engagement in Group:  Limited  Engagement in Therapy:  Limited  Modes of Intervention:  Education and Support  Summary of Progress/Problems: Patient was attentive to speaker from mental health association.   Miner Koral, Aram Beecham 05/27/2012, 5:18 PM

## 2012-05-27 NOTE — Discharge Planning (Signed)
Pt came to morning aftercare planning group and tx team. She stated that she was "fed up" with being in current mental state and is ready to move forward with her life. Pt reported feeling mentally clearer today and is trying to cope with having to move from one room to another in the hall. Pt interested in getting information about mobile crisis management in case she feels the need to talk to somebody or reach out for help in the future. Pt provided with mobile crisis info after session. Pt following up with [facility] post d/c and is hoping to d/c Friday.

## 2012-05-28 MED ORDER — CARBAMAZEPINE ER 400 MG PO TB12: 400.0000 mg | ORAL_TABLET | Freq: Every day | ORAL | Status: DC

## 2012-05-28 MED ORDER — ZIPRASIDONE HCL 60 MG PO CAPS: 120.0000 mg | ORAL_CAPSULE | Freq: Every day | ORAL | Status: DC

## 2012-05-28 MED ORDER — TRAZODONE HCL 50 MG PO TABS: 50.0000 mg | ORAL_TABLET | Freq: Every evening | ORAL | Status: DC | PRN

## 2012-05-28 NOTE — Progress Notes (Signed)
Psychoeducational Group Note  Date:  05/28/2012 Time:  1100  Group Topic/Focus:  Relapse Prevention Planning:   The focus of this group is to define relapse and discuss the need for planning to combat relapse.  Participation Level:  Active  Participation Quality:  Redirectable and Sharing  Affect:  Appropriate  Cognitive:  Appropriate  Insight:  Good  Engagement in Group:  Good  Additional Comments:  none  Miliano Cotten M 05/28/2012, 1:25 PM

## 2012-05-28 NOTE — [facility] Suicide Risk Assessment (Signed)
Suicide Risk Assessment  Discharge Assessment     Demographic Factors:  Adolescent or young adult, Low socioeconomic status and Unemployed  Mental Status Per Nursing Assessment::   On Admission:  NA At Time of Discharge:  Time was spent today discussing with the patient her current symptoms.  The patient reports to sleeping well last night and reports a good appetite.  She denies any significant feelings of sadness, anhedonia or depressed mood.  She denies any anxiety symptoms today and adamantly denies any suicidal and homicidal ideations. In addition, the patient denies any auditory or visual hallucinations or delusional thinking.  The patient denies any medication related side effects and states she is willing to continue these medications since she is tolerating them well.  She states she feels ready for discharge to outpatient follow up and this will be ordered per her request.  Current Mental Status Per Physician:  Diagnosis:  Axis I: Schizoaffective Disorder - Bipolar Type.   The patient was seen today and reports the following:   ADL's: Intact.  Sleep: The patient reports to sleeping well without difficulty.  Appetite: The patient reports that her appetite is good today.   Mild>(1-10) >Severe  Hopelessness (1-10): 0  Depression (1-10): 0  Anxiety (1-10): 0   Suicidal Ideation: The patient adamantly denies any current suicidal ideations.  Plan: No  Intent: No  Means: No   Homicidal Ideation: The patient adamantly denies any homicidal ideations today.  Plan: No  Intent: No.  Means: No   General Appearance/Behavior: The patient was friendly and cooperative today with this provider.  Eye Contact: Good.  Speech: Appropriate in rate and volume with no pressuring of speech noted.  Motor Behavior: wnl.  Level of Consciousness: Alert and Oriented x 3.  Mental Status: Alert and Oriented x 3.  Mood: Essentially Euthymic today.  Affect: Appears full.  Anxiety Level: No anxiety  reported today.  Thought Process: The patient denies any auditory or visual hallucinations today or delusional thinking.  Thought Content: The patient denies any auditory or visual hallucinations today or delusional thinking.  Perception: The patient denies any auditory or visual hallucinations today or delusional thinking.  Judgment: Fair.  Insight: Fair.  Cognition: Oriented to person, place and time.   Loss Factors: Financial problems/change in socioeconomic status  Historical Factors: Chronic Mental Illness.  History of Non-compliance with medications.   Risk Reduction Factors:   Sense of responsibility to family, Positive social support and Positive therapeutic relationship  Continued Clinical Symptoms:  Depression:   Anhedonia Unstable or Poor Therapeutic Relationship Previous Psychiatric Diagnoses and Treatments  Cognitive Features That Contribute To Risk:  Polarized thinking Thought constriction (tunnel vision)    Suicide Risk:  Minimal: No identifiable suicidal ideation.  Patients presenting with no risk factors but with morbid ruminations; may be classified as minimal risk based on the severity of the depressive symptoms  Discharge Diagnoses:   AXIS I:   Schizoaffective Disorder - Bipolar Type.  AXIS II:   Deferred. AXIS III:   No Acute Health Issues. AXIS IV:   Chronic Mental Illness.  History of Non-compliance with medications.  AXIS V:   GAF at time of admission approximately 35.  GAF at time of discharge approximately 50.  Current Medications:  Current Facility-Administered Medications   Medication  Dose  Route  Frequency  Provider  Last Rate  Last Dose   .  acetaminophen (TYLENOL) tablet 650 mg  650 mg  Oral  Q6H PRN  Caroleen Hamman, NP     .  alum & mag hydroxide-simeth (MAALOX/MYLANTA) 200-200-20 MG/5ML suspension 30 mL  30 mL  Oral  Q4H PRN  Caroleen Hamman, NP     .  carbamazepine (TEGRETOL XR) 12 hr tablet 400 mg  400 mg  Oral  QHS  Kyjuan Gause D Shatasha Lambing, MD       .  magnesium hydroxide (MILK OF MAGNESIA) suspension 30 mL  30 mL  Oral  Daily PRN  Caroleen Hamman, NP   30 mL at 05/22/12 0812   .  traZODone (DESYREL) tablet 50 mg  50 mg  Oral  QHS PRN  Caroleen Hamman, NP   50 mg at 05/23/12 2121   .  ziprasidone (GEODON) capsule 120 mg  120 mg  Oral  QHS  Curlene Labrum Sylver Vantassell, MD   120 mg at 05/25/12 2145   .  DISCONTD: benztropine (COGENTIN) tablet 1 mg  1 mg  Oral  BH-qamhs  Curlene Labrum Rakayla Ricklefs, MD   1 mg at 05/25/12 2145   .  DISCONTD: lithium carbonate (ESKALITH) CR tablet 450 mg  450 mg  Oral  BH-qamhs  Ronny Bacon, MD   450 mg at 05/26/12 1000    Lab Results:  Results for orders placed during the hospital encounter of 05/21/12 (from the past 48 hour(s))   TSH Status: Normal    Collection Time    05/24/12 8:12 PM   Component  Value  Range  Comment    TSH  1.281  0.350 - 4.500 uIU/mL    T4, FREE Status: Normal    Collection Time    05/24/12 8:12 PM   Component  Value  Range  Comment    Free T4  1.13  0.80 - 1.80 ng/dL    T3, FREE Status: Normal    Collection Time    05/24/12 8:12 PM   Component  Value  Range  Comment    T3, Free  2.8  2.3 - 4.2 pg/mL    LITHIUM LEVEL Status: Abnormal    Collection Time    05/25/12 8:19 PM   Component  Value  Range  Comment    Lithium Lvl  0.71 (*)  0.80 - 1.40 mEq/L     Physical Findings:  AIMS: Facial and Oral Movements  Muscles of Facial Expression: None, normal  Lips and Perioral Area: None, normal  Jaw: None, normal  Tongue: None, normal,Extremity Movements  Upper (arms, wrists, hands, fingers): None, normal  Lower (legs, knees, ankles, toes): None, normal, Trunk Movements  Neck, shoulders, hips: None, normal, Overall Severity  Severity of abnormal movements (highest score from questions above): None, normal  Incapacitation due to abnormal movements: None, normal  Patient's awareness of abnormal movements (rate only patient's report): No Awareness, Dental Status  Current problems with teeth and/or  dentures?: No  Does patient usually wear dentures?: No  CIWA: CIWA-Ar Total: 1  COWS: COWS Total Score: 1   Review of Systems:  Neurological: No headaches, seizures or dizziness reported.  G.I.: The patient denies any constipation or stomach upset today.  Musculoskeletal: The patient denies any musculoskeletal related issues.   Time was spent today discussing with the patient her current symptoms. The patient reports to sleeping well last night and reports a good appetite.  She denies any significant feelings of sadness, anhedonia or depressed mood.  She denies any anxiety symptoms today and adamantly denies any suicidal and homicidal ideations. In addition, the patient denies any auditory or visual hallucinations or delusional thinking.  The patient denies any  medication related side effects and states she is willing to continue these medications since she is tolerating them well.  She states she feels ready for discharge to outpatient follow up and this will be ordered per her request.  Treatment Plan Summary:  1. Daily contact with patient to assess and evaluate symptoms and progress in treatment.  2. Medication management  3. The patient will deny suicidal ideations or homicidal ideations for 48 hours prior to discharge and have a depression and anxiety rating of 3 or less. The patient will also deny any auditory or visual hallucinations or delusional thinking.  4. The patient will deny any symptoms of substance withdrawal at time of discharge.   Plan:  1. Will continue the patient on the medication Tegretol XR at 400 mgs po qhs for mood stabilization.  2. Will continue the patient on the medication Geodon at 120 mgs po qhs for mood stabilization and psychosis.  3. Will continue the patient on the medication Trazodone 50 mgs po qhs with a repeat - prn for sleep.  4. Laboratory Studies reviewed.  5. Will continue to monitor.  6. The patient will be discharged today to outpatient follow up  as requested.  Plan Of Care/Follow-up recommendations:  Activity:  As tolerated. Diet:  Regular Diet. Other:  Please take all medications only as directed and keep all scheduled follow up appointments.  Please return to the Emergency Room should you have any thoughts of harmning yourself or others.  Is patient on multiple antipsychotic therapies at discharge:  No   Has Patient had three or more failed trials of antipsychotic monotherapy by history:  No Recommended Plan for Multiple Antipsychotic Therapies: N/A  Ensley Blas 05/28/2012, 12:54 PM

## 2012-05-28 NOTE — Discharge Summary (Signed)
Physician Discharge Summary Note  Patient:  [name] is an 26 y.o., female MRN:  [id number] DOB:  02-Jun-1986 Patient phone:  [phone] (home)  Patient address:   [address] [place] [address]   Date of Admission:  05/21/2012 Date of Discharge:  05/28/2012  Discharge Diagnoses: Principal Problem:  *Schizoaffective disorder, bipolar type  Axis Diagnosis:  Discharge Diagnoses:  AXIS I: Schizoaffective Disorder - Bipolar Type.  AXIS II: Deferred.  AXIS III: No Acute Health Issues.  AXIS IV: Chronic Mental Illness. History of Non-compliance with medications.  AXIS V: GAF at time of admission approximately 35. GAF at time of discharge approximately 50.   Level of Care:  Inpatient Hospitalization.  Reason for Admission: [name] is a SAAF who is IVC to [facility] by her mother. She states she is unsure of what is going on in her life. Wants to talk with her mother to get understanding of why the police brought her her to the hospital. States she is not sure if she got into a fight with her mother or not. States she feels like her life has been stripped away from her--all the way down to her underclothes. States no one wants to understand her and thing can become a blurr when she is taking medications. Denies SI/HI/AH/VH/  Hospital Course:   The patient attended treatment team meeting this am and met with treatment team members. The patient's symptoms, treatment plan and response to treatment was discussed. The patient endorsed that their symptoms have improved. The patient also stated that they felt stable for discharge.  They reported that from this hospital stay they had learned many coping skills.  In other to maintain their psychiatric stability, they will continue psychiatric care on an outpatient basis. They will follow-up as outlined below.  In addition they were instructed  to take all your medications as prescribed by their mental healthcare provider and to report any  adverse effects and or reactions from your medicines to their outpatient provider promptly.  The patient is also instructed and cautioned to not engage in alcohol and or illegal drug use while on prescription medicines.  In the event of worsening symptoms the patient is instructed to call the crisis hotline, 911 and or go to the nearest ED for appropriate evaluation and treatment of symptoms.   Also while a patient in this hospital, the patient received medication management for his psychiatric symptoms. They were ordered and received as outlined below:    Medication List     As of 05/28/2012  2:46 PM    TAKE these medications      Indication    carbamazepine 400 MG 12 hr tablet   Commonly known as: TEGRETOL XR   Take 1 tablet (400 mg total) by mouth at bedtime. For mood stabilization.       traZODone 50 MG tablet   Commonly known as: DESYREL   Take 1 tablet (50 mg total) by mouth at bedtime as needed for sleep (may repeat x 1).       ziprasidone 60 MG capsule   Commonly known as: GEODON   Take 2 capsules (120 mg total) by mouth at bedtime. For psychosis and mood stabilization.        They were also enrolled in group counseling sessions and activities in which they participated actively.       Follow-up Information    Follow up with [facility]. (Walk in M-F between 8 and 9AM  for your  hospital follow up appointment.)    Contact information:   [address]  [place]  [336] [phone]         Upon discharge, patient adamantly denies suicidal, homicidal ideations, auditory, visual hallucinations and or delusional thinking. They left [facility] with all personal belongings via personal transportation in no apparent distress.  Consults:  Please see electronic medical record for details.  Significant Diagnostic Studies:  Please see electronic medical record for details.  Discharge Vitals:   Blood pressure 105/71, pulse 63, temperature 97.7 F (36.5 C), temperature source Oral, resp.  rate 14, height 5' 0.5" (1.537 m), weight 61.689 kg (136 lb)..  Mental Status Exam: Demographic Factors:  Adolescent or young adult, Low socioeconomic status and Unemployed  Mental Status Per Nursing Assessment::  On Admission: NA  At Time of Discharge: Time was spent today discussing with the patient her current symptoms. The patient reports to sleeping well last night and reports a good appetite. She denies any significant feelings of sadness, anhedonia or depressed mood. She denies any anxiety symptoms today and adamantly denies any suicidal and homicidal ideations. In addition, the patient denies any auditory or visual hallucinations or delusional thinking. The patient denies any medication related side effects and states she is willing to continue these medications since she is tolerating them well. She states she feels ready for discharge to outpatient follow up and this will be ordered per her request.  Current Mental Status Per Physician:  Diagnosis:  Axis I: Schizoaffective Disorder - Bipolar Type.  The patient was seen today and reports the following:  ADL's: Intact.  Sleep: The patient reports to sleeping well without difficulty.  Appetite: The patient reports that her appetite is good today.  Mild>(1-10) >Severe  Hopelessness (1-10): 0  Depression (1-10): 0  Anxiety (1-10): 0  Suicidal Ideation: The patient adamantly denies any current suicidal ideations.  Plan: No  Intent: No  Means: No  Homicidal Ideation: The patient adamantly denies any homicidal ideations today.  Plan: No  Intent: No.  Means: No  General Appearance/Behavior: The patient was friendly and cooperative today with this provider.  Eye Contact: Good.  Speech: Appropriate in rate and volume with no pressuring of speech noted.  Motor Behavior: wnl.  Level of Consciousness: Alert and Oriented x 3.  Mental Status: Alert and Oriented x 3.  Mood: Essentially Euthymic today.  Affect: Appears full.  Anxiety  Level: No anxiety reported today.  Thought Process: The patient denies any auditory or visual hallucinations today or delusional thinking.  Thought Content: The patient denies any auditory or visual hallucinations today or delusional thinking.  Perception: The patient denies any auditory or visual hallucinations today or delusional thinking.  Judgment: Fair.  Insight: Fair.  Cognition: Oriented to person, place and time.  Loss Factors:  Financial problems/change in socioeconomic status  Historical Factors:  Chronic Mental Illness. History of Non-compliance with medications.  Risk Reduction Factors:  Sense of responsibility to family, Positive social support and Positive therapeutic relationship  Continued Clinical Symptoms:  Depression: Anhedonia  Unstable or Poor Therapeutic Relationship  Previous Psychiatric Diagnoses and Treatments  Cognitive Features That Contribute To Risk:  Polarized thinking  Thought constriction (tunnel vision)  Suicide Risk:  Minimal: No identifiable suicidal ideation. Patients presenting with no risk factors but with morbid ruminations; may be classified as minimal risk based on the severity of the depressive symptoms  Discharge Diagnoses:  AXIS I: Schizoaffective Disorder - Bipolar Type.  AXIS II: Deferred.  AXIS  III: No Acute Health Issues.  AXIS IV: Chronic Mental Illness. History of Non-compliance with medications.  AXIS V: GAF at time of admission approximately 35. GAF at time of discharge approximately 50.  Current Medications:  Current Facility-Administered Medications   Medication  Dose  Route  Frequency  Provider  Last Rate  Last Dose   .  acetaminophen (TYLENOL) tablet 650 mg  650 mg  Oral  Q6H PRN  Caroleen Hamman, NP     .  alum & mag hydroxide-simeth (MAALOX/MYLANTA) 200-200-20 MG/5ML suspension 30 mL  30 mL  Oral  Q4H PRN  Caroleen Hamman, NP     .  carbamazepine (TEGRETOL XR) 12 hr tablet 400 mg  400 mg  Oral  QHS  Demetrius Mahler D Keyanah Kozicki, MD     .   magnesium hydroxide (MILK OF MAGNESIA) suspension 30 mL  30 mL  Oral  Daily PRN  Caroleen Hamman, NP   30 mL at 05/22/12 0812   .  traZODone (DESYREL) tablet 50 mg  50 mg  Oral  QHS PRN  Caroleen Hamman, NP   50 mg at 05/23/12 2121   .  ziprasidone (GEODON) capsule 120 mg  120 mg  Oral  QHS  Curlene Labrum Kaveon Blatz, MD   120 mg at 05/25/12 2145   .  DISCONTD: benztropine (COGENTIN) tablet 1 mg  1 mg  Oral  BH-qamhs  Curlene Labrum Kenniya Westrich, MD   1 mg at 05/25/12 2145   .  DISCONTD: lithium carbonate (ESKALITH) CR tablet 450 mg  450 mg  Oral  BH-qamhs  Ronny Bacon, MD   450 mg at 05/26/12 1000   Lab Results:  Results for orders placed during the hospital encounter of 05/21/12 (from the past 48 hour(s))   TSH Status: Normal    Collection Time    05/24/12 8:12 PM   Component  Value  Range  Comment    TSH  1.281  0.350 - 4.500 uIU/mL    T4, FREE Status: Normal    Collection Time    05/24/12 8:12 PM   Component  Value  Range  Comment    Free T4  1.13  0.80 - 1.80 ng/dL    T3, FREE Status: Normal    Collection Time    05/24/12 8:12 PM   Component  Value  Range  Comment    T3, Free  2.8  2.3 - 4.2 pg/mL    LITHIUM LEVEL Status: Abnormal    Collection Time    05/25/12 8:19 PM   Component  Value  Range  Comment    Lithium Lvl  0.71 (*)  0.80 - 1.40 mEq/L    Physical Findings:  AIMS: Facial and Oral Movements  Muscles of Facial Expression: None, normal  Lips and Perioral Area: None, normal  Jaw: None, normal  Tongue: None, normal,Extremity Movements  Upper (arms, wrists, hands, fingers): None, normal  Lower (legs, knees, ankles, toes): None, normal, Trunk Movements  Neck, shoulders, hips: None, normal, Overall Severity  Severity of abnormal movements (highest score from questions above): None, normal  Incapacitation due to abnormal movements: None, normal  Patient's awareness of abnormal movements (rate only patient's report): No Awareness, Dental Status  Current problems with teeth and/or dentures?: No    Does patient usually wear dentures?: No  CIWA: CIWA-Ar Total: 1  COWS: COWS Total Score: 1  Review of Systems:  Neurological: No headaches, seizures or dizziness reported.  G.I.: The patient denies any constipation or stomach upset  today.  Musculoskeletal: The patient denies any musculoskeletal related issues.  Time was spent today discussing with the patient her current symptoms. The patient reports to sleeping well last night and reports a good appetite. She denies any significant feelings of sadness, anhedonia or depressed mood. She denies any anxiety symptoms today and adamantly denies any suicidal and homicidal ideations. In addition, the patient denies any auditory or visual hallucinations or delusional thinking. The patient denies any medication related side effects and states she is willing to continue these medications since she is tolerating them well. She states she feels ready for discharge to outpatient follow up and this will be ordered per her request.  Treatment Plan Summary:  1. Daily contact with patient to assess and evaluate symptoms and progress in treatment.  2. Medication management  3. The patient will deny suicidal ideations or homicidal ideations for 48 hours prior to discharge and have a depression and anxiety rating of 3 or less. The patient will also deny any auditory or visual hallucinations or delusional thinking.  4. The patient will deny any symptoms of substance withdrawal at time of discharge.  Plan:  1. Will continue the patient on the medication Tegretol XR at 400 mgs po qhs for mood stabilization.  2. Will continue the patient on the medication Geodon at 120 mgs po qhs for mood stabilization and psychosis.  3. Will continue the patient on the medication Trazodone 50 mgs po qhs with a repeat - prn for sleep.  4. Laboratory Studies reviewed.  5. Will continue to monitor.  6. The patient will be discharged today to outpatient follow up as requested.  Plan Of  Care/Follow-up recommendations:  Activity: As tolerated.  Diet: Regular Diet.  Other: Please take all medications only as directed and keep all scheduled follow up appointments. Please return to the Emergency Room should you have any thoughts of harmning yourself or others.   Is patient on multiple antipsychotic therapies at discharge: No  Has Patient had three or more failed trials of antipsychotic monotherapy by history: No  Recommended Plan for Multiple Antipsychotic Therapies: N/A  Discharge destination:  Home  Discharge Orders    Future Orders Please Complete By Expires   Diet - low sodium heart healthy      Increase activity slowly      Discharge instructions      Comments:   Please take all medications only as directed and keep all scheduled follow up appointments.  Please return to your local Emergency Room should you have any thoughts of harming yourself or others.       Medication List     As of 05/28/2012  2:46 PM    TAKE these medications      Indication    carbamazepine 400 MG 12 hr tablet   Commonly known as: TEGRETOL XR   Take 1 tablet (400 mg total) by mouth at bedtime. For mood stabilization.       traZODone 50 MG tablet   Commonly known as: DESYREL   Take 1 tablet (50 mg total) by mouth at bedtime as needed for sleep (may repeat x 1).       ziprasidone 60 MG capsule   Commonly known as: GEODON   Take 2 capsules (120 mg total) by mouth at bedtime. For psychosis and mood stabilization.            Follow-up Information    Follow up with [facility]. (Walk in M-F between 8 and 9AM  for  your hospital follow up appointment.)    Contact information:   [address]  [place]  [336] [phone]        Follow-up recommendations:   Activities: Resume typical activities Diet: Resume typical diet Other: Follow up with outpatient provider and report any side effects to out patient prescriber.  Comments:  Take all your medications as prescribed by your mental  healthcare provider. Report any adverse effects and or reactions from your medicines to your outpatient provider promptly. Patient is instructed and cautioned to not engage in alcohol and or illegal drug use while on prescription medicines. In the event of worsening symptoms, patient is instructed to call the crisis hotline, 911 and or go to the nearest ED for appropriate evaluation and treatment of symptoms. Follow-up with your primary care provider for your other medical issues, concerns and or health care needs.  Signed: Franchot Gallo 05/28/2012 2:46 PM

## 2012-05-28 NOTE — Treatment Plan (Signed)
Interdisciplinary Treatment Plan Update (Adult)  Date: 05/28/2012 Time Reviewed: 10:05 AM  Progress in Treatment:  Attending groups: Yes  Participating in groups: Yes  Taking medication as prescribed: yes  Tolerating medication: Yes  Family/Significant othe contact made: Yes, mother Patient understands diagnosis: Yes  Discussing patient identified problems/goals with staff: Yes  Medical problems stabilized or resolved: Yes  Denies suicidal/homicidal ideation: Yes  Issues/concerns per patient self-inventory: None identified  Other: N/A  New problem(s) identified: none.  Reason for Continuation of Hospitalization:  Discharge today 10/4 Interventions implemented related to continuation of hospitalization: mood stabilization, medication monitoring and adjustment, group therapy and psycho education, safety checks q 15 mins  Additional comments: N/A  Estimated length of stay: d/c today Discharge Plan: F/U scheduled with [facility] walk-in clinic  1. Goal(s): Reduce depressive symptoms  Met: Yes  Target date: 10/2  As evidenced by: Reducing depression from a 10 to a 0 as reported by pt in tx team. 2. Goal (s): Eliminate Suicidal Ideation  Met: Yes  Target date: 10/2 As evidenced by: Eliminate suicidal ideation.  3. Goal(s): Reduce Psychosis  Met: Yes  Target date: 10/2 As evidenced by: Reduce psychotic symptoms to baseline, as reported by pt.  Attendees:  Patient: [name]  05/28/2012 11:38 AM   Family:    Physician: Franchot Gallo, MD  05/28/2012 11:39 AM  Nursing:    Case Manager: Estevan Ryder  05/28/2012 11:39AM  Counselor: Veto Kemps, MT-BC    Other: Trula Slade, MSW Intern 05/28/2012 11:39AM  Other:    Other:    Other:    Scribe for Treatment Team:   Trula Slade, MSW Intern

## 2012-05-28 NOTE — Progress Notes (Signed)
[facility] [place] [facility] Case Management Discharge Plan:  Will you be returning to the same living situation after discharge: Yes,  lives with mother At discharge, do you have transportation home?:Yes,  mother  Do you have the ability to pay for your medications:Yes,  mental health :     Release of information consent forms completed and in the chart;  Patient's signature needed at discharge.  Patient to Follow up at:  Follow-up Information    Follow up with [facility]. (Walk in M-F between 8 and 9AM  for your hospital follow up appointment.)    Contact information:   [address]  [place]  [[phone]         Patient denies SI/HI:   Yes,  yes    Safety Planning and Suicide Prevention discussed:  Yes,  yes  Barrier to discharge identified:No.  Summary and Recommendations:   [name], [name] 05/28/2012, 11:34 AM

## 2012-05-28 NOTE — Progress Notes (Signed)
Pt d/c from hospital with her mother. All items returned. D/C instructions given, samples given,  and prescriptions given. Pt denies si and hi.

## 2012-05-31 NOTE — Progress Notes (Signed)
Patient Discharge Instructions:  After Visit Summary (AVS):   Faxed to:  05/31/2012 Discharge Summary Note:   Faxed to:  05/31/2012 Suicide Risk Assessment - Discharge Assessment:   Faxed to:  05/31/2012 Faxed/Sent to the Next Level Care provider:  05/31/2012  [facility] faxed to [phone]  Karleen Hampshire Brittini, 05/31/2012, 3:31 PM

## 2012-06-07 ENCOUNTER — Encounter (HOSPITAL_COMMUNITY): Payer: Self-pay | Admitting: *Deleted

## 2012-06-07 ENCOUNTER — Other Ambulatory Visit: Payer: Self-pay

## 2012-06-07 DIAGNOSIS — R002 Palpitations: Secondary | ICD-10-CM | POA: Insufficient documentation

## 2012-06-07 NOTE — ED Notes (Addendum)
Pt states 2 episodes of palpations that lasted for 1-2 min. Each pt states first time that they happened. Pt states slight squeezing in chest when the palpations happened. Pt acting paranoid. Pt asking for me to read my triage note out loud. Pt asked if she took any medications or drugs and pt response was she did not get a chance to take her Geodon this evening.

## 2012-06-08 ENCOUNTER — Emergency Department (HOSPITAL_COMMUNITY)
Admission: EM | Admit: 2012-06-08 | Discharge: 2012-06-08 | Disposition: A | Discharge status: Home / Self Care | Payer: BC Managed Care – PPO | Attending: Emergency Medicine | Admitting: Emergency Medicine

## 2012-06-08 NOTE — ED Notes (Signed)
The pt wants to leave.  She states she is getting agitated and she will return tomorrow

## 2012-06-12 ENCOUNTER — Encounter (HOSPITAL_COMMUNITY): Payer: Self-pay | Admitting: Emergency Medicine

## 2012-06-12 ENCOUNTER — Emergency Department (HOSPITAL_COMMUNITY)
Admission: EM | Admit: 2012-06-12 | Discharge: 2012-06-13 | Disposition: A | Discharge status: Home / Self Care | Payer: BC Managed Care – PPO | Attending: Emergency Medicine | Admitting: Emergency Medicine

## 2012-06-12 DIAGNOSIS — F22 Delusional disorders: Secondary | ICD-10-CM

## 2012-06-12 DIAGNOSIS — F319 Bipolar disorder, unspecified: Secondary | ICD-10-CM | POA: Insufficient documentation

## 2012-06-12 DIAGNOSIS — Z9119 Patient's noncompliance with other medical treatment and regimen: Secondary | ICD-10-CM

## 2012-06-12 DIAGNOSIS — Z888 Allergy status to other drugs, medicaments and biological substances status: Secondary | ICD-10-CM | POA: Insufficient documentation

## 2012-06-12 LAB — COMPREHENSIVE METABOLIC PANEL
ALT: 11 U/L (ref 0–35)
AST: 17 U/L (ref 0–37)
Albumin: 3.9 g/dL (ref 3.5–5.2)
CO2: 28 mEq/L (ref 19–32)
Calcium: 9.3 mg/dL (ref 8.4–10.5)
GFR calc non Af Amer: >90 mL/min (ref >90)
Sodium: 136 mEq/L (ref 135–145)

## 2012-06-12 LAB — URINALYSIS, ROUTINE W REFLEX MICROSCOPIC
Leukocytes, UA: NEGATIVE
Protein, ur: NEGATIVE mg/dL
Urobilinogen, UA: 0.2 mg/dL (ref 0.0–1.0)

## 2012-06-12 LAB — POCT PREGNANCY, URINE: Preg Test, Ur: NEGATIVE

## 2012-06-12 LAB — CBC WITH DIFFERENTIAL/PLATELET
Basophils Absolute: 0.1 10*3/uL (ref 0.0–0.1)
Basophils Relative: 2 % — ABNORMAL HIGH (ref 0–1)
Eosinophils Relative: 7 % — ABNORMAL HIGH (ref 0–5)
Lymphocytes Relative: 39 % (ref 12–46)
MCV: 79.6 fL (ref 78.0–100.0)
Neutro Abs: 1.8 10*3/uL (ref 1.7–7.7)
Platelets: 309 10*3/uL (ref 150–400)
RDW: 13.6 % (ref 11.5–15.5)
WBC: 4.2 10*3/uL (ref 4.0–10.5)

## 2012-06-12 LAB — RAPID URINE DRUG SCREEN, HOSP PERFORMED
Amphetamines: NOT DETECTED
Benzodiazepines: NOT DETECTED
Cocaine: NOT DETECTED
Opiates: NOT DETECTED

## 2012-06-12 LAB — SALICYLATE LEVEL: Salicylate Lvl: <2 mg/dL — ABNORMAL LOW (ref 2.8–20.0)

## 2012-06-12 MED ORDER — LORAZEPAM 1 MG PO TABS
1.0000 mg | ORAL_TABLET | Freq: Three times a day (TID) | ORAL | Status: DC | PRN
  Administered 2012-06-12: 1 mg via ORAL
  Filled 2012-06-12: qty 1

## 2012-06-12 MED ORDER — ONDANSETRON HCL 4 MG PO TABS: 4.0000 mg | ORAL_TABLET | Freq: Three times a day (TID) | ORAL | Status: DC | PRN

## 2012-06-12 MED ORDER — ZIPRASIDONE HCL 20 MG PO CAPS
120.0000 mg | ORAL_CAPSULE | Freq: Every day | ORAL | Status: DC
  Administered 2012-06-12: 60 mg via ORAL
  Filled 2012-06-12: qty 3

## 2012-06-12 MED ORDER — ZIPRASIDONE HCL 20 MG PO CAPS: 60.0000 mg | ORAL_CAPSULE | Freq: Two times a day (BID) | ORAL | Status: DC

## 2012-06-12 NOTE — ED Notes (Signed)
Up to the bathroom 

## 2012-06-12 NOTE — ED Notes (Signed)
Snack given.

## 2012-06-12 NOTE — ED Notes (Signed)
Security has wanded pt and belongings.   

## 2012-06-12 NOTE — ED Notes (Signed)
Sitting quietly in room

## 2012-06-12 NOTE — ED Notes (Signed)
Pt here custody of [employer] under IVC. Mother states pt has not taken meds in several days. Took pt to [facility] yesterday and she spoke w/ staff, scheduled appt for therapy next week, and told them she would take her meds last noc. Pt has become "unruly, belligerent, defiant, and argumentative" according to mom since she stopped taking her meds.

## 2012-06-12 NOTE — ED Notes (Signed)
telepsych in progress 

## 2012-06-12 NOTE — ED Notes (Signed)
Pt aggitated off/on, paranoid.  Will not drink the coke because it is not in a can, won;t eat lunch because she does not know where it came from and did not order it, would not take ativan w.o examining the package.  Reasurred, that she is safe. Sandwich and sealed milk given.

## 2012-06-12 NOTE — ED Notes (Signed)
Up to the desk, talkative, wanting to go home, wanting answers to her questions..why she is here...why does she have to take medications...why can't she leave... Pt is aware that she is waiting to talk to the psychatrist

## 2012-06-12 NOTE — ED Provider Notes (Addendum)
History     CSN: [id number]  Arrival date & time 06/12/12  4098   First MD Initiated Contact with Patient 06/12/12 [phone]      Chief Complaint  Patient presents with  . Medical Clearance    (Consider location/radiation/quality/duration/timing/severity/associated sxs/prior treatment) HPI Comments: [name] presents ambulatory with her mother for evaluation. She has a history of bipolar disorder and per report has stopped taking her medications. Her mother states that she has become "only, and defiant, and argumentative". [name] states that she has been taking the medication on a when necessary basis as she was told to do so last time she saw a psychiatrist. She reports that her mother has to take her blood pressure and glaucoma medication every day but she does not need to take her antipsychotic daily. She feels as if she is being pulled in multiple directions and thinks that everyone is trying to manipulate her.  The history is provided by the patient and a parent. No language interpreter was used.    Past Medical History  Diagnosis Date  . Bipolar 1 disorder     History reviewed. No pertinent past surgical history.  No family history on file.  History  Substance Use Topics  . Smoking status: Never Smoker   . Smokeless tobacco: Not on file  . Alcohol Use: Yes     rarely    OB History    Grav Para Term Preterm Abortions TAB SAB Ect Mult Living                  Review of Systems  Unable to perform ROS: Psychiatric disorder    Allergies  Quetiapine  Home Medications   Current Outpatient Rx  Name Route Sig Dispense Refill  . ZIPRASIDONE HCL 60 MG PO CAPS Oral Take 60 mg by mouth at bedtime. For psychosis and mood stabilization. Pt is supposed to be taking 120 mg daily. 60 mg twice daily      There were no vitals taken for this visit.  Physical Exam  Nursing note and vitals reviewed. Constitutional: She is oriented to person, place, and time. She appears  well-developed and well-nourished. No distress.  HENT:  Head: Normocephalic and atraumatic.  Right Ear: External ear normal.  Left Ear: External ear normal.  Nose: Nose normal.  Mouth/Throat: Oropharynx is clear and moist.  Eyes: Conjunctivae normal are normal. Pupils are equal, round, and reactive to light. Right eye exhibits no discharge. Left eye exhibits no discharge. No scleral icterus.  Neck: Normal range of motion. No JVD present. No tracheal deviation present.  Cardiovascular: Normal rate, regular rhythm, normal heart sounds and intact distal pulses.  Exam reveals no gallop and no friction rub.   No murmur heard. Pulmonary/Chest: Effort normal and breath sounds normal. No stridor. No respiratory distress. She has no wheezes. She has no rales. She exhibits no tenderness.  Abdominal: Soft. Bowel sounds are normal. There is no tenderness.  Musculoskeletal: Normal range of motion. She exhibits no edema and no tenderness.  Neurological: She is alert and oriented to person, place, and time. Coordination (note a nl, steady, confident gait) normal.  Skin: Skin is warm and dry. No rash noted. She is not diaphoretic. No erythema. No pallor.  Psychiatric: Her mood appears anxious. Her affect is angry and labile. Her speech is rapid and/or pressured and tangential. Her speech is not delayed and not slurred. She is agitated and aggressive. She is is not hyperactive, not slowed, not withdrawn and  not actively hallucinating. Thought content is paranoid. She expresses impulsivity. She is communicative. She is attentive.    ED Course  Procedures (including critical care time)   Labs Reviewed  URINALYSIS, ROUTINE W REFLEX MICROSCOPIC  CBC WITH DIFFERENTIAL  COMPREHENSIVE METABOLIC PANEL  ETHANOL  ACETAMINOPHEN LEVEL  SALICYLATE LEVEL  URINE RAPID DRUG SCREEN (HOSP PERFORMED)   No results found.   No diagnosis found.    MDM  Pt appears stable, NAD.  Note a child-like affect with  disorganized thoughts.  Per report she has not been taking her psychiatric medications as prescribed.  Pt appears paranoid and is easily agitated.  Plan basic psych clearance labs, ACT Team eval an telepsych evaluation.  Will review results and recommendations as available and provide the appropriate disposition.  1045.  Note essentially nl CBC, CMP, u-tox, and urinalysis.  Consults placed to ACT Team and Telepsych provider.  Awaiting recommendations.   Monique Chad, MD 06/12/12 1047  1443.  Pt has been evaluated by [name] (telepsych provider).  He recommends inpt psych hospitalization.  ACT Team has been consulted to assist with placement.  Monique Chad, MD 06/12/12 1444

## 2012-06-12 NOTE — ED Notes (Signed)
Pt took 15 minutes to get in paper scrubs, argued with staff regarding each detail. Dr. Lorenso Courier now at bedside talking with pt.

## 2012-06-12 NOTE — ED Notes (Signed)
ACT in w/ pt 

## 2012-06-12 NOTE — BH Assessment (Signed)
Assessment Note   [name] is an 26 y.o. female who presented to [facility] Emergency Department with her mother due to the c/o of not complying with psychotropic medication regimen. Patient has a noted history of Bipolar Disorder and reported to clinician that she does not understand why she is currently in the ED. Patient verbalized to Clinical research associate that she presumed that things were going well within her life until her mother transported her to the ED for psychiatric evaluation. During assessment patient was observed to exhibit pressured speech in addition to tangential thinking. Patient reported to clinician that she desires to "co-exist with others in life and LIVE IN [place]!". Patient was observed at times to be responding to internal stimuli, evidenced by sporadic occurrences of low-volumed speech and staring off into space. Patient was observed to exhibit a suspicious affect, verbalizing paranoia and attempts of others to potentially harm her. Patient reported to clinician that she desires to get her life back and leave the hospital. Patient currently denies SI/HI/AVH at this time. Specialist On Call has evaluated patient and recommends inpatient treatment at this time.  Axis I: Bipolar, mixed Axis II: Deferred Axis III:  Past Medical History  Diagnosis Date  . Bipolar 1 disorder    Axis IV: other psychosocial or environmental problems, problems related to social environment and problems with primary support group Axis V: 31-40 impairment in reality testing  Past Medical History:  Past Medical History  Diagnosis Date  . Bipolar 1 disorder     History reviewed. No pertinent past surgical history.  Family History: No family history on file.  Social History:  reports that she has never smoked. She does not have any smokeless tobacco history on file. She reports that she drinks alcohol. She reports that she does not use illicit drugs.  Additional Social History:  Alcohol / Drug Use Pain  Medications: See MAR Prescriptions: See MAR Over the Counter: See MAR History of alcohol / drug use?: No history of alcohol / drug abuse  CIWA: CIWA-Ar BP: 114/80 mmHg Pulse Rate: 79  COWS:    Allergies:  Allergies  Allergen Reactions  . Quetiapine Other (See Comments)    Patient did not say specific reaction just has a "reaction"     Home Medications:  (Not in a hospital admission)  OB/GYN Status:  No LMP recorded.  General Assessment Data Location of Assessment: [facility] ED Living Arrangements: Parent Can pt return to current living arrangement?: Yes Admission Status: Involuntary Is patient capable of signing voluntary admission?: No Transfer from: Acute Hospital Referral Source: Self/Family/Friend  Education Status Is patient currently in school?: No  Risk to self Suicidal Ideation: No-Not Currently/Within Last 6 Months Suicidal Intent: No-Not Currently/Within Last 6 Months Is patient at risk for suicide?: No Suicidal Plan?: No-Not Currently/Within Last 6 Months Access to Means: No What has been your use of drugs/alcohol within the last 12 months?: Pt denies SA Previous Attempts/Gestures: No How many times?: 0  Other Self Harm Risks: None known Triggers for Past Attempts: None known Intentional Self Injurious Behavior: None Family Suicide History: No Recent stressful life event(s): Conflict (Comment) (Relational stressors with mother) Persecutory voices/beliefs?: No Depression: Yes Depression Symptoms: Despondent;Isolating;Fatigue;Feeling worthless/self pity;Loss of interest in usual pleasures;Feeling angry/irritable Substance abuse history and/or treatment for substance abuse?: No Suicide prevention information given to non-admitted patients: Not applicable  Risk to Others Homicidal Ideation: No Thoughts of Harm to Others: No Current Homicidal Intent: No Current Homicidal Plan: No Access to Homicidal Means:  No Identified Victim: None Reported History of harm  to others?: No Assessment of Violence: None Noted Violent Behavior Description: Pt is calm and cooperative Does 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 to weapons?: No Criminal Charges Pending?: No Does patient have a court date: No  Psychosis Hallucinations:  (Unknown. Pt observe to exhibit response to internal stimuli) Delusions: Unspecified (Pt exhibits paranoia. Believes others are trying to harm her)  Mental Status Report Appear/Hygiene: Other (Comment) (appropriate) Eye Contact: Fair Motor Activity: Freedom of movement Speech: Pressured;Tangential Level of Consciousness: Alert Mood: Depressed;Apprehensive;Helpless Affect: Depressed Anxiety Level: Minimal Thought Processes: Tangential;Flight of Ideas Judgement: Impaired Orientation: Person;Place;Time Obsessive Compulsive Thoughts/Behaviors: None  Cognitive Functioning Concentration: Decreased Memory: Recent Intact;Remote Intact IQ: Average Insight: Poor Impulse Control: Poor Appetite: Fair Weight Loss: 0  Weight Gain: 0  Sleep: No Change Total Hours of Sleep: 9  Vegetative Symptoms: None  ADLScreening [facility] Assessment Services) Patient's cognitive ability adequate to safely complete daily activities?: Yes Patient able to express need for assistance with ADLs?: Yes Independently performs ADLs?: Yes (appropriate for developmental age)  Abuse/Neglect [facility]) Physical Abuse: Denies Verbal Abuse: Denies Sexual Abuse: Denies  Prior Inpatient Therapy Prior Inpatient Therapy: Yes Prior Therapy Dates: 2013 Prior Therapy Facilty/Provider(s): [facility] Reason for Treatment: Psychosis  Prior Outpatient Therapy Prior Outpatient Therapy: No (reports change in her rx's although she claims no outpt tx) Prior Therapy Dates: N/A Prior Therapy Facilty/Provider(s): N/A Reason for Treatment: N/A  ADL Screening (condition at time of admission) Patient's cognitive ability adequate to safely complete daily activities?: Yes Patient able to express need  for assistance with ADLs?: Yes Independently performs ADLs?: Yes (appropriate for developmental age) Weakness of Legs: None Weakness of Arms/Hands: None  Home Assistive Devices/Equipment Home Assistive Devices/Equipment: None  Therapy Consults (therapy consults require a physician order) PT Evaluation Needed: No OT Evalulation Needed: No SLP Evaluation Needed: No Abuse/Neglect Assessment (Assessment to be complete while patient is alone) Physical Abuse: Denies Verbal Abuse: Denies Sexual Abuse: Denies Exploitation of patient/patient's resources: Denies Values / Beliefs Cultural Requests During Hospitalization: None Spiritual Requests During Hospitalization: None Consults Spiritual Care Consult Needed: No Social Work Consult Needed: No      Additional Information 1:1 In Past 12 Months?: No CIRT Risk: No Elopement Risk: No Does patient have medical clearance?: Yes     Disposition: Inpatient Treatment for stabilization.  Disposition Disposition of Patient: Inpatient treatment program Type of inpatient treatment program: Adult  On Site Evaluation by:  Self Reviewed with Physician:     Janann Colonel C 06/12/2012 3:55 PM

## 2012-06-12 NOTE — ED Notes (Signed)
telepsch pending

## 2012-06-12 NOTE — ED Notes (Signed)
Pt up to the desk asking about how long she has to stay, when she can go.  Pt seemed to understand that the psychatrist/MD must release her and that there is not a specific date.

## 2012-06-13 ENCOUNTER — Inpatient Hospital Stay (HOSPITAL_COMMUNITY)
Admission: RE | Admit: 2012-06-13 | Discharge: 2012-06-24 | DRG: 885 | Disposition: A | Discharge status: Home / Self Care | Payer: Federal, State, Local not specified - Other | Source: Other Acute Inpatient Hospital | Attending: Psychiatry | Admitting: Psychiatry

## 2012-06-13 ENCOUNTER — Encounter (HOSPITAL_COMMUNITY): Payer: Self-pay | Admitting: Emergency Medicine

## 2012-06-13 DIAGNOSIS — F259 Schizoaffective disorder, unspecified: Principal | ICD-10-CM | POA: Diagnosis present

## 2012-06-13 DIAGNOSIS — Z9119 Patient's noncompliance with other medical treatment and regimen: Secondary | ICD-10-CM

## 2012-06-13 DIAGNOSIS — F250 Schizoaffective disorder, bipolar type: Secondary | ICD-10-CM | POA: Diagnosis present

## 2012-06-13 DIAGNOSIS — Z91199 Patient's noncompliance with other medical treatment and regimen due to unspecified reason: Secondary | ICD-10-CM

## 2012-06-13 MED ORDER — MAGNESIUM HYDROXIDE 400 MG/5ML PO SUSP: 30.0000 mL | Freq: Every day | ORAL | Status: DC | PRN

## 2012-06-13 MED ORDER — ALUM & MAG HYDROXIDE-SIMETH 200-200-20 MG/5ML PO SUSP: 30.0000 mL | ORAL | Status: DC | PRN

## 2012-06-13 MED ORDER — CARBAMAZEPINE ER 200 MG PO TB12
200.0000 mg | ORAL_TABLET | Freq: Two times a day (BID) | ORAL | Status: DC
  Administered 2012-06-14 – 2012-06-24 (×21): 200 mg via ORAL
  Filled 2012-06-13 (×25): qty 1

## 2012-06-13 MED ORDER — ZIPRASIDONE HCL 60 MG PO CAPS
60.0000 mg | ORAL_CAPSULE | Freq: Two times a day (BID) | ORAL | Status: DC
  Administered 2012-06-13 – 2012-06-15 (×6): 60 mg via ORAL
  Filled 2012-06-13 (×11): qty 1

## 2012-06-13 MED ORDER — ACETAMINOPHEN 325 MG PO TABS: 650.0000 mg | ORAL_TABLET | Freq: Four times a day (QID) | ORAL | Status: DC | PRN

## 2012-06-13 NOTE — ED Notes (Signed)
Patient is asleep , repositioning self in bed.

## 2012-06-13 NOTE — H&P (Signed)
Psychiatric Admission Assessment Adult  Patient Identification:  [name]  16 SAAF  Date of Evaluation:  06/13/2012 Chief Complaint:  Schizoaffective disorder Bipolar type   HIstory of Present Illness: Just discharged October 4th and is noncompliant with meds again. Due to becoming increasingly aggressive towards her mother and lashing out without provocation mother once again took out IVC. In ED was noted to have paranoid ideation pressured speech was disorganized and labile. Difficult to interview today as she is responding to internal stimuli.  Continues to state that no one asks for her side  And she doesn't atke the meds due to side effects. Wants her own life.   Mood Symptoms:  Mood Swings, Depression Symptoms:  depressed mood, feelings of worthlessness/guilt, difficulty concentrating, impaired memory, (Hypo) Manic Symptoms:  Distractibility, Irritable Mood, Labiality of Mood, Anxiety Symptoms:  Excessive Worry, Psychotic Symptoms:  Hallucinations: Auditory Paranoia,  PTSD Symptoms: None   Past Psychiatric History: Diagnosis:Schizoaffective DO -Bipolar type  Hospitalizations:most recent 9/27-104 has had several this year   Outpatient Care:[facility] Dr. Ladona Ridgel   Substance Abuse Care:None   Self-Mutilation:None   Suicidal Attempts:None   Violent Behaviors:is aggressive towards her mother   Past Medical History:   Past Medical History  Diagnosis Date  . Bipolar 1 disorder    None. Allergies:   Allergies  Allergen Reactions  . Quetiapine Other (See Comments)    Patient did not say specific reaction just has a "reaction"    PTA Medications: Prescriptions prior to admission  Medication Sig Dispense Refill  . ziprasidone (GEODON) 60 MG capsule Take 60 mg by mouth at bedtime. For psychosis and mood stabilization. Pt is supposed to be taking 120 mg daily. 60 mg twice daily        Previous Psychotropic Medications:  Medication/Dose  10/4 Tegretol 400 mg at hs               Geodon  120 mg at hs            Trazadone 50 mg at hs              Social History: Current Place of Residence:   Place of Birth:   Family Members: Marital Status:  Single Children:  Sons:  Daughters: Relationships: Education:  Automotive engineer -some  Educational Problems/Performance: Religious Beliefs/Practices: History of Abuse (Emotional/Phsycial/Sexual) Occupational Experiences; Military History:  None. Legal History: Hobbies/Interests:  Family History:  History reviewed. No pertinent family history.  Mental Status Examination/Evaluation: Objective:  Appearance: Disheveled  Eye Contact::  Fair  Speech:  Clear and Coherent and Normal Rate  Volume:  Normal  Mood:  Anxious and Depressed  Affect:  Congruent  Thought Process:  Disorganized  Orientation:  Other:  knows where she is   Thought Content:  Hallucinations: Auditory and Paranoid Ideation  Suicidal Thoughts:  No  Homicidal Thoughts:  No  Memory:  Immediate;   Fair  Judgement:  Poor  Insight:  Lacking  Psychomotor Activity:  Normal  Concentration:  Poor  Recall:  Poor  Akathisia:  No  Handed:  Right  AIMS (if indicated):     Assets:  Physical Health Resilience Social Support  Sleep:  Number of Hours: 0     Laboratory/X-Ray Psychological Evaluation(s)      Assessment:    AXIS I:  Schizoaffective Disorder AXIS II:  Deferred AXIS III:   Past Medical History  Diagnosis Date  . Bipolar 1 disorder    AXIS IV:  Chronic non-compliance causing exacerbation in symptoms  AXIS V:  31-40 impairment in reality testing  Treatment Plan/Recommendations:  Treatment Plan Summary: Daily contact with patient to assess and evaluate symptoms and progress in treatment Medication management Current Medications:  Current Facility-Administered Medications  Medication Dose Route Frequency Provider Last Rate Last Dose  . acetaminophen (TYLENOL) tablet 650 mg  650 mg Oral Q6H PRN Verne Spurr, PA-C      .  alum & mag hydroxide-simeth (MAALOX/MYLANTA) 200-200-20 MG/5ML suspension 30 mL  30 mL Oral Q4H PRN Verne Spurr, PA-C      . magnesium hydroxide (MILK OF MAGNESIA) suspension 30 mL  30 mL Oral Daily PRN Verne Spurr, PA-C      . ziprasidone (GEODON) capsule 60 mg  60 mg Oral BID WC Verne Spurr, PA-C   60 mg at 06/13/12 0436   Facility-Administered Medications Ordered in Other Encounters  Medication Dose Route Frequency Provider Last Rate Last Dose  . DISCONTD: LORazepam (ATIVAN) tablet 1 mg  1 mg Oral Q8H PRN Tobin Chad, MD   1 mg at 06/12/12 1318  . DISCONTD: ondansetron (ZOFRAN) tablet 4 mg  4 mg Oral Q8H PRN Tobin Chad, MD      . DISCONTD: ziprasidone (GEODON) capsule 120 mg  120 mg Oral QHS Tobin Chad, MD   60 mg at 06/12/12 2155  . DISCONTD: ziprasidone (GEODON) capsule 60 mg  60 mg Oral BID WC Nathan R. Rubin Payor, MD        Observation Level/Precautions:  15 min checks   Laboratory:    Psychotherapy:    Medications:    Routine PRN Medications:  Yes  Consultations:    Discharge Concerns:  Needs to get on ACT team and start IM meds   Other:  Agree with H&P from [facility]   [name],MICKIE D. 10/20/201310:38 AM

## 2012-06-13 NOTE — Progress Notes (Signed)
Patient ID: [name], female   DOB: [birth year], 26 y.o.   MRN: [id number]  Patient is a 26 year old female admitted to [facility] involuntarily for psychosis. Patient unable to recall why she was brought to hospital, unable to stay on topic and continually responding to internal stimuli mouthing words and arguing with herself. Patient paranoid and reluctant to allow staff to search her; pt needing much redirection during admission process and seemingly confused. According to report and IVC paperwork, patient not taking medications at home and becoming increasingly agitated, leading to her mother bringing her into the hospital. Pt with history of multiple admissions to [facility]. No distress noted during assessment. Patient given Geodon 60mg  po now as ordered by PA. Pt monitored Q 15 minutes for safety.

## 2012-06-13 NOTE — Progress Notes (Signed)
Patient ID: [name], female   DOB: July 11, 1986, 26 y.o.   MRN: [id number] Patient with BP 86/55, pa aware. Pt given cup of Gatorade to drink prior to lying down.

## 2012-06-13 NOTE — Progress Notes (Signed)
Psychoeducational Group Note  Date:  06/13/2012 Time:  0945 am  Group Topic/Focus:  Making Healthy Choices:   The focus of this group is to help patients identify negative/unhealthy choices they were using prior to admission and identify positive/healthier coping strategies to replace them upon discharge.  Participation Level:  Did Not Attend   Tameyah Koch J 06/13/2012, 10:29 AM  

## 2012-06-13 NOTE — B[facility] Suicide Risk Assessment (Signed)
Suicide Risk Assessment  Admission Assessment     Nursing information obtained from:  Patient Demographic factors:  Low socioeconomic status;Unemployed Current Mental Status:  NA Loss Factors:  NA Historical Factors:  Impulsivity Risk Reduction Factors:  Living with another person, especially a relative  CLINICAL FACTORS:   Severe Anxiety and/or Agitation Bipolar Disorder:   Mixed State Currently Psychotic Unstable or Poor Therapeutic Relationship Previous Psychiatric Diagnoses and Treatments  COGNITIVE FEATURES THAT CONTRIBUTE TO RISK:  Closed-mindedness Loss of executive function Polarized thinking    SUICIDE RISK:   Mild:  Suicidal ideation of limited frequency, intensity, duration, and specificity.  There are no identifiable plans, no associated intent, mild dysphoria and related symptoms, good self-control (both objective and subjective assessment), few other risk factors, and identifiable protective factors, including available and accessible social support.  PLAN OF CARE: Admitted on IVC for safety, crisis stabilization and medication management. Patient has been non compliant with her medication and presented with psychosis and mania.   Kendel Pesnell,JANARDHAHA R. 06/13/2012, 9:59 AM

## 2012-06-13 NOTE — Progress Notes (Signed)
D   Pt is bizarre and has movements that appear to be EPS but are not   Her thinking is disorganized and she is thought blocking   She has been heard yelling and responding to internal stimuli A   Verbal support given   Medications administered and effectiveness monitored   Q 15 min checks R   Pt safe at present

## 2012-06-13 NOTE — Discharge Planning (Signed)
Pt did not attend discharge planning group.   

## 2012-06-13 NOTE — BHH Counselor (Signed)
Per [facility] - pt accepted by [facility] NP to Dr. Dub Mikes, room 407. RN and EDP notified.

## 2012-06-13 NOTE — BHH Counselor (Signed)
Support paperwork signed and faxed to [facility]. [employer] placed in pt's chart. First opinion completed and placed in pt's chart.

## 2012-06-13 NOTE — H&P (Signed)
Patient was seen for suicidal assessment and Reviewed document and agree with the treatment recommendations.

## 2012-06-13 NOTE — Tx Team (Signed)
Initial Interdisciplinary Treatment Plan  PATIENT STRENGTHS: (choose at least two) Physical Health Supportive family/friends  PATIENT STRESSORS: medication noncompliance   PROBLEM LIST: Problem List/Patient Goals Date to be addressed Date deferred Reason deferred Estimated date of resolution  Psychosis 06/13/12     Med noncompliance 06/13/12                                                 DISCHARGE CRITERIA:  Improved stabilization in mood, thinking, and/or behavior Need for constant or close observation no longer present Verbal commitment to aftercare and medication compliance  PRELIMINARY DISCHARGE PLAN: Outpatient therapy Return to previous living arrangement  PATIENT/FAMIILY INVOLVEMENT: This treatment plan has been presented to and reviewed with the patient, Toniya Connye Burkitt, and/or family member, .  The patient and family have been given the opportunity to ask questions and make suggestions.  Renaee Munda 06/13/2012, 4:49 AM

## 2012-06-13 NOTE — Progress Notes (Signed)
The focus of this group is to help patients establish daily goals to achieve during treatment and discuss how the patient can incorporate goal setting into their daily lives to aide in recovery.  Did not attend

## 2012-06-13 NOTE — Progress Notes (Signed)
B[facility] Group Notes:  (Counselor/Nursing/MHT/Case Management/Adjunct)  06/13/2012 11:45 AM  Type of Therapy:  Group Therapy  Participation Level:  Did Not Attend  Calvert Cantor 06/13/2012, 11:45 AM

## 2012-06-14 DIAGNOSIS — Z9119 Patient's noncompliance with other medical treatment and regimen: Secondary | ICD-10-CM

## 2012-06-14 MED ORDER — ZIPRASIDONE HCL 60 MG PO CAPS
60.0000 mg | ORAL_CAPSULE | Freq: Every day | ORAL | Status: AC
  Administered 2012-06-14: 60 mg via ORAL
  Filled 2012-06-14 (×3): qty 1

## 2012-06-14 NOTE — Progress Notes (Signed)
D: Patient in hallway on approach.  Patient is observed talking to herself. Patient is suspicious and guarded.  Patient walking off while writer is trying to talk to her but cooperative with interview process.  Patient states she is here because of her mother.  Patient denies SI/HI and denies AVH but appears to be responding the internal stimuli.  Patient wanted to leave out of the day room.  Patient states she had been slacking on taking her medications.  Patient denies depression and anxiety.    A: Staff to monitor Q 15 mins for safety.  Encouragement and support offered.  Scheduled medications administered per orders.  Writer went over medication times with patient. R: Patient remains safe on the unit.  Patient taking administered medication but needs encouraging and patient remains suspicious.

## 2012-06-14 NOTE — Progress Notes (Signed)
Patient ID: [name], female   DOB: [birth year], 26 y.o.   MRN: [id number] D. The patient came to group this evening with minimal participation. Stated that her mother was her support system and that she went somewhere in the community for support, but could not identify where. Denies a/v hallucinations, but is often observed talking to herself or moving her mouth as if whispering to someone.  At bed time became very paranoid and wanted to be discharged to a clinic.  A. Encouraged to attend evening wrap up group. Came on her own for HS medications, although she had nothing prescribed at this time. At bed time became paranoid and did not want to sleep in her room. Offered to sleep in the quiet room or dayroom.  R. The patient chose to sleep in the dayroom.

## 2012-06-14 NOTE — Discharge Planning (Signed)
[name] attended both my groups.  Was unable to contribute to either one.  Tried to several times, but was unable to put together any coherent thoughts.  She did communicate her frustration with her current state of thinking, and became somewhat agitated but redirectable.

## 2012-06-14 NOTE — Progress Notes (Signed)
Psychoeducational Group Note  Date:  06/14/2012 Time:  2000  Group Topic/Focus:  Wrap-Up Group:   The focus of this group is to help patients review their daily goal of treatment and discuss progress on daily workbooks.  Participation Level:  Minimal  Participation Quality:  Appropriate  Affect:  Appropriate  Cognitive:  Confused  Insight:  Limited  Engagement in Group:  Limited  Additional Comments:  Patient attended and participated in group tonight. She reports that she had an all right day. She had visitors. She relaxed and watch television. Her support system is her family.  Lita Mains [facility] 06/14/2012, 12:05 AM

## 2012-06-14 NOTE — Progress Notes (Signed)
BHH In Patient Progress Note 06/14/2012 10:04 PM [name] Weida 12-01-1985 [id number] Hospital day #:1 Diagnosis:  Axis I: Schizoaffective Disorder - Bipolar Type Met with the patient who reports the following: ADL's:  Intact Sleep:  Greatly improved Appetite:ok  Groups:Good  Subjective: [name] states she is back with [name] due to a misunderstanding about her medications. She states she was told that she could take it as needed.  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  Now that she is at the hospital she is not going to get the job and she is frustrated.    : height is 5' 3.5" (1.613 m) and weight is 64.864 kg (143 lb). Her oral temperature is 98.4 F (36.9 C). Her blood pressure is 108/71 and her pulse is 70. Her respiration is 16.   Objective: Patient looks better and is more alert and aware. She is disorganized but not as sick as she has been in the past.  Ros: Constitutional: WD WN Adult in NAD ENT:      negative for runny nose, sore throat, congestion, dysphagia COR:     negative for cough, SOB, chest pain, wheezing GI:         negative for Nausea, vomiting, diarrhea, constipation, abdominal pain Neuro:  negative for dizziness, blurred vision, headaches, numbness or tingling Ortho:   negative for limb pain, swelling, change in ambulatory status.  Mental Status Exam Level of Consciousness: awake Orientation: x 3 General Appearance:  Casual but neatly coiffed Behavior:  cooperative Eye Contact:  good Motor Behavior:  normal Speech:  clear Mood: anxious and depressed Suicidal Ideation: No suicidal ideation, no plan, no intent, no means. Homicidal Ideation:  No homicidal ideation, no plan, no intent, no means.  Affect:  congruent Anxiety Level: severe Thought Process:  Focused on her lack of a job and no freedom from her family. Thought Content:  Denies AVH Perception:  intact Judgment:   poor Insight:  lacking Cognition:  average,  Sleep:  Number of Hours: 2.5  Lab Results:   Labs are reviewed. Physical Findings: AIMS: CIWA-Ar Total: 1  CIWA:  CIWA-Ar Total: 1  COWS:  COWS Total Score: 1  Medication:  . carbamazepine  400 mg Oral QHS  . ziprasidone  120 mg Oral QHS    Treatment Plan Summary: 1. Continue for crisis management and stabilization. 2. Medication management to reduce current symptoms to base line and improve the patient's overall level of functioning 3. Treat health problems as indicated. 4. Develop treatment plan to decrease risk of relapse upon discharge and the need for readmission. 5. Psycho-social education regarding relapse prevention and self care. 6. Health care follow up as needed for medical problems. 7. Restart home medications where appropriate.   Plan:  1.Will continue the patient on the medication Tegretol XR at 400 mgs po qhs for mood stabilization.  2.Will continue the patient on the medication Geodon at 120 mgs po qhs for mood stabilization and psychosis.  3. Will continue the patient on the medication Trazodone 50 mgs po qhs with a repeat - prn for sleep.  4 Will order tegratol level.  5. Will continue to monitor.  6. Will follow today and anticipate discharge home tomorrow if no complications from changes in medication.  Rona Ravens. Atilano Covelli PAC 06/14/2012, 10:04 PM

## 2012-06-14 NOTE — Progress Notes (Signed)
The focus of this group is to help patients review their daily goal of treatment and discuss progress on daily workbooks. Pt attended the evening group session and remained for the duration. Pt appeared shy and guarded, but quietly responded when prompted by the Writer.

## 2012-06-14 NOTE — Progress Notes (Signed)
Psychoeducational Group Note  Date:  06/14/2012 Time:  1100  Group Topic/Focus:  Self Care:   The focus of this group is to help patients understand the importance of self-care in order to improve or restore emotional, physical, spiritual, interpersonal, and financial health.  Participation Level:  Active  Participation Quality:  Appropriate  Affect:  Appropriate  Cognitive:  Appropriate  Insight:  Good  Engagement in Group:  Good  Additional Comments:  Pt participated and processed in group.  Marquis Lunch, Cortez Steelman 06/14/2012, 7:22 PM

## 2012-06-14 NOTE — Treatment Plan (Signed)
Interdisciplinary Treatment Plan Update (Adult)  Date: 06/14/2012  Time Reviewed: 10:38 AM   Progress in Treatment: Attending groups: Yes Participating in groups: Yes Taking medication as prescribed: Yes Tolerating medication: Yes   Family/Significant other contact made:  No Patient understands diagnosis:  No  Limited insight Discussing patient identified problems/goals with staff:  Yes See below Medical problems stabilized or resolved:  Yes Denies suicidal/homicidal ideation: Yes  In AM group Issues/concerns per patient self-inventory:  Not filled out Other:  New problem(s) identified: N/A  Reason for Continuation of Hospitalization: Hallucinations Medication stabilization  Interventions implemented related to continuation of hospitalization: Restart usual meds  Encourage group attendance and participation  Additional comments:  Estimated length of stay: 3-4 days  Discharge Plan: return home, possible referral to ACT team  New goal(s): N/A  Review of initial/current patient goals per problem list:   1.  Goal(s):Eliminate psychosis  Met:  No  Target date:10/25  As evidenced HQ:IONG report/observation   2.  Goal (s): Identify comprehensive mental wellness plan  Met:  No  Target date:10/25  As evidenced EX:BMWU report  3.  Goal(s):  Met:  No  Target date:  As evidenced by:  4.  Goal(s):  Met:  No  Target date:  As evidenced by:  Attendees: Patient:     Family:     Physician:  Dr Jannifer Franklin 06/14/2012 10:38 AM   Nursing: Garen Lah   06/14/2012 10:38 AM   Case Manager:  Richelle Ito  06/14/2012 10:38 AM   Counselor:   06/14/2012 10:38 AM   Other:     Other:     Other:     Other:      Scribe for Treatment Team:   Ida Rogue, 06/14/2012 10:38 AM

## 2012-06-14 NOTE — Progress Notes (Signed)
Patient remains with internal stimuli; talking to self.  She has disorganized thought processes and is fearful of her room.  She has paranoid thoughts of going into her room to use the restroom and staff has attempted to reinforce that she is safe on the unit and in her room.  She is hesitant to take her tegretol, as she reports this medication has caused her to have heart palpatations.  Patient was kept back from lunch as she was tearful and staff attempted to consol her.  She denies any SI/HI/AVH.  Her mother called and asked about her well being and was told that she there has been no change since her admission.    Continue to monitor medication management and MD orders.  Safety checks completed every 15 minutes per protocol.  Collaborate with treatment team regarding patient's POC.    Reassure patient of her safety on the unit.  Encourage patient to participate in her treatment and stay compliant with her medication.

## 2012-06-15 MED ORDER — PALIPERIDONE ER 6 MG PO TB24
6.0000 mg | ORAL_TABLET | Freq: Every day | ORAL | Status: DC
  Administered 2012-06-15 – 2012-06-24 (×10): 6 mg via ORAL
  Filled 2012-06-15 (×3): qty 1
  Filled 2012-06-15: qty 2
  Filled 2012-06-15 (×8): qty 1
  Filled 2012-06-15: qty 2

## 2012-06-15 NOTE — Progress Notes (Signed)
D: Patient was not visible on the unit.  Patient has been in bed tonight.  Writer had to wake up patient to communicate with her.  Patient states she was started on a new medication and she has been tired.  Patient denies SI/HI and denies AVH but still appears to be responding to internal stimuli.   A: Staff to monitor Q 15 mins for safety.  Encouragement and support offered.  No medications scheduled for administrations tonight. R: Patient remains safe on the unit.  Patient calm, cooperative and in bed.  Patient did not attend group tonight.  Patient remains in bed.

## 2012-06-15 NOTE — Progress Notes (Signed)
Patient ID: [name], female   DOB: [birth year], 26 y.o.   MRN: [id number] Patient has had minimal interaction with staff and peers.  She has had some aggressive interaction with another patient this am in which she handled well.  She still remains resistant to her medication and has to be coaxed to take it.  She had a visit with her mother and asked staff about family counseling options and stated she would ask her counselor about same.  She denies any SI/HI/AVH at this time.  Patient still mumbles to herself even while talking to staff and appears to be responding to internal stimuli.  She is tearful at times, however, is easily consoled.    Continue to monitor medication management and MD orders.  Safety checks completed every 15 minutes per protocol.  Collaborate with treatment team members regarding patient's POC.  Encourage patient to continue to attend groups and participate in her treatment.  Her behavior has been appropriate on the milieu.

## 2012-06-15 NOTE — Discharge Planning (Signed)
[name] attended both groups today.  She slept through most of both of them, and had nothing to contribute to the discussion.

## 2012-06-15 NOTE — Progress Notes (Signed)
[facility] In Patient Progress Note 06/15/2012 5:30 PM [name] [birth year] [id number] Hospital day #:2 Diagnosis:  Axis I: Schizoaffective Disorder - Bipolar Type Met with the patient who reports the following: ADL's:  Intact Sleep:  Greatly improved Appetite:ok  Groups:Good  Subjective: [name] is disorganized this morning but cooperative.  She is frustrated that she can't seem to gain independence from her mother. She explains again that she thought she could take her medication as needed.   : height is 5' 3.5" (1.613 m) and weight is 64.864 kg (143 lb). Her oral temperature is 98.6 F (37 C). Her blood pressure is 110/76 and her pulse is 83. Her respiration is 22.   Objective: [name] is offered the opportunity to consider injectable medication and she is very interested in this.  She asks about side effects and Hinda Glatter is discussed with her.  She agrees to start the oral medication with a goal of moving toward the injectable once she is stabilized.  She also asks that her mother be involved in the discussions regarding the medication. Ros: Constitutional: WD WN Adult in NAD ENT:      negative for runny nose, sore throat, congestion, dysphagia COR:     negative for cough, SOB, chest pain, wheezing GI:         negative for Nausea, vomiting, diarrhea, constipation, abdominal pain Neuro:  negative for dizziness, blurred vision, headaches, numbness or tingling Ortho:   negative for limb pain, swelling, change in ambulatory status.  Mental Status Exam Level of Consciousness: awake Orientation: x 3 General Appearance:  Casual  Behavior:  cooperative Eye Contact:  good Motor Behavior:  normal Speech:  clear Mood: anxious and frustrated  Suicidal Ideation: No suicidal ideation, no plan, no intent, no means. Homicidal Ideation:  No homicidal ideation, no plan, no intent, no means.  Affect:  congruent Anxiety Level: severe Thought Process:  Focused on her lack of a job and no freedom from her  family. Thought Content:  Denies AVH Perception:  intact Judgment:  fair Insight:  improving Cognition:  average,  Sleep:  Number of Hours: 4.5  Lab Results:   Labs are reviewed. Physical Findings: AIMS: CIWA-Ar Total: 1  CIWA:  CIWA-Ar Total: 1  COWS:  COWS Total Score: 1  Medication:  . carbamazepine  400 mg Oral QHS  . ziprasidone  120 mg Oral QHS    Treatment Plan Summary: 1. Continue for crisis management and stabilization. 2. Medication management to reduce current symptoms to base line and improve the patient's overall level of functioning 3. Treat health problems as indicated. 4. Develop treatment plan to decrease risk of relapse upon discharge and the need for readmission. 5. Psycho-social education regarding relapse prevention and self care. 6. Health care follow up as needed for medical problems. 7. Restart home medications where appropriate.   Plan:  1.Will continue the patient on the medication Tegretol XR at 400 mgs po qhs for mood stabilization.  2.Will discontinue the Geodon 60mg  at hs tonight. 3.Will start Invega 6mg  po tonight after consultation with pharmacist. 5.Will continue to monitor.  6.Will follow today and anticipate discharge home tomorrow if no complications from changes in medication.  Rona Ravens. [name] PAC 06/15/2012, 5:30 PM

## 2012-06-16 DIAGNOSIS — F259 Schizoaffective disorder, unspecified: Principal | ICD-10-CM

## 2012-06-16 NOTE — Progress Notes (Signed)
Adult Psychosocial Assessment Update Interdisciplinary Team  Previous [facility] admissions/discharges:  Admissions Discharges  Date: 06/13/12 Date:  Date: 05/21/12 Date:  Date:03/10/12 Date:  Date:10/21/07 Date:  Date:05/27/06 Date:   Changes since the last Psychosocial Assessment (including adherence to outpatient mental health and/or substance abuse treatment, situational issues contributing to decompensation and/or relapse). 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  Patient reported that her mother had been concerned about her depressive symptoms and behaviors.   Patient said that she and her mother are in moving process which is causing them both stress.       Discharge Plan 1. Will you be returning to the same living situation after discharge?   Yes: home with her mother No:      If no, what is your plan?    Pt reported that she and her mother are currently in the moving process.        2. Would you like a referral for services when you are discharged? Yes:     If yes, for what services?  No:       Yes. Pt is interested in [facility] ACT services but would like to set up first appointment after she and her mother move to new location.        Summary and Recommendations (to be completed by the evaluator) Medication stabilization, Therapeutic milieu  Refer to ACT team services                       Signature:  Smart, Ledell Peoples, 06/16/2012 11:20 AM

## 2012-06-16 NOTE — Discharge Planning (Signed)
Pt attended morning aftercare planning group. She reported that she felt good this morning. Pt talked quietly to herself throughout group. SW talked to pt about ACT team and will set up interview with [facility] for pt.  [name] attended PM group with Onalee Hua from [facility].  She slept through the group.

## 2012-06-16 NOTE — Progress Notes (Addendum)
D: Patient in dayroom coloring a picture.  Patient calm, cooperative and brighter on approach today.  Patient states she thinks her medication is working.  Patient smiles when speaking with Clinical research associate.  Patient states she feels good as long as she is doing something.  Patient denies depression and anxiety.  Patient denies SI/HI and denies AVH. Patient still appears to be responding to internal stimuli. A: Staff to monitor Q 15 mins for safety.  Encouragement and support offered.  No scheduled medications to administer tonight. R: Patient remains safe on the unit.  Patient calm, cooperative and attended group tonight.

## 2012-06-16 NOTE — Progress Notes (Signed)
[facility] In Patient Progress Note 06/17/2012 12:17 AM [name] January 24, 1986 [id number] Hospital day #:4 Diagnosis:  Axis I: Schizoaffective Disorder - Bipolar Type Met with the patient who reports the following: ADL's:  Intact Sleep:  Greatly improved Appetite:ok  Groups:Good  Subjective: [name] received her first oral Invega last night as well as her last dose of Geodon. Today she is much more alert and organized. She is focused and can participate in a conversation. :: height is 5' 3.5" (1.613 m) and weight is 64.864 kg (143 lb). Her oral temperature is 97.4 F (36.3 C). Her blood pressure is 103/74 and her pulse is 90. Her respiration is 20.   Objective: Good initial response to Invega orally. Reports no side effects. Still feels that the Injection will be the best choice for her.  Ros: Constitutional: WD WN Adult in NAD ENT:      negative for runny nose, sore throat, congestion, dysphagia COR:     negative for cough, SOB, chest pain, wheezing GI:         negative for Nausea, vomiting, diarrhea, constipation, abdominal pain Neuro:  negative for dizziness, blurred vision, headaches, numbness or tingling Ortho:   negative for limb pain, swelling, change in ambulatory status.  Mental Status Exam Level of Consciousness: awake Orientation: x 3 General Appearance:  Casual  Behavior:  cooperative Eye Contact:  good Motor Behavior:  normal Speech:  clear Mood: anxious but less frustrated  Suicidal Ideation: No suicidal ideation, no plan, no intent, no means. Homicidal Ideation:  No homicidal ideation, no plan, no intent, no means.  Affect:  congruent Anxiety Level: decreasing Thought Process:  Returning to baseline. She is more focused and less frantic and less disorganized today. Thought Content:  Denies AVH Perception:  intact Judgment:  fair Insight:  improving Cognition:  average,  Sleep:  Number of Hours: 5.5  Lab Results:   Labs are reviewed. Physical Findings: AIMS:  CIWA-Ar Total: 1  CIWA:  CIWA-Ar Total: 1  COWS:  COWS Total Score: 1  Medication:  . carbamazepine  400 mg Oral QHS  . ziprasidone  120 mg Oral QHS    Treatment Plan Summary: 1. Continue for crisis management and stabilization. 2. Medication management to reduce current symptoms to base line and improve the patient's overall level of functioning 3. Treat health problems as indicated. 4. Develop treatment plan to decrease risk of relapse upon discharge and the need for readmission. 5. Psycho-social education regarding relapse prevention and self care. 6. Health care follow up as needed for medical problems. 7. Restart home medications where appropriate.   Plan:  1.Will continue the patient on the medication Tegretol XR at 400 mgs po qhs for mood stabilization.  2. 3.Will continue Invega 6mg  po tonight after consultation with pharmacist. 5.Will continue to monitor.  6.Will follow today and anticipate discharge home Friday or Saturday after injection if no complications from changes in medication.  Rona Ravens. Chuckie Mccathern PAC 06/17/2012, 12:17 AM

## 2012-06-16 NOTE — Progress Notes (Signed)
D:Pt is paranoid and preoccupied. Pt was reluctant to take medications starring at medicines and then looking around mumbling. Pt continued to put her medicine cup down and required much redirection to take medications. A:Supported pt to take medication. Encouraged groups and supported pt to discuss feelings. R:Pt interacts with her roommate. She goes to groups and takes medicine with prompting and redirection. Safety maintained on unit.

## 2012-06-17 NOTE — Progress Notes (Signed)
Patient ID: [name], female   DOB: 1986-04-27, 26 y.o.   MRN: [id number] Patient has bright affect; not as preoccupied with voices.  She is compliant with her medications and not as resistant to taking them.  She denies any SI/HI/AVH.  She interacts with staff and responds.  She has been attending groups and has had minimal participation.  She is receptive to receiving an injection of her medication when she is discharged from [facility].  Discharge planning is continuing for her.   Continue to monitor medication management and MD orders.  Collaborate with treatment team members regarding patient's POC.  Safety checks continued every 15 minutes per protocol.  Patient's behavior has been appropriate on the milieu.  She continues to progress with her treatment.

## 2012-06-17 NOTE — Progress Notes (Signed)
[facility] In Patient Progress Note 06/17/2012 12:15 PM [name] 04-Jan-1986 [id number] Hospital day #:4 Diagnosis:  Axis I: Schizoaffective Disorder - Bipolar Type Met with the patient who reports the following: ADL's:  Intact Sleep:  Greatly improved Appetite:fine  Groups:Good  Subjective: Patient reports decreased mood swings, irritability since she started taking oral Invega. Her thoughts is getting more organized. Patient agreed to a trial of Invega injectable.  :: height is 5' 3.5" (1.613 m) and weight is 64.864 kg (143 lb). Her oral temperature is 97.4 F (36.3 C). Her blood pressure is 110/74 and her pulse is 99. Her respiration is 18.   Objective: Good initial response to Invega orally. Reports no side effects. Still feels that the Injection will be the best choice for her.  Ros: Constitutional: WD WN Adult in NAD ENT:      negative for runny nose, sore throat, congestion, dysphagia COR:     negative for cough, SOB, chest pain, wheezing GI:         negative for Nausea, vomiting, diarrhea, constipation, abdominal pain Neuro:  negative for dizziness, blurred vision, headaches, numbness or tingling Ortho:   negative for limb pain, swelling, change in ambulatory status.  Mental Status Exam Level of Consciousness: awake Orientation: x 3 General Appearance:  Casual  Behavior:  cooperative Eye Contact:  good Motor Behavior:  normal Speech:  clear Mood: anxious  Suicidal Ideation: No suicidal ideation, no plan, no intent, no means. Homicidal Ideation:  No homicidal ideation, no plan, no intent, no means.  Affect:  Mood congruent Anxiety Level: decreasing Thought Process:  Returning to baseline. She is more focused and less frantic and less disorganized today. Thought Content:  Denies A/V hallucinations, ongoing ruminations Perception:  intact Judgment:  marginal Insight:  marginal Cognition:  average,  Sleep:  Number of Hours: 6.5  Lab Results:   Labs are  reviewed. Physical Findings: AIMS: CIWA-Ar Total: 1  CIWA:  CIWA-Ar Total: 1  COWS:  COWS Total Score: 1  Medication:  . carbamazepine  400 mg Oral QHS  . ziprasidone  120 mg Oral QHS    Treatment Plan Summary: 1. Continue for crisis management and stabilization. 2. Medication management to reduce current symptoms to base line and improve the patient's overall level of functioning 3. Treat health problems as indicated. 4. Develop treatment plan to decrease risk of relapse upon discharge and the need for readmission. 5. Psycho-social education regarding relapse prevention and self care. 6. Health care follow up as needed for medical problems. 7. Restart home medications where appropriate.   Plan:  1.Will continue the patient on the medication Tegretol XR at 400 mgs po qhs for mood stabilization.  2. 3.Will continue Invega 6mg  po tonight after consultation with pharmacist. 5.Will continue to monitor.  6.Will follow today and anticipate discharge home Friday or Saturday after injection if no complications from changes in medication.  Thedore Mins, MD 06/17/2012, 12:15 PM

## 2012-06-17 NOTE — Progress Notes (Signed)
Psychoeducational Group Note  Date:  06/16/2012   Time:  1100  Group Topic/Focus:  Overcoming Stress:   The focus of this group is to define stress and help patients assess their triggers.  Participation Level:  Minimal  Participation Quality:  Redirectable  Affect:  Labile  Cognitive:  Appropriate  Insight:  Limited  Engagement in Group:  Limited  Additional Comments:    Kayelee Herbig A 06/17/2012, 10:36 AM

## 2012-06-17 NOTE — Progress Notes (Signed)
Seen and agreed. Wally Behan, MD 

## 2012-06-17 NOTE — Progress Notes (Signed)
Patient did attend the evening karaoke group. Pt reported having fun and singing a song although she could not recall the name of the song she sang.

## 2012-06-17 NOTE — Discharge Planning (Signed)
Pt attended morning aftercare planning group. She reported that she is "doing well" today. Pt said that she is still deciding if ACT team is good idea for her. SW informed pt that he called ACT team and will get back to her today concerning potential interview time, where she will have the opportunity to see if ACT team is a good fit for her. Pt agreeable to this. Pt asking about potential discharge date and was told that the doctor will be notified that she is asking about this. Pt referred to [facility] ACT team. CM intern spoke with pt's mother to verify that she can take pt to appt on Nov 1st at 2:00PM. Pt's mother also notified that pt may be discharging Friday or early next week and will provide pt with ride home.  Mother also confirmed that the family is not, in fact, moving now as Kalin related but is planning on moving in May of 2014.

## 2012-06-18 NOTE — Progress Notes (Signed)
[facility] MD Progress Note  06/18/2012 7:21 PM  Diagnosis:  Schizoaffective disorder, bipolar type  ADL's:  Intact  Sleep: Good  Appetite:  Good Suicidal Ideation:  denies Homicidal Ideation:  denies  AEB (as evidenced by):  Review of Systems  Constitutional: Negative.   HENT: Negative.   Eyes: Negative.   Cardiovascular: Negative.   Gastrointestinal: Negative.   Skin: Negative.   Neurological: Negative.   Psychiatric/Behavioral: Negative for depression, suicidal ideas, hallucinations, memory loss and substance abuse. The patient does not have insomnia. Nervous/anxious: Little bit.     Mental Status Examination: Objective:  Appearance: Fairly Groomed  Eye Contact::  Good  Speech:  Slow  Volume:  Normal  Psychomotor Activity:  Normal  Concentration:  Fair  Recall:  Fair  Akathisia:  No  Handed:  Right  AIMS (if indicated):     Assets:  Architect Housing Physical Health Social Support  Sleep:  Number of Hours: 5    Vital Signs:Blood pressure 109/72, pulse 86, temperature 97.9 F (36.6 C), temperature source Oral, resp. rate 16, height 5' 3.5" (1.613 m), weight 64.864 kg (143 lb), last menstrual period 05/14/2012. Current Medications: Current Facility-Administered Medications  Medication Dose Route Frequency Provider Last Rate Last Dose  . acetaminophen (TYLENOL) tablet 650 mg  650 mg Oral Q6H PRN Verne Spurr, PA-C      . alum & mag hydroxide-simeth (MAALOX/MYLANTA) 200-200-20 MG/5ML suspension 30 mL  30 mL Oral Q4H PRN Verne Spurr, PA-C      . carbamazepine (TEGRETOL XR) 12 hr tablet 200 mg  200 mg Oral BID Mickie D. Adams, PA   200 mg at 06/18/12 1710  . magnesium hydroxide (MILK OF MAGNESIA) suspension 30 mL  30 mL Oral Daily PRN Verne Spurr, PA-C      . paliperidone (INVEGA) 24 hr tablet 6 mg  6 mg Oral Daily Verne Spurr, PA-C   6 mg at 06/18/12 6213    Lab Results: No results found for this or any previous visit  (from the past 48 hour(s)).  Physical: Physical Exam  Constitutional: She appears well-developed and well-nourished.  Psychiatric: Her behavior is normal. Her mood appears anxious (had hoped to go home today). Her affect is not angry, not blunt, not labile and not inappropriate. Her speech is delayed (slower). Her speech is not rapid and/or pressured, not tangential and not slurred. She is actively hallucinating (denies). Thought content is paranoid. Delusional: denies. Cognition and memory are impaired (at least average). She does not express impulsivity or inappropriate judgment. She exhibits a depressed mood (dissapointed). She expresses no homicidal and no suicidal ideation. She expresses no suicidal plans and no homicidal plans. She is communicative. She exhibits normal recent memory and normal remote memory.         She is attentive.    AIMS: normal CIWA:  0 COWS: 0   Treatment Plan Summary: Daily contact with patient to assess and evaluate symptoms and progress in treatment Medication management  Plan: 1. Will continue the invega at 6mg . With goal to move towards Sustena injection. 2. Hopefully ACT will make contact over the weekend or early Monday.  3. Will continue with plan to discharge Monday or Tuesday if ACT group can coordinate contact quickly and Maryellen continues to do well. Rona Ravens. Avory Rahimi PAC 06/18/2012, 7:21 PM

## 2012-06-18 NOTE — Progress Notes (Signed)
Psychoeducational Group Note  Date:  06/18/2012 Time: 2000  Group Topic/Focus:  Wrap-Up Group:   The focus of this group is to help patients review their daily goal of treatment and discuss progress on daily workbooks.  Participation Level:  Active  Participation Quality:  Appropriate  Affect:  Appropriate  Cognitive:  Alert  Insight:  Good  Engagement in Group:  Good  Additional CommentsAdelina Mings 06/18/2012, 9:56 PM

## 2012-06-18 NOTE — Treatment Plan (Signed)
Interdisciplinary Treatment Plan Update (Adult)  Date: 06/18/2012  Time Reviewed: 10:54 AM   Progress in Treatment: Attending groups: Yes Participating in groups: Yes Taking medication as prescribed: Yes Tolerating medication: Yes   Family/Significant other contact made:   Patient understands diagnosis:  Yes Discussing patient identified problems/goals with staff:  Yes Medical problems stabilized or resolved:  Yes Denies suicidal/homicidal ideation: Yes  In tx team Issues/concerns per patient self-inventory:  Not filled out Other:  New problem(s) identified: N/A  Reason for Continuation of Hospitalization: Medication stabilization  Interventions implemented related to continuation of hospitalization: Continue current meds  Encourage group attendance and participation  Additional comments:  Estimated length of stay: 2-3 days  Discharge Plan: see below New goal(s): N/A  Review of initial/current patient goals per problem list:   1.  Goal(s): Eliminate SI  Met:  Yes  Target date:see previous tx plan  As evidenced by:  2.  Goal (s):Eliminate psychosis  Met:  No  Target date:10/28  As evidenced ZO:XWRU report/observation  3.  Goal(s):Identify Comprehensive mental wellness plan  Met:  Yes  Target date:10/25  As evidenced by: [name] will work with [facility] ACT team.  We have contacted her mother to confirm that she will have transportation to her intake appointment on the 1st of Nov  4.  Goal(s):  Met:  Yes  Target date:  As evidenced by:  Attendees: Patient:  [name] 06/18/2012 10:54 AM  Family:     Physician:  Thedore Mins 06/18/2012 10:54 AM   Nursing:  Nestor Ramp  06/18/2012 10:54 AM   Case Manager:  Richelle Ito, 06/18/2012 10:54 AM   Extender: Verne Spurr PA 06/18/2012 10:54 AM   Other:     Other:     Other:     Other:      Scribe for Treatment Team:   Ida Rogue, 06/18/2012 10:54 AM

## 2012-06-18 NOTE — Progress Notes (Signed)
  D) Patient quiet and cooperative upon my assessment. Patient appears bright this morning. Patient states slept "well," and  appetite is "good." Patient rates depression as  0 /10, patient rates hopeless feelings as  7/10. Patient denies SI/HI, denies A/V hallucinations. Patient conversation is animated but preoccupied and tangential. Patient childlike, suspicious of medications.   A) Patient offered support and encouragement, patient encouraged to discuss feelings/concerns with staff. Patient verbalized understanding. Patient monitored Q15 minutes for safety. Patient met with MD to discuss today's goals and plan of care.  R) Patient isolates to room at times, attending some groups in day room and all meals in dining room.  Patient taking medications only with maximum encouragement/redirection from RN. Will continue to monitor.

## 2012-06-18 NOTE — Discharge Planning (Signed)
Pt did not attend morning aftercare planning group. CM Intern saw pt individually. She reported sleeping well and feeling good this morning. Pt was told that her mother was contacted yesterday and informed about [facility] ACT team. Pt signed releases for [facility] and [facility] and was agreeable to receiving services. She was encouraged to attend the rest of the day's groups. Pt attended and reluctantly participated in afternoon group, where the topic was "finding balance in life."  She shared that her mother helps her identify when she is unbalanced, and that she is feeling balanced today.

## 2012-06-18 NOTE — Progress Notes (Signed)
Psychoeducational Group Note  Date:  06/18/2012 Time: 2000  Group Topic/Focus:  Wrap-Up Group:   The focus of this group is to help patients review their daily goal of treatment and discuss progress on daily workbooks.  Participation Level:  Active  Participation Quality:  Appropriate  Affect:  Appropriate  Cognitive:  Alert  Insight:  Good  Engagement in Group:  Good  Additional Comments:    Annell Greening Casina 06/18/2012, 10:01 PM

## 2012-06-18 NOTE — Progress Notes (Signed)
D   Pt is less guarded and more spontaneous than last weekend   Her affect is bright and she is frequently smiling   Her grooming has improved   She socializes more and is appropriate with others A   Verbal support given  Medications administered and effectiveness monitored   Q 15 min checks R   Pt safe at present

## 2012-06-19 NOTE — Progress Notes (Signed)
Psychoeducational Group Note  Date:  06/19/2012 Time:  0945 am  Group Topic/Focus:  Identifying Needs:   The focus of this group is to help patients identify their personal needs that have been historically problematic and identify healthy behaviors to address their needs.  Participation Level:  Minimal  Participation Quality:  Appropriate  Affect:  Blunted  Cognitive:  Alert  Insight:  Good  Engagement in Group:  Limited  Additional Comments:    Andrena Mews 06/19/2012,12:15 PM

## 2012-06-19 NOTE — Progress Notes (Signed)
[facility] Group Notes:  (Counselor/Nursing/MHT/Case Management/Adjunct)  06/19/2012 12:44 PM  Type of Therapy:  Group Therapy  Participation Level:  None  Participation Quality:  Inattentive  Affect:  Flat  Cognitive:  Confused  Insight:  None  Engagement in Group:  None  Engagement in Therapy:  None  Modes of Intervention:  Clarification  Summary of Progress/Problems: Pt did not participate in the group discussion.   Estevan Ryder Renee 06/19/2012, 12:44 PM

## 2012-06-19 NOTE — Progress Notes (Addendum)
[facility] Group Notes:  (Counselor/Nursing/MHT/Case Management/Adjunct)  06/19/2012 1000  Type of Therapy:  Nurse Education  Self Inventory Sheets  Participation Level:  Minimal  Participation Quality:  Attentive  Affect:  Appropriate  Cognitive:  Alert  Insight:  Good  Engagement in Group:  Limited  Engagement in Therapy:  Good  Modes of Intervention:  Education  Summary of Progress/Problems:   [name] 06/19/2012, 1000

## 2012-06-19 NOTE — Progress Notes (Signed)
Patient ID: [name], female   DOB: Oct 14, 1985, 26 y.o.   MRN: [id number] D. The patient isolated in her room the early part of the evening. Although she was asked to attend evening wrap up group, she initially made no attempt to attend. She joined the group towards the end and actively participated. Stated she had a great day and that her mother visited. She is hoping for discharge soon. A. Met with patient 1:1. Encouraged to attend evening group. Gave verbal support and praise for the progress she had made. R. Accepting of support and praise. Denies any suicidal ideation or A/V hallucinations.

## 2012-06-19 NOTE — Progress Notes (Signed)
Agree with note. 

## 2012-06-19 NOTE — Progress Notes (Signed)
Patient ID: [name], female   DOB: 02-Jul-1986, 26 y.o.   MRN: [id number] D. The patient spent a lot of time isolating in her room. Several times told her roommate that she had to leave their room to give her privacy. Attended evening group with minimal participation. Pleasant when interacting with staff, but remains guarded and has difficulty expressing her thoughts. Denies auditory hallucinations, but is preoccupied and observed talking to herself. A. Met with patient 1:1. Encouraged to attend evening wrap up group.  R. Attended group with minimal participation.

## 2012-06-19 NOTE — Progress Notes (Signed)
[facility] MD Progress Note  06/19/2012 2:22 PM  Diagnosis:  Schizoaffective DO -Bipolar   ADL's:  Impaired  Sleep: Good  Appetite:  Good  Suicidal Ideation:  Denies  Homicidal Ideation:  Denies   AEB (as evidenced by):  Mental Status Examination/Evaluation: Objective:  Appearance: Fairly Groomed  Eye Contact::  Good  Speech:  Clear and Coherent  Volume:  Normal  Mood:  Euthymic  Affect:  Congruent  Thought Process:  Goal Directed  Orientation:  Full  Thought Content:  No evidence for AVH/psychosis   Suicidal Thoughts:  No  Homicidal Thoughts:  No  Memory:  Immediate;   Fair  Judgement:  Fair  Insight:  Shallow  Psychomotor Activity:  Normal  Concentration:  Fair  Recall:  Fair  Akathisia:  No  Handed:  Right  AIMS (if indicated):     Assets:  Communication Skills  Sleep:  Number of Hours: 6.5    Vital Signs:Blood pressure 114/77, pulse 92, temperature 98.2 F (36.8 C), temperature source Oral, resp. rate 16, height 5' 3.5" (1.613 m), weight 64.864 kg (143 lb), last menstrual period 05/14/2012. Current Medications: Current Facility-Administered Medications  Medication Dose Route Frequency Provider Last Rate Last Dose  . acetaminophen (TYLENOL) tablet 650 mg  650 mg Oral Q6H PRN Verne Spurr, PA-C      . alum & mag hydroxide-simeth (MAALOX/MYLANTA) 200-200-20 MG/5ML suspension 30 mL  30 mL Oral Q4H PRN Verne Spurr, PA-C      . carbamazepine (TEGRETOL XR) 12 hr tablet 200 mg  200 mg Oral BID Mickie D. Adham Johnson, PA   200 mg at 06/19/12 0844  . magnesium hydroxide (MILK OF MAGNESIA) suspension 30 mL  30 mL Oral Daily PRN Verne Spurr, PA-C      . paliperidone (INVEGA) 24 hr tablet 6 mg  6 mg Oral Daily Verne Spurr, PA-C   6 mg at 06/19/12 1610    Lab Results: No results found for this or any previous visit (from the past 48 hour(s)).  Physical Findings: AIMS: Facial and Oral Movements Muscles of Facial Expression: None, normal Lips and Perioral Area: None,  normal Jaw: None, normal Tongue: None, normal,Extremity Movements Upper (arms, wrists, hands, fingers): None, normal Lower (legs, knees, ankles, toes): None, normal, Trunk Movements Neck, shoulders, hips: None, normal, Overall Severity Severity of abnormal movements (highest score from questions above): None, normal Incapacitation due to abnormal movements: None, normal Patient's awareness of abnormal movements (rate only patient's report): No Awareness, Dental Status Current problems with teeth and/or dentures?: No Does patient usually wear dentures?: No  CIWA:    COWS:     Treatment Plan Summary: Feels fine reviewed with her that we need to get her on the  [place] to keep her this way. ACT team to contact Monday or Tuesday.     Shenica Holzheimer,MICKIE D. 06/19/2012, 2:22 PM

## 2012-06-19 NOTE — Progress Notes (Signed)
D   Pt has been pleasant and cooperative   She has attended group and participated some   She is bright and engaging  She does not appear paranoid and is more logical and coherent in her thinking A   Verbal support given   Medications administered and effectiveness monitored   Q 15 min checks    Provided diversional activities and structured enviroment R   Pt safe at present

## 2012-06-20 NOTE — Progress Notes (Signed)
Patient ID: [name], female   DOB: Jan 19, 1986, 26 y.o.   MRN: [id number] D. The patient isolated in her room most of the evening. She attended group with minimal participation. Her answers to questions were superficial. Denies any auditory hallucinations. A. Met with patient 1:1. Assessed for auditory hallucinations. Encouraged to attend evening wrap up group. R. Denied auditory hallucinations. Minimal interaction in the milieu. Is hoping for discharge tomorrow.

## 2012-06-20 NOTE — Progress Notes (Signed)
[facility] Group Notes:  (Counselor/Nursing/MHT/Case Management/Adjunct)  06/20/2012 09:15AM Type of Therapy:  Nurse Education  Participation Level:  Minimal  Participation Quality:  Appropriate, Attentive, Inattentive, Redirectable and Supportive  Affect:  Anxious, Blunted, Depressed, Excited and Flat  Cognitive:  Alert, Appropriate, Confused and Hallucinating  Insight:  Limited  Engagement in Group:  Limited  Engagement in Therapy:  Limited  Modes of Intervention:  Clarification, Education, Problem-solving and Support  Summary of Progress/Problems:Pt. Tried to stay focused, but was often seen smiling inappropriately to herself and looking away from the group.  Pt. had a confused look on her face at times.   Pixie Casino [facility] 06/20/2012, 10:34 AM

## 2012-06-20 NOTE — Progress Notes (Signed)
[facility] Group Notes:  (Counselor/Nursing/MHT/Case Management/Adjunct)  06/20/2012 12:54 PM  Type of Therapy:  Group Therapy  Participation Level:  Minimal  Participation Quality:  Inattentive  Affect:  Flat  Cognitive:  Confused  Insight:  Limited  Engagement in Group:  Limited  Engagement in Therapy:  None  Modes of Intervention:  Clarification  Summary of Progress/Problems:  The process group therapy focused on each patient identifying the healthy and unhealthy supports in their environment.  Then each patient processed the new supports needed to help them to remain healthy after discharge from the hospital.  The patient did not respond appropriately regarding the discussion in the group.  Estevan Ryder Renee 06/20/2012, 12:54 PM

## 2012-06-20 NOTE — Progress Notes (Signed)
D) Patient has been up and visible in milieu and has been attending various activities, pt has minimal interaction but smiles and makes good eye contact, pt has received medications today without incident and has spoken some about being ready to go home A) Support and encouragement provided  R) Will continue to monitor.

## 2012-06-20 NOTE — Progress Notes (Signed)
The focus of this group is to help patients review their daily goal of treatment and discuss progress on daily workbooks. Pt arrived to the group in the last five minutes, but responded appropriately to discussion prompts from the Writer about how her day was and personal needs.

## 2012-06-20 NOTE — Progress Notes (Signed)
[facility] MD Progress Note  06/20/2012 3:48 PM  Diagnosis:  Schizoaffective disorder bipolar type  ADL's:  Intact  Sleep: Good  Appetite:  Good  Suicidal Ideation:  denies Homicidal Ideation:  denies  AEB (as evidenced by): Subjective:[name] is anxious to go home. We are currently waiting to see if she can receive her first [place] injection prior to discharge but will need to see the regular pharm D. To get this ordered. Mental Status Examination/Evaluation: Objective:  Appearance: Fairly Groomed  Patent attorney::  Good  Speech:  Clear and Coherent  Volume:  Normal  Mood:  Anxious  Affect:  Appropriate and Congruent  Thought Process:  Coherent  Orientation:  Full  Thought Content:  WDL  Suicidal Thoughts:  No  Homicidal Thoughts:  No  Memory:  Immediate;   Good  Judgement:  Fair  Insight:  Fair  Psychomotor Activity:  Normal  Concentration:  Fair  Recall:  Fair  Akathisia:  No  Handed:  Right  AIMS (if indicated):     Assets:  Communication Skills Desire for Improvement Financial Resources/Insurance Housing  Sleep:  Number of Hours: 6.75    Vital Signs:Blood pressure 103/72, pulse 97, temperature 98.7 F (37.1 C), temperature source Oral, resp. rate 18, height 5' 3.5" (1.613 m), weight 64.864 kg (143 lb), last menstrual period 05/14/2012. Current Medications: Current Facility-Administered Medications  Medication Dose Route Frequency Provider Last Rate Last Dose  . acetaminophen (TYLENOL) tablet 650 mg  650 mg Oral Q6H PRN Verne Spurr, PA-C      . alum & mag hydroxide-simeth (MAALOX/MYLANTA) 200-200-20 MG/5ML suspension 30 mL  30 mL Oral Q4H PRN Verne Spurr, PA-C      . carbamazepine (TEGRETOL XR) 12 hr tablet 200 mg  200 mg Oral BID Mickie D. Adams, PA   200 mg at 06/20/12 0849  . magnesium hydroxide (MILK OF MAGNESIA) suspension 30 mL  30 mL Oral Daily PRN Verne Spurr, PA-C      . paliperidone (INVEGA) 24 hr tablet 6 mg  6 mg Oral Daily Verne Spurr, PA-C    6 mg at 06/20/12 0102    Lab Results: No results found for this or any previous visit (from the past 48 hour(s)).  Physical Findings: AIMS: 0 CIWA:    COWS:     Treatment Plan Summary: Daily contact with patient to assess and evaluate symptoms and progress in treatment Medication management  Plan: 1. Will ask Pharm D. In AM about obtaining the first injection of [place] prior to this patient's discharge. 2. Will continue current medications as written. [name] 06/20/2012, 3:48 PM

## 2012-06-20 NOTE — Progress Notes (Signed)
Psychoeducational Group Note  Date:  06/20/2012 Time: 1015 Group Topic/Focus:  Making Healthy Choices:   The focus of this group is to help patients identify negative/unhealthy choices they were using prior to admission and identify positive/healthier coping strategies to replace them upon discharge.  Participation Level:  None  Participation Quality:  Inattentive  Affect:  Depressed  Cognitive:  Delusional  Insight:  None  Engagement in Group:  None  Additional Comments:    [name] 12:48 PM. 06/20/2012

## 2012-06-21 NOTE — Progress Notes (Signed)
Psychoeducational Group Note  Date:  06/21/2012 Time:  2000  Group Topic/Focus:  Wrap-Up Group:   The focus of this group is to help patients review their daily goal of treatment and discuss progress on daily workbooks.  Participation Level:  Active  Participation Quality:  Appropriate  Affect:  Appropriate  Cognitive:  Appropriate  Insight:  Limited  Engagement in Group:  Limited  Additional Comments:  Patient attended and participated in group tonight. She reports having a good day. She attended groups today. She advised that for her wellness she exercise, read, color and listen to music  Scot Dock 06/21/2012, 10:16 PM

## 2012-06-21 NOTE — Progress Notes (Signed)
Patient ID: [name], female   DOB: Jun 23, 1986, 26 y.o.   MRN: [id number] Patient attending groups and participating; she has been interacting well with peers and staff.  She expected to be discharged today and was upset to find out that she would not.  Until information is obtained about her injectable and when it can be given, patient will stay another day or so.  She denies any SI/HI/AVH at this time.  She attended a meeting with her mother and PA regarding her discharge today and was receptive to the information she received.  She is participating more in her treatment now that she has improved.    Continue to monitor medication management and MD orders.  Collaborate with treatment team members regarding patient's POC  Safety checks completed every 15 minutes per protocol.  Patient's behavior has been appropriate on the milieu.

## 2012-06-21 NOTE — Progress Notes (Signed)
Psychoeducational Group Note  Date:  06/21/2012 Time:  1100  Group Topic/Focus:  Wellness Toolbox:   The focus of this group is to discuss various aspects of wellness, balancing those aspects and exploring ways to increase the ability to experience wellness.  Patients will create a wellness toolbox for use upon discharge.  Participation Level:  Did Not Attend  Participation Quality:    Affect:    Cognitive:    Insight:    Engagement in Group:    Additional Comments:  none  Marquis Lunch, Sharika Mosquera 06/21/2012, 5:15 PM

## 2012-06-21 NOTE — Progress Notes (Signed)
Pt observed in her room sitting on the bed.  She tells this Clinical research associate that she is getting ready to go to group.  She smiles appropriately and answers that her day has been good.  She says she is supposed to discharge home tomorrow, and is looking forward to it.  Her thoughts seem clear at this time.  No delusional statements made during our conversation.  She forwards little, but answers questions appropriately.  She denies SI/HI/AV.  She voices no needs/concerns.  Pt was encouraged to make her needs known to staff.  Safety maintained with q15 minute checks.

## 2012-06-21 NOTE — Progress Notes (Signed)
Patient resting quietly with eyes closed. Respirations even and unlabored. No distress noted. Q 15 minute check continues to maintain safety   

## 2012-06-21 NOTE — Progress Notes (Signed)
[name]  26 y.o.  [id number] [birth year]  06/21/2012   Diagnosis:  Schizoaffective disorder Subjective: I met with Peri and her mother today, and found out that she does not have BC/BS.  This changes the plan of treatment significantly.  Vital Signs:Blood pressure 100/66, pulse 81, temperature 98.3 F (36.8 C), temperature source Oral, resp. rate 16, height 5' 3.5" (1.613 m), weight 64.864 kg (143 lb), last menstrual period 05/14/2012.   Objective:  CM will check with ACT he has referred her to, to see if their medication formulary uses Risperdal as an injectable.   Assessment: Miscommunication.  Medications Scheduled:  . carbamazepine  200 mg Oral BID  . paliperidone  6 mg Oral Daily   PRN Meds acetaminophen, alum & mag hydroxide-simeth, magnesium hydroxide  Plan: 1. Our treatment plan is changing to a different type of injectable as since she has no insurance and the [place] is too expensive. 2. CM will need to insure that Edeline can meet with them as scheduled on Friday. 3. CM will notify us of which injectable (preferably Risperdal) as it is closer in formulation to [place] and we will transition Patricia to this.  Lacinda and her mother both agree to this plan as it will reduce Lloyd Huger T. Marjo Grosvenor [facility] 06/21/2012 1:37 PM

## 2012-06-21 NOTE — Discharge Planning (Signed)
[name] attended AM group.  Was excited about the potential of discharge.  No complaints, and stated she wants to work with the ACT team.  Learned later that she actually does not have insurance, and that we will be switching her meds as a result.  Called Court at [facility] to find out about Haldol or Risperdal as an affordable alternative.  He will talk to the head nurse tomorrow AM and find out which is the preference and get back with me. [name] attended PM group on wellness.  She participated in the exercises.  She did not participate spontaneously, but when asked directly shared that music is a good stress reliever for her.

## 2012-06-22 MED ORDER — PALIPERIDONE PALMITATE 234 MG/1.5ML IM SUSP
234.0000 mg | Freq: Once | INTRAMUSCULAR | Status: AC
  Administered 2012-06-23: 234 mg via INTRAMUSCULAR
  Filled 2012-06-22: qty 1.5

## 2012-06-22 NOTE — Discharge Planning (Signed)
[name] attended AM group.  When I asked her how she was doing, she told me how irritated she was with a smile on her face about not going home yesterday.  I pointed out the confusing message, and she immediately lost the smile.  Went on to ask me about going home today, and I reminded her that we are switching meds and it will not be today.  She got more agitated and talked about how she was now not able to go to school as she had to start class.  Used some sarcasm on me as well, unusual for [name]. [name] attended PM group.  Tracked  What was going on, but did not share.

## 2012-06-22 NOTE — Progress Notes (Signed)
Patient ID: [name], female   DOB: 06-09-1986, 26 y.o.   MRN: [id number]  D:  Pt was pleasant and cooperative, but flat and depressed during the assessment. Informed the writer that she was hoping to go home tomorrow. Stated that she was told to stay apprx 24 hrs to be observed after she receives her invega inj.  Writer informed the pt that she's to receive the inj tomorrow at 0900. And that if what she says is true she may be discharged on Thurs. Pt's affect immediately changed and became sadder. Stated that she doesn't like to be out during halloween. Explained that she has to drive slowly, and be more watchful. Writer explained that normally pts are discharged morning or afternoon and are usually home by evening.  A: Encouragement and support was offered. 15 min checks continued for safety.  R;  Pt remains safe.

## 2012-06-22 NOTE — Progress Notes (Signed)
The focus of this group is to help patients review their daily goal of treatment and discuss progress on daily workbooks. Pt attended the evening group session, but appeared shy and did not want to speak. Pt did eventually respond to the Writer's prompts, but with limited answers of only a few words.

## 2012-06-22 NOTE — Progress Notes (Signed)
D: Patient denies SI/HI/AVH. Patient denies pain and show no s/s of distress. Patient did not attend group this morning. Patient agitated, stating that [name] is ready to leave [facility] and go out into the real world. Patient states [name] doesn't know who to believe the doctor or case manger. Patient states that [name] is trying to leave here so that he can attend school and that there's  a possibility that [name] has a job waiting for her. Patient continues to be paranoid. During interview, patient paranoid about other's listening in on conversation. Patient has flat facial expressions w/continous smiling during conversations. Patient appearance has improved.  A: Patient encouraged to attend groups. Verbal support given. Medications administered as ordered per MD. Shift assessment completed. R: Paranoia. Cooperative to staff. Minimal interaction (in room most of the morning). Agitated.

## 2012-06-22 NOTE — Progress Notes (Signed)
Psychoeducational Group Note  Date:  06/22/2012 Time:  0930  Group Topic/Focus:  Recovery Goals:   The focus of this group is to identify appropriate goals for recovery and establish a plan to achieve them.  Participation Level:  Did Not Attend  Participation Quality:    Affect:    Cognitive:    Insight:    Engagement in Group:    Additional Comments:  none  Raysa Bosak M 06/22/2012, 11:15 AM

## 2012-06-22 NOTE — Progress Notes (Addendum)
[facility] MD Progress Note  06/22/2012 1:54 PM [name]  MRN:  [id number]  Diagnosis:  Schizoaffective disorder  ADL's:  Intact  Sleep: Fair  5.25 hours  Appetite:  Good  Suicidal Ideation:  Denies Homicidal Ideation:  Denies  AEB (Monique evidenced by): Subjective:  [name] is significantly irritated this AM Monique she reports the CM told her that she would be going home this morning.  This is not the case due to the change in coverage and need for consistent medication management with in her range.  CM is waiting for ACT worker to call back regarding the ACT team formulary.  [name] is very frustrated but agrees to wait until further information can be gathered. Mental Status Examination/Evaluation: Objective:  Appearance: Fairly Groomed  Eye Contact::  Good  Speech:  Normal Rate  Volume:  Normal  Mood:  Frustrated and anxious  Affect:  Congruent  Thought Process:  Coherent  Orientation:  Full  Thought Content:  WDL  Suicidal Thoughts:  No  Homicidal Thoughts:  No  Memory:  Immediate;   Poor  Judgement:  Poor  Insight:  Lacking  Psychomotor Activity:  Normal  Concentration:  Poor  Recall:  Poor  Akathisia:  No  Handed:  Right  AIMS (if indicated):     Assets:  Communication Skills Desire for Improvement Housing Physical Health Social Support  Sleep:  Number of Hours: 5.25    Vital Signs:Blood pressure 103/72, pulse 80, temperature 98.3 F (36.8 C), temperature source Oral, resp. rate 20, height 5' 3.5" (1.613 m), weight 64.864 kg (143 lb), last menstrual period 05/14/2012. Current Medications: Current Facility-Administered Medications  Medication Dose Route Frequency Provider Last Rate Last Dose  . acetaminophen (TYLENOL) tablet 650 mg  650 mg Oral Q6H PRN Verne Spurr, PA-C      . alum & mag hydroxide-simeth (MAALOX/MYLANTA) 200-200-20 MG/5ML suspension 30 mL  30 mL Oral Q4H PRN Verne Spurr, PA-C      . carbamazepine (TEGRETOL XR) 12 hr tablet 200 mg  200 mg Oral BID  Mickie D. Adams, PA   200 mg at 06/22/12 0800  . magnesium hydroxide (MILK OF MAGNESIA) suspension 30 mL  30 mL Oral Daily PRN Verne Spurr, PA-C      . paliperidone (INVEGA) 24 hr tablet 6 mg  6 mg Oral Daily Verne Spurr, PA-C   6 mg at 06/22/12 0800  . Paliperidone Palmitate SUSP 234 mg  234 mg Intramuscular Once Verne Spurr, PA-C        Lab Results: No results found for this or any previous visit (from the past 48 hour(s)).  Physical Findings: AIMS: Facial and Oral Movements Muscles of Facial Expression: None, normal Lips and Perioral Area: None, normal Jaw: None, normal Tongue: None, normal,Extremity Movements Upper (arms, wrists, hands, fingers): None, normal Lower (legs, knees, ankles, toes): None, normal, Trunk Movements Neck, shoulders, hips: None, normal, Overall Severity Severity of abnormal movements (highest score from questions above): None, normal Incapacitation due to abnormal movements: None, normal Patient's awareness of abnormal movements (rate only patient's report): No Awareness, Dental Status Current problems with teeth and/or dentures?: No Does patient usually wear dentures?: No  CIWA:    COWS:     Treatment Plan Summary: Daily contact with patient to assess and evaluate symptoms and progress in treatment Medication management  Plan: 1. I have spoken to the ACT worker at [facility], [name] who will ask if a CCA (complete clinical assessment) can be completed in the hospital.  He  will see if this can be done.  After much discussion, regarding the transitioning of [name] off of Invega and Monique to IM Risperdal, she will receive her first dose of [place] here in the hospital. The [facility] formulary does carry Invega and [name] will be able to get her second dose if she can be transitioned into their program ASAP.  2. [name] will continue to take Invega orally to supliment until her 2nd injection.  3. [name] will contact [name] to confirm if a CCA can be done while in the  hospital to insure seemless coverage for [name], if they are unable to do so, then will use [name] Monique a back up. 4. Spent 45-50 minutes with patient and [name], Monique well Monique with Pharm D. Discussing the transition of Sameena to Risperdal injection.  It was decided by the Pharm D., that Hinda Glatter sustena could be ordered by the pharmacy to start the patient while she was in the hospital. 5; Patient, Pharm D, Patient's mother all agree that making sure Carneshia is established with the ACT team and the medicatioh has been initiated prior to her discharge will bridge the looming barrier of Non=compliance if Sonja leaves the hospital Monique oral medication alone. 6, Will wait to hear from ACT worker [name], and Pharm D to acquire medication and discharge tomorrow after getting the injection and shows no side effects. Rona Ravens. Declin Rajan PAC 06/22/2012, 1:54 PM

## 2012-06-23 NOTE — Discharge Planning (Signed)
[name] attended AM group.  She participated appropriately, and expressed optimism that she would be discharge soon.  Did not want to talk about the possibility of staying longer and how she would deal with that.   [name] attended PM group led by Onalee Hua from [facility].  She stayed until she noticed one of her hair extensions had come out.  She excused herself and did not return.

## 2012-06-23 NOTE — Progress Notes (Signed)
Psychoeducational Group Note  Date:  06/23/2012 Time:  1100  Group Topic/Focus:  Crisis Planning:   The purpose of this group is to help patients create a crisis plan for use upon discharge or in the future, as needed.  Participation Level:  Did Not Attend  Participation Quality:    Affect:    Cognitive:    Insight:    Engagement in Group:    Additional Comments:  Pt was sleeping  Suhani Stillion M 06/23/2012, 2:20 PM

## 2012-06-23 NOTE — Progress Notes (Signed)
D: Patient in dayroom with visiting with her mother on approach.  Patient appears happy and is smiling and interacting with her mother.  After patient's mother left patient went to her room and spoke with Clinical research associate.  Patient states she feels much better.  Patient does not appear to be responding to internal stimuli.  Patient denies SI/HI and AVH.  Patient appears clear when she speaks.  Patient states she has to continue to take her medications because it is important.  Patient states she has to make sure there are no pauses with her medications.  Patient states she would like to go back to school but is concerned because she is going to change her major but is unsure what it will be.   A: Staff to monitor Q 15 mins for safety.  Encouragement and support offered.  No medications administered tonight. R: Patient remains safe on the unit.  Patient attended group tonight.  Patient calm and cooperative.

## 2012-06-23 NOTE — Progress Notes (Signed)
Psychoeducational Group Note  Date:  06/23/2012 Time:  2000  Group Topic/Focus:  Wrap-Up Group:   The focus of this group is to help patients review their daily goal of treatment and discuss progress on daily workbooks.  Participation Level:  Active  Participation Quality:  Appropriate  Affect:  Appropriate  Cognitive:  Appropriate  Insight:  Good  Engagement in Group:  Good  Additional Comments: Pt participated in warp-up group this evening. Pt didn't share much in group.    Kimela Malstrom A 06/23/2012, 10:48 PM

## 2012-06-23 NOTE — Progress Notes (Signed)
[facility] MD Progress Note  06/23/2012 11:11 AM [name]  MRN:  [id number]  Diagnosis:  Schizoaffective disorder  ADL's:  Intact  Sleep: Fair 7 hours  Appetite:  Good  Suicidal Ideation:  Denies Homicidal Ideation:  Denies  AEB (as evidenced by): Subjective:  [name] reports she is doing much better today, stated that she slept well last night. She denies delusions or psychosis today. Patient is going to be switched to [place] today and will be observed on the unit for 24 hours. If there is no side effect to medication, patient will be discharged tomorrow. She will be followed by ACT team upon discharge.  Review of Systems  Constitutional: Negative for fever and chills.  Eyes: Negative for blurred vision and double vision.  Respiratory: Negative for cough.   Gastrointestinal: Negative for vomiting and abdominal pain.  Genitourinary: Negative for dysuria.  Musculoskeletal: Negative for myalgias.  Neurological: Negative for dizziness and headaches.    Mental Status Examination/Evaluation: Objective:  Appearance: Fairly Groomed  Eye Contact::  Good  Speech:  Normal Rate  Volume:  Normal  Mood:  Frustrated and anxious  Affect:  Congruent  Thought Process:  Coherent  Orientation:  Full  Thought Content:  WDL  Suicidal Thoughts:  No  Homicidal Thoughts:  No  Memory:  Immediate;   Poor  Judgement:  Poor  Insight:  marginal  Psychomotor Activity:  Normal  Concentration:  Poor  Recall:  Poor  Akathisia:  No  Handed:  Right  AIMS (if indicated):     Assets:  Communication Skills Desire for Improvement Housing Physical Health Social Support  Sleep:  Number of Hours: 6.75    Vital Signs:Blood pressure 99/70, pulse 69, temperature 97.6 F (36.4 C), temperature source Oral, resp. rate 20, height 5' 3.5" (1.613 m), weight 64.864 kg (143 lb), last menstrual period 05/14/2012. Current Medications: Current Facility-Administered Medications  Medication Dose Route  Frequency Provider Last Rate Last Dose  . acetaminophen (TYLENOL) tablet 650 mg  650 mg Oral Q6H PRN Verne Spurr, PA-C      . alum & mag hydroxide-simeth (MAALOX/MYLANTA) 200-200-20 MG/5ML suspension 30 mL  30 mL Oral Q4H PRN Verne Spurr, PA-C      . carbamazepine (TEGRETOL XR) 12 hr tablet 200 mg  200 mg Oral BID Mickie D. Adams, PA   200 mg at 06/23/12 0757  . magnesium hydroxide (MILK OF MAGNESIA) suspension 30 mL  30 mL Oral Daily PRN Verne Spurr, PA-C      . paliperidone (INVEGA) 24 hr tablet 6 mg  6 mg Oral Daily Verne Spurr, PA-C   6 mg at 06/23/12 0757  . Paliperidone Palmitate SUSP 234 mg  234 mg Intramuscular Once Verne Spurr, PA-C        Lab Results: No results found for this or any previous visit (from the past 48 hour(s)).  Physical Findings: AIMS: Facial and Oral Movements Muscles of Facial Expression: None, normal Lips and Perioral Area: None, normal Jaw: None, normal Tongue: None, normal,Extremity Movements Upper (arms, wrists, hands, fingers): None, normal Lower (legs, knees, ankles, toes): None, normal, Trunk Movements Neck, shoulders, hips: None, normal, Overall Severity Severity of abnormal movements (highest score from questions above): None, normal Incapacitation due to abnormal movements: None, normal Patient's awareness of abnormal movements (rate only patient's report): No Awareness, Dental Status Current problems with teeth and/or dentures?: No Does patient usually wear dentures?: No  CIWA:    COWS:     Treatment Plan Summary: Daily contact  with patient to assess and evaluate symptoms and progress in treatment Medication management  Plan: 1. I have spoken to the ACT worker at [facility], [facility] who will ask if a CCA (complete clinical assessment) can be completed in the hospital.  He will see if this can be done.  After much discussion, regarding the transitioning of Richie off of Invega and on to IM Risperdal, she will receive her first dose of [place] here in the hospital. The [facility] formulary does carry Invega and Amyriah will be able to get her second dose if she can be transitioned into their program ASAP.  2. Moyinoluwa will continue to take Invega orally to supliment until her 2nd injection.  3. Court will contact us to confirm if a CCA can be done while in the hospital to insure seemless coverage for Cathaleen, if they are unable to do so, then will use [facility] as a back up. 4. Spent 45-50 minutes with patient and on the phone regarding her ACT team follow up with Court, as well as with Pharm D. Discussing the transition of Azar[place] to Risperdal injection.  It was decided by the Pharm D., that Hinda Glatter sustena could be ordered by the pharmacy to start the patient while she was in the hospital. 5; Patient, Pharm D, Patient's mother all agree that making sure Libbie is established with the ACT team and the medicatioh has been initiated prior to her discharge will bridge the looming barrier of Non-compliance if Mackinzee leaves the hospital on oral medication alone. 6, Will wait to hear from ACT worker Court, and Pharm D to acquire medication and discharge tomorrow after getting the injection and shows no side effects.  Thedore Mins, MD 06/23/2012, 11:11 AM

## 2012-06-23 NOTE — Progress Notes (Signed)
D: Patient denies SI/HI and auditory and visual hallucinations. The patient has an anxious mood and affect. The patient states that she is "ready to go home" and repeatedly states that "she if fine." The patient rates her depression a 0 out of 10 and her hopelessness a 1 out of 10 (1 low/10 high). The patient reports sleeping well and states that her appetite is good and that her energy level is normal. [name] is attending groups, although she is interacting minimally within the milieu.   A: Patient given emotional support from RN. Patient encouraged to come to staff with concerns and/or questions. Patient's medication routine continued. Patient's orders and plan of care reviewed. RN encouraged the patient to participate more in groups and activities  R: Patient remains appropriate and cooperative. Will continue to monitor patient q15 minutes for safety.

## 2012-06-23 NOTE — Treatment Plan (Signed)
Interdisciplinary Treatment Plan Update (Adult)  Date: 06/23/2012  Time Reviewed: 9:59 AM   Progress in Treatment: Attending groups: Yes Participating in groups: Yes Taking medication as prescribed: Yes Tolerating medication: Yes   Family/Significant other contact made:  Yes Patient understands diagnosis:  Yes Discussing patient identified problems/goals with staff:  Yes Medical problems stabilized or resolved:  Yes Denies suicidal/homicidal ideation: Yes  In tx team Issues/concerns per patient self-inventory:  Yes  Requests d/c Other:  New problem(s) identified: N/A  Reason for Continuation of Hospitalization: Medication stabilization  Interventions implemented related to continuation of hospitalization:  Have ordered Gean Birchwood, will d/c 24 hours after administration  Additional comments:    Estimated length of stay: 1 day  Discharge Plan:see below  New goal(s): N/A  Review of initial/current patient goals per problem list:   1.  Goal(s): Eliminate Psychosis  Met:  Yes  Target date:10/30  As evidenced ZO:[id number] report/observation  2.  Goal (s):Identify comprehensive mental wellness plan  Met:  Yes  Target date:10/30  As evidenced EA:[id number] will return home with her mother, follow up with [facility] ACT team  3.  Goal(s):  Met:  Yes  Target date:  As evidenced by:  4.  Goal(s):  Met:  Yes  Target date:  As evidenced by:  Attendees: Patient:  [name] 06/23/2012 9:59 AM  Family:     Physician:  Thedore Mins 06/23/2012 9:59 AM   Nursing:  Carolynn Comment  06/23/2012 9:59 AM   Clinical Social Worker:  Richelle Ito 06/23/2012 9:59 AM   Extender:  Verne Spurr PA 06/23/2012 9:59 AM   Other:     Other:     Other:     Other:      Scribe for Treatment Team:   Ida Rogue, 06/23/2012 9:59 AM

## 2012-06-23 NOTE — Progress Notes (Signed)
  Read and agreed.  Thedore Mins, MD 06/23/2012 11:52 AM

## 2012-06-24 MED ORDER — PALIPERIDONE PALMITATE 156 MG/ML IM SUSP: INTRAMUSCULAR | Status: DC

## 2012-06-24 MED ORDER — CARBAMAZEPINE ER 200 MG PO TB12: 200.0000 mg | ORAL_TABLET | Freq: Two times a day (BID) | ORAL | Status: DC

## 2012-06-24 MED ORDER — PALIPERIDONE ER 6 MG PO TB24: 6.0000 mg | ORAL_TABLET | Freq: Every day | ORAL | Status: DC

## 2012-06-24 NOTE — [facility] Suicide Risk Assessment (Signed)
Suicide Risk Assessment  Discharge Assessment     Demographic Factors:  Female  Mental Status Per Nursing Assessment::   On Admission:  NA  Current Mental Status by Physician: Patient denies suicidal ideations, intent or plan.  Loss Factors: NA  Historical Factors: NA  Risk Reduction Factors:   Positive social support  Continued Clinical Symptoms:  Schizoaffective disorder, Bipolar type  Cognitive Features That Contribute To Risk:  Polarized thinking    Suicide Risk:  Minimal: No identifiable suicidal ideation.  Patients presenting with no risk factors but with morbid ruminations; may be classified as minimal risk based on the severity of the depressive symptoms  Discharge Diagnoses:   AXIS I:  Schizoaffective Disorder AXIS II:  Deferred AXIS III:   Past Medical History  Diagnosis Date   AXIS IV:  other psychosocial or environmental problems, problems related to social environment and problems with access to health care services AXIS V:  61-70 mild symptoms  Plan Of Care/Follow-up recommendations:  Activity:  as tolerated Diet:  healthy Tests:  Tegretol level Other:  Patient fo keep her follow up appointment and takes medications as recommended  Is patient on multiple antipsychotic therapies at discharge:  No   Has Patient had three or more failed trials of antipsychotic monotherapy by history:  No  Recommended Plan for Multiple Antipsychotic Therapies: NA   Digby Groeneveld 06/24/2012, 9:38 AM

## 2012-06-24 NOTE — Progress Notes (Signed)
Pt discharged per MD orders; pt currently denies SI/HI and auditory/visual hallucinations; pt was given education by RN regarding follow-up appointments and medications and pt denied any questions or concerns about these instructions; pt was then escorted to search room to retrieve her belongings by RN before being discharged to hospital lobby. 

## 2012-06-24 NOTE — Progress Notes (Signed)
[facility] Case Management Discharge Plan:  Will you be returning to the same living situation after discharge: Yes,  homewith mother At discharge, do you have transportation home?:Yes,  mother Do you have the ability to pay for your medications:Yes,  ACT team  Interagency Information:     Release of information consent forms completed and in the chart;  Patient's signature needed at discharge.  Patient to Follow up at:  Follow-up Information    Follow up with [facility]. On 06/25/2012. (Arrive by 2:00PM for appt with Leeroy Cha)    Contact information:   [address] [place], [place] [address] [phone] fax [phone]         Patient denies SI/HI:   Yes,  yes    Safety Planning and Suicide Prevention discussed:  Yes,  yes  Barrier to discharge identified:No.  Summary and Recommendations:   [name] 06/24/2012, 9:38 AM

## 2012-06-29 NOTE — Progress Notes (Signed)
Patient Discharge Instructions:  After Visit Summary (AVS):   Faxed to:  06/29/12 Psychiatric Admission Assessment Note:   Faxed to:  06/29/12 Suicide Risk Assessment - Discharge Assessment:   Faxed to:  06/29/12 Faxed/Sent to the Next Level Care provider:  06/29/12 Faxed to [facility] @ [phone]  Jerelene Redden, 06/29/2012, 3:00 PM

## 2012-06-29 NOTE — Discharge Summary (Signed)
Physician Discharge Summary Note  Patient:  [name] is an 26 y.o., female MRN:  [id number] DOB:  10-30-1985 Patient phone:  [phone] (home)  Patient address:   [address] [place] [address]   Date of Admission:  06/13/2012 Date of Discharge: 06/24/2012  Discharge Diagnoses: Active Problems:  Schizoaffective disorder, bipolar type  Non-compliant patient  Axis Diagnosis:  Discharge Diagnoses:  AXIS I: Schizoaffective Disorder  AXIS II: Deferred  AXIS III:  Past Medical History   Diagnosis  Date   AXIS IV: other psychosocial or environmental problems, problems related to social environment and problems with access to health care services  AXIS V: 61-70 mild symptoms  Level of Care:  OP  Hospital Course:  [name] was admitted for crisis management and stabilization for the 3rd time this year in less than 6 months.  She was evaluated and treated for her symptoms of schizoaffective disorder.  Roadblocks to recovery were identified early on to reduce her need for readmission.  Her symptoms were treated with medication management with a goal toward converting her to an injectable medication. This was felt to be the best option to reduce her history of noncompliance and need to be readmitted.  [name] had been opposed to injectable medication in the past, but was open to the idea on this admission.  A plan to have an ACT team follow [name] upon discharge was also developed and inquiries were made to ACT teams in the area.  As she was titrated upward on Invega oral medication a plan was set to have the ACT team interview her here in the hospital to make the transition from in patient to outpatient as seemless and easy as possible.  Unfortunately, just prior to her discharge it was found that [name] did not have BC/BS as [name] were originally informed and the treatment plan had to change to accommodate her need for a more affordable medication.   Efforts were also made  to contact her ACT team [facility] and schedule a face to face meeting in the hospital.  Unfortunately the [facility] team was unable to meet with [name] while in the hospital, but she was scheduled to meet the next day with the initial assessment at 2:30pm.  [name] was able to receive the [place] injection prior to her discharge and had no side effects from this medication.  Medication List  STOP taking these medications         ziprasidone 60 MG capsule   Commonly known as: GEODON      TAKE these medications      Indication    carbamazepine 200 MG 12 hr tablet   Commonly known as: TEGRETOL XR   Take 1 tablet (200 mg total) by mouth 2 (two) times daily.    Indication: Manic-Depression      paliperidone 6 MG 24 hr tablet   Commonly known as: INVEGA   Take 1 tablet (6 mg total) by mouth daily.    Indication: Schizoaffective Disorder      Paliperidone Palmitate 156 MG/ML Susp   Inject 156mg /ml IM susp on November the 6th please!    Indication: Schizoaffective Disorder     Follow-up Information    Follow up with [facility]. On 06/25/2012. (Arrive by 2:00PM for appt with Leeroy Cha)    Contact information:   [address] [place], [place] [address] [phone] fax [phone]   Upon discharge, patient adamantly denies suicidal, homicidal ideations, auditory, visual hallucinations  and or delusional thinking. They left [facility] with all personal belongings via personal transportation in no apparent distress.  Consults:  none  Significant Diagnostic Studies:  Labs: none  Discharge Vitals:   Blood pressure 99/62, pulse 80, temperature 98.3 F (36.8 C), temperature source Oral, resp. rate 16, height 5' 3.5" (1.613 m), weight 64.864 kg (143 lb), last menstrual period 05/14/2012..  Mental Status Exam: See Mental Status Examination and Suicide Risk Assessment completed by Attending Physician prior to discharge.  Discharge destination:  Home  Is patient on multiple  antipsychotic therapies at discharge:  No  Has Patient had three or more failed trials of antipsychotic monotherapy by history: N/A Recommended Plan for Multiple Antipsychotic Therapies: N/A Discharge Orders    Future Orders Please Complete By Expires   Diet - low sodium heart healthy      Increase activity slowly      Discharge instructions      Comments:   Take all of your medications as prescribed.  Be sure to keep ALL follow up appointments as scheduled. This is to ensure getting your refills on time to avoid any interruption in your medication.  If you find that you can not keep your appointment, call the clinic and reschedule. Be sure to tell the nurse if you will need a refill before your appointment.       Medication List     As of 06/29/2012  9:50 AM    STOP taking these medications         ziprasidone 60 MG capsule   Commonly known as: GEODON      TAKE these medications      Indication    carbamazepine 200 MG 12 hr tablet   Commonly known as: TEGRETOL XR   Take 1 tablet (200 mg total) by mouth 2 (two) times daily.    Indication: Manic-Depression      paliperidone 6 MG 24 hr tablet   Commonly known as: INVEGA   Take 1 tablet (6 mg total) by mouth daily.    Indication: Schizoaffective Disorder      Paliperidone Palmitate 156 MG/ML Susp   Inject 156mg /ml IM susp on November the 6th please!    Indication: Schizoaffective Disorder           Follow-up Information    Follow up with [facility]. On 06/25/2012. (Arrive by 2:00PM for appt with Leeroy Cha)    Contact information:   [address] [place], [place] [address] [phone] fax [phone]        Follow-up recommendations:   Activities: Resume typical activities Diet:         Resume typical diet Tests:       None Other: Follow up with outpatient provider and report any side effects to out patient prescriber.  Comments:  Take all your medications as prescribed by your mental  healthcare provider. Report any adverse effects and or reactions from your medicines to your outpatient provider promptly. Patient is instructed and cautioned to not engage in alcohol and or illegal drug use while on prescription medicines. In the event of worsening symptoms, patient is instructed to call the crisis hotline, 911 and or go to the nearest ED for appropriate evaluation and treatment of symptoms. Follow-up with your primary care provider for your other medical issues, concerns and or health care needs.  Signed:  Rona Ravens. [facility] 06/29/2012 9:50 AM

## 2012-06-30 NOTE — Discharge Summary (Signed)
Seen and agreed. Carroll Lingelbach, MD 

## 2012-09-03 ENCOUNTER — Emergency Department (HOSPITAL_COMMUNITY)
Admission: EM | Admit: 2012-09-03 | Discharge: 2012-09-03 | Disposition: A | Discharge status: Home / Self Care | Payer: Self-pay | Attending: Emergency Medicine | Admitting: Emergency Medicine

## 2012-09-03 ENCOUNTER — Encounter (HOSPITAL_COMMUNITY): Payer: Self-pay | Admitting: Emergency Medicine

## 2012-09-03 DIAGNOSIS — F319 Bipolar disorder, unspecified: Secondary | ICD-10-CM | POA: Insufficient documentation

## 2012-09-03 DIAGNOSIS — N926 Irregular menstruation, unspecified: Secondary | ICD-10-CM | POA: Insufficient documentation

## 2012-09-03 DIAGNOSIS — Z79899 Other long term (current) drug therapy: Secondary | ICD-10-CM | POA: Insufficient documentation

## 2012-09-03 DIAGNOSIS — Z3202 Encounter for pregnancy test, result negative: Secondary | ICD-10-CM | POA: Insufficient documentation

## 2012-09-03 LAB — URINALYSIS, ROUTINE W REFLEX MICROSCOPIC
Glucose, UA: NEGATIVE mg/dL
Leukocytes, UA: NEGATIVE
pH: 7 (ref 5.0–8.0)

## 2012-09-03 LAB — URINE MICROSCOPIC-ADD ON

## 2012-09-03 LAB — POCT PREGNANCY, URINE: Preg Test, Ur: NEGATIVE

## 2012-09-03 NOTE — ED Notes (Signed)
Has a vag d/c states that is a different color  lmp  A month or 2 ago  Normally has periods every month denies pain or dysuria

## 2012-09-03 NOTE — ED Notes (Signed)
Sprite given to pt. And family

## 2012-09-03 NOTE — ED Notes (Signed)
Mother at the bedside..  Pt. Resting and watching TV

## 2012-09-04 LAB — GC/CHLAMYDIA PROBE AMP: GC Probe RNA: NEGATIVE

## 2012-09-04 NOTE — ED Provider Notes (Signed)
History     CSN: [id number]  Arrival date & time 09/03/12  1153   First MD Initiated Contact with Patient 09/03/12 1322      Chief Complaint  Patient presents with  . Vaginal Discharge    (Consider location/radiation/quality/duration/timing/severity/associated sxs/prior treatment) Patient is a 27 y.o. female presenting with vaginal discharge. The history is provided by the patient (the pt complains of irregular vaginal bleeding). No language interpreter was used.  Vaginal Discharge This is a new problem. The current episode started 2 days ago. The problem occurs rarely. The problem has not changed since onset.Pertinent negatives include no chest pain, no abdominal pain and no headaches. Nothing aggravates the symptoms. Nothing relieves the symptoms. She has tried nothing for the symptoms. The treatment provided no relief.    Past Medical History  Diagnosis Date  . Bipolar 1 disorder     History reviewed. No pertinent past surgical history.  No family history on file.  History  Substance Use Topics  . Smoking status: Never Smoker   . Smokeless tobacco: Former Neurosurgeon  . Alcohol Use: No     Comment: Pt denies     OB History    Grav Para Term Preterm Abortions TAB SAB Ect Mult Living                  Review of Systems  Constitutional: Negative for fatigue.  HENT: Negative for congestion, sinus pressure and ear discharge.   Eyes: Negative for discharge.  Respiratory: Negative for cough.   Cardiovascular: Negative for chest pain.  Gastrointestinal: Negative for abdominal pain and diarrhea.  Genitourinary: Positive for vaginal discharge. Negative for frequency and hematuria.  Musculoskeletal: Negative for back pain.  Skin: Negative for rash.  Neurological: Negative for seizures and headaches.  Hematological: Negative.   Psychiatric/Behavioral: Negative for hallucinations.    Allergies  Quetiapine  Home Medications   Current Outpatient Rx  Name  Route  Sig   Dispense  Refill  . PALIPERIDONE ER 6 MG PO TB24   Oral   Take 6 mg by mouth daily.           BP 110/68  Pulse 64  Temp 98.8 F (37.1 C)  Resp 16  SpO2 99%  Physical Exam  Constitutional: She is oriented to person, place, and time. She appears well-developed.  HENT:  Head: Normocephalic and atraumatic.  Eyes: Conjunctivae normal and EOM are normal. No scleral icterus.  Neck: Neck supple. No thyromegaly present.  Cardiovascular: Normal rate and regular rhythm.  Exam reveals no gallop and no friction rub.   No murmur heard. Pulmonary/Chest: No stridor. She has no wheezes. She has no rales. She exhibits no tenderness.  Abdominal: She exhibits no distension. There is no tenderness. There is no rebound.  Genitourinary:       Vaginal exam. A small amount of blood in vault  Musculoskeletal: Normal range of motion. She exhibits no edema.  Lymphadenopathy:    She has no cervical adenopathy.  Neurological: She is oriented to person, place, and time. Coordination normal.  Skin: No rash noted. No erythema.  Psychiatric: She has a normal mood and affect. Her behavior is normal.    ED Course  Procedures (including critical care time)  Labs Reviewed  URINALYSIS, ROUTINE W REFLEX MICROSCOPIC - Abnormal; Notable for the following:    Hgb urine dipstick SMALL (*)     All other components within normal limits  URINE MICROSCOPIC-ADD ON - Abnormal; Notable for the following:  Squamous Epithelial / LPF FEW (*)     Bacteria, UA FEW (*)     All other components within normal limits  WET PREP, GENITAL - Abnormal; Notable for the following:    WBC, Wet Prep HPF POC FEW (*)     All other components within normal limits  POCT PREGNANCY, URINE  GC/CHLAMYDIA PROBE AMP   No results found.   1. Irregular menses       MDM          Benny Lennert, MD 09/04/12 [phone]

## 2013-06-04 ENCOUNTER — Emergency Department (INDEPENDENT_AMBULATORY_CARE_PROVIDER_SITE_OTHER)
Admission: EM | Admit: 2013-06-04 | Discharge: 2013-06-04 | Disposition: A | Discharge status: Home / Self Care | Payer: PRIVATE HEALTH INSURANCE | Source: Home / Self Care | Attending: Family Medicine | Admitting: Family Medicine

## 2013-06-04 ENCOUNTER — Encounter (HOSPITAL_COMMUNITY): Payer: Self-pay | Admitting: Emergency Medicine

## 2013-06-04 DIAGNOSIS — T50905A Adverse effect of unspecified drugs, medicaments and biological substances, initial encounter: Secondary | ICD-10-CM

## 2013-06-04 DIAGNOSIS — T887XXA Unspecified adverse effect of drug or medicament, initial encounter: Secondary | ICD-10-CM

## 2013-06-04 LAB — POCT I-STAT, CHEM 8
BUN: 13 mg/dL (ref 6–23)
Chloride: 99 mEq/L (ref 96–112)
Potassium: 3.9 mEq/L (ref 3.5–5.1)
Sodium: 137 mEq/L (ref 135–145)

## 2013-06-04 NOTE — ED Notes (Signed)
Pt c/o poss dehydration Sxs include: dryness of mouth and dryness of vagina?? Reports she is constantly drinking water and Gatorade  Voices no other concerns... Alert w/no signs of acute distress.

## 2013-06-04 NOTE — Discharge Instructions (Signed)
Contact your doctor next week to discuss possibl echange of medication .

## 2013-06-04 NOTE — ED Provider Notes (Signed)
CSN: [id number]     Arrival date & time 06/04/13  1521 History   First MD Initiated Contact with Patient 06/04/13 1548     Chief Complaint  Patient presents with  . Dehydration   (Consider location/radiation/quality/duration/timing/severity/associated sxs/prior Treatment) Patient is a 27 y.o. female presenting with general illness. The history is provided by the patient and a parent.  Illness Severity:  Mild Onset quality:  Gradual Duration:  1 week Chronicity:  New Context:  Dry in mouth and vagina in spite of drinking, no fever, no exertion. Associated symptoms: no diarrhea, no fever, no nausea, no rash and no vomiting     Past Medical History  Diagnosis Date  . Bipolar 1 disorder    History reviewed. No pertinent past surgical history. No family history on file. History  Substance Use Topics  . Smoking status: Never Smoker   . Smokeless tobacco: Former Neurosurgeon  . Alcohol Use: No     Comment: Pt denies    OB History   Grav Para Term Preterm Abortions TAB SAB Ect Mult Living                 Review of Systems  Constitutional: Negative.  Negative for fever.  Gastrointestinal: Negative.  Negative for nausea, vomiting and diarrhea.  Skin: Negative for rash.  Neurological: Negative for dizziness, light-headedness and numbness.    Allergies  Quetiapine  Home Medications   Current Outpatient Rx  Name  Route  Sig  Dispense  Refill  . paliperidone (INVEGA) 6 MG 24 hr tablet   Oral   Take 6 mg by mouth daily.          BP 100/65  Pulse 59  Temp(Src) 98.5 F (36.9 C) (Oral)  Resp 16  SpO2 100%  LMP 05/18/2013 Physical Exam  Nursing note and vitals reviewed. Constitutional: She is oriented to person, place, and time. She appears well-developed and well-nourished. No distress.  HENT:  Mouth/Throat: Oropharynx is clear and moist.  Eyes: Conjunctivae are normal. Pupils are equal, round, and reactive to light.  Neck: Normal range of motion. Neck supple.   Cardiovascular: Normal rate, regular rhythm, normal heart sounds and intact distal pulses.   Pulmonary/Chest: Effort normal and breath sounds normal.  Musculoskeletal: She exhibits no edema and no tenderness.  Lymphadenopathy:    She has no cervical adenopathy.  Neurological: She is alert and oriented to person, place, and time.  Skin: Skin is warm and dry.    ED Course  Procedures (including critical care time) Labs Review Labs Reviewed  POCT I-STAT, CHEM 8 - Abnormal; Notable for the following:    Glucose, Bld 118 (*)    All other components within normal limits   Imaging Review No results found.    MDM  i- stat 8 wnl.    Linna Hoff, MD 06/04/13 [phone]

## 2013-09-30 ENCOUNTER — Ambulatory Visit: Payer: PRIVATE HEALTH INSURANCE | Attending: Internal Medicine

## 2013-11-02 ENCOUNTER — Ambulatory Visit: Payer: Self-pay | Admitting: Internal Medicine

## 2013-11-17 ENCOUNTER — Encounter (HOSPITAL_COMMUNITY): Payer: Self-pay | Admitting: Emergency Medicine

## 2013-11-17 ENCOUNTER — Emergency Department (INDEPENDENT_AMBULATORY_CARE_PROVIDER_SITE_OTHER)
Admission: EM | Admit: 2013-11-17 | Discharge: 2013-11-17 | Disposition: A | Discharge status: Home / Self Care | Payer: 59 | Source: Home / Self Care | Attending: Family Medicine | Admitting: Family Medicine

## 2013-11-17 DIAGNOSIS — N899 Noninflammatory disorder of vagina, unspecified: Secondary | ICD-10-CM

## 2013-11-17 DIAGNOSIS — N898 Other specified noninflammatory disorders of vagina: Secondary | ICD-10-CM

## 2013-11-17 NOTE — Discharge Instructions (Signed)
Thank you for coming in today. Follow up with OBGYN.  [facility] [address] [place], [place] [phone]  [facility] [address] [place], [place] [phone]  [facility]: Silverio Layivard Sandra MD [address] [place], [place] [phone]  [facility] [address] [place], [place] [phone]  [facility]: Marcelle OverlieGrewal Michelle MD [address] [place], [place] [phone]  [facility]: Tracey HarriesHenley Thomas F MD [address] [place], [place] [phone]  [facility] [address] [place], [place] [phone]   [facility]: [address], [place], [place] [address] [phone]

## 2013-11-17 NOTE — ED Provider Notes (Signed)
[name] is a 28 y.o. female who presents to Urgent Care today for vaginal discomfort. Patient has had a few weeks of reported vaginal dryness. She suspects that her Hinda Glatternvega is to blame. She has been on this medication for over 2 years. She notes that the dryness is experienced as irritation and is worsening. She denies any vaginal discharge or dysuria. She has not contacted her  primary care provider or an OB/GYN for this issue. She declines a pelvic exam tonight.   Past Medical History  Diagnosis Date  . Bipolar 1 disorder    History  Substance Use Topics  . Smoking status: Never Smoker   . Smokeless tobacco: Former NeurosurgeonUser  . Alcohol Use: No     Comment: Pt denies    ROS as above Medications: No current facility-administered medications for this encounter.   Current Outpatient Prescriptions  Medication Sig Dispense Refill  . paliperidone (INVEGA) 6 MG 24 hr tablet Take 6 mg by mouth daily.        Exam:  BP 107/68  Pulse 55  Temp(Src) 98.9 F (37.2 C) (Oral)  Resp 18  SpO2 100%  LMP 11/05/2013 Gen: Well NAD Lungs: Normal work of breathing. CTABL Heart: RRR no MRG Abd: NABS, Soft. NT, ND Exts: Brisk capillary refill, warm and well perfused.  Psych: Flat affect  Assessment and Plan: 28 y.o. female with vaginal dryness. Patient declines a pelvic exam. Vaginal dryness is not a commonly reported side effect of this medication. I'm not quite sure what the problem is as I can not perform an exam tonight. Patient would like to see an OB/GYN for this issue. I provided a list of providers.  Discussed warning signs or symptoms. Please see discharge instructions. Patient expresses understanding.    Rodolph BongEvan S Kanchan Gal, MD 11/17/13 [phone]

## 2013-11-17 NOTE — ED Notes (Addendum)
C/o vaginal dryness for 2-3 weeks  States she thinks her medication is making her vagina dry;   Hinda GlatterInvega

## 2013-12-12 ENCOUNTER — Ambulatory Visit: Payer: Self-pay | Admitting: Family Medicine

## 2013-12-26 ENCOUNTER — Encounter: Payer: Self-pay | Admitting: Family Medicine

## 2013-12-26 DIAGNOSIS — Z0289 Encounter for other administrative examinations: Secondary | ICD-10-CM

## 2013-12-26 NOTE — Progress Notes (Signed)
Error   This encounter was created in error - please disregard. 

## 2014-01-03 ENCOUNTER — Encounter (HOSPITAL_COMMUNITY): Payer: Self-pay | Admitting: Emergency Medicine

## 2014-01-03 ENCOUNTER — Emergency Department (INDEPENDENT_AMBULATORY_CARE_PROVIDER_SITE_OTHER)
Admission: EM | Admit: 2014-01-03 | Discharge: 2014-01-03 | Disposition: A | Discharge status: Home / Self Care | Payer: 59 | Source: Home / Self Care

## 2014-01-03 DIAGNOSIS — Z711 Person with feared health complaint in whom no diagnosis is made: Secondary | ICD-10-CM

## 2014-01-03 DIAGNOSIS — R229 Localized swelling, mass and lump, unspecified: Secondary | ICD-10-CM

## 2014-01-03 NOTE — ED Provider Notes (Signed)
CSN: [id number]     Arrival date & time 01/03/14  1530 History   First MD Initiated Contact with Patient 01/03/14 1609     Chief Complaint  Patient presents with  . Mass   (Consider location/radiation/quality/duration/timing/severity/associated sxs/prior Treatment) HPI Comments: 28 y o F with schzoaffective disorder and apparent cognitive disorder st she has a sore lump on her buttock for 2 months. She is accompanied by her mother.   Past Medical History  Diagnosis Date  . Bipolar 1 disorder    History reviewed. No pertinent past surgical history. History reviewed. No pertinent family history. History  Substance Use Topics  . Smoking status: Never Smoker   . Smokeless tobacco: Former NeurosurgeonUser  . Alcohol Use: No     Comment: Pt denies    OB History   Grav Para Term Preterm Abortions TAB SAB Ect Mult Living                 Review of Systems  All other systems reviewed and are negative.   Allergies  Quetiapine  Home Medications   Prior to Admission medications   Medication Sig Start Date End Date Taking? Authorizing Provider  paliperidone (INVEGA) 6 MG 24 hr tablet Take 6 mg by mouth daily. 06/24/12   Verne SpurrNeil Mashburn, PA-C   BP 110/76  Pulse 66  Temp(Src) 98.7 F (37.1 C) (Oral)  Resp 12  SpO2 100% Physical Exam  Nursing note and vitals reviewed. Constitutional: She appears well-developed and well-nourished. No distress.  Neck: Normal range of motion.  Pulmonary/Chest: Effort normal. No respiratory distress.  Genitourinary:  Chaperone Michiel CowboyNancy Stack, RN The buttocks, gluteal cleft and anus was examined and palpated although pt refused DRE. Normal exam . No lumps, bumps, discolorations, tenderness or other abnormal findings.  Musculoskeletal: She exhibits no edema and no tenderness.  Neurological: She is alert. She exhibits normal muscle tone.  Skin: Skin is warm and dry.  Psychiatric: She has a normal mood and affect.    ED Course  Procedures (including critical  care time) Labs Review Labs Reviewed - No data to display  Imaging Review No results found.   MDM   1. Physically well but worried     F/u with your PCP. For new problems worsening, may return     Hayden Rasmussenavid Tyress Loden, NP 01/03/14 1636
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
## 2014-01-03 NOTE — ED Provider Notes (Signed)
Medical screening examination/treatment/procedure(s) were performed by resident physician or non-physician practitioner and as supervising physician I was immediately available for consultation/collaboration.   Hammond Obeirne DOUGLAS MD.   Natoya Viscomi D Lendy Dittrich, MD 01/03/14 1721 

## 2014-03-23 ENCOUNTER — Ambulatory Visit (INDEPENDENT_AMBULATORY_CARE_PROVIDER_SITE_OTHER): Payer: 59 | Admitting: Family Medicine

## 2014-03-23 ENCOUNTER — Encounter: Payer: Self-pay | Admitting: Family Medicine

## 2014-03-23 VITALS — BP 102/60 | HR 76 | Temp 98.9°F | Ht 64.0 in | Wt 188.0 lb

## 2014-03-23 DIAGNOSIS — Z6832 Body mass index (BMI) 32.0-32.9, adult: Secondary | ICD-10-CM

## 2014-03-23 DIAGNOSIS — F259 Schizoaffective disorder, unspecified: Secondary | ICD-10-CM

## 2014-03-23 DIAGNOSIS — Z7689 Persons encountering health services in other specified circumstances: Secondary | ICD-10-CM

## 2014-03-23 DIAGNOSIS — F250 Schizoaffective disorder, bipolar type: Secondary | ICD-10-CM

## 2014-03-23 DIAGNOSIS — Z7189 Other specified counseling: Secondary | ICD-10-CM

## 2014-03-23 DIAGNOSIS — E669 Obesity, unspecified: Secondary | ICD-10-CM

## 2014-03-23 NOTE — Progress Notes (Signed)
No chief complaint on file.   HPI:  Indyah P Quilling is here to establish care. Used to see Dr. Veva Holes - but now has new insurance and did not like prior PCP and reports just wants to start new.  Last PCP and physical: no gyn  Has the following chronic problems and concerns today:  Patient Active Problem List   Diagnosis Date Noted  . Non-compliant patient 06/14/2012    Class: Chronic  . Schizoaffective disorder, bipolar type 03/11/2012   Bipolar Disorder: -managed by Dr. Allyne Gee  -she was hospitalized in the past for this in 2012 -reports she wonders if I could manage her psych medication and would prefer to take supplements for this  Obesity: -reports this is from her psych medications -wonders if subway diet would help -wants referral to nutritionist  ROS negative for unless reported above: fevers, unintentional weight loss, hearing or vision loss, chest pain, palpitations, struggling to breath, hemoptysis, melena, hematochezia, hematuria, falls, loc, si, thoughts of self harm  Past Medical History  Diagnosis Date  . Bipolar 1 disorder     Family History  Problem Relation Age of Onset  . Cancer Father     lymphoma    History   Social History  . Marital Status: Single    Spouse Name: N/A    Number of Children: N/A  . Years of Education: N/A   Social History Main Topics  . Smoking status: Never Smoker   . Smokeless tobacco: Never Used  . Alcohol Use: No     Comment: Pt denies   . Drug Use: No     Comment: Pt denies  . Sexual Activity: No   Other Topics Concern  . None   Social History Narrative   Work or School: taking Physiological scientist; not working      Home Situation: lives with mother      Spiritual Beliefs: Christian      Lifestyle: no regular exercise; healthy diet             Current outpatient prescriptions:paliperidone (INVEGA) 6 MG 24 hr tablet, Take 6 mg by mouth daily., Disp: , Rfl:   EXAM:  Filed Vitals:   03/23/14 1615  BP: 102/60   Pulse: 76  Temp: 98.9 F (37.2 C)    Body mass index is 32.25 kg/(m^2).  GENERAL: vitals reviewed and listed above, alert, oriented, appears well hydrated and in no acute distress  HEENT: atraumatic, conjunttiva clear, no obvious abnormalities on inspection of external nose and ears  NECK: no obvious masses on inspection  LUNGS: clear to auscultation bilaterally, no wheezes, rales or rhonchi, good air movement  CV: HRRR, no peripheral edema  MS: moves all extremities without noticeable abnormality  PSYCH: pleasant and cooperative, flat affect  ASSESSMENT AND PLAN:  Discussed the following assessment and plan:  Obesity, unspecified - Plan: Amb ref to Medical Nutrition Therapy-MNT -discussed importance of healthy diet and at least 150 minutes of cv exercise per week -advised low carb diet with plenty of fresh and/or frozen unprocessed fruits and veggies and adequate amounts of lean meats -she wants extra help with this and referral to nutritionist placed -advised TFT, lipids, hemoglobinA1c at physical  Schizoaffective disorder, bipolar type -advised she discuss any changes of medications with her psychiatrist and be under the care of a psychiatrist -advised I do not manage bipolar disorder and will not be prescribing medications or supplements for this  Encounter to establish care with new doctor  -We reviewed the  PMH, PSH, FH, SH, Meds and Allergies. -We provided refills for any medications we will prescribe as needed. -We addressed current concerns per orders and patient instructions. -We have asked for records for pertinent exams, studies, vaccines and notes from previous providers. -We have advised patient to follow up per instructions below.   -Patient advised to return or notify a doctor immediately if symptoms worsen or persist or new concerns arise.  Patient Instructions  -PLEASE SIGN UP FOR MYCHART TODAY   We recommend the following healthy lifestyle  measures: - eat a healthy diet consisting of lots of vegetables, fruits, beans, nuts, seeds, healthy meats such as white chicken and fish and whole grains.  - avoid fried foods, fast food, processed foods, sodas, red meet and other fattening foods.  - get a least 150 minutes of aerobic exercise per week.   -We placed a referral for you as discussed to the nutritionist. It usually takes about 1-2 weeks to process and schedule this referral. If you have not heard from us regarding this appointment in 2 weeks please contact our office.  Follow up in: CPE with pap - come fasting, drink plenty of water      KIM, HANNAH R.

## 2014-03-23 NOTE — Progress Notes (Signed)
Pre visit review using our clinic review tool, if applicable. No additional management support is needed unless otherwise documented below in the visit note. 

## 2014-03-23 NOTE — Patient Instructions (Addendum)
-  PLEASE SIGN UP FOR MYCHART TODAY   We recommend the following healthy lifestyle measures: - eat a healthy diet consisting of lots of vegetables, fruits, beans, nuts, seeds, healthy meats such as white chicken and fish and whole grains.  - avoid fried foods, fast food, processed foods, sodas, red meet and other fattening foods.  - get a least 150 minutes of aerobic exercise per week.   -We placed a referral for you as discussed to the nutritionist. It usually takes about 1-2 weeks to process and schedule this referral. If you have not heard from us regarding this appointment in 2 weeks please contact our office.  Follow up in: CPE with pap - come fasting, drink plenty of water

## 2014-05-05 ENCOUNTER — Encounter: Payer: Self-pay | Admitting: Family Medicine

## 2014-05-05 NOTE — Progress Notes (Signed)
error    This encounter was created in error - please disregard.

## 2014-05-08 ENCOUNTER — Ambulatory Visit: Payer: PRIVATE HEALTH INSURANCE | Attending: Internal Medicine

## 2014-05-11 ENCOUNTER — Encounter: Payer: 59 | Attending: Family Medicine | Admitting: Dietician

## 2014-05-11 ENCOUNTER — Encounter: Payer: Self-pay | Admitting: Dietician

## 2014-05-11 VITALS — Ht 64.0 in | Wt 184.0 lb

## 2014-05-11 DIAGNOSIS — Z713 Dietary counseling and surveillance: Secondary | ICD-10-CM | POA: Diagnosis not present

## 2014-05-11 DIAGNOSIS — E669 Obesity, unspecified: Secondary | ICD-10-CM | POA: Insufficient documentation

## 2014-05-11 DIAGNOSIS — Z6831 Body mass index (BMI) 31.0-31.9, adult: Secondary | ICD-10-CM | POA: Insufficient documentation

## 2014-05-11 NOTE — Patient Instructions (Addendum)
Start exercising!  Aim for 4 days a week, for 25-30 min at a time.  Try walking or running on the treadmill, listening to music and dancing, walking the stairs, or look up Zumba on YouTube. Practice using MyPlate to eat healthy, balanced meals.  Make 1/4 of your plate grain or starch, 1/4 of your plate lean protein, and fill up 1/2 of your plate with colorful vegetables! Visit [employer] farmers market to get fresh produce. Use MyPlate on a Budget for healthy recipe ideas and tips for eating healthy within each food group.

## 2014-05-11 NOTE — Progress Notes (Signed)
Medical Nutrition Therapy:  Appt start time: 1500 end time:  1545.   Assessment:  Primary concerns today: Raha is referred today for weight management and is here with her mom. She has bipolar disorder and is taking [place] which has a side effect of weight gain.  Patient states her normal weight has always been 115-120 lbs and beginning 2 years ago when she started on her bipolar medication she has gradually gained weight, with current weight today of 184 lbs.  Patient lives with her mom and they both do the food shopping and cooking.  They have budget limitations for food shopping of $100 a month.  They eat out 3 times a month.   Currently patient is following a vegetarian diet, and has been doing this for the past few months with occasional intake of fish.  Mom states she recently ordered a treadmill for her daughter to exercise more.  Patient is engaged and states she wants to learn what to eat to help her lose weight.  Learning Readiness:  Contemplating  MEDICATIONS: see list   DIETARY INTAKE:  24-hr recall:  B ( AM): biscuit and scrambled eggs with OJ or apple juice  Snk ( AM): none  L (2 PM): baked potato, chocolate milk, collard greens, candied yams Snk ( PM): none D ( PM): mashed potatoes, corn, rhutabegga Snk ( PM): none Beverages: bottled water (does not like tap water at home), ovaltine milk, lemonade  Usual physical activity: none currently other than walks the stairs every day, wants to get Shaun-T tapes  Progress Towards Goal(s):  In progress.   Nutritional Diagnosis:  NB-1.1 Food and nutrition-related knowledge deficit As related to no prior education for healthy diet.  As evidenced by patient request for appointment and BMI >30.    Intervention:  Nutrition education for general healthy diet and weight management.  Utilized MyPlate to discuss portion control.  Patient was able to accurately name foods that fit into all of the food groups.  Stated importance of getting  variety and color in the diet and including more non-starchy vegetables in her meals and on her plate.  Discussed benefits of fiber and stated fiber-rich foods.  Provided tips for choosing healthy foods on a budget.  Asked patient what exercises she would enjoy doing.  She was contemplating use of the treadmill.  Suggested activities she could do while on the treadmill such as listening to her R&B or rock music or reading a book.  Stated that any kind of physical activity is appropriate and that she should choose something she enjoys like Zumba or dance.  Patient seemed excited to start exercise and set her own frequency and time goals.  Discussed sources of protein for a vegetarian diet and provided handout.  Recommended switching from 2% milk to 1% milk.  Discussed ways to increase water intake, such as purchasing a water filter so that she can drink tap water at home.  Together we set the following goals:  Start exercising!  Aim for 4 days a week, for 25-30 min at a time.  Try walking or running on the treadmill, listening to music and dancing, walking the stairs, or look up Zumba on YouTube. Practice using MyPlate to eat healthy, balanced meals.  Make 1/4 of your plate grain or starch, 1/4 of your plate lean protein, and fill up 1/2 of your plate with colorful vegetables! Visit [employer] farmers market to get fresh produce. Use MyPlate on a Budget for healthy recipe ideas  and tips for eating healthy within each food group.  Teaching Method Utilized: Visual Auditory  Handouts given during visit include:  MyPlate Portion Method  MyPlate on a Budget packet (tips and recipes)  Vegetarian Proteins  [employer] Coupon  Barriers to learning/adherence to lifestyle change: financial limitations  Demonstrated degree of understanding via:  Teach Back   Monitoring/Evaluation:  Dietary intake, exercise, and body weight in 3 month(s).

## 2014-05-30 ENCOUNTER — Encounter (HOSPITAL_COMMUNITY): Payer: Self-pay | Admitting: Emergency Medicine

## 2014-05-30 ENCOUNTER — Emergency Department (INDEPENDENT_AMBULATORY_CARE_PROVIDER_SITE_OTHER)
Admission: EM | Admit: 2014-05-30 | Discharge: 2014-05-30 | Disposition: A | Discharge status: Home / Self Care | Payer: PRIVATE HEALTH INSURANCE | Source: Home / Self Care | Attending: Emergency Medicine | Admitting: Emergency Medicine

## 2014-05-30 ENCOUNTER — Emergency Department (INDEPENDENT_AMBULATORY_CARE_PROVIDER_SITE_OTHER): Payer: PRIVATE HEALTH INSURANCE

## 2014-05-30 DIAGNOSIS — S82891A Other fracture of right lower leg, initial encounter for closed fracture: Secondary | ICD-10-CM

## 2014-05-30 MED ORDER — IBUPROFEN 800 MG PO TABS
ORAL_TABLET | ORAL | Status: AC
  Filled 2014-05-30: qty 1

## 2014-05-30 MED ORDER — IBUPROFEN 800 MG PO TABS
800.0000 mg | ORAL_TABLET | Freq: Once | ORAL | Status: AC
  Administered 2014-05-30: 800 mg via ORAL

## 2014-05-30 MED ORDER — HYDROCODONE-ACETAMINOPHEN 5-325 MG PO TABS: ORAL_TABLET | ORAL | Status: DC

## 2014-05-30 NOTE — Discharge Instructions (Signed)
Ankle Fracture  A fracture is a break in a bone. The ankle joint is made up of three bones. These include the lower (distal)sections of your lower leg bones, called the tibia and fibula, along with a bone in your foot, called the talus. Depending on how bad the break is and if more than one ankle joint bone is broken, a cast or splint is used to protect and keep your injured bone from moving while it heals. Sometimes, surgery is required to help the fracture heal properly.   There are two general types of fractures:   Stable fracture. This includes a single fracture line through one bone, with no injury to ankle ligaments. A fracture of the talus that does not have any displacement (movement of the bone on either side of the fracture line) is also stable.   Unstable fracture. This includes more than one fracture line through one or more bones in the ankle joint. It also includes fractures that have displacement of the bone on either side of the fracture line.  CAUSES   A direct blow to the ankle.    Quickly and severely twisting your ankle.   Trauma, such as a car accident or falling from a significant height.  RISK FACTORS  You may be at a higher risk of ankle fracture if:   You have certain medical conditions.   You are involved in high-impact sports.   You are involved in a high-impact car accident.  SIGNS AND SYMPTOMS    Tender and swollen ankle.   Bruising around the injured ankle.   Pain on movement of the ankle.   Difficulty walking or putting weight on the ankle.   A cold foot below the site of the ankle injury. This can occur if the blood vessels passing through your injured ankle were also damaged.   Numbness in the foot below the site of the ankle injury.  DIAGNOSIS   An ankle fracture is usually diagnosed with a physical exam and X-rays. A CT scan may also be required for complex fractures.  TREATMENT   Stable fractures are treated with a cast or splint and using crutches to avoid putting  weight on your injured ankle. This is followed by an ankle strengthening program. Some patients require a special type of cast, depending on other medical problems they may have. Unstable fractures require surgery to ensure the bones heal properly. Your health care provider will tell you what type of fracture you have and the best treatment for your condition.  HOME CARE INSTRUCTIONS    Review correct crutch use with your health care provider and use your crutches as directed. Safe use of crutches is extremely important. Misuse of crutches can cause you to fall or cause injury to nerves in your hands or armpits.   Do not put weight or pressure on the injured ankle until directed by your health care provider.   To lessen the swelling, keep the injured leg elevated while sitting or lying down.   Apply ice to the injured area:   Put ice in a plastic bag.   Place a towel between your cast and the bag.   Leave the ice on for 20 minutes, 2-3 times a day.   If you have a plaster or fiberglass cast:   Do not try to scratch the skin under the cast with any objects. This can increase your risk of skin infection.   Check the skin around the cast every day. You   may put lotion on any red or sore areas.   Keep your cast dry and clean.   If you have a plaster splint:   Wear the splint as directed.   You may loosen the elastic around the splint if your toes become numb, tingle, or turn cold or blue.   Do not put pressure on any part of your cast or splint; it may break. Rest your cast only on a pillow the first 24 hours until it is fully hardened.   Your cast or splint can be protected during bathing with a plastic bag sealed to your skin with medical tape. Do not lower the cast or splint into water.   Take medicines as directed by your health care provider. Only take over-the-counter or prescription medicines for pain, discomfort, or fever as directed by your health care provider.   Do not drive a vehicle until  your health care provider specifically tells you it is safe to do so.   If your health care provider has given you a follow-up appointment, it is very important to keep that appointment. Not keeping the appointment could result in a chronic or permanent injury, pain, and disability. If you have any problem keeping the appointment, call the facility for assistance.  SEEK MEDICAL CARE IF:  You develop increased swelling or discomfort.  SEEK IMMEDIATE MEDICAL CARE IF:    Your cast gets damaged or breaks.   You have continued severe pain.   You develop new pain or swelling after the cast was put on.   Your skin or toenails below the injury turn blue or gray.   Your skin or toenails below the injury feel cold, numb, or have loss of sensitivity to touch.   There is a bad smell or pus draining from under the cast.  MAKE SURE YOU:    Understand these instructions.   Will watch your condition.   Will get help right away if you are not doing well or get worse.  Document Released: 08/08/2000 Document Revised: 08/16/2013 Document Reviewed: 03/10/2013  ExitCare Patient Information 2015 [employer]. This information is not intended to replace advice given to you by your health care provider. Make sure you discuss any questions you have with your health care provider.

## 2014-05-30 NOTE — ED Provider Notes (Signed)
Chief Complaint   Ankle Pain   History of Present Illness   Cartha P [name] is a 28 year old female who was walking down a slope and a step today at a store. She twisted her right ankle. She did not hear a pop, but thereafter the ankle became swollen she was not able to bear weight. She localizes the pain over the lateral malleolus. It hurts to touch, hurts to move, and she's not able to bear any weight on the foot. She denies any numbness or tingling.  Review of Systems   Other than as noted above, the patient denies any of the following symptoms: Systemic:  No fevers or chills.   Musculoskeletal:  No joint pain or swelling, back pain, or neck pain. Neurological:  No muscular weakness or paresthesias.  PMFSH   Past medical history, family history, social history, meds, and allergies were reviewed. She has bipolar disorder and takes [place]vega.  Physical Examination     Vital signs:  BP 111/76  Pulse 64  Temp(Src) 98.4 F (36.9 C) (Oral)  Resp 18  SpO2 97%  LMP 05/30/2014 Gen:  Alert and oriented times 3.  In no distress. Musculoskeletal: Exam of the ankle reveals there is tenderness to palpation swelling over the lateral malleolus. No deformity or bruising. Anterior drawer sign negative.  Talar tilt negative. Squeeze test negative. Achilles tendon, peroneal tendon, and tibialis posterior were intact. Otherwise, all joints had a full a ROM with no swelling, bruising or deformity.  No edema, pulses full. Extremities were warm and pink.  Capillary refill was brisk.  Skin:  Clear, warm and dry.  No rash. Neuro:  Alert and oriented times 3.  Muscle strength was normal.  Sensation was intact to light touch.   Radiology   Dg Ankle Complete Right  05/30/2014   CLINICAL DATA:  Twisting injury to the right ankle while going down stairs at a stool were. Initial encounter.  EXAM: RIGHT ANKLE - COMPLETE 3+ VIEW  COMPARISON:  None.  FINDINGS: Lateral soft tissue swelling. Bone fragment adjacent to  the lateral talus, likely representing an avulsion fracture. No fractures elsewhere. Ankle mortise intact with well preserved joint space. Well preserved bone mineral density. No visible joint effusion or hemarthrosis.  IMPRESSION: Avulsion fracture arising from the lateral talus at the insertion of the lateral ligament complex. No other fractures.   Electronically Signed   By: Hulan Saashomas  Lawrence M.D.   On: 05/30/2014 20:24     I reviewed the images independently and personally and concur with the radiologist's findings.  Course in Urgent Care Center   The following medications were given:  Medications  ibuprofen (ADVIL,MOTRIN) tablet 800 mg (800 mg Oral Given 05/30/14 2020)    She was placed in a Cam Walker boot and given crutches for ambulation. She was made nonweightbearing.   Assessment   The encounter diagnosis was Ankle fracture, right, closed, initial encounter.  This is a minimal avulsion fracture, is the equivalent of a sprain. Should heal up within 6 weeks. Will need followup with orthopedics.  Plan     1.  Meds:  The following meds were prescribed:   Discharge Medication List as of 05/30/2014  8:40 PM    START taking these medications   Details  HYDROcodone-acetaminophen (NORCO/VICODIN) 5-325 MG per tablet 1 to 2 tabs every 4 to 6 hours as needed for pain., Print        2.  Patient Education/Counseling:  The patient was given appropriate handouts, self care  instructions, including rest and activity, elevation, application of ice and compression, and instructed in pain control.  She should wear the Cam Walker boot whenever she stop and use crutches and be nonweightbearing.  3.  Follow up:  The patient was told to follow up here if no better in 3 to 4 days, or sooner if becoming worse in any way, and given some red flag symptoms such as increasing pain or neurological symptoms which would prompt immediate return.  Follow up with Dr. Roda Shutters in one week.     Reuben Likes,  MD 05/30/14 2110

## 2014-05-30 NOTE — ED Notes (Signed)
Ankle pain, right ankle.  Reports walking today on a slope and at a step and mis stepped, landing on right side.  Pain in right ankle and reports abrasion to left knee.

## 2014-06-21 ENCOUNTER — Other Ambulatory Visit (HOSPITAL_COMMUNITY)
Admission: RE | Admit: 2014-06-21 | Discharge: 2014-06-21 | Disposition: A | Discharge status: Home / Self Care | Payer: 59 | Source: Ambulatory Visit | Attending: Family Medicine | Admitting: Family Medicine

## 2014-06-21 ENCOUNTER — Ambulatory Visit (INDEPENDENT_AMBULATORY_CARE_PROVIDER_SITE_OTHER): Payer: PRIVATE HEALTH INSURANCE | Admitting: Family Medicine

## 2014-06-21 ENCOUNTER — Encounter: Payer: Self-pay | Admitting: Family Medicine

## 2014-06-21 VITALS — BP 104/70 | HR 75 | Temp 98.7°F | Ht 62.25 in | Wt 185.3 lb

## 2014-06-21 DIAGNOSIS — Z0000 Encounter for general adult medical examination without abnormal findings: Secondary | ICD-10-CM

## 2014-06-21 DIAGNOSIS — Z01419 Encounter for gynecological examination (general) (routine) without abnormal findings: Secondary | ICD-10-CM | POA: Insufficient documentation

## 2014-06-21 DIAGNOSIS — Z1151 Encounter for screening for human papillomavirus (HPV): Secondary | ICD-10-CM | POA: Diagnosis present

## 2014-06-21 DIAGNOSIS — Z3200 Encounter for pregnancy test, result unknown: Secondary | ICD-10-CM

## 2014-06-21 DIAGNOSIS — Z124 Encounter for screening for malignant neoplasm of cervix: Secondary | ICD-10-CM

## 2014-06-21 DIAGNOSIS — E669 Obesity, unspecified: Secondary | ICD-10-CM

## 2014-06-21 LAB — LIPID PANEL
CHOL/HDL RATIO: 5
Cholesterol: 171 mg/dL (ref 0–200)
HDL: 34.6 mg/dL — ABNORMAL LOW (ref >39.00)
LDL CALC: 100 mg/dL — AB (ref 0–99)
NONHDL: 136.4
Triglycerides: 181 mg/dL — ABNORMAL HIGH (ref 0.0–149.0)
VLDL: 36.2 mg/dL (ref 0.0–40.0)

## 2014-06-21 LAB — TSH: TSH: 3.21 u[IU]/mL (ref 0.35–4.50)

## 2014-06-21 LAB — POCT URINE PREGNANCY: PREG TEST UR: NEGATIVE

## 2014-06-21 LAB — HEMOGLOBIN A1C: Hgb A1c MFr Bld: 5.4 % (ref 4.6–6.5)

## 2014-06-21 NOTE — Patient Instructions (Addendum)
BEFORE YOU LEAVE: -Tdap -labs  -We have ordered labs or studies at this visit. It can take up to 1-2 weeks for results and processing. We will contact you with instructions IF your results are abnormal. Normal results will be released to [facility]. If you have not heard from us or can not find your results in [facility] in 2 weeks please contact our office.  We recommend the following healthy lifestyle measures: - eat a healthy diet consisting of lots of vegetables, fruits, beans, nuts, seeds, healthy meats such as white chicken and fish and whole grains.  - avoid fried foods, fast food, processed foods, sodas, red meet and other fattening foods.  - get a least 150 minutes of aerobic exercise per week once orthopedic doctor has approved for you to start exercising again

## 2014-06-21 NOTE — Progress Notes (Signed)
Pre visit review using our clinic review tool, if applicable. No additional management support is needed unless otherwise documented below in the visit note. 

## 2014-06-21 NOTE — Progress Notes (Signed)
No chief complaint on file.   HPI:  Here for CPE:  -Concerns and/or follow up today: none  Schizoaffective disorder/bipolar disorder: -sees psych for this, [facility] -on invega  -Diet: variety of foods, balance and well rounded - she is working on improving  -Exercise: regular exercise - Fractured R ankle a few weeks ago, seeing ortho for this - Dr. Rushie Chestnutxu  -Taking folic acid, vitamin D or calcium: no  -Diabetes and Dyslipidemia Screening: fasting  -Hx of HTN: no  -Vaccines: flu vaccine - declined, Wants tdap  -pap history: denies a hx of abnormal apa, but thinks has been at least 3 years since last pap  -FDLMP: not sure, thinks 2 months ago  -sexual activity: yes, female partner, no new partners  -wants STI testing: no   -Alcohol, Tobacco, drug use: see social history  Review of Systems - no fevers, unintentional weight loss, vision loss, hearing loss, chest pain, sob, hemoptysis, melena, hematochezia, hematuria, genital discharge, changing or concerning skin lesions, bleeding, bruising, loc, thoughts of self harm or SI  Past Medical History  Diagnosis Date  . Bipolar 1 disorder     No past surgical history on file.  Family History  Problem Relation Age of Onset  . Cancer Father     lymphoma    History   Social History  . Marital Status: Single    Spouse Name: N/A    Number of Children: N/A  . Years of Education: N/A   Social History Main Topics  . Smoking status: Never Smoker   . Smokeless tobacco: Never Used  . Alcohol Use: No     Comment: Pt denies   . Drug Use: No     Comment: Pt denies  . Sexual Activity: No   Other Topics Concern  . None   Social History Narrative   Work or School: taking Physiological scientistculinary class; not working      Home Situation: lives with mother      Spiritual Beliefs: Christian      Lifestyle: no regular exercise; healthy diet             Current outpatient prescriptions:paliperidone (INVEGA) 6 MG 24 hr tablet, Take 6 mg by  mouth daily., Disp: , Rfl:   EXAM:  Filed Vitals:   06/21/14 1055  BP: 104/70  Pulse: 75  Temp: 98.7 F (37.1 C)    GENERAL: vitals reviewed and listed below, alert, oriented, appears well hydrated and in no acute distress  HEENT: head atraumatic, PERRLA, normal appearance of eyes, ears, nose and mouth. moist mucus membranes.  NECK: supple, no masses or lymphadenopathy  LUNGS: clear to auscultation bilaterally, no rales, rhonchi or wheeze  CV: HRRR, no peripheral edema or cyanosis, normal pedal pulses  BREAST: normal appearance - no lesions or discharge, on palpation normal breast tissue without any suspicious masses  GU: normal appearance of external genitalia - no lesions or masses, normal vaginal mucosa - no abnormal discharge, normal appearance of cervix - no lesions or abnormal discharge, pap obtained - bimanual not done due to pt preference  RECTAL: refused  SKIN: no rash or abnormal lesions  MS: normal gait, moves all extremities normally  NEURO: CN II-XII grossly intact, normal muscle strength and sensation to light touch on extremities  PSYCH: normal affect, pleasant and cooperative  ASSESSMENT AND PLAN:  Discussed the following assessment and plan:  There are no diagnoses linked to this encounter.  -Discussed and advised all [place] preventive services health task force level  A and B recommendations for age, sex and risks.  -Advised at least 150 minutes of exercise per week and a healthy diet low in saturated fats and sweets and consisting of fresh fruits and vegetables, lean meats such as fish and white chicken and whole grains.  -urine preg neg  -FASTING labs, studies and vaccines per orders this encounter  -when assistant offered Tdap she refused  Orders Placed This Encounter  Procedures  . Hemoglobin A1c  . Lipid Panel  . TSH    Patient advised to return to clinic immediately if symptoms worsen or persist or new concerns.  Patient Instructions   BEFORE YOU LEAVE: -Tdap -labs  -We have ordered labs or studies at this visit. It can take up to 1-2 weeks for results and processing. We will contact you with instructions IF your results are abnormal. Normal results will be released to your [facility]. If you have not heard from us or can not find your results in [facility] in 2 weeks please contact our office.  We recommend the following healthy lifestyle measures: - eat a healthy diet consisting of lots of vegetables, fruits, beans, nuts, seeds, healthy meats such as white chicken and fish and whole grains.  - avoid fried foods, fast food, processed foods, sodas, red meet and other fattening foods.  - get a least 150 minutes of aerobic exercise per week once orthopedic doctor has approved for you to start exercising again     Return in about 1 year (around 06/22/2015), or if symptoms worsen or fail to improve.  Kriste BasqueKIM, Kimberla Driskill R.

## 2014-06-21 NOTE — Addendum Note (Signed)
Addended by: Johnella MoloneyFUNDERBURK, Deaundra Kutzer A on: 06/21/2014 11:48 AM   Modules accepted: Orders

## 2014-06-22 LAB — CYTOLOGY - PAP

## 2014-07-22 ENCOUNTER — Encounter (HOSPITAL_COMMUNITY): Payer: Self-pay | Admitting: *Deleted

## 2014-07-22 ENCOUNTER — Emergency Department (HOSPITAL_COMMUNITY)
Admission: EM | Admit: 2014-07-22 | Discharge: 2014-07-24 | Disposition: A | Discharge status: Other Acute Inpatient Hospital | Payer: PRIVATE HEALTH INSURANCE | Attending: Emergency Medicine | Admitting: Emergency Medicine

## 2014-07-22 DIAGNOSIS — Z9114 Patient's other noncompliance with medication regimen: Secondary | ICD-10-CM | POA: Diagnosis not present

## 2014-07-22 DIAGNOSIS — Z79899 Other long term (current) drug therapy: Secondary | ICD-10-CM | POA: Insufficient documentation

## 2014-07-22 DIAGNOSIS — Z9119 Patient's noncompliance with other medical treatment and regimen: Secondary | ICD-10-CM

## 2014-07-22 DIAGNOSIS — F319 Bipolar disorder, unspecified: Secondary | ICD-10-CM | POA: Diagnosis not present

## 2014-07-22 DIAGNOSIS — Z3202 Encounter for pregnancy test, result negative: Secondary | ICD-10-CM | POA: Insufficient documentation

## 2014-07-22 DIAGNOSIS — Z91199 Patient's noncompliance with other medical treatment and regimen due to unspecified reason: Secondary | ICD-10-CM

## 2014-07-22 DIAGNOSIS — F250 Schizoaffective disorder, bipolar type: Secondary | ICD-10-CM | POA: Diagnosis not present

## 2014-07-22 DIAGNOSIS — F22 Delusional disorders: Secondary | ICD-10-CM

## 2014-07-22 DIAGNOSIS — Z008 Encounter for other general examination: Secondary | ICD-10-CM | POA: Diagnosis present

## 2014-07-22 DIAGNOSIS — E010 Iodine-deficiency related diffuse (endemic) goiter: Secondary | ICD-10-CM | POA: Diagnosis not present

## 2014-07-22 DIAGNOSIS — Z7982 Long term (current) use of aspirin: Secondary | ICD-10-CM | POA: Diagnosis not present

## 2014-07-22 LAB — CBC WITH DIFFERENTIAL/PLATELET
BASOS PCT: 1 % (ref 0–1)
Basophils Absolute: 0.1 10*3/uL (ref 0.0–0.1)
EOS ABS: 0.2 10*3/uL (ref 0.0–0.7)
EOS PCT: 3 % (ref 0–5)
HCT: 38.3 % (ref 36.0–46.0)
HEMOGLOBIN: 12.9 g/dL (ref 12.0–15.0)
Lymphocytes Relative: 38 % (ref 12–46)
Lymphs Abs: 2 10*3/uL (ref 0.7–4.0)
MCH: 27.2 pg (ref 26.0–34.0)
MCHC: 33.7 g/dL (ref 30.0–36.0)
MCV: 80.8 fL (ref 78.0–100.0)
MONO ABS: 0.4 10*3/uL (ref 0.1–1.0)
MONOS PCT: 7 % (ref 3–12)
Neutro Abs: 2.7 10*3/uL (ref 1.7–7.7)
Neutrophils Relative %: 51 % (ref 43–77)
Platelets: 232 10*3/uL (ref 150–400)
RBC: 4.74 MIL/uL (ref 3.87–5.11)
RDW: 13.1 % (ref 11.5–15.5)
WBC: 5.2 10*3/uL (ref 4.0–10.5)

## 2014-07-22 LAB — URINE MICROSCOPIC-ADD ON

## 2014-07-22 LAB — RAPID URINE DRUG SCREEN, HOSP PERFORMED
AMPHETAMINES: NOT DETECTED
BENZODIAZEPINES: NOT DETECTED
Barbiturates: NOT DETECTED
Cocaine: NOT DETECTED
Opiates: NOT DETECTED
Tetrahydrocannabinol: NOT DETECTED

## 2014-07-22 LAB — COMPREHENSIVE METABOLIC PANEL
ALBUMIN: 3.9 g/dL (ref 3.5–5.2)
ALT: 11 U/L (ref 0–35)
AST: 18 U/L (ref 0–37)
Alkaline Phosphatase: 59 U/L (ref 39–117)
Anion gap: 12 (ref 5–15)
BUN: 17 mg/dL (ref 6–23)
CALCIUM: 9.5 mg/dL (ref 8.4–10.5)
CO2: 25 mEq/L (ref 19–32)
CREATININE: 0.79 mg/dL (ref 0.50–1.10)
Chloride: 99 mEq/L (ref 96–112)
GFR calc Af Amer: >90 mL/min (ref >90)
GFR calc non Af Amer: >90 mL/min (ref >90)
Glucose, Bld: 107 mg/dL — ABNORMAL HIGH (ref 70–99)
Potassium: 4.2 mEq/L (ref 3.7–5.3)
Sodium: 136 mEq/L — ABNORMAL LOW (ref 137–147)
TOTAL PROTEIN: 7.9 g/dL (ref 6.0–8.3)
Total Bilirubin: <0.2 mg/dL — ABNORMAL LOW (ref 0.3–1.2)

## 2014-07-22 LAB — ACETAMINOPHEN LEVEL: Acetaminophen (Tylenol), Serum: <15 ug/mL (ref 10–30)

## 2014-07-22 LAB — PREGNANCY, URINE: PREG TEST UR: NEGATIVE

## 2014-07-22 LAB — URINALYSIS, ROUTINE W REFLEX MICROSCOPIC
Bilirubin Urine: NEGATIVE
GLUCOSE, UA: NEGATIVE mg/dL
KETONES UR: NEGATIVE mg/dL
Leukocytes, UA: NEGATIVE
Nitrite: NEGATIVE
Protein, ur: NEGATIVE mg/dL
Specific Gravity, Urine: 1.015 (ref 1.005–1.030)
Urobilinogen, UA: 0.2 mg/dL (ref 0.0–1.0)
pH: 5.5 (ref 5.0–8.0)

## 2014-07-22 LAB — WET PREP, GENITAL
Clue Cells Wet Prep HPF POC: NONE SEEN
Trich, Wet Prep: NONE SEEN
Yeast Wet Prep HPF POC: NONE SEEN

## 2014-07-22 LAB — SALICYLATE LEVEL

## 2014-07-22 LAB — RPR

## 2014-07-22 MED ORDER — ONDANSETRON HCL 4 MG PO TABS: 4.0000 mg | ORAL_TABLET | Freq: Three times a day (TID) | ORAL | Status: DC | PRN

## 2014-07-22 MED ORDER — PALIPERIDONE ER 6 MG PO TB24
6.0000 mg | ORAL_TABLET | Freq: Every day | ORAL | Status: DC
  Filled 2014-07-22: qty 1

## 2014-07-22 MED ORDER — ACETAMINOPHEN 325 MG PO TABS: 650.0000 mg | ORAL_TABLET | ORAL | Status: DC | PRN

## 2014-07-22 MED ORDER — ZOLPIDEM TARTRATE 5 MG PO TABS
5.0000 mg | ORAL_TABLET | Freq: Every evening | ORAL | Status: DC | PRN
  Filled 2014-07-22: qty 1

## 2014-07-22 MED ORDER — IBUPROFEN 200 MG PO TABS: 600.0000 mg | ORAL_TABLET | Freq: Three times a day (TID) | ORAL | Status: DC | PRN

## 2014-07-22 MED ORDER — NICOTINE 21 MG/24HR TD PT24
21.0000 mg | MEDICATED_PATCH | Freq: Every day | TRANSDERMAL | Status: DC
  Filled 2014-07-22: qty 1

## 2014-07-22 MED ORDER — LORAZEPAM 1 MG PO TABS
1.0000 mg | ORAL_TABLET | Freq: Three times a day (TID) | ORAL | Status: DC | PRN
  Administered 2014-07-24: 1 mg via ORAL
  Filled 2014-07-22 (×2): qty 1

## 2014-07-22 MED ORDER — ALUM & MAG HYDROXIDE-SIMETH 200-200-20 MG/5ML PO SUSP: 30.0000 mL | ORAL | Status: DC | PRN

## 2014-07-22 NOTE — ED Notes (Signed)
Pt is currently eating and will obtain vitals after pt is done eating.

## 2014-07-22 NOTE — ED Notes (Signed)
TTS at bedside. 

## 2014-07-22 NOTE — ED Provider Notes (Signed)
CSN: [id number]     Arrival date & time 07/22/14  1237 History   First MD Initiated Contact with Patient 07/22/14 1311     Chief Complaint  Patient presents with  . vaginal dryness      (Consider location/radiation/quality/duration/timing/severity/associated sxs/prior Treatment) HPI  [name] is a 28 y.o. female complaining of vaginal dryness which she relates to her psychiatric medications which she says she is compliant with. Patient also states that she is bruised whenever she is around her sisters. She does not recall any specific trauma. She denies abuse or sexual assault. He states that she has prayed over her house and her sister and her sister's child could not come into the house. Will not respond directly when asked about suicidal ideation but states that "I need to talk to somebody about that." Patient denies homicidal ideation, auditory or visual hallucinations however when pressed further she states that she screams because sometimes she feels that there is :something on her." Patient denies any drug or alcohol abuse.  Past Medical History  Diagnosis Date  . Bipolar 1 disorder    History reviewed. No pertinent past surgical history. Family History  Problem Relation Age of Onset  . Cancer Father     lymphoma   History  Substance Use Topics  . Smoking status: Never Smoker   . Smokeless tobacco: Never Used  . Alcohol Use: No     Comment: Pt denies    OB History    No data available     Review of Systems  Unable to perform ROS: Psychiatric disorder      Allergies  Quetiapine  Home Medications   Prior to Admission medications   Medication Sig Start Date End Date Taking? Authorizing Provider  Paliperidone Palmitate (INVEGA SUSTENNA) 117 MG/0.75ML SUSP Inject 117 mg into the muscle.   Yes Historical Provider, MD  paliperidone (INVEGA) 6 MG 24 hr tablet Take 6 mg by mouth daily. 06/24/12   Verne SpurrNeil Mashburn, PA-C   BP 127/76 mmHg  Pulse 80  Temp(Src) 98.4 F  (36.9 C) (Oral)  Resp 21  SpO2 99% Physical Exam  Constitutional: She is oriented to person, place, and time. She appears well-developed and well-nourished. No distress.  HENT:  Head: Normocephalic and atraumatic.  Eyes: Conjunctivae and EOM are normal. Pupils are equal, round, and reactive to light.  Neck: Normal range of motion. Thyromegaly present.  Cardiovascular: Normal rate, regular rhythm and intact distal pulses.   Pulmonary/Chest: Effort normal and breath sounds normal. No stridor.  Abdominal: Soft. Bowel sounds are normal.  Genitourinary:  Pelvic exam chaperoned by technician: No rashes or lesions, no abnormal vaginal discharge, no cervical or adnexal tenderness palpation.  Musculoskeletal: Normal range of motion.  Neurological: She is alert and oriented to person, place, and time.  Psychiatric: Her affect is blunt. Her speech is tangential. She is slowed and withdrawn. Thought content is paranoid. She expresses no homicidal and no suicidal ideation. She is noncommunicative.  Seems to be responding to internal stimuli,  She is inattentive.  Nursing note and vitals reviewed.   ED Course  Procedures (including critical care time) Labs Review Labs Reviewed  WET PREP, GENITAL - Abnormal; Notable for the following:    WBC, Wet Prep HPF POC FEW (*)    All other components within normal limits  URINALYSIS, ROUTINE W REFLEX MICROSCOPIC - Abnormal; Notable for the following:    Hgb urine dipstick SMALL (*)    All other components within normal limits  SALICYLATE LEVEL - Abnormal; Notable for the following:    Salicylate Lvl <2.0 (*)    All other components within normal limits  COMPREHENSIVE METABOLIC PANEL - Abnormal; Notable for the following:    Sodium 136 (*)    Glucose, Bld 107 (*)    Total Bilirubin <0.2 (*)    All other components within normal limits  URINE MICROSCOPIC-ADD ON - Abnormal; Notable for the following:    Squamous Epithelial / LPF FEW (*)    Bacteria, UA  FEW (*)    All other components within normal limits  GC/CHLAMYDIA PROBE AMP  URINE RAPID DRUG SCREEN (HOSP PERFORMED)  ACETAMINOPHEN LEVEL  CBC WITH DIFFERENTIAL  PREGNANCY, URINE  RPR  HIV ANTIBODY (ROUTINE TESTING)    Imaging Review No results found.   EKG Interpretation None      MDM   Final diagnoses:  Paranoia  Schizoaffective disorder, bipolar type    Filed Vitals:   07/22/14 1300  BP: 127/76  Pulse: 80  Temp: 98.4 F (36.9 C)  TempSrc: Oral  Resp: 21  SpO2: 99%    Medications  alum & mag hydroxide-simeth (MAALOX/MYLANTA) 200-200-20 MG/5ML suspension 30 mL (not administered)  ondansetron (ZOFRAN) tablet 4 mg (not administered)  nicotine (NICODERM CQ - dosed in mg/24 hours) patch 21 mg (21 mg Transdermal Not Given 07/22/14 1628)  zolpidem (AMBIEN) tablet 5 mg (not administered)  ibuprofen (ADVIL,MOTRIN) tablet 600 mg (not administered)  acetaminophen (TYLENOL) tablet 650 mg (not administered)  LORazepam (ATIVAN) tablet 1 mg (not administered)    Pierce P Reino [name] is a 28 y.o. female presenting with paranoia and psychotic features of word salad and possible hallucinations. States she's been compliant with her psychiatric medications.   Patient is medically cleared for psychiatric evaluation will be transferred to the psych ED. TTS consulted, home meds and psych standard holding orders placed.    Wynetta Emeryicole Hodge Stachnik, PA-C 07/22/14 1814  Lyanne CoKevin M Campos, MD 07/23/14 [phone]

## 2014-07-22 NOTE — ED Notes (Signed)
Pt changed into paper scrubs.

## 2014-07-22 NOTE — ED Notes (Signed)
Pt spent close to 20 minutes in the bathroom but could not provide a urine sample.

## 2014-07-22 NOTE — ED Notes (Signed)
Pt is refusing to answer questions, sts she will only talk "confidentially to doctor". She did however mentioned that her medications are causing vaginal dryness and that someone "in my ACT team is abusing me"

## 2014-07-22 NOTE — BH Assessment (Signed)
Assessment Note  [name] is an 28 y.o. female. [facility] mother because of patient believing things were crawling on her which caused her to scream but mother says nothing was on her.  Patient currently denies SI/HI, hallucinations, and other self-injurious behaviors.  Patient reports that she do not understand the reason she is here and wondering if she was disturbing the peace.  "I am wondering what I did wrong". Patient reports she is compliant with [facility] ACTT team and her medications.  Patient presents with thought blocking, responding to internal stimuli, suspicious, and irritability.    CSW spoke with the patient's mother to collect collateral information.  She report that the patient called 911 this morning requesting [name] lie detector test and they came to the home but instructed her to google for options.  The mother called 911 later this day after the patient screamed thinking things were crawling on her.  She reports they spoke with the ACTT team but she was bought into the ED before anyone could see the patient.    CSW spoke with Herbert SetaHeather with [facility] [phone] to collect collateral information.  She reports that the patient was seen on 07/19/2014 to receive her IM invega 156mg  medications.  The patient has been more irritable and confrontational than normal.  Patient is refusing to leave her home and increased paranoia.  The patient will not take PO medications therefore changing her medications has been difficult.    CSW consulted with Catha NottinghamJamison, NP it is recommended to re-evaluate in the AM by psychiatry.    Axis I: Schizoaffective Disorder Axis II: Deferred Axis III:  Past Medical History  Diagnosis Date  . Bipolar 1 disorder    Axis IV: problems related to social environment, problems with access to health care services and problems with primary support group Axis V: 41-50 serious symptoms  Past Medical History:  Past Medical  History  Diagnosis Date  . Bipolar 1 disorder     History reviewed. No pertinent past surgical history.  Family History:  Family History  Problem Relation Age of Onset  . Cancer Father     lymphoma    Social History:  reports that she has never smoked. She has never used smokeless tobacco. She reports that she does not drink alcohol or use illicit drugs.  Additional Social History:     CIWA: CIWA-Ar BP: 127/76 mmHg Pulse Rate: 80 COWS:    Allergies:  Allergies  Allergen Reactions  . Quetiapine Other (See Comments)    Facial skin irritation    Home Medications:  (Not in [name] hospital admission)  OB/GYN Status:  No LMP recorded.  General Assessment Data Location of Assessment: [facility] ED ACT Assessment: Yes Is this [name] Tele or Face-to-Face Assessment?: Face-to-Face Is this an Initial Assessment or [name] Re-assessment for this encounter?: Initial Assessment Living Arrangements: Parent Can pt return to current living arrangement?: Yes Admission Status: Voluntary Is patient capable of signing voluntary admission?: Yes Transfer from: Home Referral Source: Self/Family/Friend  Medical Screening Exam [facility] Walk-in ONLY) Medical Exam completed: Yes  [facility] Living Arrangements: Parent Name of Psychiatrist: [facility] Name of Therapist: [facility]  Education Status Is patient currently in school?: No  Risk to self with the past 6 months Suicidal Ideation: No-Not Currently/Within Last 6 Months Suicidal Intent: No-Not Currently/Within Last 6 Months Is patient at risk for suicide?: No Suicidal Plan?: No-Not Currently/Within Last 6 Months Access to Means:  No What has been your use of drugs/alcohol within the last 12 months?: none Previous Attempts/Gestures: No Intentional Self Injurious Behavior: None Family Suicide History: Unknown Recent stressful life event(s): Conflict (Comment) Persecutory voices/beliefs?: No Depression:  (pt denies) Depression Symptoms:  (pt  denies) Substance abuse history and/or treatment for substance abuse?: No  Risk to Others within the past 6 months Homicidal Ideation: No-Not Currently/Within Last 6 Months Thoughts of Harm to Others: No-Not Currently Present/Within Last 6 Months Current Homicidal Intent: No-Not Currently/Within Last 6 Months Current Homicidal Plan: No-Not Currently/Within Last 6 Months Access to Homicidal Means: No History of harm to others?: No Assessment of Violence: None Noted Does 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 to weapons?: No  Psychosis Hallucinations: Visual, Auditory, With command Delusions: Unspecified  Mental Status Report Appear/Hygiene: In hospital gown, Body odor Eye Contact: Poor Motor Activity: Unremarkable Speech: Slow Level of Consciousness: Alert Mood: Suspicious, Labile Affect: Irritable, Constricted Anxiety Level: Minimal Thought Processes: Thought Blocking Judgement: Impaired Orientation: Person, Place Obsessive Compulsive Thoughts/Behaviors: None  Cognitive Functioning Concentration: Poor Memory: Unable to Assess IQ: Average Impulse Control: Fair Vegetative Symptoms: None  ADLScreening [facility] Assessment Services) Patient's cognitive ability adequate to safely complete daily activities?: Yes Patient able to express need for assistance with ADLs?: Yes Independently performs ADLs?: Yes (appropriate for developmental age)  Prior Inpatient Therapy Prior Inpatient Therapy: Yes Prior Therapy Dates: 2013 [facility]: psychosis  Prior Outpatient Therapy Prior Outpatient Therapy: Yes Prior Therapy Dates: 2015 Prior Therapy Facilty/Provider(s): [facility] Reason for Treatment: Schizoaffective d/o  ADL Screening (condition at time of admission) Patient's cognitive ability adequate to safely complete daily activities?: Yes Patient able to express need for assistance with ADLs?: Yes Independently performs ADLs?: Yes (appropriate for  developmental age)             Advance Directives (For Healthcare) Does patient have an advance directive?: No Would patient like information on creating an advanced directive?: No - patient declined information    Additional Information 1:1 In Past 12 Months?: No CIRT Risk: No Elopement Risk: No Does patient have medical clearance?: Yes     Disposition:  Disposition Initial Assessment Completed for this Encounter: Yes Disposition of Patient: Other dispositions Other disposition(s): Other (Comment) (Re-evaluate in the AM)  On Site Evaluation by:   Reviewed with Physician:    Maryelizabeth Rowanorbett, Jesper Stirewalt [name] 07/22/2014 6:50 PM

## 2014-07-22 NOTE — ED Notes (Signed)
Pt states, "you all think I'm crazy because of a scream and 5 policemen came over because of a scream."  Pt's family member explains that pt was screaming "Get this off of me" when there was nothing on the pt.

## 2014-07-23 DIAGNOSIS — F250 Schizoaffective disorder, bipolar type: Secondary | ICD-10-CM

## 2014-07-23 LAB — HIV ANTIBODY (ROUTINE TESTING W REFLEX): HIV 1&2 Ab, 4th Generation: NONREACTIVE

## 2014-07-23 MED ORDER — BENZTROPINE MESYLATE 1 MG PO TABS
1.0000 mg | ORAL_TABLET | Freq: Every day | ORAL | Status: DC
  Filled 2014-07-23: qty 1

## 2014-07-23 MED ORDER — TRAZODONE HCL 100 MG PO TABS
100.0000 mg | ORAL_TABLET | Freq: Every evening | ORAL | Status: DC | PRN
  Filled 2014-07-23: qty 1

## 2014-07-23 MED ORDER — WHITE PETROLATUM GEL
Status: DC | PRN
  Filled 2014-07-23: qty 5
  Filled 2014-07-23: qty 28.35

## 2014-07-23 MED ORDER — PALIPERIDONE ER 6 MG PO TB24
6.0000 mg | ORAL_TABLET | Freq: Every day | ORAL | Status: DC
  Administered 2014-07-24: 6 mg via ORAL
  Filled 2014-07-23 (×2): qty 1

## 2014-07-23 NOTE — Consult Note (Signed)
[facility] Face-to-Face Psychiatry Consult   Reason for Consult:  Psychosis and delusional thinking Referring Physician: EDP Nyssa P Siegrist is an 28 y.o. female. Total Time spent with patient: 45 minutes  Assessment: AXIS I:  Schizoaffective Disorder AXIS II:  Deferred AXIS III:   Past Medical History  Diagnosis Date  . Bipolar 1 disorder    AXIS IV:  other psychosocial or environmental problems, problems with access to health care services and problems with primary support group AXIS V:  21-30 behavior considerably influenced by delusions or hallucinations OR serious impairment in judgment, communication OR inability to function in almost all areas  Plan:  Recommend psychiatric Inpatient admission when medically cleared.  Subjective:   Yasmina P Chenard is a 28 y.o. female patient admitted with delusions.  HPI: Deona P Penado is an 28 y.o. Female with long history mental illness, diagnosed with Schizoaffective disorder Bipolar type. Patient was brought into the Ed by [facility] initiated by her mother because of patient believing things were crawling on her which caused her to scream but mother says nothing was on her. Patient currently denies SI/HI, hallucinations, and other self-injurious behaviors. Patient reports that she do not understand the reason she is here and wondering if she was disturbing the peace. "I am wondering what I did wrong". Patient reports she is compliant with [facility] team and her medications. Patient presents with thought blocking, responding to internal stimuli, suspicious, labile, irritable and delusional. Her mother reports that she has been calling 911 requesting a lie detector test and they came to the home but instructed her to google for options.Patient's mother reports that she spoke with the ACTT team before patient was bought into the ED. Our CSW spoke with Nira Conn with [facility] [phone] for collect collateral information. She reports that the patient was seen on  07/19/2014 and  received her IM invega 163m medication. The patient has been more irritable and confrontational than normal. Patient is refusing to leave her home due to increased paranoia. The patient will not take PO medications therefore changing her medications has been difficult. Patient denies drugs and alcohol abuse. She will benefit from inpatient admission  HPI Elements:   Location:  delusional thinking, aggressive behavior. Quality:  severe. Duration:  in the last few days. Context:  poor compliance with oral medications.  Past Psychiatric History: Past Medical History  Diagnosis Date  . Bipolar 1 disorder     reports that she has never smoked. She has never used smokeless tobacco. She reports that she does not drink alcohol or use illicit drugs. Family History  Problem Relation Age of Onset  . Cancer Father     lymphoma   Family History Substance Abuse: No Family Supports: Yes, List: (mother) Living Arrangements: Parent Can pt return to current living arrangement?: Yes   Allergies:   Allergies  Allergen Reactions  . Quetiapine Other (See Comments)    Facial skin irritation    ACT Assessment Complete:  Yes:    Educational Status    Risk to Self: Risk to self with the past 6 months Suicidal Ideation: No-Not Currently/Within Last 6 Months Suicidal Intent: No-Not Currently/Within Last 6 Months Is patient at risk for suicide?: No Suicidal Plan?: No-Not Currently/Within Last 6 Months Access to Means: No What has been your use of drugs/alcohol within the last 12 months?: none Previous Attempts/Gestures: No Intentional Self Injurious Behavior: None Family Suicide History: Unknown Recent stressful life event(s): Conflict (Comment) Persecutory voices/beliefs?: No Depression:  (pt denies) Depression  Symptoms:  (pt denies) Substance abuse history and/or treatment for substance abuse?: No  Risk to Others: Risk to Others within the past 6 months Homicidal Ideation:  No-Not Currently/Within Last 6 Months Thoughts of Harm to Others: No-Not Currently Present/Within Last 6 Months Current Homicidal Intent: No-Not Currently/Within Last 6 Months Current Homicidal Plan: No-Not Currently/Within Last 6 Months Access to Homicidal Means: No History of harm to others?: No Assessment of Violence: None Noted Does 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 to weapons?: No  Abuse:    Prior Inpatient Therapy: Prior Inpatient Therapy Prior Inpatient Therapy: Yes Prior Therapy Dates: 2013 Prior Therapy Facilty/Provider(s): [facility] Reason for Treatment: psychosis  Prior Outpatient Therapy: Prior Outpatient Therapy Prior Outpatient Therapy: Yes Prior Therapy Dates: 2015 Prior Therapy Facilty/Provider(s): [facility] Reason for Treatment: Schizoaffective d/o  Additional Information: Additional Information 1:1 In Past 12 Months?: No CIRT Risk: No Elopement Risk: No Does patient have medical clearance?: Yes                  Objective: Blood pressure 109/71, pulse 77, temperature 98.4 F (36.9 C), temperature source Oral, resp. rate 19, SpO2 100 %.There is no weight on file to calculate BMI. Results for orders placed or performed during the hospital encounter of 07/22/14 (from the past 72 hour(s))  Salicylate level     Status: Abnormal   Collection Time: 07/22/14  1:27 PM  Result Value Ref Range   Salicylate Lvl <9.3 (L) 2.8 - 20.0 mg/dL  Acetaminophen level     Status: None   Collection Time: 07/22/14  1:27 PM  Result Value Ref Range   Acetaminophen (Tylenol), Serum <15.0 10 - 30 ug/mL    Comment:        THERAPEUTIC CONCENTRATIONS VARY SIGNIFICANTLY. A RANGE OF 10-30 ug/mL MAY BE AN EFFECTIVE CONCENTRATION FOR MANY PATIENTS. HOWEVER, SOME ARE BEST TREATED AT CONCENTRATIONS OUTSIDE THIS RANGE. ACETAMINOPHEN CONCENTRATIONS >150 ug/mL AT 4 HOURS AFTER INGESTION AND >50 ug/mL AT 12 HOURS AFTER INGESTION ARE OFTEN ASSOCIATED WITH TOXIC REACTIONS.   CBC with Differential      Status: None   Collection Time: 07/22/14  1:27 PM  Result Value Ref Range   WBC 5.2 4.0 - 10.5 K/uL   RBC 4.74 3.87 - 5.11 MIL/uL   Hemoglobin 12.9 12.0 - 15.0 g/dL   HCT 38.3 36.0 - 46.0 %   MCV 80.8 78.0 - 100.0 fL   MCH 27.2 26.0 - 34.0 pg   MCHC 33.7 30.0 - 36.0 g/dL   RDW 13.1 11.5 - 15.5 %   Platelets 232 150 - 400 K/uL   Neutrophils Relative % 51 43 - 77 %   Neutro Abs 2.7 1.7 - 7.7 K/uL   Lymphocytes Relative 38 12 - 46 %   Lymphs Abs 2.0 0.7 - 4.0 K/uL   Monocytes Relative 7 3 - 12 %   Monocytes Absolute 0.4 0.1 - 1.0 K/uL   Eosinophils Relative 3 0 - 5 %   Eosinophils Absolute 0.2 0.0 - 0.7 K/uL   Basophils Relative 1 0 - 1 %   Basophils Absolute 0.1 0.0 - 0.1 K/uL  Comprehensive metabolic panel     Status: Abnormal   Collection Time: 07/22/14  1:27 PM  Result Value Ref Range   Sodium 136 (L) 137 - 147 mEq/L   Potassium 4.2 3.7 - 5.3 mEq/L   Chloride 99 96 - 112 mEq/L   CO2 25 19 - 32 mEq/L   Glucose, Bld 107 (H) 70 - 99 mg/dL  BUN 17 6 - 23 mg/dL   Creatinine, Ser 0.79 0.50 - 1.10 mg/dL   Calcium 9.5 8.4 - 10.5 mg/dL   Total Protein 7.9 6.0 - 8.3 g/dL   Albumin 3.9 3.5 - 5.2 g/dL   AST 18 0 - 37 U/L   ALT 11 0 - 35 U/L   Alkaline Phosphatase 59 39 - 117 U/L   Total Bilirubin <0.2 (L) 0.3 - 1.2 mg/dL   GFR calc non Af Amer >90 >90 mL/min   GFR calc Af Amer >90 >90 mL/min    Comment: (NOTE) The eGFR has been calculated using the CKD EPI equation. This calculation has not been validated in all clinical situations. eGFR's persistently <90 mL/min signify possible Chronic Kidney Disease.    Anion gap 12 5 - 15  RPR     Status: None   Collection Time: 07/22/14  1:27 PM  Result Value Ref Range   RPR NON REAC NON REAC    Comment: Performed at [facility]  HIV antibody     Status: None   Collection Time: 07/22/14  1:27 PM  Result Value Ref Range   HIV 1&2 Ab, 4th Generation NONREACTIVE NONREACTIVE    Comment: (NOTE) A NONREACTIVE HIV Ag/Ab result  does not exclude HIV infection since the time frame for seroconversion is variable. If acute HIV infection is suspected, a HIV-1 RNA Qualitative TMA test is recommended. HIV-1/2 Antibody Diff         Not indicated. HIV-1 RNA, Qual TMA           Not indicated. PLEASE NOTE: This information has been disclosed to you from records whose confidentiality may be protected by state law. If your state requires such protection, then the state law prohibits you from making any further disclosure of the information without the specific written consent of the person to whom it pertains, or as otherwise permitted by law. A general authorization for the release of medical or other information is NOT sufficient for this purpose. The performance of this assay has not been clinically validated in patients less than 53 years old. Performed at [facility], genital     Status: Abnormal   Collection Time: 07/22/14  3:21 PM  Result Value Ref Range   Yeast Wet Prep HPF POC NONE SEEN NONE SEEN   Trich, Wet Prep NONE SEEN NONE SEEN   Clue Cells Wet Prep HPF POC NONE SEEN NONE SEEN   WBC, Wet Prep HPF POC FEW (A) NONE SEEN  Urinalysis, Routine w reflex microscopic     Status: Abnormal   Collection Time: 07/22/14  5:20 PM  Result Value Ref Range   Color, Urine YELLOW YELLOW   APPearance CLEAR CLEAR   Specific Gravity, Urine 1.015 1.005 - 1.030   pH 5.5 5.0 - 8.0   Glucose, UA NEGATIVE NEGATIVE mg/dL   Hgb urine dipstick SMALL (A) NEGATIVE   Bilirubin Urine NEGATIVE NEGATIVE   Ketones, ur NEGATIVE NEGATIVE mg/dL   Protein, ur NEGATIVE NEGATIVE mg/dL   Urobilinogen, UA 0.2 0.0 - 1.0 mg/dL   Nitrite NEGATIVE NEGATIVE   Leukocytes, UA NEGATIVE NEGATIVE  Drug screen panel, emergency     Status: None   Collection Time: 07/22/14  5:20 PM  Result Value Ref Range   Opiates NONE DETECTED NONE DETECTED   Cocaine NONE DETECTED NONE DETECTED   Benzodiazepines NONE DETECTED NONE DETECTED    Amphetamines NONE DETECTED NONE DETECTED   Tetrahydrocannabinol NONE DETECTED  NONE DETECTED   Barbiturates NONE DETECTED NONE DETECTED    Comment:        DRUG SCREEN FOR MEDICAL PURPOSES ONLY.  IF CONFIRMATION IS NEEDED FOR ANY PURPOSE, NOTIFY LAB WITHIN 5 DAYS.        LOWEST DETECTABLE LIMITS FOR URINE DRUG SCREEN Drug Class       Cutoff (ng/mL) Amphetamine      1000 Barbiturate      200 Benzodiazepine   366 Tricyclics       294 Opiates          300 Cocaine          300 THC              50   Pregnancy, urine     Status: None   Collection Time: 07/22/14  5:20 PM  Result Value Ref Range   Preg Test, Ur NEGATIVE NEGATIVE    Comment:        THE SENSITIVITY OF THIS METHODOLOGY IS >20 mIU/mL.   Urine microscopic-add on     Status: Abnormal   Collection Time: 07/22/14  5:20 PM  Result Value Ref Range   Squamous Epithelial / LPF FEW (A) RARE   RBC / HPF 3-6 <3 RBC/hpf   Bacteria, UA FEW (A) RARE   Labs are reviewed and are pertinent for the above.  Current Facility-Administered Medications  Medication Dose Route Frequency Provider Last Rate Last Dose  . acetaminophen (TYLENOL) tablet 650 mg  650 mg Oral Q4H PRN Nicole Pisciotta, PA-C      . alum & mag hydroxide-simeth (MAALOX/MYLANTA) 200-200-20 MG/5ML suspension 30 mL  30 mL Oral PRN Nicole Pisciotta, PA-C      . ibuprofen (ADVIL,MOTRIN) tablet 600 mg  600 mg Oral Q8H PRN Nicole Pisciotta, PA-C      . LORazepam (ATIVAN) tablet 1 mg  1 mg Oral Q8H PRN Nicole Pisciotta, PA-C      . nicotine (NICODERM CQ - dosed in mg/24 hours) patch 21 mg  21 mg Transdermal Daily [employer], PA-C   Stopped at 07/23/14 7654  . ondansetron (ZOFRAN) tablet 4 mg  4 mg Oral Q8H PRN Nicole Pisciotta, PA-C      . white petrolatum (VASELINE) gel   Topical PRN Malvin Johns, MD      . zolpidem (AMBIEN) tablet 5 mg  5 mg Oral QHS PRN Monico Blitz, PA-C       Current Outpatient Prescriptions  Medication Sig Dispense Refill  . Paliperidone  Palmitate (INVEGA SUSTENNA) 156 MG/ML SUSP Inject 156 mg into the muscle every 28 (twenty-eight) days.       Psychiatric Specialty Exam:     Blood pressure 109/71, pulse 77, temperature 98.4 F (36.9 C), temperature source Oral, resp. rate 19, SpO2 100 %.There is no weight on file to calculate BMI.  General Appearance: Disheveled  Eye Contact::  Minimal  Speech:  Clear and Coherent  Volume:  Normal  Mood:  Depressed  Affect:  Labile  Thought Process:  Circumstantial and Disorganized  Orientation:  Full (Time, Place, and Person)  Thought Content:  Delusions and Paranoid Ideation, visual hallucinations  Suicidal Thoughts:  No  Homicidal Thoughts:  No  Memory:  Immediate;   Fair Recent;   Fair Remote;   Fair  Judgement:  Impaired  Insight:  Lacking  Psychomotor Activity:  Increased  Concentration:  Fair  Recall:  [employer] of Knowledge:Good  Language: Fair  Akathisia:  No  Handed:  Right  AIMS (if indicated):     Assets:  Communication Skills Desire for Improvement Physical Health  Sleep:   poor   Musculoskeletal: Strength & Muscle Tone: within normal limits Gait & Station: normal Patient leans: N/A  Treatment Plan Summary: Daily contact with patient to assess and evaluate symptoms and progress in treatment Medication management Recommends inpatient admission  Corena Pilgrim, MD 07/23/2014 2:33 PM

## 2014-07-23 NOTE — ED Notes (Signed)
[facility]

## 2014-07-23 NOTE — ED Notes (Signed)
Patients mother into see 

## 2014-07-24 ENCOUNTER — Encounter (HOSPITAL_COMMUNITY): Payer: Self-pay | Admitting: Psychiatry

## 2014-07-24 ENCOUNTER — Inpatient Hospital Stay: Payer: Self-pay | Admitting: Psychiatry

## 2014-07-24 DIAGNOSIS — R45851 Suicidal ideations: Secondary | ICD-10-CM

## 2014-07-24 DIAGNOSIS — F259 Schizoaffective disorder, unspecified: Secondary | ICD-10-CM

## 2014-07-24 DIAGNOSIS — F22 Delusional disorders: Secondary | ICD-10-CM | POA: Insufficient documentation

## 2014-07-24 NOTE — ED Notes (Signed)
Pt is anxious, paranoid, when writer in to assess with chair blocking door. Her speech is disorganized and she is responding to internal stimuli, talking to self. She states '' i want go , i can hear them trying to poison me. ''she was clearly agitated, anxious and hesitant to take meds but did with encouragement. Will con't to monitor q 15 minutes as ordered.

## 2014-07-24 NOTE — ED Notes (Signed)
Patient is sitting in bed on approach today. Remains guarded. When asked what brought her to the hospital she stated that she was screaming. Did shake head yes when asked if she was scared. States the police came and brought her here. Reports mother wanted her to come here. Vague about hearing hallucinations. Talking about people walking by her door. Remains paranoid.

## 2014-07-24 NOTE — ED Notes (Signed)
[facility] reports [facility] needs higher level of care.

## 2014-07-24 NOTE — BHH Counselor (Signed)
[facility], pt has been accepted to bed 301 by Dr Jennet MaduroPucilowska. Pt to be transported after 7 pm as room isn't ready yet. # for report is [phone].  Evette Cristalaroline Paige Jenson Beedle, [place] Assessment Counselor

## 2014-07-24 NOTE — Consult Note (Signed)
[facility] Face-to-Face Psychiatry Consult   Reason for Consult:  Depression, hallucinations Referring Physician:  EDP  [name] is an 28 y.o. female. Total Time spent with patient: 45 minutes  Assessment: AXIS I:  Schizoaffective Disorder AXIS II:  Deferred AXIS III:   Past Medical History  Diagnosis Date  . Bipolar 1 disorder    AXIS IV:  other psychosocial or environmental problems and problems related to social environment AXIS V:  21-30 behavior considerably influenced by delusions or hallucinations OR serious impairment in judgment, communication OR inability to function in almost all areas  Plan:  Recommend psychiatric Inpatient admission when medically cleared.  Subjective:   [name] is a 28 y.o. female patient admitted with depression and increase in hallucinations.  HPI:  [name] stated she is here because she screamed because she thought she saw something, discounted her depression and hallucinations at first.  Her mother reports that she has been crying and depressed lately and called the police requesting a lie detector test. [name] stated she felt people were not telling her the truth and that's why she called them.  She does admit to depression from being bored at home.  Her mother reports she has seen all of her siblings get jobs and move on but her.  Her mother reports her saying "can't take it anymore." HPI Elements:   Location:  generalized. Quality:  acute. Severity:  severe. Timing:  constant. Duration:  couple of weeks. Context:  stressors, boredom.  Past Psychiatric History: Past Medical History  Diagnosis Date  . Bipolar 1 disorder     reports that she has never smoked. She has never used smokeless tobacco. She reports that she does not drink alcohol or use illicit drugs. Family History  Problem Relation Age of Onset  . Cancer Father     lymphoma   Family History Substance Abuse: No Family Supports: Yes, List: (mother) Living Arrangements:  Parent Can pt return to current living arrangement?: Yes   Allergies:   Allergies  Allergen Reactions  . Quetiapine Other (See Comments)    Facial skin irritation    ACT Assessment Complete:  Yes:    Educational Status    Risk to Self: Risk to self with the past 6 months Suicidal Ideation: No-Not Currently/Within Last 6 Months Suicidal Intent: No-Not Currently/Within Last 6 Months Is patient at risk for suicide?: No Suicidal Plan?: No-Not Currently/Within Last 6 Months Access to Means: No What has been your use of drugs/alcohol within the last 12 months?: none Previous Attempts/Gestures: No Intentional Self Injurious Behavior: None Family Suicide History: Unknown Recent stressful life event(s): Conflict (Comment) Persecutory voices/beliefs?: No Depression:  (pt denies) Depression Symptoms:  (pt denies) Substance abuse history and/or treatment for substance abuse?: No  Risk to Others: Risk to Others within the past 6 months Homicidal Ideation: No-Not Currently/Within Last 6 Months Thoughts of Harm to Others: No-Not Currently Present/Within Last 6 Months Current Homicidal Intent: No-Not Currently/Within Last 6 Months Current Homicidal Plan: No-Not Currently/Within Last 6 Months Access to Homicidal Means: No History of harm to others?: No Assessment of Violence: None Noted Does 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 to weapons?: No  Abuse:    Prior Inpatient Therapy: Prior Inpatient Therapy Prior Inpatient Therapy: Yes Prior Therapy Dates: 2013 Prior Therapy Facilty/Provider(s): [facility] Reason for Treatment: psychosis  Prior Outpatient Therapy: Prior Outpatient Therapy Prior Outpatient Therapy: Yes Prior Therapy Dates: 2015 Prior Therapy Facilty/Provider(s): [facility] Reason for Treatment: Schizoaffective d/o  Additional Information: Additional Information 1:1 In Past  12 Months?: No CIRT Risk: No Elopement Risk: No Does patient have medical clearance?: Yes                   Objective: Blood pressure 109/81, pulse 80, temperature 98.4 F (36.9 C), temperature source Oral, resp. rate 16, SpO2 100 %.There is no weight on file to calculate BMI. Results for orders placed or performed during the hospital encounter of 07/22/14 (from the past 72 hour(s))  Salicylate level     Status: Abnormal   Collection Time: 07/22/14  1:27 PM  Result Value Ref Range   Salicylate Lvl <2.4 (L) 2.8 - 20.0 mg/dL  Acetaminophen level     Status: None   Collection Time: 07/22/14  1:27 PM  Result Value Ref Range   Acetaminophen (Tylenol), Serum <15.0 10 - 30 ug/mL    Comment:        THERAPEUTIC CONCENTRATIONS VARY SIGNIFICANTLY. A RANGE OF 10-30 ug/mL MAY BE AN EFFECTIVE CONCENTRATION FOR MANY PATIENTS. HOWEVER, SOME ARE BEST TREATED AT CONCENTRATIONS OUTSIDE THIS RANGE. ACETAMINOPHEN CONCENTRATIONS >150 ug/mL AT 4 HOURS AFTER INGESTION AND >50 ug/mL AT 12 HOURS AFTER INGESTION ARE OFTEN ASSOCIATED WITH TOXIC REACTIONS.   CBC with Differential     Status: None   Collection Time: 07/22/14  1:27 PM  Result Value Ref Range   WBC 5.2 4.0 - 10.5 K/uL   RBC 4.74 3.87 - 5.11 MIL/uL   Hemoglobin 12.9 12.0 - 15.0 g/dL   HCT 38.3 36.0 - 46.0 %   MCV 80.8 78.0 - 100.0 fL   MCH 27.2 26.0 - 34.0 pg   MCHC 33.7 30.0 - 36.0 g/dL   RDW 13.1 11.5 - 15.5 %   Platelets 232 150 - 400 K/uL   Neutrophils Relative % 51 43 - 77 %   Neutro Abs 2.7 1.7 - 7.7 K/uL   Lymphocytes Relative 38 12 - 46 %   Lymphs Abs 2.0 0.7 - 4.0 K/uL   Monocytes Relative 7 3 - 12 %   Monocytes Absolute 0.4 0.1 - 1.0 K/uL   Eosinophils Relative 3 0 - 5 %   Eosinophils Absolute 0.2 0.0 - 0.7 K/uL   Basophils Relative 1 0 - 1 %   Basophils Absolute 0.1 0.0 - 0.1 K/uL  Comprehensive metabolic panel     Status: Abnormal   Collection Time: 07/22/14  1:27 PM  Result Value Ref Range   Sodium 136 (L) 137 - 147 mEq/L   Potassium 4.2 3.7 - 5.3 mEq/L   Chloride 99 96 - 112 mEq/L   CO2  25 19 - 32 mEq/L   Glucose, Bld 107 (H) 70 - 99 mg/dL   BUN 17 6 - 23 mg/dL   Creatinine, Ser 0.79 0.50 - 1.10 mg/dL   Calcium 9.5 8.4 - 10.5 mg/dL   Total Protein 7.9 6.0 - 8.3 g/dL   Albumin 3.9 3.5 - 5.2 g/dL   AST 18 0 - 37 U/L   ALT 11 0 - 35 U/L   Alkaline Phosphatase 59 39 - 117 U/L   Total Bilirubin <0.2 (L) 0.3 - 1.2 mg/dL   GFR calc non Af Amer >90 >90 mL/min   GFR calc Af Amer >90 >90 mL/min    Comment: (NOTE) The eGFR has been calculated using the CKD EPI equation. This calculation has not been validated in all clinical situations. eGFR's persistently <90 mL/min signify possible Chronic Kidney Disease.    Anion gap 12 5 - 15  RPR  Status: None   Collection Time: 07/22/14  1:27 PM  Result Value Ref Range   RPR NON REAC NON REAC    Comment: Performed at [facility]  HIV antibody     Status: None   Collection Time: 07/22/14  1:27 PM  Result Value Ref Range   HIV 1&2 Ab, 4th Generation NONREACTIVE NONREACTIVE    Comment: (NOTE) A NONREACTIVE HIV Ag/Ab result does not exclude HIV infection since the time frame for seroconversion is variable. If acute HIV infection is suspected, a HIV-1 RNA Qualitative TMA test is recommended. HIV-1/2 Antibody Diff         Not indicated. HIV-1 RNA, Qual TMA           Not indicated. PLEASE NOTE: This information has been disclosed to you from records whose confidentiality may be protected by state law. If your state requires such protection, then the state law prohibits you from making any further disclosure of the information without the specific written consent of the person to whom it pertains, or as otherwise permitted by law. A general authorization for the release of medical or other information is NOT sufficient for this purpose. The performance of this assay has not been clinically validated in patients less than 51 years old. Performed at [facility], genital     Status: Abnormal   Collection  Time: 07/22/14  3:21 PM  Result Value Ref Range   Yeast Wet Prep HPF POC NONE SEEN NONE SEEN   Trich, Wet Prep NONE SEEN NONE SEEN   Clue Cells Wet Prep HPF POC NONE SEEN NONE SEEN   WBC, Wet Prep HPF POC FEW (A) NONE SEEN  Urinalysis, Routine w reflex microscopic     Status: Abnormal   Collection Time: 07/22/14  5:20 PM  Result Value Ref Range   Color, Urine YELLOW YELLOW   APPearance CLEAR CLEAR   Specific Gravity, Urine 1.015 1.005 - 1.030   pH 5.5 5.0 - 8.0   Glucose, UA NEGATIVE NEGATIVE mg/dL   Hgb urine dipstick SMALL (A) NEGATIVE   Bilirubin Urine NEGATIVE NEGATIVE   Ketones, ur NEGATIVE NEGATIVE mg/dL   Protein, ur NEGATIVE NEGATIVE mg/dL   Urobilinogen, UA 0.2 0.0 - 1.0 mg/dL   Nitrite NEGATIVE NEGATIVE   Leukocytes, UA NEGATIVE NEGATIVE  Drug screen panel, emergency     Status: None   Collection Time: 07/22/14  5:20 PM  Result Value Ref Range   Opiates NONE DETECTED NONE DETECTED   Cocaine NONE DETECTED NONE DETECTED   Benzodiazepines NONE DETECTED NONE DETECTED   Amphetamines NONE DETECTED NONE DETECTED   Tetrahydrocannabinol NONE DETECTED NONE DETECTED   Barbiturates NONE DETECTED NONE DETECTED    Comment:        DRUG SCREEN FOR MEDICAL PURPOSES ONLY.  IF CONFIRMATION IS NEEDED FOR ANY PURPOSE, NOTIFY LAB WITHIN 5 DAYS.        LOWEST DETECTABLE LIMITS FOR URINE DRUG SCREEN Drug Class       Cutoff (ng/mL) Amphetamine      1000 Barbiturate      200 Benzodiazepine   440 Tricyclics       102 Opiates          300 Cocaine          300 THC              50   Pregnancy, urine     Status: None   Collection Time: 07/22/14  5:20 PM  Result Value Ref Range   Preg Test, Ur NEGATIVE NEGATIVE    Comment:        THE SENSITIVITY OF THIS METHODOLOGY IS >20 mIU/mL.   Urine microscopic-add on     Status: Abnormal   Collection Time: 07/22/14  5:20 PM  Result Value Ref Range   Squamous Epithelial / LPF FEW (A) RARE   RBC / HPF 3-6 <3 RBC/hpf   Bacteria, UA FEW (A)  RARE   Labs are reviewed and are pertinent for no medical issues.  Current Facility-Administered Medications  Medication Dose Route Frequency Provider Last Rate Last Dose  . acetaminophen (TYLENOL) tablet 650 mg  650 mg Oral Q4H PRN Nicole Pisciotta, PA-C      . alum & mag hydroxide-simeth (MAALOX/MYLANTA) 200-200-20 MG/5ML suspension 30 mL  30 mL Oral PRN Nicole Pisciotta, PA-C      . benztropine (COGENTIN) tablet 1 mg  1 mg Oral Daily Tatia Petrucci   1 mg at 07/24/14 0815  . ibuprofen (ADVIL,MOTRIN) tablet 600 mg  600 mg Oral Q8H PRN Monico Blitz, PA-C      . LORazepam (ATIVAN) tablet 1 mg  1 mg Oral Q8H PRN Nicole Pisciotta, PA-C   1 mg at 07/24/14 0813  . nicotine (NICODERM CQ - dosed in mg/24 hours) patch 21 mg  21 mg Transdermal Daily Monico Blitz, PA-C   Stopped at 07/23/14 1856  . ondansetron (ZOFRAN) tablet 4 mg  4 mg Oral Q8H PRN Nicole Pisciotta, PA-C      . paliperidone (INVEGA) 24 hr tablet 6 mg  6 mg Oral Daily Sumayyah Custodio   6 mg at 07/24/14 1033  . white petrolatum (VASELINE) gel   Topical PRN Malvin Johns, MD       Current Outpatient Prescriptions  Medication Sig Dispense Refill  . Paliperidone Palmitate (INVEGA SUSTENNA) 156 MG/ML SUSP Inject 156 mg into the muscle every 28 (twenty-eight) days.       Psychiatric Specialty Exam:     Blood pressure 109/81, pulse 80, temperature 98.4 F (36.9 C), temperature source Oral, resp. rate 16, SpO2 100 %.There is no weight on file to calculate BMI.  General Appearance: Disheveled  Eye Sport and exercise psychologist::  Fair  Speech:  Normal Rate  Volume:  Normal  Mood:  Depressed  Affect:  Blunt  Thought Process:  Coherent  Orientation:  Full (Time, Place, and Person)  Thought Content:  Hallucinations: Visual  Suicidal Thoughts:  Yes.  with intent/plan  Homicidal Thoughts:  No  Memory:  Immediate;   Fair Recent;   Fair Remote;   Fair  Judgement:  Fair  Insight:  Fair  Psychomotor Activity:  Decreased  Concentration:  Fair   Recall:  [employer] of Knowledge:Fair  Language: Fair  Akathisia:  No  Handed:  Right  AIMS (if indicated):     Assets:  Housing Leisure Time Physical Health Resilience Social Support  Sleep:      Musculoskeletal: Strength & Muscle Tone: within normal limits Gait & Station: normal Patient leans: N/A  Treatment Plan Summary: Daily contact with patient to assess and evaluate symptoms and progress in treatment Medication management; admit to [facility] for stabilization.  Waylan Boga, [name] 07/24/2014 4:45 PM  Patient seen, evaluated and I agree with notes by Nurse Practitioner. Corena Pilgrim, MD

## 2014-07-24 NOTE — BH Assessment (Signed)
[facility] Assessment Progress Note    The following Adult psych facilities were contacted in an attempt to place the pt:  Referral faxed for review: [facility] (beds available per Claris CheMargaret) [facility] (beds available per Amy) [facility] (beds available per Lloyd HugerNeil) [facility] (beds available per Serina CowperAlisa) [facility] (beds available per Victorino DikeJennifer)  At capacity:  [facility] (at capacity per Kipp BroodBrent) [facility] (at capacity per Alcario DroughtErica) [facility] (at capacity per Christiane HaJonathan) [facility] (at capacity per Sissy) [facility] (at capacity per Dennie BiblePat) [facility] (at capacity per Gunnar FusiPaula) [facility] (at capacity per Cala BradfordKimberly)  No answer: [facility]    TTS will continue to seek placement for the pt.  Beryle FlockMary Boyce Keltner, MS, CRC, [facility] Licensed Professional Counselor Therapeutic Triage Specialist [facility] Phone: [phone] Fax: [phone]

## 2014-07-24 NOTE — ED Notes (Signed)
Patient appears anxious, states "something just ain't right. Can you call home first thing in the morning?".   Encouragement offered. Patient declines offers of Ativan or Trazodone to help her rest at present. Patient returned to bed.  Q 15 safety checks continue.

## 2014-07-25 LAB — GC/CHLAMYDIA PROBE AMP
CT Probe RNA: NEGATIVE
GC PROBE AMP APTIMA: NEGATIVE

## 2014-07-27 LAB — LIPID PANEL
CHOLESTEROL: 179 mg/dL (ref 0–200)
HDL Cholesterol: 44 mg/dL (ref 40–60)
LDL CHOLESTEROL, CALC: 125 mg/dL — AB (ref 0–100)
TRIGLYCERIDES: 49 mg/dL (ref 0–200)
VLDL Cholesterol, Calc: 10 mg/dL (ref 5–40)

## 2014-07-27 LAB — HEMOGLOBIN A1C: Hemoglobin A1C: 5.9 % (ref 4.2–6.3)

## 2014-08-10 ENCOUNTER — Ambulatory Visit: Payer: Self-pay | Admitting: Dietician

## 2014-08-27 ENCOUNTER — Emergency Department (INDEPENDENT_AMBULATORY_CARE_PROVIDER_SITE_OTHER)
Admission: EM | Admit: 2014-08-27 | Discharge: 2014-08-27 | Disposition: A | Discharge status: Home / Self Care | Payer: PRIVATE HEALTH INSURANCE | Source: Home / Self Care | Attending: Emergency Medicine | Admitting: Emergency Medicine

## 2014-08-27 ENCOUNTER — Encounter (HOSPITAL_COMMUNITY): Payer: Self-pay

## 2014-08-27 DIAGNOSIS — H6692 Otitis media, unspecified, left ear: Secondary | ICD-10-CM

## 2014-08-27 DIAGNOSIS — J029 Acute pharyngitis, unspecified: Secondary | ICD-10-CM

## 2014-08-27 LAB — POCT RAPID STREP A: Streptococcus, Group A Screen (Direct): NEGATIVE

## 2014-08-27 MED ORDER — HYDROCODONE-ACETAMINOPHEN 5-325 MG PO TABS: 1.0000 | ORAL_TABLET | ORAL | Status: DC | PRN

## 2014-08-27 MED ORDER — AMOXICILLIN-POT CLAVULANATE 875-125 MG PO TABS: 1.0000 | ORAL_TABLET | Freq: Two times a day (BID) | ORAL | Status: DC

## 2014-08-27 MED ORDER — HYDROCODONE-ACETAMINOPHEN 5-325 MG PO TABS
ORAL_TABLET | ORAL | Status: AC
  Filled 2014-08-27: qty 1

## 2014-08-27 MED ORDER — IBUPROFEN 800 MG PO TABS
ORAL_TABLET | ORAL | Status: AC
  Filled 2014-08-27: qty 1

## 2014-08-27 MED ORDER — IBUPROFEN 800 MG PO TABS
800.0000 mg | ORAL_TABLET | Freq: Once | ORAL | Status: AC
  Administered 2014-08-27: 800 mg via ORAL

## 2014-08-27 MED ORDER — HYDROCODONE-ACETAMINOPHEN 5-325 MG PO TABS
1.0000 | ORAL_TABLET | Freq: Once | ORAL | Status: AC
  Administered 2014-08-27: 1 via ORAL

## 2014-08-27 NOTE — Discharge Instructions (Signed)
Take the antibiotic as directed until completed. Use over the counter Chloraseptic spray as needed for throat pain. Continue Ibuprofen 600-800 mg 2 to 3 times a day for the next 2 to 3 days and the Vicodin for more severe pain. Follow up with your family doctor if symptoms do not resolve.  Otitis Media Otitis media is redness, soreness, and puffiness (swelling) in the space just behind your eardrum (middle ear). It may be caused by allergies or infection. It often happens along with a cold. HOME CARE  Take your medicine as told. Finish it even if you start to feel better.  Only take over-the-counter or prescription medicines for pain, discomfort, or fever as told by your doctor.  Follow up with your doctor as told. GET HELP IF:  You have otitis media only in one ear, or bleeding from your nose, or both.  You notice a lump on your neck.  You are not getting better in 3-5 days.  You feel worse instead of better. GET HELP RIGHT AWAY IF:   You have pain that is not helped with medicine.  You have puffiness, redness, or pain around your ear.  You get a stiff neck.  You cannot move part of your face (paralysis).  You notice that the bone behind your ear hurts when you touch it. MAKE SURE YOU:   Understand these instructions.  Will watch your condition.  Will get help right away if you are not doing well or get worse. Document Released: 01/28/2008 Document Revised: 08/16/2013 Document Reviewed: 03/08/2013 [facility] Patient Information 2015 [place], [place]. This information is not intended to replace advice given to you by your health care provider. Make sure you discuss any questions you have with your health care provider.  Pharyngitis Pharyngitis is a sore throat (pharynx). There is redness, pain, and swelling of your throat. HOME CARE   Drink enough fluids to keep your pee (urine) clear or pale yellow.  Only take medicine as told by your doctor.  You may get sick again if  you do not take medicine as told. Finish your medicines, even if you start to feel better.  Do not take aspirin.  Rest.  Rinse your mouth (gargle) with salt water ( tsp of salt per 1 qt of water) every 1-2 hours. This will help the pain.  If you are not at risk for choking, you can suck on hard candy or sore throat lozenges. GET HELP IF:  You have large, tender lumps on your neck.  You have a rash.  You cough up green, yellow-brown, or bloody spit. GET HELP RIGHT AWAY IF:   You have a stiff neck.  You drool or cannot swallow liquids.  You throw up (vomit) or are not able to keep medicine or liquids down.  You have very bad pain that does not go away with medicine.  You have problems breathing (not from a stuffy nose). MAKE SURE YOU:   Understand these instructions.  Will watch your condition.  Will get help right away if you are not doing well or get worse. Document Released: 01/28/2008 Document Revised: 06/01/2013 Document Reviewed: 04/18/2013 [facility] Patient Information 2015 [place], [place]. This information is not intended to replace advice given to you by your health care provider. Make sure you discuss any questions you have with your health care provider.

## 2014-08-27 NOTE — ED Notes (Signed)
C/o 2 day duration of pain in her left ear. Used olive oil and her fingernail to try to clean her ear . C/o pain w swallowing, temp 100.4, post nasopharynx reddened

## 2014-08-27 NOTE — ED Provider Notes (Signed)
CSN: [id number]     Arrival date & time 08/27/14  1449 History   First MD Initiated Contact with Patient 08/27/14 1544     Chief Complaint  Patient presents with  . Otalgia   HPI: Patient is a 29 y.o. female presenting with ear pain. The history is provided by the patient.  Otalgia Location:  Left Behind ear:  No abnormality Quality:  Aching, sharp and throbbing Severity:  Severe Onset quality:  Gradual Duration:  2 days Timing:  Constant Progression:  Worsening Associated symptoms: fever and sore throat   Associated symptoms: no abdominal pain, no congestion, no cough, no diarrhea, no ear discharge, no hearing loss, no rash, no rhinorrhea, no tinnitus and no vomiting   Pt reports 2 day h/o sore throat and (L) ear pain. Reports h/o strep as a child and teenager. Attempted to clean her ears which did not help. No drainage from the ear noted. Onset of low-grade fever today 100.4. Denies significant cough, N/V/D or other associated symptoms.   Past Medical History  Diagnosis Date  . Bipolar 1 disorder    History reviewed. No pertinent past surgical history. Family History  Problem Relation Age of Onset  . Cancer Father     lymphoma   History  Substance Use Topics  . Smoking status: Never Smoker   . Smokeless tobacco: Never Used  . Alcohol Use: No     Comment: Pt denies    OB History    No data available     Review of Systems  Constitutional: Positive for fever. Negative for chills and fatigue.  HENT: Positive for ear pain and sore throat. Negative for congestion, ear discharge, facial swelling, hearing loss, nosebleeds, postnasal drip, rhinorrhea, sinus pressure, sneezing, tinnitus, trouble swallowing and voice change.   Eyes: Negative.   Respiratory: Negative for cough, wheezing and stridor.   Cardiovascular: Negative.   Gastrointestinal: Negative for nausea, vomiting, abdominal pain and diarrhea.  Musculoskeletal: Negative.   Skin: Negative for rash.    Allergies   Quetiapine  Home Medications   Prior to Admission medications   Medication Sig Start Date End Date Taking? Authorizing Provider  amoxicillin-clavulanate (AUGMENTIN) 875-125 MG per tablet Take 1 tablet by mouth 2 (two) times daily. For 10 days 08/27/14   Leanne Chang, NP  HYDROcodone-acetaminophen (NORCO/VICODIN) 5-325 MG per tablet Take 1 tablet by mouth every 4 (four) hours as needed. 08/27/14   Roma Kayser Abad Manard, NP  Paliperidone Palmitate (INVEGA SUSTENNA) 156 MG/ML SUSP Inject 156 mg into the muscle every 28 (twenty-eight) days.     Historical Provider, MD   BP 115/86 mmHg  Pulse 75  Temp(Src) 100.4 F (38 C) (Oral)  Resp 12  SpO2 100% Physical Exam  Constitutional: She is oriented to person, place, and time. She appears well-developed and well-nourished. No distress.  HENT:  Head: Normocephalic and atraumatic.  Right Ear: Tympanic membrane, external ear and ear canal normal.  Left Ear: Tympanic membrane is injected and erythematous.  Nose: Nose normal. Right sinus exhibits no maxillary sinus tenderness and no frontal sinus tenderness. Left sinus exhibits no maxillary sinus tenderness and no frontal sinus tenderness.  Mouth/Throat: Uvula is midline and mucous membranes are normal. Posterior oropharyngeal erythema present. No oropharyngeal exudate, posterior oropharyngeal edema or tonsillar abscesses.  Eyes: Conjunctivae are normal.  Neck: Neck supple.  Cardiovascular: Normal rate.   Pulmonary/Chest: Effort normal.  Neurological: She is alert and oriented to person, place, and time.  Skin: Skin is warm and dry.  She is not diaphoretic.  Psychiatric: She has a normal mood and affect.  Nursing note and vitals reviewed.   ED Course  Procedures (including critical care time) Labs Review Labs Reviewed  POCT RAPID STREP A ([facility] URG CARE ONLY)    Imaging Review No results found.   MDM   1. Acute left otitis media, recurrence not specified, unspecified otitis media type    2. Pharyngitis    Rapid strep negative. PE c/w AOM (L) ear and pharyngitis. Will treat w/ Augmentin 875/125 mg BID x 10 days given severe ear pain and low grade fever. To arrange f/u w/ PCP if symptoms do not improve.    Leanne Chang, NP 08/27/14 1655

## 2014-08-29 LAB — CULTURE, GROUP A STREP

## 2014-10-03 ENCOUNTER — Ambulatory Visit: Payer: Medicaid Other | Attending: Family Medicine | Admitting: Family Medicine

## 2014-10-03 ENCOUNTER — Ambulatory Visit: Payer: Self-pay | Admitting: Family Medicine

## 2014-10-03 ENCOUNTER — Encounter: Payer: Self-pay | Admitting: Family Medicine

## 2014-10-03 VITALS — BP 112/79 | HR 68 | Temp 98.2°F | Resp 16 | Ht 64.0 in | Wt 186.0 lb

## 2014-10-03 DIAGNOSIS — N946 Dysmenorrhea, unspecified: Secondary | ICD-10-CM | POA: Diagnosis not present

## 2014-10-03 DIAGNOSIS — F250 Schizoaffective disorder, bipolar type: Secondary | ICD-10-CM

## 2014-10-03 DIAGNOSIS — Z9119 Patient's noncompliance with other medical treatment and regimen: Secondary | ICD-10-CM

## 2014-10-03 DIAGNOSIS — Z91199 Patient's noncompliance with other medical treatment and regimen due to unspecified reason: Secondary | ICD-10-CM

## 2014-10-03 DIAGNOSIS — R102 Pelvic and perineal pain: Secondary | ICD-10-CM

## 2014-10-03 DIAGNOSIS — Z139 Encounter for screening, unspecified: Secondary | ICD-10-CM

## 2014-10-03 LAB — POCT URINALYSIS DIPSTICK
Bilirubin, UA: NEGATIVE
Glucose, UA: NEGATIVE
KETONES UA: NEGATIVE
LEUKOCYTES UA: NEGATIVE
Nitrite, UA: NEGATIVE
PH UA: 6.5
PROTEIN UA: NEGATIVE
Spec Grav, UA: 1.01
Urobilinogen, UA: 0.2

## 2014-10-03 LAB — POCT URINE PREGNANCY: Preg Test, Ur: NEGATIVE

## 2014-10-03 NOTE — Progress Notes (Signed)
   Subjective:    Patient ID: [name], female    DOB: 12/03/1985, 29 y.o.   MRN: [id number] CC: establish care, no complaints  HPI 29 yo F presents with her mother to establish care:  1. Bipolar depression: on geodon. [facility]. Tolerating the medicine.   2. Menstrual pain: mild to moderate. Takes pamprin for pain. Denies associated nausea and emesis. Denies severe cramping.   Soc Hx: non smoker Med Hx: bipolar depression Surg Hx: negative  Review of Systems As per HPI  Mild to moderate pain with menses     Objective:   Physical Exam BP 112/79 mmHg  Pulse 68  Temp(Src) 98.2 F (36.8 C) (Oral)  Resp 16  Ht 5\' 4"  (1.626 m)  Wt 186 lb (84.369 kg)  BMI 31.91 kg/m2  SpO2 100%  LMP 09/29/2014 General appearance: alert, cooperative and no distress, affect flat  Lungs: clear to auscultation bilaterally Heart: regular rate and rhythm, S1, S2 normal, no murmur, click, rub or gallop Abdomen: soft, non-tender; bowel sounds normal; no masses,  no organomegaly Extremities: extremities normal, atraumatic, no cyanosis or edema   U preg: negative  UA: trace Hgb, otherwise neg        Assessment & Plan:

## 2014-10-03 NOTE — Patient Instructions (Signed)
[name],  Thank you for coming in today. It was a pleasure meeting you. I look forward to being your primary doctor.   1. For bipolar disorder: [facility].   2. For pain with menses: continue pamprin. Ibuprofren, naproxen and aleve are also helpful. Choose one medicine and take 2-3 times a days. It is helpful to track periods and start medicine 1-2 days before period is due to start.  If needed make a follow up appt to discuss additional treatment options.   You will be called with vit D level.   F/u in one year for physical, sooner if needed  Dr. Armen PickupFunches

## 2014-10-03 NOTE — Assessment & Plan Note (Signed)
For bipolar disorder: [facility]. Continue geodon.

## 2014-10-03 NOTE — Progress Notes (Signed)
Pt comes in to establish care for lower abdominal pain radiating to left side after starting new psych medication Geodon 80 mg BID, [facility]  Denies vaginal d/c or odor Flat affect noted  LMP- 3 dys ago Urine dipstick obtained

## 2014-10-04 LAB — VITAMIN D 25 HYDROXY (VIT D DEFICIENCY, FRACTURES): Vit D, 25-Hydroxy: 31 ng/mL (ref 30–100)

## 2014-10-06 ENCOUNTER — Telehealth: Payer: Self-pay | Admitting: *Deleted

## 2014-10-06 NOTE — Telephone Encounter (Signed)
Left voice message with normal lab If any question return call 

## 2014-10-06 NOTE — Telephone Encounter (Signed)
-----   Message from Lora PaulaJosalyn C Funches, MD sent at 10/04/2014  2:11 PM EST ----- Normal vit D level

## 2014-10-26 ENCOUNTER — Ambulatory Visit: Payer: Medicaid Other | Attending: Family Medicine | Admitting: Family Medicine

## 2014-10-26 VITALS — BP 110/79 | HR 59 | Temp 98.2°F | Resp 16 | Ht 64.0 in | Wt 190.0 lb

## 2014-10-26 DIAGNOSIS — M9905 Segmental and somatic dysfunction of pelvic region: Secondary | ICD-10-CM | POA: Insufficient documentation

## 2014-10-26 NOTE — Progress Notes (Signed)
Patient reports mass on her right buttock that has been there for years

## 2014-10-26 NOTE — Assessment & Plan Note (Signed)
Check to bleed she points to where she feels this thing I do an exam surface and deep axial and no tenderness is noted discoloration there is no mass I can feel at all.  Plan I explained to her that there is nothing they are of concern nothing there that I can do anything about. There is not any kind of mass that I can cut out or biopsy. I have suggested that if she desires she can reschedule appointment with Dr. Armen PickupFunches and get a second opinion

## 2014-10-27 ENCOUNTER — Telehealth: Payer: Self-pay | Admitting: Internal Medicine

## 2014-10-27 NOTE — Telephone Encounter (Signed)
Error

## 2014-12-16 NOTE — H&P (Signed)
PATIENT NAME:  [name], [name] MR#:  [id number] DATE OF BIRTH:  [birth year]  DATE OF ADMISSION:  07/24/2014  REFERRING PHYSICIAN: [facility].   ATTENDING PHYSICIAN: Jolanta B. Jennet Maduro, M.D.   IDENTIFYING DATA: [name] is a 29 year old female with history of bipolar disorder.   CHIEF COMPLAINT: "I need to be home."   HISTORY OF PRESENT ILLNESS: [name] is not able to provide a good history of events leading to her admission. Apparently, she was diagnosed with bipolar disorder 2 years ago. She is in the care of [facility]. She lives with her mother who helps her with medicines. She receives 117 mg of [place] every month, last injection was on November 25. In addition, she takes 6 mg of oral Invega daily. She is very vague about taking her oral medication, and, in the hospital this morning, it took the nurse over 10 minutes to convince the patient to take the medicine. I am suspicious that she is not taking the oral part of her treatment as she takes no other medications. She reports that over the Thanksgiving holidays, there was some family conflict. She is very vague about it. I have a feeling that she feels singled out when all her family members come to a visit with their families. They are all married and have children, and she feels inadequate. She is trying to find her own way to be independent and happy, and feels that they patronize her and treat her like a baby, which does not help. According to the chart, her mother petitioned her as the patient was complaining of a feeling of bugs crawling all over her. The patient denies having any tactile hallucinations. She says that sometimes she feels itchy and it makes her uncomfy. She denies auditory hallucinations currently; although, in the past, she did hear voices. She denies any symptoms of paranoia and feels safe in the hospital. She denies problems with sleep or appetite. She denies depressive symptoms. She denies  symptoms suggestive of bipolar mania. There is no alcohol use, illicit substance or prescription pill abuse.   PAST PSYCHIATRIC HISTORY: The patient is very vague. It is quite possible that she was hospitalized 5 or 6 times according to her own account. This is her first time at [facility]. In the past, she has been treated with other medications. She is ALLERGIC TO SEROQUEL, but feels that Geodon worked best for her.   FAMILY PSYCHIATRIC HISTORY: On her father's side, there is someone with Tourette's and someone else with mental illness, but she does not know any details.   PAST MEDICAL HISTORY: None.   ALLERGIES: SEROQUEL.   MEDICATIONS ON ADMISSION: Hinda Glatter Sustenna 117 mg every 4 weeks, last injection, November 25. Invega 6 mg in the morning.   SOCIAL HISTORY: She graduated from high school and has some college. She is now staying home with her mother. She does not receive disability and has no health insurance.   REVIEW OF SYSTEMS:   CONSTITUTIONAL: No fevers or chills. No weight changes.  EYES: No double or blurred vision.  ENT: No hearing loss.  RESPIRATORY: No shortness of breath or cough.  CARDIOVASCULAR: No chest pain or orthopnea.  GASTROINTESTINAL: No abdominal pain, nausea, vomiting, or diarrhea.  GENITOURINARY: No incontinence or frequency.  ENDOCRINE: No heat or cold intolerance.  LYMPHATIC: No anemia or easy bruising.  INTEGUMENTARY: No acne or rash.  MUSCULOSKELETAL: No muscle or joint pain.  NEUROLOGIC: No tingling or  weakness.  PSYCHIATRIC: See history of present illness for details.   PHYSICAL EXAMINATION:  VITAL SIGNS: Blood pressure 102/69, pulse 80, respirations 20, temperature 98.6.  GENERAL: This is a slightly obese young female in no acute distress.  HEENT: The pupils are equal, round and reactive to light. Sclerae are anicteric.  NECK: Supple. No thyromegaly.  LUNGS: Clear to auscultation. No dullness to percussion.  HEART: Regular  rhythm and rate. No murmurs, rubs or gallops.  ABDOMEN: Soft, nontender, nondistended. Positive bowel sounds.  MUSCULOSKELETAL: Normal muscle strength in all extremities.  SKIN: No rashes or bruises.  LYMPHATIC: No cervical adenopathy.  NEUROLOGIC: Cranial nerves II through XII are intact.   LABORATORY DATA: [facility] were all [facility] and were within normal limits. CBC and chemistries were normal. Urine toxicology screen was negative for substances. Urinalysis was not suggestive of urinary tract infection. Blood alcohol level was 0.   MENTAL STATUS EXAMINATION ON ADMISSION: The patient is alert and oriented to person, place, time, and somewhat to situation. She is pleasant, polite and cooperative. She is not a good historian and is not able to provide many details. She maintains good eye contact. She is well groomed, wearing hospital scrubs. Her speech is of normal rhythm, rate and volume, rather soft. Mood is fine with an active affect. Thought process is logical. Thought content: She denies thoughts of hurting herself or others. There are no paranoid delusions. She was admitted after she complained of tactile hallucinations. She denies auditory or visual hallucinations. Her cognition is grossly intact. Registration, recall, short and long-term memory are intact. She is of average intelligence and fund of knowledge. Her insight and judgment are fair.   SUICIDE RISK ASSESSMENT ON ADMISSION: This is a patient with history of bipolar disorder, who was petitioned by her mother for new onset tactile hallucinations in spite of decent treatment compliance, but in the context of a family conflict.   INITIAL DIAGNOSES:  AXIS I: Schizoaffective disorder, bipolar type.  AXIS II: Deferred.  AXIS III: Deferred.   PLAN: The patient was admitted to [facility] for safety, stabilization and medication management.   1. Psychosis: We will  continue Invega 6 mg daily. We will contact her [facility] team. I would favor additional dose of Invega Sustenna to bring her total monthly dose to 234 mg, which is a regular dose in my opinion.  2. Insomnia. The patient complains that she has not been able to sleep. She received 2 mg of Ativan last night with excellent results. We will offer a sleeping aid.   DISPOSITION: She will be discharged to home with her mother. I tried to call the mother, but it goes straight to the voicemail. We will contact her [facility] team to see if they have any suggestions or if they agree to switch her to Geodon of her choosing.     ____________________________ Braulio ConteJolanta B. Jennet MaduroPucilowska, MD jbp:JT D: 07/25/2014 11:32:29 ET T: 07/25/2014 11:54:01 ET JOB#: [id number]  cc: Jolanta B. Jennet MaduroPucilowska, MD, <Dictator> Shari ProwsJOLANTA B PUCILOWSKA MD ELECTRONICALLY SIGNED 07/27/2014 17:25

## 2015-01-27 ENCOUNTER — Emergency Department (HOSPITAL_COMMUNITY)
Admission: EM | Admit: 2015-01-27 | Discharge: 2015-01-27 | Disposition: A | Discharge status: Home / Self Care | Payer: Medicaid Other | Attending: Emergency Medicine | Admitting: Emergency Medicine

## 2015-01-27 ENCOUNTER — Encounter (HOSPITAL_COMMUNITY): Payer: Self-pay | Admitting: *Deleted

## 2015-01-27 DIAGNOSIS — F419 Anxiety disorder, unspecified: Secondary | ICD-10-CM | POA: Insufficient documentation

## 2015-01-27 DIAGNOSIS — Z79899 Other long term (current) drug therapy: Secondary | ICD-10-CM | POA: Diagnosis not present

## 2015-01-27 DIAGNOSIS — Z008 Encounter for other general examination: Secondary | ICD-10-CM | POA: Diagnosis present

## 2015-01-27 LAB — COMPREHENSIVE METABOLIC PANEL
ALK PHOS: 51 U/L (ref 38–126)
ALT: 12 U/L — AB (ref 14–54)
ANION GAP: 10 (ref 5–15)
AST: 17 U/L (ref 15–41)
Albumin: 4.3 g/dL (ref 3.5–5.0)
BUN: 13 mg/dL (ref 6–20)
CALCIUM: 9.1 mg/dL (ref 8.9–10.3)
CO2: 24 mmol/L (ref 22–32)
CREATININE: 0.92 mg/dL (ref 0.44–1.00)
Chloride: 103 mmol/L (ref 101–111)
GFR calc Af Amer: >60 mL/min (ref >60)
GFR calc non Af Amer: >60 mL/min (ref >60)
Glucose, Bld: 95 mg/dL (ref 65–99)
Potassium: 3.9 mmol/L (ref 3.5–5.1)
SODIUM: 137 mmol/L (ref 135–145)
TOTAL PROTEIN: 7.6 g/dL (ref 6.5–8.1)
Total Bilirubin: 0.6 mg/dL (ref 0.3–1.2)

## 2015-01-27 LAB — CBC
HCT: 38.6 % (ref 36.0–46.0)
HEMOGLOBIN: 13 g/dL (ref 12.0–15.0)
MCH: 27.3 pg (ref 26.0–34.0)
MCHC: 33.7 g/dL (ref 30.0–36.0)
MCV: 80.9 fL (ref 78.0–100.0)
PLATELETS: 265 10*3/uL (ref 150–400)
RBC: 4.77 MIL/uL (ref 3.87–5.11)
RDW: 13.9 % (ref 11.5–15.5)
WBC: 6.1 10*3/uL (ref 4.0–10.5)

## 2015-01-27 LAB — ETHANOL: Alcohol, Ethyl (B): <5 mg/dL (ref <5)

## 2015-01-27 NOTE — ED Notes (Signed)
Pt has history of Bipolar, mother concerned about pt and her acting out, loud and arguing with mother and would not take her meds until lunch time. Pt also appears a little agitated. Pt agreeing for evaluation to receive help.Pt feels there is something "ereie" in the apartment, requesting separation from mother and perhaps move to different location. Mother feels pt sleeps too long in the mornings.

## 2015-01-27 NOTE — ED Provider Notes (Signed)
CSN: [id number]     Arrival date & time 01/27/15  1419 History   First MD Initiated Contact with Patient 01/27/15 1550     Chief Complaint  Patient presents with  . Medical Clearance   [name] is a 29 y.o. female with a history of bipolar disorder who presents to the ED with her mother who reports they had an argument today and the mother wants her to speak to someone to help with her behavior. The patient reports that her and her mother argue at times and they had an argument today that was not physical. The mother threatened to take her to the hospital to control her behavior and she came voluntarily. The patient reports she has been taking her medications as prescribed. She has an appointment with her therapist next week. She reports she has been sleeping well. She endorsed low mood and anxiety. She denies suicidal or homicidal ideations. She denies visual or auditory hallucinations. She denies paranoia, thought insertion, thought withdrawal or thought broadcasting. She [facility] to [facility] for mental health treatment. The mother reports she did not bring her here to be admitted she just wanted her to talk to someone. The mother does not feel that she is a danger to herself or others. The mother feels comfortable taking her back home. The patient also feels comfortable going home and did not want to be here in the first place but came so willingly to appease her mother. The patient denies physical complaints. The patient has fevers, chills, cough, abdominal pain, nausea or vomiting.  (Consider location/radiation/quality/duration/timing/severity/associated sxs/prior Treatment) HPI  Past Medical History  Diagnosis Date  . Bipolar 1 disorder 2011   History reviewed. No pertinent past surgical history. Family History  Problem Relation Age of Onset  . Cancer Father     lymphoma  . Hypertension Mother   . Diabetes Neg Hx   . Heart disease Neg Hx    History  Substance Use Topics  . Smoking  status: Never Smoker   . Smokeless tobacco: Never Used  . Alcohol Use: No     Comment: Pt denies    OB History    No data available     Review of Systems  Constitutional: Negative for fever and chills.  HENT: Negative for sore throat.   Eyes: Negative for visual disturbance.  Respiratory: Negative for cough and shortness of breath.   Cardiovascular: Negative for chest pain.  Gastrointestinal: Negative for nausea, vomiting and abdominal pain.  Genitourinary: Negative for dysuria.  Musculoskeletal: Negative for back pain and neck pain.  Skin: Negative for rash.  Neurological: Negative for headaches.  Psychiatric/Behavioral: Negative for suicidal ideas, hallucinations, confusion, sleep disturbance, self-injury, dysphoric mood and decreased concentration. The patient is nervous/anxious.       Allergies  Quetiapine and Yes for women  Home Medications   Prior to Admission medications   Medication Sig Start Date End Date Taking? Authorizing Provider  Coconut Oil 1000 MG CAPS Take 2 capsules by mouth 2 (two) times daily.   Yes Historical Provider, MD   BP 110/73 mmHg  Pulse 63  Temp(Src) 98.3 F (36.8 C) (Oral)  Resp 18  SpO2 100%  LMP 01/27/2015 Physical Exam  Constitutional: She is oriented to person, place, and time. She appears well-developed and well-nourished. No distress.  Nontoxic appearing.  HENT:  Head: Normocephalic and atraumatic.  Eyes: Conjunctivae are normal. Right eye exhibits no discharge. Left eye exhibits no discharge.  Pulmonary/Chest: Effort normal. No respiratory distress.  Neurological: She is alert and oriented to person, place, and time. Coordination normal.  Skin: Skin is warm and dry. No rash noted. She is not diaphoretic. No erythema. No pallor.  Psychiatric: Her behavior is normal. Her mood appears anxious. Her affect is not angry, not blunt, not labile and not inappropriate. Her speech is not rapid and/or pressured, not delayed, not tangential  and not slurred. She is not agitated, not aggressive, not hyperactive, not slowed, not withdrawn, not actively hallucinating and not combative. Thought content is not paranoid and not delusional. She does not exhibit a depressed mood. She expresses no homicidal and no suicidal ideation. She is communicative.  Patient appears slightly anxious. The patient denies suicidal or homicidal ideations. She denies visual or auditory hallucinations. The patient does not appear to be responding to internal stimuli. Patient denies paranoia. Her speech is clear and coherent. Her thought process is goal directed. She is attentive.  Nursing note and vitals reviewed.   ED Course  Procedures (including critical care time) Labs Review Labs Reviewed  COMPREHENSIVE METABOLIC PANEL - Abnormal; Notable for the following:    ALT 12 (*)    All other components within normal limits  CBC  ETHANOL    Imaging Review No results found.   EKG Interpretation None      Filed Vitals:   01/27/15 1507 01/27/15 1725  BP: 118/69 110/73  Pulse: 59 63  Temp: 98.4 F (36.9 C) 98.3 F (36.8 C)  TempSrc: Oral Oral  Resp: 16 18  SpO2: 100% 100%     MDM   Meds given in ED:  Medications - No data to display  New Prescriptions   No medications on file    Final diagnoses:  Anxiety   This is a 29 y.o. female with a history of bipolar disorder who presents to the ED with her mother who reports they had an argument today and the mother wants her to speak to someone to help with her behavior. The patient reports that her and her mother argue at times and they had an argument today that was not physical. The mother threatened to take her to the hospital to control her behavior and she came voluntarily. The patient reports she has been taking her medications as prescribed. She has an appointment with her therapist next week. Mother brought her here to help with her behavior and wanted her to speak with someone. Mother did  not want her to stay tonight. The mother does not believe she is a danger to herself or others and feels comfortable taking her home tonight. The patient denies suicidal or homicidal ideations. She denies auditory or visual hallucinations. On exam the patient is afebrile nontoxic appearing. She does not appear to be responding to internal stimuli. Her speech is clear and coherent and normal rate and volume. She does not appear to be aggressive. I do not feel this patient would need or benefit from an inpatient psychiatric stay. Will discharge the patient and have her follow up with her therapist and [facility] at [facility]. Strict return precautions given. I advised the patient to follow-up with their primary care provider this week. I advised the patient to return to the emergency department with new or worsening symptoms or new concerns. The patient and her mother verbalized understanding and agreement with plan.    This patient was discussed with Dr. Fredderick PhenixBelfi who agrees with assessment and plan.    Everlene FarrierWilliam Caro Brundidge, PA-C 01/27/15 1737  Rolan BuccoMelanie Belfi, MD 01/27/15 [phone]

## 2015-01-27 NOTE — Discharge Instructions (Signed)
Generalized Anxiety Disorder Generalized anxiety disorder (GAD) is a mental disorder. It interferes with life functions, including relationships, work, and school. GAD is different from normal anxiety, which everyone experiences at some point in their lives in response to specific life events and activities. Normal anxiety actually helps us prepare for and get through these life events and activities. Normal anxiety goes away after the event or activity is over.  GAD causes anxiety that is not necessarily related to specific events or activities. It also causes excess anxiety in proportion to specific events or activities. The anxiety associated with GAD is also difficult to control. GAD can vary from mild to severe. People with severe GAD can have intense waves of anxiety with physical symptoms (panic attacks).  SYMPTOMS The anxiety and worry associated with GAD are difficult to control. This anxiety and worry are related to many life events and activities and also occur more days than not for 6 months or longer. People with GAD also have three or more of the following symptoms (one or more in children):  Restlessness.   Fatigue.  Difficulty concentrating.   Irritability.  Muscle tension.  Difficulty sleeping or unsatisfying sleep. DIAGNOSIS GAD is diagnosed through an assessment by your health care provider. Your health care provider will ask you questions aboutyour mood,physical symptoms, and events in your life. Your health care provider may ask you about your medical history and use of alcohol or drugs, including prescription medicines. Your health care provider may also do a physical exam and blood tests. Certain medical conditions and the use of certain substances can cause symptoms similar to those associated with GAD. Your health care provider may refer you to a mental health specialist for further evaluation. TREATMENT The following therapies are usually used to treat GAD:    Medication. Antidepressant medication usually is prescribed for long-term daily control. Antianxiety medicines may be added in severe cases, especially when panic attacks occur.   Talk therapy (psychotherapy). Certain types of talk therapy can be helpful in treating GAD by providing support, education, and guidance. A form of talk therapy called cognitive behavioral therapy can teach you healthy ways to think about and react to daily life events and activities.  Stress managementtechniques. These include yoga, meditation, and exercise and can be very helpful when they are practiced regularly. A mental health specialist can help determine which treatment is best for you. Some people see improvement with one therapy. However, other people require a combination of therapies. Document Released: 12/06/2012 Document Revised: 12/26/2013 Document Reviewed: 12/06/2012 ExitCare Patient Information 2015 [employer]. This information is not intended to replace advice given to you by your health care provider. Make sure you discuss any questions you have with your health care provider.  

## 2015-01-27 NOTE — ED Notes (Signed)
Patient's mother feels like she needs to be seen for agitation. Patient states she is on her menstral cycle and that is why she is acting that way. Patient denies SI/HI

## 2015-03-31 IMAGING — CR DG ANKLE COMPLETE 3+V*R*
3 series · 3 of 3 positions shown · non-contrast
Comparison: None.

CLINICAL DATA: Twisting injury to the right ankle while going down
stairs at a stool were. Initial encounter.

EXAM:
RIGHT ANKLE - COMPLETE 3+ VIEW

[ankle ap]
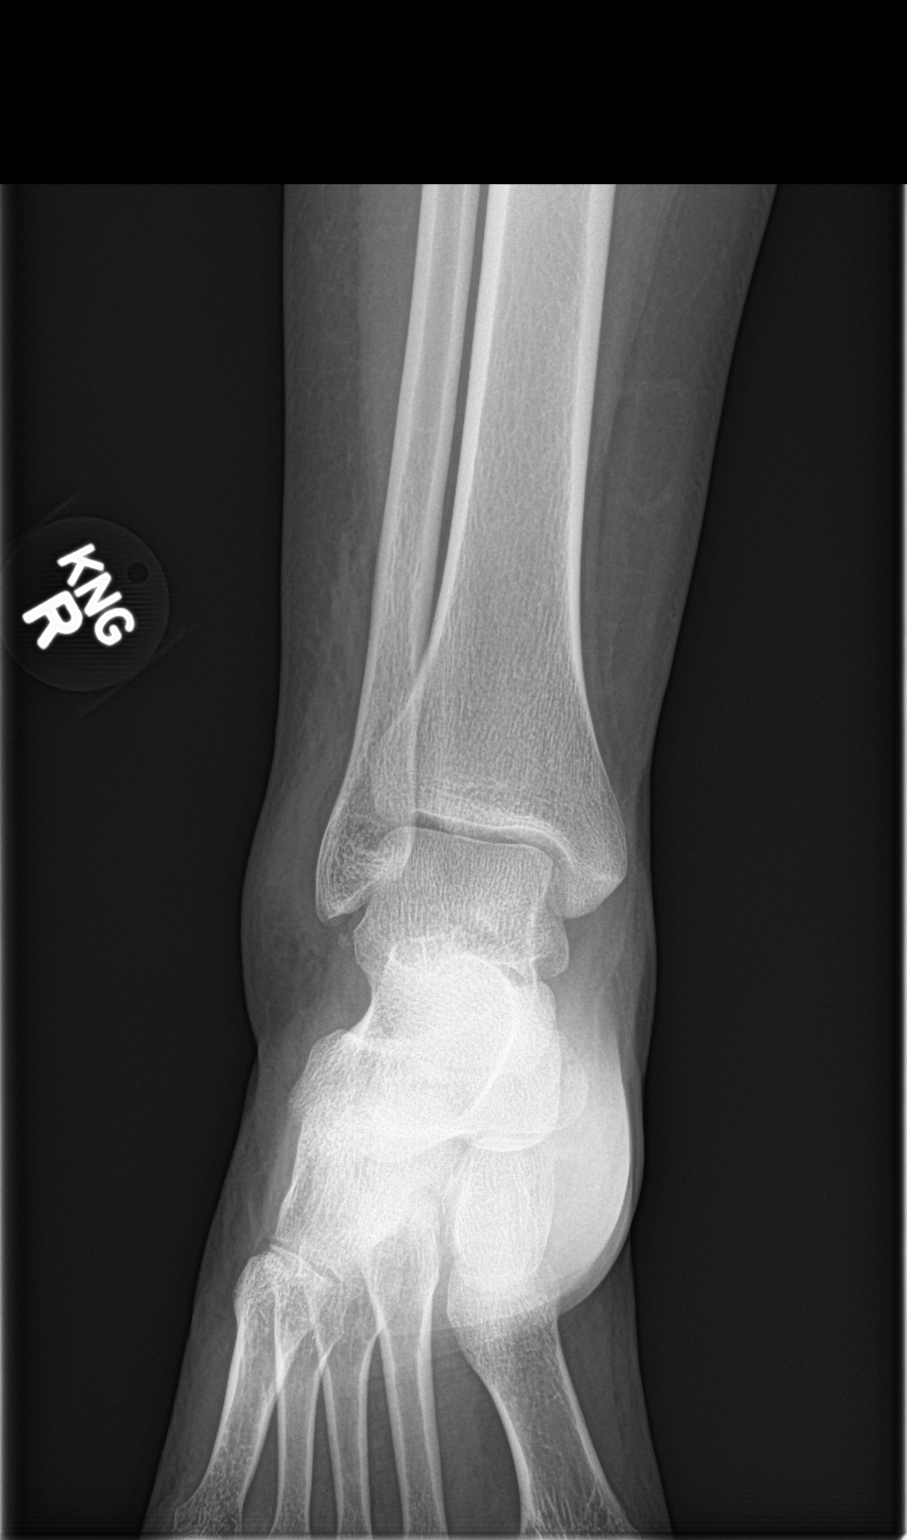

[ankle lat]
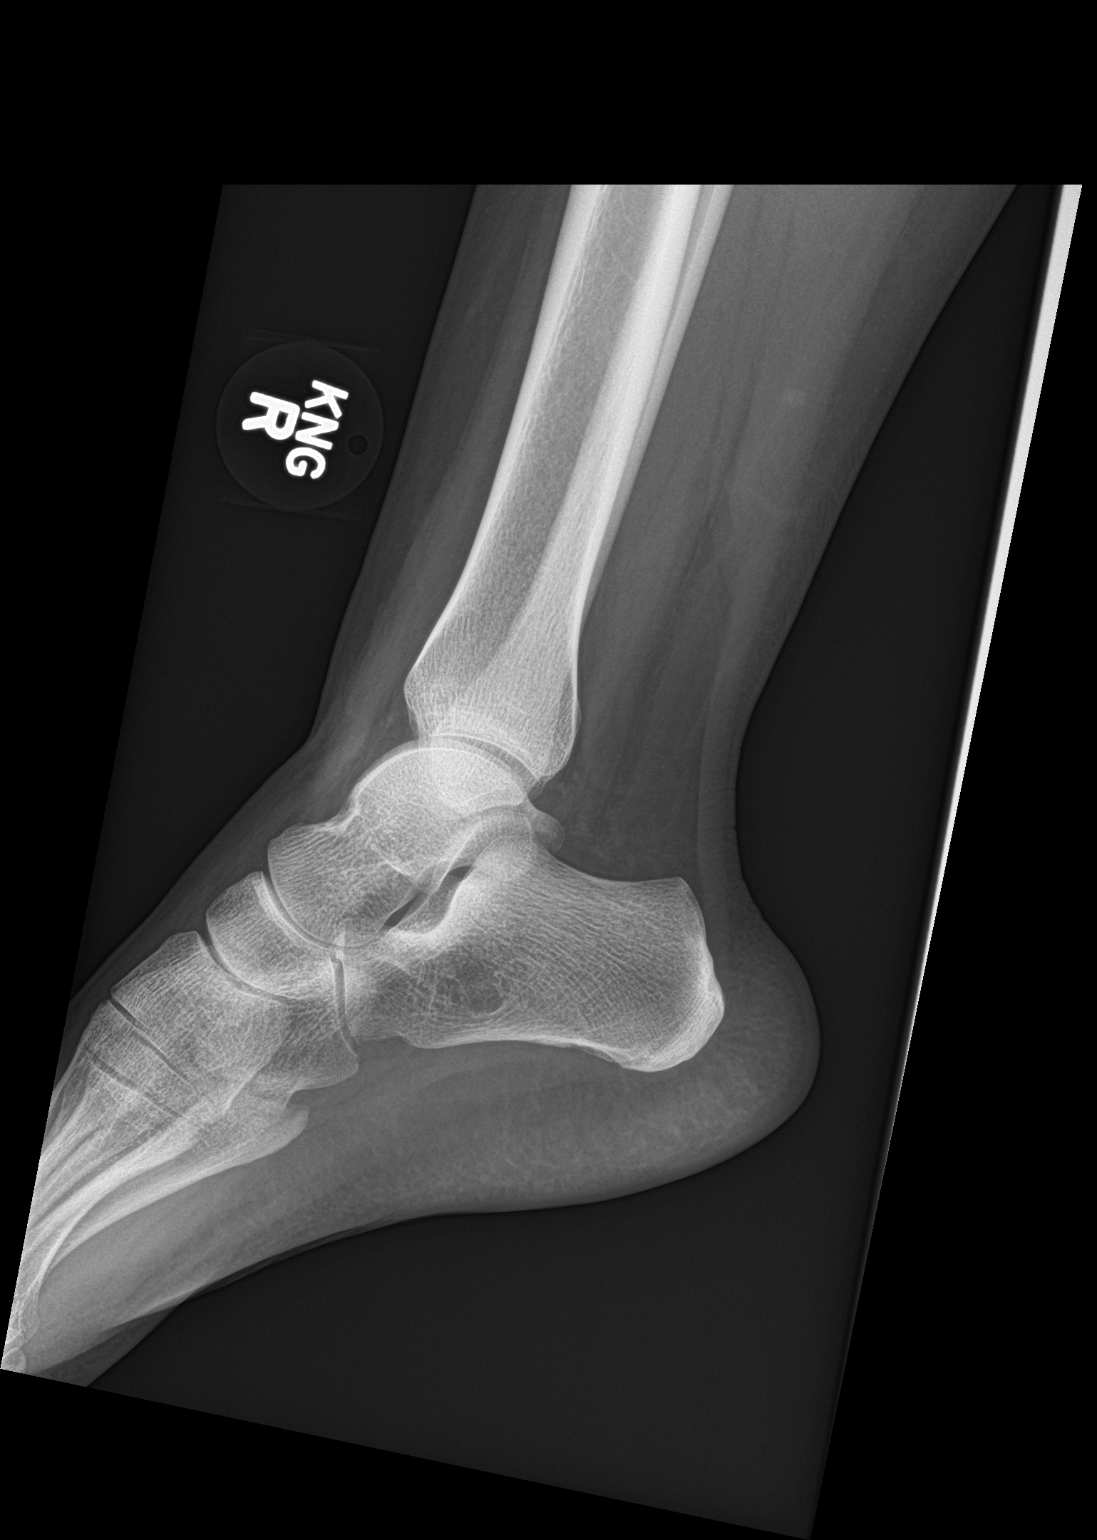

[ankle obl]
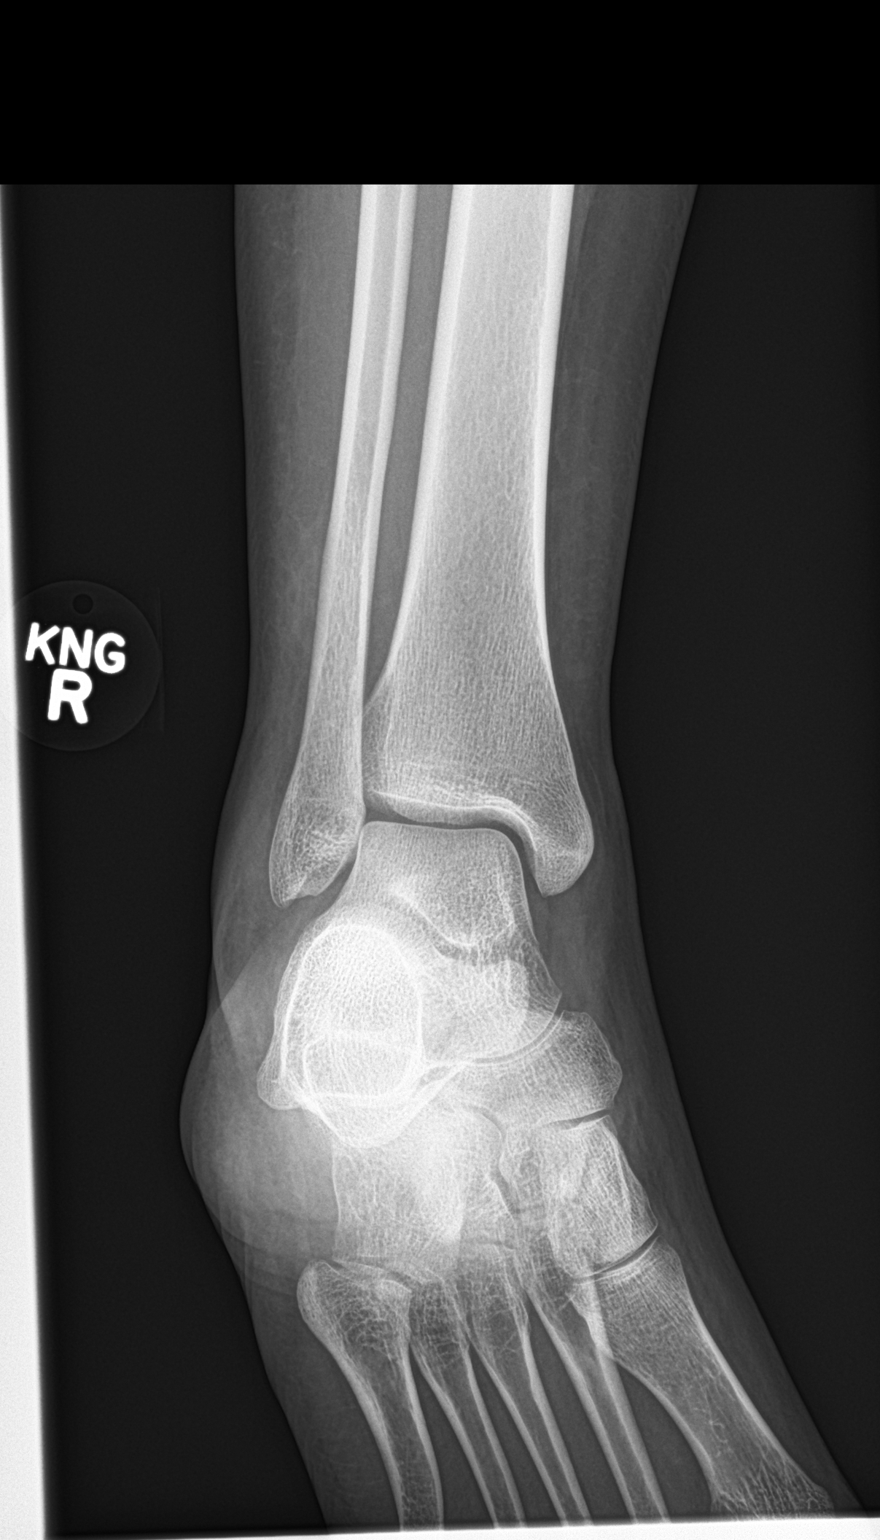

[3 of 3 positions shown; findings below may reference images not displayed]

FINDINGS: Lateral soft tissue swelling. Bone fragment adjacent to the lateral
talus, likely representing an avulsion fracture. No fractures
elsewhere. Ankle mortise intact with well preserved joint space.
Well preserved bone mineral density. No visible joint effusion or
hemarthrosis.
IMPRESSION: Avulsion fracture arising from the lateral talus at the insertion of
the lateral ligament complex. No other fractures.

## 2015-06-22 ENCOUNTER — Encounter: Payer: Medicaid Other | Admitting: Family Medicine

## 2015-06-22 DIAGNOSIS — Z0289 Encounter for other administrative examinations: Secondary | ICD-10-CM

## 2015-06-22 NOTE — Progress Notes (Signed)
No show  This encounter was created in error - please disregard.

## 2015-07-31 ENCOUNTER — Ambulatory Visit: Payer: Self-pay | Admitting: Certified Nurse Midwife

## 2015-08-02 ENCOUNTER — Ambulatory Visit: Payer: Self-pay | Admitting: Certified Nurse Midwife

## 2015-08-05 ENCOUNTER — Ambulatory Visit: Payer: Medicaid Other

## 2015-08-07 ENCOUNTER — Encounter (HOSPITAL_COMMUNITY): Payer: Self-pay | Admitting: Emergency Medicine

## 2015-08-07 ENCOUNTER — Emergency Department (INDEPENDENT_AMBULATORY_CARE_PROVIDER_SITE_OTHER)
Admission: EM | Admit: 2015-08-07 | Discharge: 2015-08-07 | Disposition: A | Discharge status: Home / Self Care | Payer: Medicaid Other | Source: Home / Self Care | Attending: Emergency Medicine | Admitting: Emergency Medicine

## 2015-08-07 DIAGNOSIS — H109 Unspecified conjunctivitis: Secondary | ICD-10-CM | POA: Diagnosis not present

## 2015-08-07 MED ORDER — POLYMYXIN B-TRIMETHOPRIM 10000-0.1 UNIT/ML-% OP SOLN: 1.0000 [drp] | Freq: Four times a day (QID) | OPHTHALMIC | Status: DC

## 2015-08-07 NOTE — Discharge Instructions (Signed)
You have pinkeye. Use the eyedrops 4 times a day for the next 5 days. The eyedrops you got over-the-counter you can use every 4 hours as needed. This should improve over the next 24-48 hours. Make sure you wash your hands frequently to prevent spreading it to the other eye. Follow-up as needed.

## 2015-08-07 NOTE — ED Notes (Signed)
Pt here with c/o right eye irritation, burn and slight drainage Started yesterday,denies foreign object Denies blurry vision, dizziness or headache Tried eye drops without relief

## 2015-08-07 NOTE — ED Provider Notes (Signed)
CSN: [id number]     Arrival date & time 08/07/15  1314 History   First MD Initiated Contact with Patient 08/07/15 1342     Chief Complaint  Patient presents with  . Eye Pain   (Consider location/radiation/quality/duration/timing/severity/associated sxs/prior Treatment) HPI  She is a 29 year old woman here for evaluation of right eye irritation. She states this started yesterday with redness to her right eye. She also reports a gritty feeling. She denies any photophobia or change in her vision. She has seen some white drainage from the white eye. She denies any crusting.  She states she had an eyelash in her eye yesterday, but it came out. She denies any other foreign body sensation.   Past Medical History  Diagnosis Date  . Bipolar 1 disorder (HCC) 2011   History reviewed. No pertinent past surgical history. Family History  Problem Relation Age of Onset  . Cancer Father     lymphoma  . Hypertension Mother   . Diabetes Neg Hx   . Heart disease Neg Hx    Social History  Substance Use Topics  . Smoking status: Never Smoker   . Smokeless tobacco: Never Used  . Alcohol Use: No     Comment: Pt denies    OB History    No data available     Review of Systems As in history of present illness Allergies  Quetiapine and Yes for women  Home Medications   Prior to Admission medications   Medication Sig Start Date End Date Taking? Authorizing Provider  Coconut Oil 1000 MG CAPS Take 2 capsules by mouth 2 (two) times daily.    Historical Provider, MD  trimethoprim-polymyxin b (POLYTRIM) ophthalmic solution Place 1 drop into the right eye 4 (four) times daily. For 5 days 08/07/15   Charm RingsErin J Loann Chahal, MD   Meds Ordered and Administered this Visit  Medications - No data to display  BP 119/73 mmHg  Pulse 58  Temp(Src) 98.2 F (36.8 C) (Oral)  Resp 20  SpO2 100% No data found.   Physical Exam  Constitutional: She is oriented to person, place, and time. She appears well-developed and  well-nourished. No distress.  Eyes: Pupils are equal, round, and reactive to light. Lids are everted and swept, no foreign bodies found. Right conjunctiva is injected. Right conjunctiva has no hemorrhage. Left conjunctiva is not injected. Left conjunctiva has no hemorrhage.  Cardiovascular: Normal rate.   Pulmonary/Chest: Effort normal.  Neurological: She is alert and oriented to person, place, and time.    ED Course  Procedures (including critical care time)  Labs Review Labs Reviewed - No data to display  Imaging Review No results found.    MDM   1. Conjunctivitis of right eye    Treatment with Polytrim eyedrops. She has used an over-the-counter eye irritation drop. It is okay to continue this as needed. Follow-up as needed.    Charm RingsErin J Arwyn Besaw, MD 08/07/15 [phone]

## 2015-09-20 ENCOUNTER — Ambulatory Visit: Payer: Self-pay | Admitting: Certified Nurse Midwife

## 2016-01-27 ENCOUNTER — Encounter (HOSPITAL_COMMUNITY): Payer: Self-pay | Admitting: Emergency Medicine

## 2016-01-27 ENCOUNTER — Emergency Department (HOSPITAL_COMMUNITY)
Admission: EM | Admit: 2016-01-27 | Discharge: 2016-01-27 | Disposition: A | Discharge status: Home / Self Care | Payer: Medicaid Other | Attending: Emergency Medicine | Admitting: Emergency Medicine

## 2016-01-27 DIAGNOSIS — F319 Bipolar disorder, unspecified: Secondary | ICD-10-CM | POA: Insufficient documentation

## 2016-01-27 DIAGNOSIS — Z760 Encounter for issue of repeat prescription: Secondary | ICD-10-CM | POA: Insufficient documentation

## 2016-01-27 DIAGNOSIS — Z79899 Other long term (current) drug therapy: Secondary | ICD-10-CM | POA: Insufficient documentation

## 2016-01-27 MED ORDER — BENZTROPINE MESYLATE 0.5 MG PO TABS: 0.5000 mg | ORAL_TABLET | Freq: Two times a day (BID) | ORAL | Status: DC

## 2016-01-27 MED ORDER — HALOPERIDOL 10 MG PO TABS: 10.0000 mg | ORAL_TABLET | Freq: Every day | ORAL | Status: DC

## 2016-01-27 NOTE — ED Provider Notes (Signed)
CSN: [id number]     Arrival date & time 01/27/16  1650 History  By signing my name below, I, [facility] Hospital[facility]arr[facility] [name], attest that this documentation has been prepared under the direction and in the presence of [name], PA-C. Electronically Signed: Randell PatientMarrissa [name], ED Scribe. 01/27/2016. 5:14 PM.   Chief Complaint  Patient presents with  . Medication Refill    The history is provided by the patient. No language interpreter was used.  HPI Comments: [name] is a 30 y.o. female who presents to the Emergency Department for a medication refill. Pt states that [facility] and currently takes Haldol 10 mg once at night and benztropine BID once in the morning and once in the evening. She notes that she delayed her appointment for too long and that [facility] over the weekend.. Denies any complaints at this time.   Past Medical History  Diagnosis Date  . Bipolar 1 disorder (HCC) 2011   History reviewed. No pertinent past surgical history. Family History  Problem Relation Age of Onset  . Cancer Father     lymphoma  . Hypertension Mother   . Diabetes Neg Hx   . Heart disease Neg Hx    Social History  Substance Use Topics  . Smoking status: Never Smoker   . Smokeless tobacco: Never Used  . Alcohol Use: No     Comment: Pt denies    OB History    No data available     Review of Systems  Constitutional: Negative for activity change.  Respiratory: Negative for shortness of breath.   Gastrointestinal: Negative for vomiting.  Musculoskeletal: Negative for gait problem.  Allergic/Immunologic: Negative for immunocompromised state.  Neurological: Negative for speech difficulty.      Allergies  Quetiapine and Yes for women  Home Medications   Prior to Admission medications   Medication Sig Start Date End Date Taking? Authorizing Provider  benztropine (COGENTIN) 0.5 MG tablet Take 1 tablet (0.5 mg  total) by mouth 2 (two) times daily. 01/27/16   Trixie DredgeEmily Ketsia Linebaugh, PA-C  Coconut Oil 1000 MG CAPS Take 2 capsules by mouth 2 (two) times daily.    Historical Provider, MD  haloperidol (HALDOL) 10 MG tablet Take 1 tablet (10 mg total) by mouth at bedtime. 01/27/16   Trixie DredgeEmily Ilyaas Musto, PA-C  trimethoprim-polymyxin b (POLYTRIM) ophthalmic solution Place 1 drop into the right eye 4 (four) times daily. For 5 days 08/07/15   Charm RingsErin J Honig, MD   BP 104/68 mmHg  Pulse 81  Temp(Src) 98.4 F (36.9 C) (Oral)  Resp 18  SpO2 97%  LMP 01/09/2016 Physical Exam  Constitutional: She appears well-developed and well-nourished. No distress.  HENT:  Head: Normocephalic and atraumatic.  Neck: Neck supple.  Pulmonary/Chest: Effort normal.  Neurological: She is alert.  Skin: She is not diaphoretic.  Psychiatric:  Flat affect  Nursing note and vitals reviewed.   ED Course  Procedures   DIAGNOSTIC STUDIES: Oxygen Saturation is 97% on RA, normal by my interpretation.    COORDINATION OF CARE: 5:05 PM Will refill pt's prescriptions. Discussed treatment plan with pt at bedside and pt agreed to plan.   MDM   Final diagnoses:  Medication refill    Pt here for refill of psych medication. She is out because she did not plan ahead and make an appointment with her provider.  Medication is not a controlled substance. Will refill medication here x 5 days.  Medications and dosages reviewed with pharmacy tech. Discussed need to follow up with [facility] tomorrow.  Pt is safe for discharge at this time.    I personally performed the services described in this documentation, which was scribed in my presence. The recorded information has been reviewed and is accurate.    [name], PA-C 01/27/16 1748  [name], MD 01/27/16 2250

## 2016-01-27 NOTE — ED Notes (Signed)
Pt states she needs medication refill of Benztropine and Haldol. States this is her first day that she will be without it tonight.

## 2016-01-27 NOTE — Discharge Instructions (Signed)
Medicine Refill at the Emergency Department  We have refilled your medicine today, but it is best for you to get refills through your primary health care provider's office. In the future, please plan ahead so you do not need to get refills from the emergency department.  If the medicine we refilled was a maintenance medicine, you may have received only enough to get you by until you are able to see your regular health care provider.     This information is not intended to replace advice given to you by your health care provider. Make sure you discuss any questions you have with your health care provider.     Document Released: 11/28/2003 Document Revised: 09/01/2014 Document Reviewed: 11/18/2013  Elsevier Interactive Patient Education 2016 [employer]vier Inc[employer]

## 2016-02-04 ENCOUNTER — Encounter (HOSPITAL_COMMUNITY): Payer: Self-pay | Admitting: Emergency Medicine

## 2016-02-04 ENCOUNTER — Emergency Department (HOSPITAL_COMMUNITY)
Admission: EM | Admit: 2016-02-04 | Discharge: 2016-02-04 | Disposition: A | Discharge status: Home / Self Care | Payer: Medicaid Other | Attending: Emergency Medicine | Admitting: Emergency Medicine

## 2016-02-04 DIAGNOSIS — F319 Bipolar disorder, unspecified: Secondary | ICD-10-CM | POA: Insufficient documentation

## 2016-02-04 DIAGNOSIS — Z79899 Other long term (current) drug therapy: Secondary | ICD-10-CM | POA: Insufficient documentation

## 2016-02-04 DIAGNOSIS — Z760 Encounter for issue of repeat prescription: Secondary | ICD-10-CM | POA: Diagnosis not present

## 2016-02-04 MED ORDER — BENZTROPINE MESYLATE 0.5 MG PO TABS: 0.5000 mg | ORAL_TABLET | Freq: Two times a day (BID) | ORAL | Status: DC | PRN

## 2016-02-04 NOTE — Discharge Instructions (Signed)
Medicine Refill at the Emergency Department  We have refilled your medicine today, but it is best for you to get refills through your primary health care provider's office. In the future, please plan ahead so you do not need to get refills from the emergency department.  If the medicine we refilled was a maintenance medicine, you may have received only enough to get you by until you are able to see your regular health care provider.     This information is not intended to replace advice given to you by your health care provider. Make sure you discuss any questions you have with your health care provider.     Document Released: 11/28/2003 Document Revised: 09/01/2014 Document Reviewed: 11/18/2013  Elsevier Interactive Patient Education 2016 [employer]vier Inc[employer]

## 2016-02-04 NOTE — ED Provider Notes (Signed)
CSN: [id number]     Arrival date & time 02/04/16  1513 History  By signing my name below, I, Majel HomerPeyton Lee, attest that this documentation has been prepared under the direction and in the presence of non-physician practitioner, Terance HartKelly Becca Bayne, PA-C. Electronically Signed: Majel HomerPeyton Lee, Scribe. 02/04/2016. 4:05 PM.   Chief Complaint  Patient presents with  . Medication Refill   The history is provided by the patient. No language interpreter was used.   HPI Comments:  Atina P [name] is a 30 y.o. female with PMHx of bipolar 1 disorder, who presents to the Emergency Department complaining of medication refill. She states she takes benztropine BID for bipolar 1 disorder; either twice at night or once in the morning and at night. She reports she has not had any side-effects to Haldol but takes it as a precautionary measure. She notes she [facility]. She denies any other complaints.   Past Medical History  Diagnosis Date  . Bipolar 1 disorder (HCC) 2011   History reviewed. No pertinent past surgical history. Family History  Problem Relation Age of Onset  . Cancer Father     lymphoma  . Hypertension Mother   . Diabetes Neg Hx   . Heart disease Neg Hx    Social History  Substance Use Topics  . Smoking status: Never Smoker   . Smokeless tobacco: Never Used  . Alcohol Use: No     Comment: Pt denies    OB History    No data available     Review of Systems  Constitutional: Negative for activity change.  Neurological: Negative for speech difficulty.   Allergies  Quetiapine and Yes for women  Home Medications   Prior to Admission medications   Medication Sig Start Date End Date Taking? Authorizing Provider  benztropine (COGENTIN) 0.5 MG tablet Take 1 tablet (0.5 mg total) by mouth 2 (two) times daily. 01/27/16   Trixie DredgeEmily West, PA-C  Coconut Oil 1000 MG CAPS Take 2 capsules by mouth 2 (two) times daily.    Historical Provider, MD  haloperidol (HALDOL) 10 MG  tablet Take 1 tablet (10 mg total) by mouth at bedtime. 01/27/16   Trixie DredgeEmily West, PA-C  trimethoprim-polymyxin b (POLYTRIM) ophthalmic solution Place 1 drop into the right eye 4 (four) times daily. For 5 days 08/07/15   Charm RingsErin J Honig, MD   Triage Vitals: BP 102/66 mmHg  Pulse 56  Temp(Src) 98.1 F (36.7 C) (Oral)  Resp 16  SpO2 100%  LMP 01/09/2016  Physical Exam  Constitutional: She is oriented to person, place, and time. She appears well-developed and well-nourished. No distress.  HENT:  Head: Normocephalic and atraumatic.  Eyes: Conjunctivae are normal. Pupils are equal, round, and reactive to light. Right eye exhibits no discharge. Left eye exhibits no discharge. No scleral icterus.  Neck: Normal range of motion.  Cardiovascular: Normal rate.   Pulmonary/Chest: Effort normal. No respiratory distress.  Abdominal: She exhibits no distension.  Neurological: She is alert and oriented to person, place, and time.  Skin: Skin is warm and dry.  Psychiatric: Her speech is normal. Judgment and thought content normal. Her affect is blunt. She is withdrawn. Cognition and memory are normal.    ED Course  Procedures  DIAGNOSTIC STUDIES:  Oxygen Saturation is 100% on RA, normal by my interpretation.    COORDINATION OF CARE:  4:04 PM Discussed treatment plan, which includes medication refill of Benztropine with pt at bedside and pt agreed to plan.  MDM   Final diagnoses:  Medication refill   30 year old female who presents for med refill. Patient has an appointment on Thursday. Discussed with patient that she needs to make and keep regular follow up appointments when she goes on Thursday to avoid coming to the ED for med refills. Patient verbalized understanding. Patient is NAD, non-toxic. Patient is informed of clinical course, understands medical decision making process, and agrees with plan. Opportunity for questions provided and all questions answered. Return precautions given.  I  personally performed the services described in this documentation, which was scribed in my presence. The recorded information has been reviewed and is accurate.    Bethel Born, PA-C 02/04/16 1706  Lyndal Pulley, MD 02/05/16 [phone]

## 2016-02-04 NOTE — ED Notes (Addendum)
Pt is needing an Rx for Benztropine which she takes for S/E of Haldol.

## 2016-03-06 ENCOUNTER — Institutional Professional Consult (permissible substitution): Payer: Medicaid Other | Admitting: Obstetrics & Gynecology

## 2016-03-10 ENCOUNTER — Ambulatory Visit (INDEPENDENT_AMBULATORY_CARE_PROVIDER_SITE_OTHER): Payer: Medicaid Other | Admitting: Podiatry

## 2016-03-10 NOTE — Progress Notes (Signed)
No show

## 2016-03-25 ENCOUNTER — Institutional Professional Consult (permissible substitution): Payer: Self-pay | Admitting: Obstetrics & Gynecology

## 2016-04-22 ENCOUNTER — Institutional Professional Consult (permissible substitution): Payer: Self-pay | Admitting: Obstetrics and Gynecology

## 2016-05-21 ENCOUNTER — Encounter: Payer: Self-pay | Admitting: Obstetrics and Gynecology

## 2016-05-21 ENCOUNTER — Ambulatory Visit (INDEPENDENT_AMBULATORY_CARE_PROVIDER_SITE_OTHER): Payer: PRIVATE HEALTH INSURANCE | Admitting: Obstetrics and Gynecology

## 2016-05-21 VITALS — BP 109/75 | HR 60 | Temp 99.1°F | Wt 165.0 lb

## 2016-05-21 DIAGNOSIS — Z01419 Encounter for gynecological examination (general) (routine) without abnormal findings: Secondary | ICD-10-CM | POA: Diagnosis not present

## 2016-05-21 DIAGNOSIS — Z0000 Encounter for general adult medical examination without abnormal findings: Secondary | ICD-10-CM

## 2016-05-21 NOTE — Progress Notes (Signed)
Subjective:     [name] is a 30 y.o. female G0 with BMI 28 who is here for a comprehensive physical exam. The patient reports no problems. She is not currently sexually active. She is not on any type of birth control and is not interested in contraception. She reports monthly regular cycles with vaginal bleeding for 5 days. She denies pelvic pain or abnormal discharge  Past Medical History:  Diagnosis Date  . Bipolar 1 disorder (HCC) 2011   History reviewed. No pertinent surgical history. Family History  Problem Relation Age of Onset  . Cancer Father     lymphoma  . Hypertension Mother   . Diabetes Neg Hx   . Heart disease Neg Hx     Social History   Social History  . Marital status: Single    Spouse name: N/A  . Number of children: 0  . Years of education: some colle   Occupational History  . [employer]      trying to start    Social History Main Topics  . Smoking status: Never Smoker  . Smokeless tobacco: Never Used  . Alcohol use No     Comment: Pt denies   . Drug use: No     Comment: Pt denies  . Sexual activity: No   Other Topics Concern  . Not on file   Social History Narrative   Work or School: not working   Home Situation: lives with mother   Spiritual Beliefs: Christian   Lifestyle: no regular exercise; healthy diet            Health Maintenance  Topic Date Due  . INFLUENZA VACCINE  03/25/2016  . PAP SMEAR  06/21/2017  . TETANUS/TDAP  06/21/2024  . HIV Screening  Completed       Review of Systems Pertinent items are noted in HPI.   Objective:  Blood pressure 109/75, pulse 60, temperature 99.1 F (37.3 C), temperature source Oral, weight 165 lb (74.8 kg), last menstrual period 05/14/2016.     GENERAL: Well-developed, well-nourished female in no acute distress.  HEENT: Normocephalic, atraumatic. Sclerae anicteric.  NECK: Supple. Normal thyroid.  LUNGS: Clear to auscultation bilaterally.  HEART: Regular rate and rhythm. BREASTS:  Symmetric in size. No palpable masses or lymphadenopathy, skin changes, or nipple drainage. ABDOMEN: Soft, nontender, nondistended. No organomegaly. PELVIC: Normal external female genitalia. Vagina is pink and rugated.  Normal discharge. Normal appearing cervix. Uterus is normal in size.  No adnexal mass or tenderness. EXTREMITIES: No cyanosis, clubbing, or edema, 2+ distal pulses.    Assessment:    Healthy female exam.      Plan:    Pap smear collected Patient advised to perform monthly breast exam Patient will be contacted with any abnormal results RTC in 1 year or prn See After Visit Summary for Counseling Recommendations

## 2016-05-21 NOTE — Progress Notes (Signed)
Patient is in office for initial visit/annual.

## 2016-05-27 LAB — PAP IG W/ RFLX HPV ASCU: PAP Smear Comment: 0

## 2016-07-02 ENCOUNTER — Encounter (HOSPITAL_COMMUNITY): Payer: Self-pay | Admitting: Family Medicine

## 2016-07-02 ENCOUNTER — Ambulatory Visit (HOSPITAL_COMMUNITY)
Admission: EM | Admit: 2016-07-02 | Discharge: 2016-07-02 | Disposition: A | Discharge status: Home / Self Care | Payer: Medicaid Other | Attending: Family Medicine | Admitting: Family Medicine

## 2016-07-02 DIAGNOSIS — Z833 Family history of diabetes mellitus: Secondary | ICD-10-CM | POA: Diagnosis not present

## 2016-07-02 DIAGNOSIS — Z79899 Other long term (current) drug therapy: Secondary | ICD-10-CM | POA: Diagnosis not present

## 2016-07-02 DIAGNOSIS — Z888 Allergy status to other drugs, medicaments and biological substances status: Secondary | ICD-10-CM | POA: Diagnosis not present

## 2016-07-02 DIAGNOSIS — F449 Dissociative and conversion disorder, unspecified: Secondary | ICD-10-CM | POA: Insufficient documentation

## 2016-07-02 DIAGNOSIS — F22 Delusional disorders: Secondary | ICD-10-CM | POA: Insufficient documentation

## 2016-07-02 DIAGNOSIS — F250 Schizoaffective disorder, bipolar type: Secondary | ICD-10-CM | POA: Diagnosis not present

## 2016-07-02 DIAGNOSIS — M9905 Segmental and somatic dysfunction of pelvic region: Secondary | ICD-10-CM | POA: Diagnosis not present

## 2016-07-02 DIAGNOSIS — R682 Dry mouth, unspecified: Secondary | ICD-10-CM | POA: Insufficient documentation

## 2016-07-02 DIAGNOSIS — Z807 Family history of other malignant neoplasms of lymphoid, hematopoietic and related tissues: Secondary | ICD-10-CM | POA: Diagnosis not present

## 2016-07-02 DIAGNOSIS — Z8249 Family history of ischemic heart disease and other diseases of the circulatory system: Secondary | ICD-10-CM | POA: Diagnosis not present

## 2016-07-02 MED ORDER — HYDROCORTISONE 2.5 % RE CREA: TOPICAL_CREAM | RECTAL | 1 refills | Status: DC

## 2016-07-02 NOTE — Discharge Instructions (Signed)
Often when something bad has happened twice, we think there is something unclean or infectious. In your case, I think by taking the necessary medicine for a psychological problem, you are having a side effect that gives you the feeling of infection in your bottom. We are running test to make sure there is no infection there but what we are seeing is normal at this point.

## 2016-07-02 NOTE — ED Provider Notes (Signed)
[facility]    CSN: [id number] Arrival date & time: 07/02/16  1047     History   Chief Complaint Chief Complaint  Patient presents with  . Foreign Body in Rectum    HPI Zunairah P [name] is a 30 y.o. female.   This a 30 year old woman who comes in because she's worried that she may have an infection around her anus. She had anal intercourse 5 years ago and says that her bottom hasn't felt normal since. She goes to mental health and takes medicines and give her dry mouth.      Past Medical History:  Diagnosis Date  . Bipolar 1 disorder (HCC) 2011    Patient Active Problem List   Diagnosis Date Noted  . Somatic dysfunction of pelvic region 10/26/2014  . Paranoia (HCC)   . Schizoaffective disorder, bipolar type (HCC) 03/11/2012    History reviewed. No pertinent surgical history.  OB History    No data available       Home Medications    Prior to Admission medications   Medication Sig Start Date End Date Taking? Authorizing Provider  benztropine (COGENTIN) 0.5 MG tablet Take 1 tablet (0.5 mg total) by mouth 2 (two) times daily as needed for tremors. 02/04/16  Yes Bethel BornKelly Marie Gekas, PA-C  haloperidol (HALDOL) 10 MG tablet Take 1 tablet (10 mg total) by mouth at bedtime. 01/27/16  Yes Trixie DredgeEmily West, PA-C  Coconut Oil 1000 MG CAPS Take 2 capsules by mouth 2 (two) times daily.    Historical Provider, MD  hydrocortisone (ANUSOL-HC) 2.5 % rectal cream Apply rectally 2 times daily 07/02/16   Elvina SidleKurt Francis Doenges, MD  trimethoprim-polymyxin b (POLYTRIM) ophthalmic solution Place 1 drop into the right eye 4 (four) times daily. For 5 days Patient not taking: Reported on 05/21/2016 08/07/15   Charm RingsErin J Honig, MD    Family History Family History  Problem Relation Age of Onset  . Cancer Father     lymphoma  . Hypertension Mother   . Diabetes Neg Hx   . Heart disease Neg Hx     Social History Social History  Substance Use Topics  . Smoking status: Never Smoker  . Smokeless  tobacco: Never Used  . Alcohol use No     Comment: Pt denies      Allergies   Quetiapine and Yes for women [intimacy products]   Review of Systems Review of Systems  Constitutional: Negative.   HENT: Negative.   Respiratory: Negative.   Cardiovascular: Negative.   Gastrointestinal: Positive for rectal pain. Negative for anal bleeding.  Genitourinary: Negative.      Physical Exam Triage Vital Signs ED Triage Vitals [07/02/16 1123]  Enc Vitals Group     BP 117/74     Pulse Rate 71     Resp 18     Temp 98.6 F (37 C)     Temp src      SpO2 100 %     Weight      Height      Head Circumference      Peak Flow      Pain Score      Pain Loc      Pain Edu?      Excl. in GC?    No data found.   Updated Vital Signs BP 117/74   Pulse 71   Temp 98.6 F (37 C)   Resp 18   SpO2 100%      Physical Exam  Constitutional:  She is oriented to person, place, and time. She appears well-developed and well-nourished.  HENT:  Head: Normocephalic.  Right Ear: External ear normal.  Left Ear: External ear normal.  Mouth/Throat: Oropharynx is clear and moist.  Eyes: Conjunctivae and EOM are normal. Pupils are equal, round, and reactive to light.  Neck: Normal range of motion. Neck supple.  Cardiovascular: Normal rate and regular rhythm.   Pulmonary/Chest: Effort normal.  Abdominal: Soft. Bowel sounds are normal.  Normal interval verge  Musculoskeletal: Normal range of motion.  Lymphadenopathy:    She has no cervical adenopathy.  Neurological: She is alert and oriented to person, place, and time.  Nursing note and vitals reviewed.    UC Treatments / Results  Labs (all labs ordered are listed, but only abnormal results are displayed) Labs Reviewed  CYTOLOGY, (ORAL, ANAL, URETHRAL) ANCILLARY ONLY    EKG  EKG Interpretation None       Radiology No results found.  Procedures Procedures (including critical care time)  Medications Ordered in UC Medications -  No data to display   Initial Impression / Assessment and Plan / UC Course  I have reviewed the triage vital signs and the nursing notes.  Pertinent labs & imaging results that were available during my care of the patient were reviewed by me and considered in my medical decision making (see chart for details).  Clinical Course     Final Clinical Impressions(s) / UC Diagnoses   Final diagnoses:  Conversion reaction    New Prescriptions New Prescriptions   HYDROCORTISONE (ANUSOL-HC) 2.5 % RECTAL CREAM    Apply rectally 2 times daily     Elvina SidleKurt Bannon Giammarco, MD 07/02/16 1227

## 2016-07-02 NOTE — ED Triage Notes (Signed)
Pt stst hat she feels like there is bugs crawling inside of her rectum. sts she thinks STI. sts that she has been at behavioral health. sts no bites anywhere.

## 2016-07-03 LAB — CYTOLOGY, (ORAL, ANAL, URETHRAL) ANCILLARY ONLY
Chlamydia: NEGATIVE
Neisseria Gonorrhea: NEGATIVE

## 2017-07-12 ENCOUNTER — Other Ambulatory Visit: Payer: Self-pay

## 2017-07-12 ENCOUNTER — Emergency Department (HOSPITAL_COMMUNITY)
Admission: EM | Admit: 2017-07-12 | Discharge: 2017-07-13 | Disposition: A | Discharge status: Other Acute Inpatient Hospital | Payer: Medicaid Other | Attending: Emergency Medicine | Admitting: Emergency Medicine

## 2017-07-12 ENCOUNTER — Encounter (HOSPITAL_COMMUNITY): Payer: Self-pay | Admitting: *Deleted

## 2017-07-12 DIAGNOSIS — Z046 Encounter for general psychiatric examination, requested by authority: Secondary | ICD-10-CM | POA: Insufficient documentation

## 2017-07-12 DIAGNOSIS — F22 Delusional disorders: Secondary | ICD-10-CM | POA: Insufficient documentation

## 2017-07-12 DIAGNOSIS — F250 Schizoaffective disorder, bipolar type: Secondary | ICD-10-CM | POA: Diagnosis present

## 2017-07-12 DIAGNOSIS — R451 Restlessness and agitation: Secondary | ICD-10-CM | POA: Insufficient documentation

## 2017-07-12 DIAGNOSIS — Z9114 Patient's other noncompliance with medication regimen: Secondary | ICD-10-CM | POA: Insufficient documentation

## 2017-07-12 DIAGNOSIS — Z79899 Other long term (current) drug therapy: Secondary | ICD-10-CM | POA: Insufficient documentation

## 2017-07-12 HISTORY — DX: Schizoaffective disorder, unspecified: F25.9

## 2017-07-12 HISTORY — DX: Delusional disorders: F22

## 2017-07-12 LAB — COMPREHENSIVE METABOLIC PANEL
ALT: 12 U/L — ABNORMAL LOW (ref 14–54)
AST: 20 U/L (ref 15–41)
Albumin: 4.1 g/dL (ref 3.5–5.0)
Alkaline Phosphatase: 41 U/L (ref 38–126)
Anion gap: 8 (ref 5–15)
BUN: 10 mg/dL (ref 6–20)
CHLORIDE: 105 mmol/L (ref 101–111)
CO2: 25 mmol/L (ref 22–32)
Calcium: 9.3 mg/dL (ref 8.9–10.3)
Creatinine, Ser: 0.75 mg/dL (ref 0.44–1.00)
GFR calc Af Amer: >60 mL/min (ref >60)
GFR calc non Af Amer: >60 mL/min (ref >60)
Glucose, Bld: 117 mg/dL — ABNORMAL HIGH (ref 65–99)
POTASSIUM: 4 mmol/L (ref 3.5–5.1)
SODIUM: 138 mmol/L (ref 135–145)
Total Bilirubin: 0.3 mg/dL (ref 0.3–1.2)
Total Protein: 7.1 g/dL (ref 6.5–8.1)

## 2017-07-12 LAB — RAPID URINE DRUG SCREEN, HOSP PERFORMED
Amphetamines: NOT DETECTED
Barbiturates: NOT DETECTED
Benzodiazepines: NOT DETECTED
Cocaine: NOT DETECTED
OPIATES: NOT DETECTED
Tetrahydrocannabinol: NOT DETECTED

## 2017-07-12 LAB — CBC
HCT: 36.1 % (ref 36.0–46.0)
Hemoglobin: 12.4 g/dL (ref 12.0–15.0)
MCH: 27.6 pg (ref 26.0–34.0)
MCHC: 34.3 g/dL (ref 30.0–36.0)
MCV: 80.4 fL (ref 78.0–100.0)
PLATELETS: 284 10*3/uL (ref 150–400)
RBC: 4.49 MIL/uL (ref 3.87–5.11)
RDW: 13.8 % (ref 11.5–15.5)
WBC: 5.9 10*3/uL (ref 4.0–10.5)

## 2017-07-12 LAB — I-STAT BETA HCG BLOOD, ED (MC, WL, AP ONLY)

## 2017-07-12 MED ORDER — LORAZEPAM 2 MG/ML IJ SOLN
2.0000 mg | Freq: Once | INTRAMUSCULAR | Status: AC
  Administered 2017-07-12: 2 mg via INTRAMUSCULAR

## 2017-07-12 MED ORDER — ZIPRASIDONE MESYLATE 20 MG IM SOLR
INTRAMUSCULAR | Status: AC
  Administered 2017-07-12: 20 mg via INTRAMUSCULAR
  Filled 2017-07-12: qty 20

## 2017-07-12 MED ORDER — DIPHENHYDRAMINE HCL 50 MG/ML IJ SOLN
50.0000 mg | Freq: Once | INTRAMUSCULAR | Status: AC
  Administered 2017-07-12: 50 mg via INTRAMUSCULAR
  Filled 2017-07-12: qty 1

## 2017-07-12 MED ORDER — ZIPRASIDONE MESYLATE 20 MG IM SOLR: 20.0000 mg | Freq: Once | INTRAMUSCULAR | Status: DC

## 2017-07-12 MED ORDER — LORAZEPAM 2 MG/ML IJ SOLN
1.0000 mg | Freq: Once | INTRAMUSCULAR | Status: DC
  Filled 2017-07-12: qty 1

## 2017-07-12 MED ORDER — HALOPERIDOL 5 MG PO TABS
5.0000 mg | ORAL_TABLET | Freq: Two times a day (BID) | ORAL | Status: DC
Start: 2017-07-12 — End: 2017-07-14
  Filled 2017-07-12: qty 1

## 2017-07-12 MED ORDER — LORAZEPAM 2 MG/ML IJ SOLN
INTRAMUSCULAR | Status: AC
  Filled 2017-07-12: qty 1

## 2017-07-12 MED ORDER — BENZTROPINE MESYLATE 0.5 MG PO TABS
0.5000 mg | ORAL_TABLET | Freq: Every day | ORAL | Status: DC
  Filled 2017-07-12: qty 1

## 2017-07-12 MED ORDER — STERILE WATER FOR INJECTION IJ SOLN
INTRAMUSCULAR | Status: AC
  Administered 2017-07-12: 16:00:00
  Filled 2017-07-12: qty 10

## 2017-07-12 MED ORDER — ZIPRASIDONE MESYLATE 20 MG IM SOLR
20.0000 mg | Freq: Once | INTRAMUSCULAR | Status: AC
  Administered 2017-07-12: 20 mg via INTRAMUSCULAR

## 2017-07-12 MED ORDER — HALOPERIDOL LACTATE 5 MG/ML IJ SOLN
5.0000 mg | Freq: Once | INTRAMUSCULAR | Status: DC
  Filled 2017-07-12: qty 1

## 2017-07-12 NOTE — ED Notes (Signed)
Patient refuses all medication until she speaks to a physician.

## 2017-07-12 NOTE — Progress Notes (Signed)
Per TTS counselor EN, pt is sedated and unable to participate in assessment at this time.  [name], MSW, LCSW Therapeutic Triage Specialist  [phone]

## 2017-07-12 NOTE — ED Notes (Signed)
This nurse attempted to get patient changed out into scrubs. Patient removed her pants and her shoes, but refused to remove her bra, blouse, underwear or socks. Patient placed paper scrubs on top of her clothing. Patient states "These are my safety clothes. It is my right to keep them on." Patient gets very defensive and loud when this nurse asks patient to remove her clothing from under the paper scrubs. Patient states "I want to see my doctor! The doctor is above you!" Charge RN and security aware.

## 2017-07-12 NOTE — ED Notes (Signed)
Patient is resting comfortably. 

## 2017-07-12 NOTE — ED Notes (Signed)
Pt wanded by security [facility]

## 2017-07-12 NOTE — ED Notes (Signed)
Mother states pt has been off meds for several months. Mother states " she has been drinking hydrogen water which has been keeping her stable until 3 days ago. Today we got in car to go somewhere and she thought I was satan and had horns, that's when I called the Police." Pt is refusing to change or have labs obtained. Waiting at present for assessment and orders from EDP.

## 2017-07-12 NOTE — ED Notes (Signed)
Patient requesting her belongings so she can leave. EDPA notified

## 2017-07-12 NOTE — ED Notes (Signed)
On admission to the [facility] pt is asleep after receiving IM medications.

## 2017-07-12 NOTE — ED Triage Notes (Signed)
Pt brought in by [employer] vol, talking to self, paranoid, would not come to hospital with mother.

## 2017-07-12 NOTE — ED Provider Notes (Signed)
[facility]-EMERGENCY DEPT Provider Note   CSN: [id number] Arrival date & time: 07/12/17  1151     History   Chief Complaint Chief Complaint  Patient presents with  . Medical Clearance    HPI [name] is a 31 y.o. female presenting for psychiatric evaluation.  Level 5 caveat, as patient has psychiatric disorder.  Patient brought into ER by [employer] voluntarily.  Mom stated patient needs to go to the ER as she has been off her medication for several months, became very paranoid today.  She told her mom she looked like Satan with horns.  On arrival, patient continues to display paranoid behavior and disorganized thinking.  She will not let anyone touch her, and will not respond to nurses questions.  When I started to ask questions, patient states she cannot respond because she is "analyzing" everything that has happened in her life.  She started to tell her life story beginning with when she could hear people talking while she was in the womb. She states she does not think that her mom is her mom.  Patient went on to describe how each family member has made her feel 'out' and 'off,' and she does not think she belongs to her family.  She displays racing thoughts, no obvious response to auditory or visual stimuli.  Patient states that she wants 'vengeance' on her 'tormenters,' but will not state who this is.  She states she has not been taking her medications for the past 4 months.  She will not say why she stopped taking them.  She denies suicidal thoughts.  She denies auditory or visual hallucinations.    HPI  Past Medical History:  Diagnosis Date  . Bipolar 1 disorder (HCC) 2011  . Paranoia (HCC)   . Schizoaffective disorder [facility](HCC)     Patient Active Problem List   Diagnosis Date Noted  . Somatic dysfunction of pelvic region 10/26/2014  . Paranoia (HCC)   . Schizoaffective disorder, bipolar type (HCC) 03/11/2012    History reviewed. No pertinent surgical  history.  OB History    No data available       Home Medications    Prior to Admission medications   Medication Sig Start Date End Date Taking? Authorizing Provider  Multiple Vitamins-Minerals (MULTIVITAMIN WITH MINERALS) tablet Take 1 tablet daily by mouth.   Yes [provider]  benztropine (COGENTIN) 0.5 MG tablet Take 1 tablet (0.5 mg total) by mouth 2 (two) times daily as needed for tremors. Patient not taking: Reported on 07/12/2017 02/04/16   Bethel BornGekas, Kelly Marie, PA-C  Coconut Oil 1000 MG CAPS Take 2 capsules by mouth 2 (two) times daily.    [provider]  haloperidol (HALDOL) 10 MG tablet Take 1 tablet (10 mg total) by mouth at bedtime. Patient not taking: Reported on 07/12/2017 01/27/16   Trixie DredgeWest, Emily, PA-C  hydrocortisone (ANUSOL-HC) 2.5 % rectal cream Apply rectally 2 times daily Patient not taking: Reported on 07/12/2017 07/02/16   Elvina SidleLauenstein, Kurt, MD  trimethoprim-polymyxin b (POLYTRIM) ophthalmic solution Place 1 drop into the right eye 4 (four) times daily. For 5 days Patient not taking: Reported on 05/21/2016 08/07/15   Charm RingsHonig, Erin J, MD    Family History Family History  Problem Relation Age of Onset  . Cancer Father        lymphoma  . Hypertension Mother   . Diabetes Neg Hx   . Heart disease Neg Hx     Social History Social History  Tobacco Use  . Smoking status: Never Smoker  . Smokeless tobacco: Never Used  Substance Use Topics  . Alcohol use: No    Comment: Pt denies   . Drug use: No    Comment: Pt denies     Allergies   Quetiapine and Yes for women [intimacy products]   Review of Systems Review of Systems  Unable to perform ROS: Psychiatric disorder     Physical Exam Updated Vital Signs BP (!) 96/58 (BP Location: Left Arm)   Pulse 83   Temp 97.8 F (36.6 C) (Axillary)   Resp 16   SpO2 99%   Physical Exam  Constitutional: She appears well-developed and well-nourished. No distress.  HENT:  Head: Normocephalic and  atraumatic.  Eyes: EOM are normal.  Neck: Normal range of motion.  Pulmonary/Chest: Effort normal.  Abdominal: She exhibits no distension.  Musculoskeletal: Normal range of motion.  Neurological: She is alert.  Skin: Skin is warm.  Psychiatric: Her speech is rapid and/or pressured and tangential. She is agitated. Thought content is paranoid.  Patient speaking quickly, rambling, tangential thoughts, and racing thoughts.  Displaying paranoid behavior and disorganized thinking.  Not obviously responding to auditory or visual stimuli  Nursing note and vitals reviewed.    ED Treatments / Results  Labs (all labs ordered are listed, but only abnormal results are displayed) Labs Reviewed  COMPREHENSIVE METABOLIC PANEL - Abnormal; Notable for the following components:      Result Value   Glucose, Bld 117 (*)    ALT 12 (*)    All other components within normal limits  CBC  RAPID URINE DRUG SCREEN, HOSP PERFORMED  ETHANOL  I-STAT BETA HCG BLOOD, ED ([facility], [facility], [facility] ONLY)    EKG  EKG Interpretation None       Radiology No results found.  Procedures Procedures (including critical care time)  Medications Ordered in ED Medications  haloperidol (HALDOL) tablet 5 mg (5 mg Oral Refused 07/12/17 1400)  benztropine (COGENTIN) tablet 0.5 mg (0.5 mg Oral Refused 07/12/17 1400)  ziprasidone (GEODON) injection 20 mg (20 mg Intramuscular Given 07/12/17 1532)  diphenhydrAMINE (BENADRYL) injection 50 mg (50 mg Intramuscular Given 07/12/17 1612)  LORazepam (ATIVAN) injection 2 mg (2 mg Intramuscular Given 07/12/17 1533)  sterile water (preservative free) injection (  Given 07/12/17 1533)     Initial Impression / Assessment and Plan / ED Course  I have reviewed the triage vital signs and the nursing notes.  Pertinent labs & imaging results that were available during my care of the patient were reviewed by me and considered in my medical decision making (see chart for details).     Patient  presenting the emergency room for paranoid behavior.  She will not allow evaluation, and she is displaying mentally ill behavior that may be dangerous to herself or others.  IVC paperwork completed.  Patient given Ativan and Geodon to allow blood work and further assessment.  Blood work reassuring and urine.  Patient is medically cleared for TTS eval.  TTS unable to evaluate patient due to somnolence after medication.  TTS eval pending. Dispo to be set after eval.    Final Clinical Impressions(s) / ED Diagnoses   Final diagnoses:  Paranoia [facility](HCC)    ED Discharge Orders    None       Alveria ApleyCaccavale, Jeanmarie Mccowen, PA-C 07/12/17 2247    Mancel BaleWentz, Elliott, MD 07/13/17 [phone]

## 2017-07-13 MED ORDER — STERILE WATER FOR INJECTION IJ SOLN
INTRAMUSCULAR | Status: AC
  Administered 2017-07-13: 09:00:00
  Filled 2017-07-13: qty 10

## 2017-07-13 MED ORDER — DIPHENHYDRAMINE HCL 50 MG/ML IJ SOLN
50.0000 mg | Freq: Once | INTRAMUSCULAR | Status: AC
  Administered 2017-07-13: 50 mg via INTRAMUSCULAR
  Filled 2017-07-13: qty 1

## 2017-07-13 MED ORDER — ZIPRASIDONE MESYLATE 20 MG IM SOLR
20.0000 mg | Freq: Once | INTRAMUSCULAR | Status: AC
  Administered 2017-07-13: 20 mg via INTRAMUSCULAR
  Filled 2017-07-13: qty 20

## 2017-07-13 NOTE — BH Assessment (Signed)
[facility] Assessment Progress Note  Per Thedore MinsMojeed Akintayo, MD, this pt requires psychiatric hospitalization.  Malva LimesLinsey Strader, RN, [facility] in anticipation of a discharge planned for later today.  Pt presents under IVC initiated by EDP Mancel BaleElliott Wentz, and [facility].  Pt's nurse, Morrie Sheldonshley, has been notified, and agrees to call report to [phone] when the time comes.  Pt is to be transported via [employer].   Doylene Canninghomas Elsi Stelzer, MA Triage Specialist [phone]

## 2017-07-13 NOTE — Progress Notes (Signed)
07/13/17 1350:  LRT informed by staff to let pt remain in room.   Caroll RancherMarjette Sharone Almond, LRT/CTRS

## 2017-07-13 NOTE — ED Notes (Signed)
Pt behavior bizarre, disorganized, not redirectable, pt responding to internal stimuli, pt referencing spiders and cobwebs. This nurse notified Laurie,NP of pt behavior. Awaiting orders.

## 2017-07-13 NOTE — ED Notes (Signed)
Pt taking a shower 

## 2017-07-13 NOTE — ED Notes (Signed)
Pt refusing PO medication. Jacki ConesLaurie NP notified.

## 2017-07-13 NOTE — ED Notes (Signed)
[facility] [employer] contacted for transport

## 2017-07-13 NOTE — BH Assessment (Signed)
Tele Assessment Note   Patient Name: [name] MRN: [id number] Referring Physician: Alveria Apleyaccavale, Sophia, PA-C Location of Patient: [facility] Location of Provider: Other: [facility]  [name] is an 31 y.o. female brought to [facility] by [employer] voluntarily. Patient's mom report patient is paranoid and has been off her medication for several months. Patient is poor historian information for the assessment retrieved from documentation in patient's file. Per report patient told her mother she looked like Satan with horns. On arrival, patient continues to display paranoid behavior and disorganized thinking.  She will not let anyone touch her, and will not respond to nurses questions. Patient was responding to questions in an tangential manner.  She displays racing thoughts, no obvious response to auditory or visual stimuli.  Patient states that she wants 'vengeance' on her 'tormenters,' but will not state who this is.  She states she has not been taking her medications for the past 4 months.  She will not say why she stopped taking them.  She denies suicidal thoughts.  She denies auditory or visual hallucinations.   Per report patient has been diagnosed with schizoaffective disorder, bipolar, type.    Diagnosis: F25.0   Schizoaffective disorder, Bipolar type  Past Medical History:  Past Medical History:  Diagnosis Date  . Bipolar 1 disorder (HCC) 2011  . Paranoia (HCC)   . Schizoaffective disorder (HCC)     History reviewed. No pertinent surgical history.  Family History:  Family History  Problem Relation Age of Onset  . Cancer Father        lymphoma  . Hypertension Mother   . Diabetes Neg Hx   . Heart disease Neg Hx     Social History:  reports that  has never smoked. she has never used smokeless tobacco. She reports that she does not drink alcohol or use drugs.  Additional Social History:  Alcohol / Drug Use Pain Medications: See MAR Prescriptions: See MAR Over the Counter: See  MAR History of alcohol / drug use?: No history of alcohol / drug abuse  CIWA: CIWA-Ar BP: 123/90 Pulse Rate: 79 COWS:    PATIENT STRENGTHS: (choose at least two) Physical Health Supportive family/friends  Allergies:  Allergies  Allergen Reactions  . Quetiapine Other (See Comments)    Facial skin irritation  . Yes For Women [Intimacy Products] Rash    Home Medications:  (Not in a hospital admission)  OB/GYN Status:  No LMP recorded.  General Assessment Data Assessment unable to be completed: Yes Reason for not completing assessment: Per TTS counselor EN, pt is sedated and unable to participate in assessment at this time. Location of Assessment: [facility] ED TTS Assessment: In system Is this a Tele or Face-to-Face Assessment?: Face-to-Face Is this an Initial Assessment or a Re-assessment for this encounter?: Initial Assessment Marital status: Single Is patient pregnant?: No Pregnancy Status: No Living Arrangements: Parent Can pt return to current living arrangement?: Yes Admission Status: Involuntary Is patient capable of signing voluntary admission?: No Referral Source: Self/Family/Friend Insurance type: Medicaid     Crisis Care Plan Living Arrangements: Parent     Risk to self with the past 6 months Suicidal Ideation: No Has patient been a risk to self within the past 6 months prior to admission? : No Suicidal Intent: No Has patient had any suicidal intent within the past 6 months prior to admission? : No Is patient at risk for suicide?: No Suicidal Plan?: No Has patient had any suicidal plan within the past  6 months prior to admission? : No Access to Means: No What has been your use of drugs/alcohol within the last 12 months?: n/a Previous Attempts/Gestures: No Other Self Harm Risks: none reported Intentional Self Injurious Behavior: None Family Suicide History: Unable to assess Persecutory voices/beliefs?: Yes Substance abuse history and/or treatment for  substance abuse?: No  Risk to Others within the past 6 months Homicidal Ideation: No Does patient have any lifetime risk of violence toward others beyond the six months prior to admission? : No Thoughts of Harm to Others: No Current Homicidal Intent: No Current Homicidal Plan: No Access to Homicidal Means: No Identified Victim: n/a History of harm to others?: No Assessment of Violence: None Noted Violent Behavior Description: none noted Does 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 to weapons?: No Criminal Charges Pending?: No Does patient have a court date: No Is patient on probation?: No  Psychosis Hallucinations: Auditory, Visual  Mental Status Report Appearance/Hygiene: In scrubs, In hospital gown Eye Contact: Poor Motor Activity: Freedom of movement Speech: Tangential, Echolalia Level of Consciousness: Alert Mood: Pleasant Affect: Inconsistent with thought content Anxiety Level: None Thought Processes: Tangential Judgement: Impaired Orientation: Unable to assess  Cognitive Functioning Concentration: Poor Memory: Unable to Assess IQ: Above Average Insight: Poor Impulse Control: Fair Appetite: (UTA) Sleep: Unable to Assess  ADLScreening [facility] Assessment Services) Patient's cognitive ability adequate to safely complete daily activities?: Yes Patient able to express need for assistance with ADLs?: Yes Independently performs ADLs?: Yes (appropriate for developmental age)        ADL Screening (condition at time of admission) Patient's cognitive ability adequate to safely complete daily activities?: Yes Is the patient deaf or have difficulty hearing?: No Does the patient have difficulty seeing, even when wearing glasses/contacts?: No Does the patient have difficulty concentrating, remembering, or making decisions?: No Patient able to express need for assistance with ADLs?: Yes Does the patient have difficulty dressing or bathing?: No Independently performs ADLs?: Yes (appropriate  for developmental age) Does the patient have difficulty walking or climbing stairs?: No       Abuse/Neglect Assessment (Assessment to be complete while patient is alone) Abuse/Neglect Assessment Can Be Completed: Unable to assess, patient is non-responsive or altered mental status     Advance Directives (For Healthcare) Does Patient Have a Medical Advance Directive?: No Would patient like information on creating a medical advance directive?: No - Patient declined    Additional Information 1:1 In Past 12 Months?: No CIRT Risk: No Elopement Risk: No Does patient have medical clearance?: Yes     Disposition:  Disposition Initial Assessment Completed for this Encounter: Yes Disposition of Patient: Inpatient treatment program Type of inpatient treatment program: Adult     Dian SituDelvondria Pinky Ravan 07/13/2017 6:45 PM

## 2017-07-13 NOTE — ED Notes (Signed)
Patient is irritable and continues to refuse vital signs.

## 2017-07-13 NOTE — ED Notes (Signed)
Patient is still guarded at this time. Patient denies SI/HI/AVH at this time. Plan of care discussed and encouragement and support provided and safety maintain. Q 15 min safety checks remain in place and video monitoring.

## 2017-07-13 NOTE — ED Notes (Addendum)
Pt mother and pt at nurses station window. Pt mother informs this nurse that pt fell when trying  to get out of bed, but that she is "alright." Pt mother reports that pt did not hit her head when she fell. Pt denies physical pain.Pt denies hitting her head. This fall was unwitnessed by nursing staff. Pt ambulatory without difficulty. No s/s of distress noted. No s/s of injury noted.Full ROM , grip strengths strong. VSS. Jacki ConesLaurie NP notified, no new orders given. Will continue to monitor.

## 2017-07-13 NOTE — ED Notes (Signed)
[facility] of specialized care. Pt left in no acute distress. [facility]. Pt left in no acute distress.

## 2017-07-13 NOTE — ED Notes (Signed)
Visitor at bedside.

## 2017-07-14 ENCOUNTER — Encounter (HOSPITAL_COMMUNITY): Payer: Self-pay | Admitting: *Deleted

## 2017-07-14 ENCOUNTER — Inpatient Hospital Stay (HOSPITAL_COMMUNITY)
Admission: AD | Admit: 2017-07-14 | Discharge: 2017-07-22 | DRG: 885 | Disposition: A | Discharge status: Home / Self Care | Payer: Medicaid Other | Source: Intra-hospital | Attending: Psychiatry | Admitting: Psychiatry

## 2017-07-14 ENCOUNTER — Other Ambulatory Visit: Payer: Self-pay

## 2017-07-14 DIAGNOSIS — F419 Anxiety disorder, unspecified: Secondary | ICD-10-CM

## 2017-07-14 DIAGNOSIS — F22 Delusional disorders: Secondary | ICD-10-CM | POA: Diagnosis not present

## 2017-07-14 DIAGNOSIS — G4700 Insomnia, unspecified: Secondary | ICD-10-CM | POA: Diagnosis present

## 2017-07-14 DIAGNOSIS — F250 Schizoaffective disorder, bipolar type: Principal | ICD-10-CM | POA: Diagnosis present

## 2017-07-14 DIAGNOSIS — F200 Paranoid schizophrenia: Secondary | ICD-10-CM | POA: Insufficient documentation

## 2017-07-14 DIAGNOSIS — R443 Hallucinations, unspecified: Secondary | ICD-10-CM | POA: Diagnosis not present

## 2017-07-14 DIAGNOSIS — Z8249 Family history of ischemic heart disease and other diseases of the circulatory system: Secondary | ICD-10-CM

## 2017-07-14 DIAGNOSIS — Z807 Family history of other malignant neoplasms of lymphoid, hematopoietic and related tissues: Secondary | ICD-10-CM

## 2017-07-14 MED ORDER — LORAZEPAM 1 MG PO TABS: 2.0000 mg | ORAL_TABLET | Freq: Once | ORAL | Status: AC

## 2017-07-14 MED ORDER — PALIPERIDONE ER 6 MG PO TB24
6.0000 mg | ORAL_TABLET | Freq: Every day | ORAL | Status: DC
  Administered 2017-07-17 – 2017-07-22 (×5): 6 mg via ORAL
  Filled 2017-07-14 (×10): qty 1

## 2017-07-14 MED ORDER — ALUM & MAG HYDROXIDE-SIMETH 200-200-20 MG/5ML PO SUSP: 30.0000 mL | ORAL | Status: DC | PRN

## 2017-07-14 MED ORDER — ACETAMINOPHEN 325 MG PO TABS: 650.0000 mg | ORAL_TABLET | Freq: Four times a day (QID) | ORAL | Status: DC | PRN

## 2017-07-14 MED ORDER — BENZTROPINE MESYLATE 0.5 MG PO TABS
0.5000 mg | ORAL_TABLET | Freq: Every day | ORAL | Status: DC
Start: 2017-07-14 — End: 2017-07-14
  Administered 2017-07-14: 0.5 mg via ORAL
  Filled 2017-07-14 (×2): qty 1

## 2017-07-14 MED ORDER — BENZTROPINE MESYLATE 0.5 MG PO TABS: 0.5000 mg | ORAL_TABLET | Freq: Two times a day (BID) | ORAL | Status: DC | PRN

## 2017-07-14 MED ORDER — HALOPERIDOL 5 MG PO TABS
5.0000 mg | ORAL_TABLET | Freq: Two times a day (BID) | ORAL | Status: DC
  Administered 2017-07-14: 5 mg via ORAL
  Filled 2017-07-14 (×3): qty 1

## 2017-07-14 MED ORDER — HYDROXYZINE HCL 25 MG PO TABS
25.0000 mg | ORAL_TABLET | Freq: Three times a day (TID) | ORAL | Status: DC | PRN
  Filled 2017-07-14: qty 1

## 2017-07-14 MED ORDER — LORAZEPAM 2 MG/ML IJ SOLN
2.0000 mg | Freq: Once | INTRAMUSCULAR | Status: AC
  Administered 2017-07-14: 2 mg via INTRAMUSCULAR

## 2017-07-14 MED ORDER — TRAZODONE HCL 50 MG PO TABS
50.0000 mg | ORAL_TABLET | Freq: Every evening | ORAL | Status: DC | PRN
  Filled 2017-07-14: qty 1

## 2017-07-14 MED ORDER — LORAZEPAM 2 MG/ML IJ SOLN
INTRAMUSCULAR | Status: AC
  Administered 2017-07-14: 2 mg via INTRAMUSCULAR
  Filled 2017-07-14: qty 1

## 2017-07-14 MED ORDER — MAGNESIUM HYDROXIDE 400 MG/5ML PO SUSP: 30.0000 mL | Freq: Every day | ORAL | Status: DC | PRN

## 2017-07-14 MED ORDER — LORAZEPAM 1 MG PO TABS
ORAL_TABLET | ORAL | Status: AC
Start: 2017-07-14 — End: 2017-07-15
  Filled 2017-07-14: qty 2

## 2017-07-14 NOTE — Progress Notes (Signed)
Patient ID: Monique LeydenPansy P Koegel, female   DOB: 06/08/1986, 31 y.o.   MRN: [id number]  Pt walked back to her room voluntarily. Pt attempted to approach dayroom one more time but was verbally redirected to her room. Per Alcario Droughtanika, NP will monitor until 0000 for further agitation. Will continue to monitor.

## 2017-07-14 NOTE — [facility] Group Notes (Signed)
LCSW Group Therapy Note   07/14/2017 1:15pm   Type of Therapy and Topic:  Group Therapy:  Overcoming Obstacles   Participation Level:  Did Not Attend   Description of Group:    In this group patients will be encouraged to explore what they see as obstacles to their own wellness and recovery. They will be guided to discuss their thoughts, feelings, and behaviors related to these obstacles. The group will process together ways to cope with barriers, with attention given to specific choices patients can make. Each patient will be challenged to identify changes they are motivated to make in order to overcome their obstacles. This group will be process-oriented, with patients participating in exploration of their own experiences as well as giving and receiving support and challenge from other group members.   Therapeutic Goals: 1. Patient will identify personal and current obstacles as they relate to admission. 2. Patient will identify barriers that currently interfere with their wellness or overcoming obstacles.  3. Patient will identify feelings, thought process and behaviors related to these barriers. 4. Patient will identify two changes they are willing to make to overcome these obstacles:      Summary of Patient Progress      Therapeutic Modalities:   Cognitive Behavioral Therapy Solution Focused Therapy Motivational Interviewing Relapse Prevention Therapy  Ida RogueRodney B Arneisha Kincannon, LCSW 07/14/2017 11:59 AM

## 2017-07-14 NOTE — Progress Notes (Signed)
Patient ID: Monique LeydenPansy P [name], female   DOB: 08/24/1986, 31 y.o.   MRN: [id number]  Report [facility] from admitting [facility], [facility]. MHT gave patient a snack and fluids. Pt given the opportunity to express concerns and ask questions. Pt reports that she wants to speak with her mother but that she is also upset with her mother. She also endorses anxiety about paying for hospital stay. Pt is unsure if she currently has insurance. Pt is a high fall risk due to recent fall (during ED admission). Pt stable at this time, will continue to monitor.

## 2017-07-14 NOTE — H&P (Signed)
Psychiatric Admission Assessment Adult  Patient Identification: [name] MRN:  [id number] Date of Evaluation:  07/14/2017 Chief Complaint:  SCHIZOAFFECTIVE DISORDER; BIPOLAR TYPE  Principal Diagnosis: Schizoaffective disorder, bipolar type ([place]) Diagnosis:   Patient Active Problem List   Diagnosis Date Noted  . Schizophrenia, paranoid type ([place]) [F20.0] 07/14/2017  . Somatic dysfunction of pelvic region [M99.05] 10/26/2014  . Paranoia ([place]) [F22]   . Schizoaffective disorder, bipolar type ([place]) [F25.0] 03/11/2012   History of Present Illness:   [name] is a 31 y/o F with history of schizoaffective disorder who was admitted with worsening symptoms of psychosis. Pt was brought in by police initially to [facility] where she presented with paranoia, disorganization, and recent history of poor medication adherence as an outpatient for several months. Upon interview, pt presents as generally disorganized, tangential, and bizarre. She is a poor historian overall, having difficulty providing a narrative of the events prior to hospitalization.  When asked why she came to the hospital, pt responds, "Sometimes my mom has a tendency to make me feel like she's angry." Pt was asked why her mother would be concerned enough that patient needed to go to the hospital, and pt replied tangentially, stated, "I don't know, there were people under the stairs." Pt appears to respond to internal stimuli throughout the interview and she replies to unseen individuals in the room under her breath during the interview. She endorses AH of hearing the voice of her father whom has passed away. Later in the interview she states that she talks "to God" often. She endorses VH but declines to describe them. She denies SI and HI. She denies symptoms of depression, OCD, and PTSD. She reports that she has been sleeping well and her appetite is good. She denies all recent illicit substance use.  Discussed with patient about treatment  options. She reports that she takes no medications outside of the hospital and instead uses the "[place]" which is "from [place]" and involves using different types of "healing waters." Discussed with patient that we would recommend FDA approved treatments for her presenting symptoms, but pt refused all medications at time of interview. She did detail previous trials of seroquel, haldol, lithium, risperdal, abilify, and invega. Pt was unable to detail if any of these medications were helpful, but she did not recall any side effects from [place]. We discussed starting a scheduled dose of Invega, but pt continued to perseverate on taking her home regimen of healing waters, and she abruptly left the interview. Attempted to discuss with patient that we would need to address her presenting symptoms with medications, and pt did not reply. She had no further questions, comments, or concerns.  Associated Signs/Symptoms: Depression Symptoms:  anxiety, (Hypo) Manic Symptoms:  Delusions, Hallucinations, Anxiety Symptoms:  Excessive Worry, Psychotic Symptoms:  Delusions, Hallucinations: Auditory Visual Paranoia, PTSD Symptoms: NA Total Time spent with patient: 1 hour  Past Psychiatric History:  - previous dx of schizoaffective disorder bipolar type - Pt estimates more than 5 admissions in the past, but cannot recall when last occurred - She has no current outpatient provider but she has worked with [facility] in the past - She denies history of suicide attempt  Is the patient at risk to self? Yes.    Has the patient been a risk to self in the past 6 months? Yes.    Has the patient been a risk to self within the distant past? Yes.    Is the patient a risk to others? Yes.  Has the patient been a risk to others in the past 6 months? Yes.    Has the patient been a risk to others within the distant past? Yes.     Prior Inpatient Therapy:   Prior Outpatient Therapy:    Alcohol Screening: 1. How  often do you have a drink containing alcohol?: Never 2. How many drinks containing alcohol do you have on a typical day when you are drinking?: 1 or 2 3. How often do you have six or more drinks on one occasion?: Never AUDIT-C Score: 0 4. How often during the last year have you found that you were not able to stop drinking once you had started?: Never 5. How often during the last year have you failed to do what was normally expected from you becasue of drinking?: Never 6. How often during the last year have you needed a first drink in the morning to get yourself going after a heavy drinking session?: Never 7. How often during the last year have you had a feeling of guilt of remorse after drinking?: Never 8. How often during the last year have you been unable to remember what happened the night before because you had been drinking?: Never 9. Have you or someone else been injured as a result of your drinking?: No 10. Has a relative or friend or a doctor or another health worker been concerned about your drinking or suggested you cut down?: No Alcohol Use Disorder Identification Test Final Score (AUDIT): 0 Substance Abuse History in the last 12 months:  No. Consequences of Substance Abuse: NA Previous Psychotropic Medications: Yes  Psychological Evaluations: Yes  Past Medical History:  Past Medical History:  Diagnosis Date  . Bipolar 1 disorder ([place]) 2011  . Paranoia ([place])   . Schizoaffective disorder ([place])    History reviewed. No pertinent surgical history. Family History:  Family History  Problem Relation Age of Onset  . Cancer Father        lymphoma  . Hypertension Mother   . Diabetes Neg Hx   . Heart disease Neg Hx    Family Psychiatric  History:  - Pt denies family psychiatric history Tobacco Screening: Have you used any form of tobacco in the last 30 days? (Cigarettes, Smokeless Tobacco, Cigars, and/or Pipes): No Social History:  Social History   Substance and Sexual  Activity  Alcohol Use No   Comment: Pt denies      Social History   Substance and Sexual Activity  Drug Use No   Comment: Pt denies    Additional Social History: Are you sexually active?: No What is your sexual orientation?: Heterosexual Has your sexual activity been affected by drugs, alcohol, medication, or emotional stress?: No Does patient have children?: No                         Allergies:   Allergies  Allergen Reactions  . Quetiapine Other (See Comments)    Facial skin irritation  . Yes For Women [Intimacy Products] Rash   Lab Results:  Results for orders placed or performed during the hospital encounter of 07/12/17 (from the past 48 hour(s))  Comprehensive metabolic panel     Status: Abnormal   Collection Time: 07/12/17  4:00 PM  Result Value Ref Range   Sodium 138 135 - 145 mmol/L   Potassium 4.0 3.5 - 5.1 mmol/L   Chloride 105 101 - 111 mmol/L   CO2 25 22 - 32 mmol/L  Glucose, Bld 117 (H) 65 - 99 mg/dL   BUN 10 6 - 20 mg/dL   Creatinine, Ser 0.75 0.44 - 1.00 mg/dL   Calcium 9.3 8.9 - 10.3 mg/dL   Total Protein 7.1 6.5 - 8.1 g/dL   Albumin 4.1 3.5 - 5.0 g/dL   AST 20 15 - 41 U/L   ALT 12 (L) 14 - 54 U/L   Alkaline Phosphatase 41 38 - 126 U/L   Total Bilirubin 0.3 0.3 - 1.2 mg/dL   GFR calc non Af Amer >60 >60 mL/min   GFR calc Af Amer >60 >60 mL/min    Comment: (NOTE) The eGFR has been calculated using the CKD EPI equation. This calculation has not been validated in all clinical situations. eGFR's persistently <60 mL/min signify possible Chronic Kidney Disease.    Anion gap 8 5 - 15  cbc     Status: None   Collection Time: 07/12/17  4:00 PM  Result Value Ref Range   WBC 5.9 4.0 - 10.5 K/uL   RBC 4.49 3.87 - 5.11 MIL/uL   Hemoglobin 12.4 12.0 - 15.0 g/dL   HCT 36.1 36.0 - 46.0 %   MCV 80.4 78.0 - 100.0 fL   MCH 27.6 26.0 - 34.0 pg   MCHC 34.3 30.0 - 36.0 g/dL   RDW 13.8 11.5 - 15.5 %   Platelets 284 150 - 400 K/uL  I-Stat Beta hCG  blood, ED ([facility], [facility], [facility] only)     Status: None   Collection Time: 07/12/17  4:16 PM  Result Value Ref Range   I-stat hCG, quantitative <5.0 <5 mIU/mL   Comment 3            Comment:   GEST. AGE      CONC.  (mIU/mL)   <=1 WEEK        5 - 50     2 WEEKS       50 - 500     3 WEEKS       100 - 10,000     4 WEEKS     1,000 - 30,000        FEMALE AND NON-PREGNANT FEMALE:     LESS THAN 5 mIU/mL   Rapid urine drug screen (hospital performed)     Status: None   Collection Time: 07/12/17  4:30 PM  Result Value Ref Range   Opiates NONE DETECTED NONE DETECTED   Cocaine NONE DETECTED NONE DETECTED   Benzodiazepines NONE DETECTED NONE DETECTED   Amphetamines NONE DETECTED NONE DETECTED   Tetrahydrocannabinol NONE DETECTED NONE DETECTED   Barbiturates NONE DETECTED NONE DETECTED    Comment:        DRUG SCREEN FOR MEDICAL PURPOSES ONLY.  IF CONFIRMATION IS NEEDED FOR ANY PURPOSE, NOTIFY LAB WITHIN 5 DAYS.        LOWEST DETECTABLE LIMITS FOR URINE DRUG SCREEN Drug Class       Cutoff (ng/mL) Amphetamine      1000 Barbiturate      200 Benzodiazepine   401 Tricyclics       027 Opiates          300 Cocaine          300 THC              50     Blood Alcohol level:  Lab Results  Component Value Date   [facility] <5 01/27/2015   ETH <11 25/36/6440    Metabolic Disorder Labs:  Lab  Results  Component Value Date   HGBA1C 5.9 07/27/2014   No results found for: PROLACTIN Lab Results  Component Value Date   CHOL 179 07/27/2014   TRIG 49 07/27/2014   HDL 44 07/27/2014   CHOLHDL 5 06/21/2014   VLDL 10 07/27/2014   LDLCALC 125 (H) 07/27/2014   LDLCALC 100 (H) 06/21/2014    Current Medications: Current Facility-Administered Medications  Medication Dose Route Frequency Provider Last Rate Last Dose  . acetaminophen (TYLENOL) tablet 650 mg  650 mg Oral Q6H PRN Ethelene Hal, NP      . alum & mag hydroxide-simeth (MAALOX/MYLANTA) 200-200-20 MG/5ML suspension 30 mL  30 mL Oral Q4H PRN  Ethelene Hal, NP      . benztropine (COGENTIN) tablet 0.5 mg  0.5 mg Oral BID PRN [name], MD      . hydrOXYzine (ATARAX/VISTARIL) tablet 25 mg  25 mg Oral TID PRN Ethelene Hal, NP      . magnesium hydroxide (MILK OF MAGNESIA) suspension 30 mL  30 mL Oral Daily PRN Ethelene Hal, NP      . paliperidone (INVEGA) 24 hr tablet 6 mg  6 mg Oral Daily [name], MD      . traZODone (DESYREL) tablet 50 mg  50 mg Oral QHS PRN Ethelene Hal, NP       PTA Medications: Medications Prior to Admission  Medication Sig Dispense Refill Last Dose  . benztropine (COGENTIN) 0.5 MG tablet Take 1 tablet (0.5 mg total) by mouth 2 (two) times daily as needed for tremors. (Patient not taking: Reported on 07/12/2017) 6 tablet 0 Not Taking at Unknown time  . Coconut Oil 1000 MG CAPS Take 2 capsules by mouth 2 (two) times daily.   Not Taking at Unknown time  . haloperidol (HALDOL) 10 MG tablet Take 1 tablet (10 mg total) by mouth at bedtime. (Patient not taking: Reported on 07/12/2017) 5 tablet 0 Not Taking at Unknown time  . hydrocortisone (ANUSOL-HC) 2.5 % rectal cream Apply rectally 2 times daily (Patient not taking: Reported on 07/12/2017) 28.35 g 1 Not Taking at Unknown time  . Multiple Vitamins-Minerals (MULTIVITAMIN WITH MINERALS) tablet Take 1 tablet daily by mouth.   Past Week at Unknown time  . trimethoprim-polymyxin b (POLYTRIM) ophthalmic solution Place 1 drop into the right eye 4 (four) times daily. For 5 days (Patient not taking: Reported on 05/21/2016) 10 mL 0 Not Taking at Unknown time    Musculoskeletal: Strength & Muscle Tone: within normal limits Gait & Station: normal Patient leans: N/A  Psychiatric Specialty Exam: Physical Exam  Nursing note and vitals reviewed.   Review of Systems  Constitutional: Negative for chills and fever.  Respiratory: Negative for cough and shortness of breath.   Cardiovascular: Negative for chest pain.   Gastrointestinal: Negative for heartburn, nausea and vomiting.  Psychiatric/Behavioral: Positive for hallucinations. Negative for depression.    Blood pressure 115/88, pulse 90, temperature 98.1 F (36.7 C), temperature source Oral, resp. rate 16, height 5' 3"  (1.6 m), weight 64.4 kg (142 lb).Body mass index is 25.15 kg/m.  General Appearance: Bizarre, Casual and Disheveled  Eye Contact:  Good  Speech:  Garbled and Pressured  Volume:  Normal  Mood:  Anxious and Irritable  Affect:  Appropriate, Flat and Labile  Thought Process:  Disorganized and Descriptions of Associations: Loose  Orientation:  Full (Time, Place, and Person)  Thought Content:  Illogical, Delusions, Hallucinations: Auditory Visual, Ideas of Reference:  Paranoia Delusions and Paranoid Ideation  Suicidal Thoughts:  No  Homicidal Thoughts:  No  Memory:  Immediate;   Fair Recent;   Fair Remote;   Fair  Judgement:  Poor  Insight:  Lacking  Psychomotor Activity:  Normal  Concentration:  Concentration: Fair  Recall:  [place] of Knowledge:  Poor  Language:  Fair  Akathisia:  No  Handed:    AIMS (if indicated):     Assets:  Catering manager Housing Physical Health Resilience Social Support  ADL's:  Intact  Cognition:  WNL  Sleep:  Number of Hours: 1    Treatment Plan Summary: Daily contact with patient to assess and evaluate symptoms and progress in treatment and Medication management  Observation Level/Precautions:  15 minute checks  Laboratory:  CBC HbAIC HCG UDS  Psychotherapy:  Encourage participation in groups and therapeutic milieu  Medications:  Start Invega 32m po qDay  Consultations:    Discharge Concerns:    Estimated LOS: 3-5 days  Other:     Physician Treatment Plan for Primary Diagnosis: Schizoaffective disorder, bipolar type ([place] Long Term Goal(s): Improvement in symptoms so as ready for discharge  Short Term Goals: Ability to identify and develop effective coping  behaviors will improve  Physician Treatment Plan for Secondary Diagnosis: Principal Problem:   Schizoaffective disorder, bipolar type ([place]  Long Term Goal(s): Improvement in symptoms so as ready for discharge  Short Term Goals: Compliance with prescribed medications will improve  I certify that inpatient services furnished can reasonably be expected to improve the patient's condition.    CPennelope Bracken MD 11/20/20182:53 PM

## 2017-07-14 NOTE — [facility] Suicide Risk Assessment (Signed)
[facility]   Nursing information obtained from:    Demographic factors:    Current Mental Status:    Loss Factors:    Historical Factors:    Risk Reduction Factors:     Total Time spent with patient: 1 hour Principal Problem: Schizoaffective disorder, bipolar type ([facility]) Diagnosis:   Patient Active Problem List   Diagnosis Date Noted  . Schizophrenia, paranoid type ([facility]) [F20.0] 07/14/2017  . Somatic dysfunction of pelvic region [M99.05] 10/26/2014  . Paranoia ([facility]) [F22]   . Schizoaffective disorder, bipolar type ([facility]) [F25.0] 03/11/2012   Subjective Data:  See H&P for full HPI -[name] is a 31 y/o F with history of schizoaffective disorder who was admitted on IVC with worsening symptoms of paranoia and disorganized psychosis in the context of medication non-adherence. Pt endorses AH, VH, and paranoia. She denies illicit substance use. She has poor insight and refuses medications aside from her home regimen which uses "healing waters." Pt will be restarted on trial of Invega with plan to possibly transition to long-acting injectable form.  Continued Clinical Symptoms:  Alcohol Use Disorder Identification Test Final Score (AUDIT): 0 The "Alcohol Use Disorders Identification Test", Guidelines for Use in Primary Care, Second Edition.  World Science writerHealth Organization [facility](WHO). Score between 0-7:  no or low risk or alcohol related problems. Score between 8-15:  moderate risk of alcohol related problems. Score between 16-19:  high risk of alcohol related problems. Score 20 or above:  warrants further diagnostic evaluation for alcohol dependence and treatment.   CLINICAL FACTORS:   Schizophrenia:   Paranoid or undifferentiated type   Musculoskeletal: Strength & Muscle Tone: within normal limits Gait & Station: normal Patient leans: N/A  Psychiatric Specialty Exam: Physical Exam  Nursing note and vitals reviewed.   Review of Systems  Constitutional:  Negative for chills and fever.  Respiratory: Negative for cough and shortness of breath.   Cardiovascular: Negative for chest pain.  Gastrointestinal: Negative for abdominal pain, heartburn, nausea and vomiting.  Psychiatric/Behavioral: Positive for hallucinations. Negative for depression and suicidal ideas.    Blood pressure 115/88, pulse 90, temperature 98.1 F (36.7 C), temperature source Oral, resp. rate 16, height 5\' 3"  (1.6 m), weight 64.4 kg (142 lb).Body mass index is 25.15 kg/m.  General Appearance: Casual  Eye Contact:  Good  Speech:  Garbled and Pressured  Volume:  Normal  Mood:  Anxious and Irritable  Affect:  Appropriate, Flat and Labile  Thought Process:  Disorganized and Descriptions of Associations: Loose  Orientation:  Full (Time, Place, and Person)  Thought Content:  Illogical, Delusions, Hallucinations: Auditory Visual, Ideas of Reference:   Paranoia Delusions and Paranoid Ideation  Suicidal Thoughts:  No  Homicidal Thoughts:  No  Memory:  Immediate;   Fair Recent;   Fair Remote;   Fair  Judgement:  Poor  Insight:  Lacking  Psychomotor Activity:  Normal  Concentration:  Concentration: Fair  Recall:  Fis[employer]Fair  Fund of Knowledge:  Poor  Language:  Fair  Akathisia:  No  Handed:    AIMS (if indicated):     Assets:  Health and safety inspectorinancial Resources/Insurance Housing Physical Health Resilience Social Support  ADL's:  Intact  Cognition:  WNL  Sleep:  Number of Hours: 1         COGNITIVE FEATURES THAT CONTRIBUTE TO RISK:  Closed-mindedness and Thought constriction (tunnel vision)    SUICIDE RISK:   Minimal: No identifiable suicidal ideation.  Patients presenting with no risk factors but with morbid  ruminations; may be classified as minimal risk based on the severity of the depressive symptoms  PLAN OF CARE:  - Admit to inpatient level of care  - Labs: HgbA1c, lipid panel, prolactin, TSH  - Schizoaffective disorder, bipolar type  - Start Invega 6mg  qDay  -  Discontinue haldol (started in ED) - EPS  - Cogentin 0.5mg  BID prn EPS - Anxiety  - Start atarax 25mg  TID prn anxiety - Insomnia  - start trazodone 50mg  qhs prn insomnia  -Encourage participation in groups and therapeutic milieu -Discharge planning will be ongoing    I certify that inpatient services furnished can reasonably be expected to improve the patient's condition.   Micheal Likenshristopher T Chastin Garlitz, MD 07/14/2017, 3:12 PM
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
## 2017-07-14 NOTE — Progress Notes (Signed)
DAR NOTE: Patient presents with anxious affect and mood.  Patient observed responding to internal stimuli.  Poor eye contact during assessment.  Appearance is unkempt and malodorous.  Appears guarded, disorganized and delusional.  Patient refused all prescribed medications after several encouragements.  Patient states I'm not the doctor.  She will only take her medication from the doctor."  Rates depression at 0, hopelessness at 0, and anxiety at 0.  Maintained on routine safety checks.  Support and encouragement offered as needed.  States goal for today is "worry."  Patient is withdrawn and isolates to her room.  Patient is safe on the unit.

## 2017-07-14 NOTE — Progress Notes (Signed)
Recreation Therapy Notes  INPATIENT RECREATION THERAPY ASSESSMENT  Patient Details Name: [name] MRN: [id number] DOB: 08/08/1986 Today's Date: 07/14/2017  Patient Stressors: Family  Pt stated she was here because of her mother. Pt stated her mother and siblings are a stressor.  Coping Skills:   Isolate, Arguments, Avoidance, Exercise, Art/Dance, Music  Personal Challenges: Concentration, Self-Esteem/Confidence, Social Interaction  Leisure Interests (2+):  Games - Other (Comment), Community - Other (Comment)(billards; arcades)  Awareness of Community Resources:  No  Patient Strengths:  Decision-making; making people talk  Patient Identified Areas of Improvement:  Nursing; speech provider  Current Recreation Participation:  "Not very often"  Patient Goal for Hospitalization:  "Get back to normal life"  [place]  [place]idence[place]  Current [place] (including self-harm):  No  Current HI:  No  Consent to Intern Participation: N/A   Caroll RancherMarjette Lanier Felty, LRT/CTRS  Caroll RancherLindsay, Lilyanah Celestin A 07/14/2017, 2:40 PM

## 2017-07-14 NOTE — Tx Team (Signed)
Initial Treatment Plan 07/14/2017 12:33 AM Erial Connye [name] Donica ZOX:[id number]    PATIENT STRESSORS: Medication change or noncompliance   PATIENT STRENGTHS: Ability for insight Average or above average intelligence Communication skills General fund of knowledge Supportive family/friends   PATIENT IDENTIFIED PROBLEMS: Psychosis "I don't want no medication" "I'm on a new system and it works just fine"                     DISCHARGE CRITERIA:  Ability to meet basic life and health needs Improved stabilization in mood, thinking, and/or behavior Verbal commitment to aftercare and medication compliance  PRELIMINARY DISCHARGE PLAN: Attend aftercare/continuing care group Return to previous living arrangement  PATIENT/FAMILY INVOLVEMENT: This treatment plan has been presented to and reviewed with the patient, Monique Connye BurkittP [name], and/or family member, .  The patient and family have been given the opportunity to ask questions and make suggestions.  [name], [name], [place] 07/14/2017, 12:33 AM

## 2017-07-14 NOTE — Progress Notes (Signed)
Patient ID: Collene LeydenPansy P Ayllon, female   DOB: 12/07/1985, 31 y.o.   MRN: [id number]  Pt pacing the hallway tonight. Pt agitated by the sound of the heat coming on on the unit. Pt gets up and walks towards the unit door. Pt attempts to get off of the unit numerous times when staff is passing through the doorway. Writer attempts to verbally redirect patient numerous times, changing the television station, walking her back to the dayroom, playing music videos. Pt given clear verbal boundaries to stay away from the unit door. Pt unable to follow commands. Provider, Alcario Droughtanika, NP notified, see MAR. Pt offered PO medications by two different staff members. Pt refused. Pt yelling at Clinical research associatewriter, cursing. Pt placed in a manual hold, 2 mg Ativan IM administered. Pt continues to curse at staff.  Pt consents show patient does not want any family notified of event. Pt currently remains in the dayroom with MHT and security. Will continue to monitor.

## 2017-07-14 NOTE — Treatment Plan (Signed)
Interdisciplinary Treatment and Diagnostic Plan Update  07/14/2017 Time of Session: 11:01 AM  [name] MRN: [id number]  Principal Diagnosis: Schizophrenia, paranoid type   Secondary Diagnoses: Active Problems:   Schizophrenia, paranoid type (HCC)   Current Medications:  Current Facility-Administered Medications  Medication Dose Route Frequency Provider Last Rate Last Dose  . acetaminophen (TYLENOL) tablet 650 mg  650 mg Oral Q6H PRN Laveda AbbeParks, Laurie Britton, NP      . alum & mag hydroxide-simeth (MAALOX/MYLANTA) 200-200-20 MG/5ML suspension 30 mL  30 mL Oral Q4H PRN Laveda AbbeParks, Laurie Britton, NP      . benztropine (COGENTIN) tablet 0.5 mg  0.5 mg Oral Daily Laveda AbbeParks, Laurie Britton, NP   0.5 mg at 07/14/17 [id number]  . haloperidol (HALDOL) tablet 5 mg  5 mg Oral BID Laveda AbbeParks, Laurie Britton, NP   5 mg at 07/14/17 [id number]  . hydrOXYzine (ATARAX/VISTARIL) tablet 25 mg  25 mg Oral TID PRN Laveda AbbeParks, Laurie Britton, NP      . magnesium hydroxide (MILK OF MAGNESIA) suspension 30 mL  30 mL Oral Daily PRN Laveda AbbeParks, Laurie Britton, NP      . traZODone (DESYREL) tablet 50 mg  50 mg Oral QHS PRN Laveda AbbeParks, Laurie Britton, NP        PTA Medications: Medications Prior to Admission  Medication Sig Dispense Refill Last Dose  . benztropine (COGENTIN) 0.5 MG tablet Take 1 tablet (0.5 mg total) by mouth 2 (two) times daily as needed for tremors. (Patient not taking: Reported on 07/12/2017) 6 tablet 0 Not Taking at Unknown time  . Coconut Oil 1000 MG CAPS Take 2 capsules by mouth 2 (two) times daily.   Not Taking at Unknown time  . haloperidol (HALDOL) 10 MG tablet Take 1 tablet (10 mg total) by mouth at bedtime. (Patient not taking: Reported on 07/12/2017) 5 tablet 0 Not Taking at Unknown time  . hydrocortisone (ANUSOL-HC) 2.5 % rectal cream Apply rectally 2 times daily (Patient not taking: Reported on 07/12/2017) 28.35 g 1 Not Taking at Unknown time  . Multiple Vitamins-Minerals (MULTIVITAMIN WITH MINERALS) tablet Take 1 tablet  daily by mouth.   Past Week at Unknown time  . trimethoprim-polymyxin b (POLYTRIM) ophthalmic solution Place 1 drop into the right eye 4 (four) times daily. For 5 days (Patient not taking: Reported on 05/21/2016) 10 mL 0 Not Taking at Unknown time    Patient Stressors: Medication change or noncompliance  Patient Strengths: Ability for insight Average or above average intelligence Communication skills General fund of knowledge Supportive family/friends  Treatment Modalities: Medication Management, Group therapy, Case management,  1 to 1 session with clinician, Psychoeducation, Recreational therapy.   Physician Treatment Plan for Primary Diagnosis: Schizophrenia, paranoid type  Long Term Goal(s): Improvement in symptoms so as ready for discharge  Short Term Goals:    Medication Management: Evaluate patient's response, side effects, and tolerance of medication regimen.  Therapeutic Interventions: 1 to 1 sessions, Unit Group sessions and Medication administration.  Evaluation of Outcomes: Progressing  Physician Treatment Plan for Secondary Diagnosis: Active Problems:   Schizophrenia, paranoid type (HCC)   Long Term Goal(s): Improvement in symptoms so as ready for discharge  Short Term Goals:    Medication Management: Evaluate patient's response, side effects, and tolerance of medication regimen.  Therapeutic Interventions: 1 to 1 sessions, Unit Group sessions and Medication administration.  Evaluation of Outcomes: Progressing   RN Treatment Plan for Primary Diagnosis: Schizophrenia, paranoid type Long Term Goal(s): Knowledge of disease and therapeutic regimen to maintain health  will improve  Short Term Goals: Ability to participate in decision making will improve and Compliance with prescribed medications will improve  Medication Management: RN will administer medications as ordered by provider, will assess and evaluate patient's response and provide education to patient for  prescribed medication. RN will report any adverse and/or side effects to prescribing provider.  Therapeutic Interventions: 1 on 1 counseling sessions, Psychoeducation, Medication administration, Evaluate responses to treatment, Monitor vital signs and CBGs as ordered, Perform/monitor CIWA, COWS, AIMS and Fall Risk screenings as ordered, Perform wound care treatments as ordered.  Evaluation of Outcomes: Progressing   LCSW Treatment Plan for Primary Diagnosis:Schizophrenia, paranoid type  Long Term Goal(s): Safe transition to appropriate next level of care at discharge, Engage patient in therapeutic group addressing interpersonal concerns.  Short Term Goals: Engage patient in aftercare planning with referrals and resources and Facilitate acceptance of mental health diagnosis and concerns  Therapeutic Interventions: Assess for all discharge needs, 1 to 1 time with Social worker, Explore available resources and support systems, Assess for adequacy in community support network, Educate family and significant other(s) on suicide prevention, Complete Psychosocial Assessment, Interpersonal group therapy.  Evaluation of Outcomes: Progressing   Progress in Treatment: Attending groups: Yes Participating in groups: Yes Taking medication as prescribed: Yes Toleration medication: Yes, no side effects reported at this time Family/Significant other contact made: Yes Patient understands diagnosis: No, limited insight Discussing patient identified problems/goals with staff: Yes Medical problems stabilized or resolved: Yes Denies suicidal/homicidal ideation: Yes Issues/concerns per patient self-inventory: None Other: N/A  New problem(s) identified: None identified at this time.   New Short Term/Long Term Goal(s): "I'm here because of what my mom says about me".  Discharge Plan or Barriers: Her current plan is to return back to her home with her mother and attend outpatient therapy and see a  psychiatrist.  Reason for Continuation of Hospitalization:  Delusions  Medication stabilization   Estimated Length of Stay: 07/19/2017  Attendees: Patient:[name] 07/14/2017  11:01 AM Physician:Christopher Francisco Capuchin[place]eville, MD         07/14/2017  11:01 AM Nursing:Jan Delford FieldWright, RN   07/14/2017  11:01 AM RN Care Manager:  Onnie BoerJennifer  Clark, RN    07/14/2017  11:01 AM Social Worker: Johny Shearsassandra Taite Baldassari LCSWA   07/14/2017  11:01 AM Recreational Therapist:        07/14/2017  11:01 AM Other:      07/14/2017  11:01 AM Other:   07/14/2017  11:01 AM   Scribe for Treatment Team: Johny Shearsassandra Dhani Imel LCSWA 07/14/2017 11:01 AM

## 2017-07-14 NOTE — Tx Team (Signed)
nterdisciplinary Treatment and Diagnostic Plan Update  07/14/2017 Time of Session: 4:22 PM  [name] MRN: [id number]  Principal Diagnosis: Schizophrenia, paranoid type   Secondary Diagnoses: Principal Problem:   Schizoaffective disorder, bipolar type (HCC)   Current Medications:  Current Facility-Administered Medications  Medication Dose Route Frequency Provider Last Rate Last Dose  . acetaminophen (TYLENOL) tablet 650 mg  650 mg Oral Q6H PRN Laveda AbbeParks, Laurie Britton, NP      . alum & mag hydroxide-simeth (MAALOX/MYLANTA) 200-200-20 MG/5ML suspension 30 mL  30 mL Oral Q4H PRN Laveda AbbeParks, Laurie Britton, NP      . benztropine (COGENTIN) tablet 0.5 mg  0.5 mg Oral BID PRN Micheal Likensainville, Christopher T, MD      . hydrOXYzine (ATARAX/VISTARIL) tablet 25 mg  25 mg Oral TID PRN Laveda AbbeParks, Laurie Britton, NP      . magnesium hydroxide (MILK OF MAGNESIA) suspension 30 mL  30 mL Oral Daily PRN Laveda AbbeParks, Laurie Britton, NP      . paliperidone (INVEGA) 24 hr tablet 6 mg  6 mg Oral Daily Micheal Likensainville, Christopher T, MD      . traZODone (DESYREL) tablet 50 mg  50 mg Oral QHS PRN Laveda AbbeParks, Laurie Britton, NP        PTA Medications: Medications Prior to Admission  Medication Sig Dispense Refill Last Dose  . benztropine (COGENTIN) 0.5 MG tablet Take 1 tablet (0.5 mg total) by mouth 2 (two) times daily as needed for tremors. (Patient not taking: Reported on 07/12/2017) 6 tablet 0 Not Taking at Unknown time  . Coconut Oil 1000 MG CAPS Take 2 capsules by mouth 2 (two) times daily.   Not Taking at Unknown time  . haloperidol (HALDOL) 10 MG tablet Take 1 tablet (10 mg total) by mouth at bedtime. (Patient not taking: Reported on 07/12/2017) 5 tablet 0 Not Taking at Unknown time  . hydrocortisone (ANUSOL-HC) 2.5 % rectal cream Apply rectally 2 times daily (Patient not taking: Reported on 07/12/2017) 28.35 g 1 Not Taking at Unknown time  . Multiple Vitamins-Minerals (MULTIVITAMIN WITH MINERALS) tablet Take 1 tablet daily by mouth.    Past Week at Unknown time  . trimethoprim-polymyxin b (POLYTRIM) ophthalmic solution Place 1 drop into the right eye 4 (four) times daily. For 5 days (Patient not taking: Reported on 05/21/2016) 10 mL 0 Not Taking at Unknown time    Patient Stressors: Medication change or noncompliance  Patient Strengths: Ability for insight Average or above average intelligence Communication skills General fund of knowledge Supportive family/friends  Treatment Modalities: Medication Management, Group therapy, Case management,  1 to 1 session with clinician, Psychoeducation, Recreational therapy.   Physician Treatment Plan for Primary Diagnosis: Schizophrenia, paranoid type  Long Term Goal(s): Improvement in symptoms so as ready for discharge  Short Term Goals: Ability to identify and develop effective coping behaviors will improve Compliance with prescribed medications will improve  Medication Management: Evaluate patient's response, side effects, and tolerance of medication regimen.  Therapeutic Interventions: 1 to 1 sessions, Unit Group sessions and Medication administration.  Evaluation of Outcomes: Progressing  Physician Treatment Plan for Secondary Diagnosis: Principal Problem:   Schizoaffective disorder, bipolar type (HCC)   Long Term Goal(s): Improvement in symptoms so as ready for discharge  Short Term Goals: Ability to identify and develop effective coping behaviors will improve Compliance with prescribed medications will improve  Medication Management: Evaluate patient's response, side effects, and tolerance of medication regimen.  Therapeutic Interventions: 1 to 1 sessions, Unit Group sessions and Medication administration.  Evaluation of Outcomes: Progressing   RN Treatment Plan for Primary Diagnosis: Schizophrenia, paranoid type Long Term Goal(s): Knowledge of disease and therapeutic regimen to maintain health will improve  Short Term Goals: Ability to participate in  decision making will improve and Compliance with prescribed medications will improve  Medication Management: RN will administer medications as ordered by provider, will assess and evaluate patient's response and provide education to patient for prescribed medication. RN will report any adverse and/or side effects to prescribing provider.  Therapeutic Interventions: 1 on 1 counseling sessions, Psychoeducation, Medication administration, Evaluate responses to treatment, Monitor vital signs and CBGs as ordered, Perform/monitor CIWA, COWS, AIMS and Fall Risk screenings as ordered, Perform wound care treatments as ordered.  Evaluation of Outcomes: Progressing   LCSW Treatment Plan for Primary Diagnosis:Schizophrenia, paranoid type  Long Term Goal(s): Safe transition to appropriate next level of care at discharge, Engage patient in therapeutic group addressing interpersonal concerns.  Short Term Goals: Engage patient in aftercare planning with referrals and resources and Facilitate acceptance of mental health diagnosis and concerns  Therapeutic Interventions: Assess for all discharge needs, 1 to 1 time with Social worker, Explore available resources and support systems, Assess for adequacy in community support network, Educate family and significant other(s) on suicide prevention, Complete Psychosocial Assessment, Interpersonal group therapy.  Evaluation of Outcomes: Progressing   Progress in Treatment: Attending groups: Yes Participating in groups: Yes Taking medication as prescribed: Yes Toleration medication: Yes, no side effects reported at this time Family/Significant other contact made: Yes Patient understands diagnosis: No, limited insight Discussing patient identified problems/goals with staff: Yes Medical problems stabilized or resolved: Yes Denies suicidal/homicidal ideation: Yes Issues/concerns per patient self-inventory: None Other: N/A  New problem(s) identified: None  identified at this time.   New Short Term/Long Term Goal(s): "I'm here because of what my mom says about me".  Discharge Plan or Barriers: Her current plan is to return back to her home with her mother and attend outpatient therapy and see a psychiatrist.  Reason for Continuation of Hospitalization:  Delusions  Medication stabilization   Estimated Length of Stay: 07/19/2017  Attendees: Patient:[name] 07/14/2017  4:22 PM Physician:Christopher Francisco [place], MD         07/14/2017  4:22 PM Nursing:Jan Delford FieldWright, RN   07/14/2017  4:22 PM RN Care Manager:  Onnie BoerJennifer  Clark, RN    07/14/2017  4:22 PM Social Worker: Johny Shearsassandra Bitha Fauteux LCSWA   07/14/2017  4:22 PM Recreational Therapist:        07/14/2017  4:22 PM Other:      07/14/2017  4:22 PM Other:   07/14/2017  4:22 PM   Scribe for Treatment Team: Johny Shearsassandra Dnasia Gauna LCSWA 07/14/2017 4:22 PM

## 2017-07-14 NOTE — [facility] Suicide Risk Assessment (Signed)
[facility] INPATIENT:  Family/Significant Other Suicide Prevention Education  Suicide Prevention Education:  Education Completed; None has been identified by the patient as the family member/significant other with whom the patient will be residing, and identified as the person(s) who will aid the patient in the event of a mental health crisis (suicidal ideations/suicide attempt).  With written consent from the patient, the family member/significant other has been provided the following suicide prevention education, prior to the and/or following the discharge of the patient.  The suicide prevention education provided includes the following:  Suicide risk factors  Suicide prevention and interventions  National Suicide Hotline telephone number  [facility] assessment telephone number  [facility] Emergency Assistance 911  [facility] and/or [facility]idential Mobile Crisis Unit telephone number  Request made of family/significant other to:  Remove weapons (e.g., guns, rifles, knives), all items previously/currently identified as safety concern.    Remove drugs/medications (over-the-counter, prescriptions, illicit drugs), all items previously/currently identified as a safety concern.  The family member/significant other verbalizes understanding of the suicide prevention education information provided.  The family member/significant other agrees to remove the items of safety concern listed above.  Patient did not report any SI. No SPE required.  Johny ShearsCassandra  Rakesh Dutko 07/14/2017, 1:29 PM

## 2017-07-14 NOTE — Progress Notes (Signed)
Pt did not attend wrap-up group   

## 2017-07-14 NOTE — Progress Notes (Addendum)
Patient ID: [name], female   DOB: 05/05/1986, 31 y.o.   MRN: [id number]  Pt currently presents with a flat affect and anxious, pacing behavior. Pt speech is broken and tangential. Pt paces in hallway, talking to herself, at one point carrying her bags to the dayroom and unit door. Asks to speak with [employer] and then Engineer, materialssecurity officer. Pt reports to Clinical research associatewriter ongoing and increased paranoia about her mother and siblings. Pt asks multiple people if she can leave and "get better at home." Pt has poor sleep without taking any current medications.   Pt's labs and vitals were monitored throughout the night. Pt given a 1:1 about emotional and mental status. Pt supported and encouraged to express concerns and questions. Pt given comfort measures like more blankets and juice in order to deescalate during periods of agitation. Engineer, materialsecurity officer ensured pt of safety and encouraged pt to speak with MD in the morning about discharge.  Pt's safety ensured with 15 minute and environmental checks. Pt verbally agrees to contact staff before doing anything to harm herself. Pt is verbally redirectable but states "I get agitated, I do get angry..." Will continue to monitor.

## 2017-07-14 NOTE — Progress Notes (Signed)
[name] is a [age] year old female pt admitted on involuntary basis. On admission, her thought process does appear disorganized and she is dressed in scrubs and is malodorous. She is able to articulate that she has not been taking her medications for awhile and reports that she does not want to take any. She spoke about how she is tired of taking medications all the time and reports that she is doing just fine without them. She denied any auditory hallucinations but appears to be responding internally. She denied any substance abuse issues and reports that she has never done any drugs. She reports that she lives with her mother and reports that she will go back there, however while she is here she does not want her mother to visit. She denies any SI on admission and is able to let us know if she begins to feet that way. Murial was oriented to the unit and safety maintained.

## 2017-07-14 NOTE — Progress Notes (Signed)
Recreation Therapy Notes  Date: 07/14/17 Time: 1000 Location: 500 Hall Dayroom  Group Topic: Coping Skills  Goal Area(s) Addresses:  Patient will be able to identify positive coping skills. Patient will be able to identify benefits of positive coping skills. Patient will be able to identify benefits of using coping skills post d/c.  Intervention: Worksheet, scissors, glue sticks, magazines  Activity: Coping Skills.  Patients were given [name] worksheet that was broken down into 5 categories (diversions, social, cognitive, tension releaser and physical).  Patients were to identify picture from the magazines that represent at least 2 coping skills they would use for each category.   Education: Coping Skills, Discharge Planning.   Education Outcome: Acknowledges understanding/In group clarification offered/Needs additional education.   Clinical Observations/Feedback: Pt did not attend group.   [name], LRT/CTRS         [name] 07/14/2017 12:51 PM 

## 2017-07-15 DIAGNOSIS — R443 Hallucinations, unspecified: Secondary | ICD-10-CM

## 2017-07-15 MED ORDER — HALOPERIDOL 5 MG PO TABS
10.0000 mg | ORAL_TABLET | Freq: Once | ORAL | Status: AC
  Filled 2017-07-15: qty 2

## 2017-07-15 MED ORDER — HALOPERIDOL LACTATE 5 MG/ML IJ SOLN
10.0000 mg | Freq: Once | INTRAMUSCULAR | Status: AC
  Filled 2017-07-15: qty 2

## 2017-07-15 MED ORDER — BENZTROPINE MESYLATE 0.5 MG PO TABS
0.5000 mg | ORAL_TABLET | Freq: Once | ORAL | Status: AC
  Filled 2017-07-15: qty 1

## 2017-07-15 MED ORDER — DIPHENHYDRAMINE HCL 50 MG/ML IJ SOLN
25.0000 mg | Freq: Once | INTRAMUSCULAR | Status: AC
  Filled 2017-07-15: qty 0.5

## 2017-07-15 NOTE — Plan of Care (Addendum)
CARE PLAN DOCUMENTATION. Pt is unable to follow verbal redirection. Pt is non adherent to medication regimen and is preoccupied with discharge.

## 2017-07-15 NOTE — Social Work (Signed)
Referred to [facility] Team, is [place] resident.  Andrick Rust, LCSW Lead Clinical Social Worker Phone:  [phone]  

## 2017-07-15 NOTE — Progress Notes (Signed)
Patient ID: [name], female   DOB: 07/25/1986, 31 y.o.   MRN: [id number]  Pt is currently in bed with eyes closed. Breathing is easy and unlabored. Will continue to monitor.

## 2017-07-15 NOTE — Progress Notes (Signed)
Patient ID: Monique LeydenPansy P [name], female   DOB: 01/31/1986, 31 y.o.   MRN: [id number]  Pt is currently in bed with eyes closed. Audibly snoring. Respirations easy and unlabored. Will continue to monitor.

## 2017-07-15 NOTE — Progress Notes (Signed)
Recreation Therapy Notes  Date: 07/15/17 Time: 1000 Location: 500 Hall Dayroom  Group Topic: Self-Expression  Goal Area(s) Addresses:  Patient will successfully identify the things they are thankful for. Patient will successfully identify benefit of expressing themselves in a healthy manner.  Behavioral Response: Engaged  Intervention: Paper, markers  Activity: What I'm Thankful For.  Patients were given a blank sheet of paper outlined with a picture frame.  Patients were to identify the things they are thankful for.  Patients could express their themselves through poem, song, or any appropriate way they choose.   Education:  Self-Expression, Building control surveyorDischarge Planning.   Education Outcome: Acknowledges education/In group clarification offered/Needs additional education  Clinical Observations/Feedback: Pt stated she was thankful for her father who passed in 2009.  Pt stated she was also thankful for her Laney Potashana that "means well", nieces/nephews and "the little things".  Pt was more engaged and active in group.     Caroll RancherMarjette Marlita Keil, LRT/CTRS         Caroll RancherLindsay, Barbarajean Kinzler A 07/15/2017 11:29 AM

## 2017-07-15 NOTE — Progress Notes (Addendum)
Patient ID: [name], female   DOB: 06/12/1986, 31 y.o.   MRN: [id number]  12:45 AM   Per Maryjean Mornharles Kober, PA orders will offer PO Haldol 10mg  and Cogentin 0.5mg  for agitation if needed. If patient refuses POs, see alternative orders. Staff will continue to monitor patient closely at this time. Pt is currently lying in bed, pt foot is visibly shaking and she can be heard talking to herself. Will continue to monitor for increased agitation.

## 2017-07-15 NOTE — Progress Notes (Signed)
Group Note:   The patient shared in group that she had a "pretty good" day. She verbalized that she had a good visit with her mother and spoke with her doctor as well. She did not provide many details about her day and wanted to talk about issues dating back to 2009 and her stepfather. Her goal for tomorrow is to acquire feminine hygiene products.

## 2017-07-15 NOTE — Progress Notes (Addendum)
Patient ID: Collene LeydenPansy P Nevins, female   DOB: 03/22/1986, 31 y.o.   MRN: [id number]  Pt restless, visibly agitated. Pt continuously coming out of her room, sitting in phone alcove. Pt unable to contract for safety. Makes intermittent loud outbursts. Pt attempts to enter another patients room. Tori, AC notified. Will contact provider about potential 1:1.

## 2017-07-15 NOTE — Progress Notes (Signed)
[facility] MD Progress Note  07/15/2017 2:59 PM [name]  MRN:  [id number]  Subjective: [name] reports, "I have been stuck  5 times since being here. My mother wants to boot out of the house. I pay half of the rent. I can't get along with the people behind the desk. They took my wrist band off. Is there a way that I can live peacefully?"  Objective: [name] is a 31 y/o F with history of schizoaffective disorder who was admitted with worsening symptoms of psychosis. Pt was brought in by police initially to [facility] where she presented with paranoia, disorganization, and recent history of poor medication adherence as an outpatient for several months. Upon interview, pt presents as generally disorganized, tangential, and bizarre. She is a poor historian overall, having difficulty providing a narrative of the events prior to hospitalization.  When asked why she came to the hospital, pt responds, "Sometimes my mom has a tendency to make me feel like she's angry" . Today 07-15-17, on a follow-up care assessment, [name] presents bizarre, tangential, uncoordinated. She remains a poor historian. She is unable to make any concrete statements. She is actively responding to some internal stimuli. She is unable to present any useful information about her state of mind, reasons for being in the hospital or response to her medications. She remains very psychotic at this time. She is refusing to take medications. Will continue hospitalization & current treatment already in progress.   Principal Problem: Schizoaffective disorder, bipolar type (HCC)  Diagnosis:   Patient Active Problem List   Diagnosis Date Noted  . Schizophrenia, paranoid type (HCC) [F20.0] 07/14/2017  . Somatic dysfunction of pelvic region [M99.05] 10/26/2014  . Paranoia (HCC) [F22]   . Schizoaffective disorder, bipolar type (HCC) [F25.0] 03/11/2012   Total Time spent with patient: 30 minutes  Past Psychiatric History: See H&P  Past Medical  History:  Past Medical History:  Diagnosis Date  . Bipolar 1 disorder (HCC) 2011  . Paranoia (HCC)   . Schizoaffective disorder (HCC)    History reviewed. No pertinent surgical history.  Family History:  Family History  Problem Relation Age of Onset  . Cancer Father        lymphoma  . Hypertension Mother   . Diabetes Neg Hx   . Heart disease Neg Hx    Family Psychiatric  History: See H&P.  Social History:  Social History   Substance and Sexual Activity  Alcohol Use No   Comment: Pt denies      Social History   Substance and Sexual Activity  Drug Use No   Comment: Pt denies    Social History   Socioeconomic History  . Marital status: Single    Spouse name: None  . Number of children: 0  . Years of education: some colle  . Highest education level: None  Social Needs  . Financial resource strain: None  . Food insecurity - worry: None  . Food insecurity - inability: None  . Transportation needs - medical: None  . Transportation needs - non-medical: None  Occupational History  . Occupation: [name]     Comment: trying to start   Tobacco Use  . Smoking status: Never Smoker  . Smokeless tobacco: Never Used  Substance and Sexual Activity  . Alcohol use: No    Comment: Pt denies   . Drug use: No    Comment: Pt denies  . Sexual activity: No    Birth control/protection: Abstinence, None  Other  Topics Concern  . None  Social History Narrative   Work or School: not working   Home Situation: lives with mother   Spiritual Beliefs: Christian   Lifestyle: no regular exercise; healthy diet         Additional Social History:   Sleep: Poor  Appetite:  Fair  Current Medications: Current Facility-Administered Medications  Medication Dose Route Frequency Provider Last Rate Last Dose  . acetaminophen (TYLENOL) tablet 650 mg  650 mg Oral Q6H PRN Laveda AbbeParks, Laurie Britton, NP      . alum & mag hydroxide-simeth (MAALOX/MYLANTA) 200-200-20 MG/5ML suspension 30 mL  30  mL Oral Q4H PRN Laveda AbbeParks, Laurie Britton, NP      . benztropine (COGENTIN) tablet 0.5 mg  0.5 mg Oral BID PRN Micheal Likensainville, Christopher T, MD      . benztropine (COGENTIN) tablet 0.5 mg  0.5 mg Oral Once Court JoyKober, Charles E, PA-C      . diphenhydrAMINE (BENADRYL) injection 25 mg  25 mg Intramuscular Once Court JoyKober, Charles E, PA-C      . haloperidol (HALDOL) tablet 10 mg  10 mg Oral Once Court JoyKober, Charles E, PA-C       Or  . haloperidol lactate (HALDOL) injection 10 mg  10 mg Intramuscular Once Court JoyKober, Charles E, PA-C      . hydrOXYzine (ATARAX/VISTARIL) tablet 25 mg  25 mg Oral TID PRN Laveda AbbeParks, Laurie Britton, NP      . magnesium hydroxide (MILK OF MAGNESIA) suspension 30 mL  30 mL Oral Daily PRN Laveda AbbeParks, Laurie Britton, NP      . paliperidone (INVEGA) 24 hr tablet 6 mg  6 mg Oral Daily Micheal Likensainville, Christopher T, MD      . traZODone (DESYREL) tablet 50 mg  50 mg Oral QHS PRN Laveda AbbeParks, Laurie Britton, NP        Lab Results: No results found for this or any previous visit (from the past 48 hour(s)).  Blood Alcohol level:  Lab Results  Component Value Date   [facility] <5 01/27/2015   ETH <11 06/12/2012   Metabolic Disorder Labs: Lab Results  Component Value Date   HGBA1C 5.9 07/27/2014   No results found for: PROLACTIN Lab Results  Component Value Date   CHOL 179 07/27/2014   TRIG 49 07/27/2014   HDL 44 07/27/2014   CHOLHDL 5 06/21/2014   VLDL 10 07/27/2014   LDLCALC 125 (H) 07/27/2014   LDLCALC 100 (H) 06/21/2014   Physical Findings: AIMS: Facial and Oral Movements Muscles of Facial Expression: None, normal Lips and Perioral Area: None, normal Jaw: None, normal Tongue: None, normal,Extremity Movements Upper (arms, wrists, hands, fingers): None, normal Lower (legs, knees, ankles, toes): None, normal, Trunk Movements Neck, shoulders, hips: None, normal, Overall Severity Severity of abnormal movements (highest score from questions above): None, normal Incapacitation due to abnormal movements: None,  normal Patient's awareness of abnormal movements (rate only patient's report): No Awareness, Dental Status Current problems with teeth and/or dentures?: No Does patient usually wear dentures?: No  CIWA:    COWS:     Musculoskeletal: Strength & Muscle Tone: within normal limits Gait & Station: Wobbles when she ambulates Patient leans: N/A  Psychiatric Specialty Exam: Physical Exam  Nursing note and vitals reviewed.   Review of Systems  Psychiatric/Behavioral: Positive for hallucinations (Patient is responding heavily to some internal stimuli). The patient has insomnia.     Blood pressure 115/74, pulse 81, temperature 98.8 F (37.1 C), temperature source Oral, resp. rate 16, height 5\' 3"  (1.6  m), weight 64.4 kg (142 lb).Body mass index is 25.15 kg/m.  General Appearance: Bizarre, Casual and Disheveled  Eye Contact:  Good  Speech:  Garbled and Pressured  Volume:  Normal  Mood:  Anxious and Irritable  Affect:  Appropriate, Flat and Labile  Thought Process:  Disorganized and Descriptions of Associations: Loose  Orientation:  Full (Time, Place, and Person)  Thought Content:  Illogical, Delusions, Hallucinations: Auditory Visual, Ideas of Reference:   Paranoia Delusions and Paranoid Ideation  Suicidal Thoughts:  No  Homicidal Thoughts:  No  Memory:  Immediate;   Fair Recent;   Fair Remote;   Fair  Judgement:  Poor  Insight:  Lacking  Psychomotor Activity:  Normal  Concentration:  Concentration: Fair  Recall:  [employer]Fair  Fund of Knowledge:  Poor  Language:  Fair  Akathisia:  No  Handed:    AIMS (if indicated):     Assets:  Health and safety inspectorinancial Resources/Insurance Housing Physical Health Resilience Social Support  ADL's:  Intact  Cognition:  WNL  Sleep:  Number of Hours:3.5     Treatment Plan Summary: Daily contact with patient to assess and evaluate symptoms and progress in treatment: [name] is currently not at her baseline. There are evidence of psychosis/mania. She continues to  require mental health stabilization.  Will continue today 07/15/2017 plan as below except where it is noted.  Schizophrenia:       -Continue paliperidone 6 mg po daily.  Anxiety/psychosis.       -Continue continue the agitation protocols prn.        -Hydroxyzine 25 mg Q 6 hours prn.  Insomnia.         -Continue Trazodone 50 mg po Qhs Encourage group sessions attendance & paticipation. SW to continue to work on the discharge disposition.  Sanjuana KavaNwoko, Jevin Camino I, NP, PMHNP, FNP-BC. 07/15/2017, 2:59 PM

## 2017-07-15 NOTE — Progress Notes (Signed)
Patient ID: [name], female   DOB: 01/10/1986, 31 y.o.   MRN: [id number]  Pt currently lying in bed. Pt noticeably changing positions during noise on the hall. Pt returns back to sleep shortly. Respirations even and unlabored, no agitation noted currently. Will continue to monitor.

## 2017-07-15 NOTE — Progress Notes (Signed)
D. Diksha had been up and visible in milieu this evening, attended and participated in group activity. She had visit from mother and seen interacting appropriately for most of the time with her. She was seen responding internally this evening and refused any attempts at medications this evening. A. Support provided, medication education given. R. Breeona refused medications, will continue to monitor.

## 2017-07-15 NOTE — Progress Notes (Signed)
DAR NOTE: Patient presents with anxious affect and depressed mood.  Denies pain, auditory and visual hallucinations. Pt has been calm most of the shift, stayed in the dayroom more. Refused her medications. Has poor hygiene, refused to shower or care for herself. Rates depression at 0, hopelessness at 0, and anxiety at 0.  Maintained on routine safety checks. Support and encouragement offered as needed.  Attended group and participated. Patient observed socializing with peers in the dayroom.  Offered no complaint.

## 2017-07-15 NOTE — [facility] Group Notes (Signed)
Type of Therapy and Topic:  Group Therapy: Resilience  Participation Level:  Limited   Description of Group:    In this group patients will be encouraged to explore what they see as obstacles to their own wellness and recovery. They will be guided to discuss their thoughts, feelings, and behaviors related to these obstacles. The group will process together ways to cope with barriers, with attention given to specific choices patients can make. Each patient will be challenged to identify changes they are motivated to make in order to overcome their obstacles. This group will be process-oriented, with patients participating in exploration of their own experiences as well as giving and receiving support and challenge from other group members.   Therapeutic Goals: 1. Patient will identify personal and current obstacles as they relate to admission. 2. Patient will identify barriers that currently interfere with their wellness or overcoming obstacles.  3. Patient will identify feelings, thought process and behaviors related to these barriers. 4. Patient will identify two changes they are willing to make to overcome these obstacles:      Summary of Patient Progress  Stayed partial time. Little to no engagement.. No remarks made.    Therapeutic Modalities:   Cognitive Behavioral Therapy Solution Focused Therapy Motivational Interviewing Relapse Prevention Therapy    Johny ShearsCassandra Amirah Goerke MSW, [facility] HospitalCSWA 07/15/2017 2:46 PM

## 2017-07-16 MED ORDER — LORAZEPAM 2 MG/ML IJ SOLN: 2.0000 mg | Freq: Three times a day (TID) | INTRAMUSCULAR | Status: DC | PRN

## 2017-07-16 MED ORDER — HALOPERIDOL LACTATE 5 MG/ML IJ SOLN
10.0000 mg | Freq: Three times a day (TID) | INTRAMUSCULAR | Status: DC | PRN
  Filled 2017-07-16: qty 2

## 2017-07-16 MED ORDER — LORAZEPAM 2 MG/ML IJ SOLN
2.0000 mg | Freq: Once | INTRAMUSCULAR | Status: AC
  Administered 2017-07-16: 2 mg via INTRAMUSCULAR

## 2017-07-16 MED ORDER — HALOPERIDOL LACTATE 5 MG/ML IJ SOLN
10.0000 mg | Freq: Once | INTRAMUSCULAR | Status: AC
  Administered 2017-07-16: 10 mg via INTRAMUSCULAR
  Filled 2017-07-16 (×2): qty 2

## 2017-07-16 MED ORDER — LORAZEPAM 2 MG/ML IJ SOLN
2.0000 mg | Freq: Three times a day (TID) | INTRAMUSCULAR | Status: DC | PRN
Start: 2017-07-16 — End: 2017-07-16

## 2017-07-16 MED ORDER — LORAZEPAM 2 MG/ML IJ SOLN
INTRAMUSCULAR | Status: AC
  Administered 2017-07-16: 2 mg via INTRAMUSCULAR
  Filled 2017-07-16: qty 1

## 2017-07-16 NOTE — Progress Notes (Signed)
Patient ID: [name], female   DOB: 03/14/1986, 31 y.o.   MRN: [id number]   Nursing Progress Note 1900-0730  D) Patient remains isolative to her room this evening. Patient did not get up for snacks or group. Snacks/fluids offered and encouraged. No scheduled medications this evening. Patient appears to be resting in bed and awakens intermittently during checks. Patient denies needs/concerns. Patient denies SI/HI/AVH or pain but does appear to be preoccupied and responding to internal stimuli. Patient remains paranoid and is minimally interactive with staff.   A) Emotional support given. Opportunity for snacks and fluids provided. Opportunities for questions or concerns presented to patient. Labs, vital signs and patient behavior monitored throughout shift. Patient safety maintained with q15 min safety checks. Low fall risk precautions in place.  R) Patient remains safe on the unit at this time. Patient is resting in bed without complaints. Will continue to monitor.

## 2017-07-16 NOTE — Progress Notes (Signed)
Patient ID: Monique LeydenPansy P Willits, female   DOB: 10/02/1985, 31 y.o.   MRN: [id number]    D: Pt has been in the bed most of the day, she did not attend any groups nor did she engage in any treatment. Pt refused all morning medication and also refused to eat. Pt refused to fill out patient self inventory sheet, she reported that she just wanted to be left alone. Pt also refused meals and to do EKG. Pt remains very delusion and continues to respond to internal stimuli. Pt reported being negative SI/HI, no AH/VH noted. A: 15 min checks continued for patient safety. R: Pt safety maintained.

## 2017-07-16 NOTE — Progress Notes (Signed)
Report received. Patient observed sitting on bed in room yelling out occasionally. Patient states she see a cat in the heat vent. Patient reassured she was safe. Will consult with Maryjean Mornharles Kober PA. Monitoring of patient continues. Patient remains safe on unit.

## 2017-07-16 NOTE — Progress Notes (Signed)
Patient ID: Monique LeydenPansy [name], female   DOB: 08/08/1986, 31 y.o.   MRN: [id number]    Pt refused morning medication and EKG. Jacquelyne BalintShalita [name] RN

## 2017-07-16 NOTE — Progress Notes (Signed)
Called to pt room-+CERT-pt agitated-resisting medication ordered for psychotic behavior/disturbing floor. No antipsychotic/Haldol dose today.Reordered Haldol 10 mg  X 1 PO or IM and Ativan 2 mg IM.pending ability to take Invega PO. There were concerns about current EKG (there was a 2015 EKG on chart) with Invega order yesterday.Will order EKG as well so Invega order can be safely given with regard to QT interval.Pt had taken Haldol in past.

## 2017-07-16 NOTE — Progress Notes (Signed)
Did not attend group 

## 2017-07-16 NOTE — Progress Notes (Signed)
[facility] MD Progress Note  07/16/2017 8:45 AM [name]  MRN:  [id number]   Subjective: Patient agitated overnight, received IM haloperidol.  EKG ordered for monitoring.  Patient refuses to interact with writer, withdrawn.   Principal Problem: Schizoaffective disorder, bipolar type (HCC) Diagnosis:   Patient Active Problem List   Diagnosis Date Noted  . Schizophrenia, paranoid type (HCC) [F20.0] 07/14/2017  . Somatic dysfunction of pelvic region [M99.05] 10/26/2014  . Paranoia (HCC) [F22]   . Schizoaffective disorder, bipolar type (HCC) [F25.0] 03/11/2012   Total Time spent with patient: 15 minutes  Past Psychiatric History: See intake H&P for full details. Reviewed, with no updates at this time.   Past Medical History:  Past Medical History:  Diagnosis Date  . Bipolar 1 disorder (HCC) 2011  . Paranoia (HCC)   . Schizoaffective disorder (HCC)    History reviewed. No pertinent surgical history. Family History:  Family History  Problem Relation Age of Onset  . Cancer Father        lymphoma  . Hypertension Mother   . Diabetes Neg Hx   . Heart disease Neg Hx    Family Psychiatric  History: See intake H&P for full details. Reviewed, with no updates at this time.  Social History:  Social History   Substance and Sexual Activity  Alcohol Use No   Comment: Pt denies      Social History   Substance and Sexual Activity  Drug Use No   Comment: Pt denies    Social History   Socioeconomic History  . Marital status: Single    Spouse name: None  . Number of children: 0  . Years of education: some colle  . Highest education level: None  Social Needs  . Financial resource strain: None  . Food insecurity - worry: None  . Food insecurity - inability: None  . Transportation needs - medical: None  . Transportation needs - non-medical: None  Occupational History  . Occupation: [name]     Comment: trying to start   Tobacco Use  . Smoking status: Never Smoker  .  Smokeless tobacco: Never Used  Substance and Sexual Activity  . Alcohol use: No    Comment: Pt denies   . Drug use: No    Comment: Pt denies  . Sexual activity: No    Birth control/protection: Abstinence, None  Other Topics Concern  . None  Social History Narrative   Work or School: not working   Home Situation: lives with mother   Spiritual Beliefs: Christian   Lifestyle: no regular exercise; healthy diet         Additional Social History:                         Sleep: Fair  Appetite:  NA  Current Medications: Current Facility-Administered Medications  Medication Dose Route Frequency Provider Last Rate Last Dose  . acetaminophen (TYLENOL) tablet 650 mg  650 mg Oral Q6H PRN Laveda Abbe, NP      . alum & mag hydroxide-simeth (MAALOX/MYLANTA) 200-200-20 MG/5ML suspension 30 mL  30 mL Oral Q4H PRN Laveda Abbe, NP      . benztropine (COGENTIN) tablet 0.5 mg  0.5 mg Oral BID PRN Micheal Likens, MD      . benztropine (COGENTIN) tablet 0.5 mg  0.5 mg Oral Once Court Joy, PA-C      . diphenhydrAMINE (BENADRYL) injection 25 mg  25 mg Intramuscular  Once Kober, Charles E, PA-C      . haloperidol (HALDOL) tablet 10 mg  10 mg Oral Once Court JoyKober, Charles E, PA-C       Or  . haloperidol lCourt Joyactate (HALDOL) injection 10 mg  10 mg Intramuscular Once Court JoyKober, Charles E, PA-C      . hydrOXYzine (ATARAX/VISTARIL) tablet 25 mg  25 mg Oral TID PRN Laveda AbbeParks, Laurie Britton, NP      . magnesium hydroxide (MILK OF MAGNESIA) suspension 30 mL  30 mL Oral Daily PRN Laveda AbbeParks, Laurie Britton, NP      . paliperidone (INVEGA) 24 hr tablet 6 mg  6 mg Oral Daily Micheal Likensainville, Christopher T, MD      . traZODone (DESYREL) tablet 50 mg  50 mg Oral QHS PRN Laveda AbbeParks, Laurie Britton, NP        Lab Results: No results found for this or any previous visit (from the past 48 hour(s)).  Blood Alcohol level:  Lab Results  Component Value Date   [facility] <5 01/27/2015   ETH <11 06/12/2012     Metabolic Disorder Labs: Lab Results  Component Value Date   HGBA1C 5.9 07/27/2014   No results found for: PROLACTIN Lab Results  Component Value Date   CHOL 179 07/27/2014   TRIG 49 07/27/2014   HDL 44 07/27/2014   CHOLHDL 5 06/21/2014   VLDL 10 07/27/2014   LDLCALC 125 (H) 07/27/2014   LDLCALC 100 (H) 06/21/2014    Physical Findings: AIMS: Facial and Oral Movements Muscles of Facial Expression: None, normal Lips and Perioral Area: None, normal Jaw: None, normal Tongue: None, normal,Extremity Movements Upper (arms, wrists, hands, fingers): None, normal Lower (legs, knees, ankles, toes): None, normal, Trunk Movements Neck, shoulders, hips: None, normal, Overall Severity Severity of abnormal movements (highest score from questions above): None, normal Incapacitation due to abnormal movements: None, normal Patient's awareness of abnormal movements (rate only patient's report): No Awareness, Dental Status Current problems with teeth and/or dentures?: No Does patient usually wear dentures?: No  CIWA:    COWS:     Musculoskeletal: Strength & Muscle Tone: within normal limits Gait & Station: unsteady Patient leans: N/A  Psychiatric Specialty Exam: Physical Exam  ROS  Blood pressure 115/74, pulse 81, temperature 98.8 F (37.1 C), temperature source Oral, resp. rate 16, height 5\' 3"  (1.6 m), weight 142 lb (64.4 kg).Body mass index is 25.15 kg/m.  General Appearance: Bizarre  Eye Contact:  Poor  Speech:  Blocked  Volume:  na  Mood:  Dysphoric  Affect:  Flat  Thought Process:  NA  Orientation:  NA  Thought Content:  NA  Suicidal Thoughts:  No  Homicidal Thoughts:  No  Memory:  NA  Judgement:  Impaired  Insight:  Lacking  Psychomotor Activity:  Decreased  Concentration:  Concentration: Poor  Recall:  NA  Fund of Knowledge:  NA  Language:  NA  Akathisia:  Negative  Handed:  Right  AIMS (if indicated):     Assets:  Social Support  ADL's:  Intact   Cognition:  WNL  Sleep:  Number of Hours: 3.5   Treatment Plan Summary: 31 year old female with acute psychosis, UDS negative.  History of schizoaffective disorder, bipolar type.  Agitated last evening and required Haldol, refused EKG this morning and refusing medications.  Refusing to interact with Clinical research associatewriter and staff.  PRN medications remain available for acute agitation, but if patient continues to refuse medications, nonemergent force medications may ultimately become necessary.  Requires continued hospitalization for crisis  stabilization, not safe for discharge at this time.  Burnard LeighAlexander Arya Isadora Delorey, MD 07/16/2017, 8:45 AM

## 2017-07-16 NOTE — Plan of Care (Signed)
Safety Care Plan Documentation  Goal: Periods of time without injury will increase Intervention: Patient is on q15 minute safety checks and low fall risk precautions. Patient verbally contracts for safety on the unit. Outcome: Patient remains safe at this time  07/16/2017 9:49 PM - Progressing by Ferrel Loganollazo, Mikaella Escalona A, RN

## 2017-07-16 NOTE — Progress Notes (Signed)
Patient continues to yell out inappropriately and states she is seeing a cat in the heater vent. Patient agitated and disturbing other patients with her yelling. Patient not redirectable. Patient refused all night medications. Patient was ordered Haldol 10 mg IM. A show of support from other staff members was called. When staff arrived patient cooperated and patient administered  medication with verbal support and staff reassurance. Patient did not require any physical contact. Patient is currently in room sitting on bed. Q 15 minute checks in progress and monitoring of patient continues. Patient remains safe on unit.

## 2017-07-17 MED ORDER — HALOPERIDOL 5 MG PO TABS: 10.0000 mg | ORAL_TABLET | Freq: Three times a day (TID) | ORAL | Status: DC | PRN

## 2017-07-17 MED ORDER — HALOPERIDOL LACTATE 5 MG/ML IJ SOLN
10.0000 mg | Freq: Three times a day (TID) | INTRAMUSCULAR | Status: DC | PRN
  Administered 2017-07-18: 10 mg via INTRAMUSCULAR
  Filled 2017-07-17: qty 2

## 2017-07-17 MED ORDER — LORAZEPAM 1 MG PO TABS: 2.0000 mg | ORAL_TABLET | Freq: Three times a day (TID) | ORAL | Status: DC | PRN

## 2017-07-17 MED ORDER — LORAZEPAM 2 MG/ML IJ SOLN
2.0000 mg | Freq: Three times a day (TID) | INTRAMUSCULAR | Status: DC | PRN
  Administered 2017-07-18: 2 mg via INTRAMUSCULAR
  Filled 2017-07-17: qty 1

## 2017-07-17 NOTE — Progress Notes (Signed)
[facility] MD Progress Note  07/17/2017 8:20 AM [name]  MRN:  [id number] Subjective:   Patient seen, chart reviewed and case discussed with nursing staff. She took properidine this morning/   She states that "weird things happened" when she was asked about her sleep. She states that it happened before, and she continues "nurses, I don't know who they are." She then talks about "purple injection." She is unable to elaborate the reason for admission. She says "what?!" and denies taking paliperidone this morning, although she did take it. She denies any dizziness. She denies SI, HI, AH/VH, although she repeatedly looked outside, wall through the entire interview.   Principal Problem: Schizoaffective disorder, bipolar type (HCC) Diagnosis:   Patient Active Problem List   Diagnosis Date Noted  . Schizophrenia, paranoid type (HCC) [F20.0] 07/14/2017  . Somatic dysfunction of pelvic region [M99.05] 10/26/2014  . Paranoia (HCC) [F22]   . Schizoaffective disorder, bipolar type (HCC) [F25.0] 03/11/2012   Total Time spent with patient: 15 minutes  Past Psychiatric History:  See initial H&P for dull details. Reviewed, with no updates at this time.    Past Medical History:  Past Medical History:  Diagnosis Date  . Bipolar 1 disorder (HCC) 2011  . Paranoia (HCC)   . Schizoaffective disorder (HCC)    History reviewed. No pertinent surgical history. Family History:  Family History  Problem Relation Age of Onset  . Cancer Father        lymphoma  . Hypertension Mother   . Diabetes Neg Hx   . Heart disease Neg Hx    Family Psychiatric  History:  See initial H&P for dull details. Reviewed, with no updates at this time.   Social History:  Social History   Substance and Sexual Activity  Alcohol Use No   Comment: Pt denies      Social History   Substance and Sexual Activity  Drug Use No   Comment: Pt denies    Social History   Socioeconomic History  . Marital status: Single   Spouse name: None  . Number of children: 0  . Years of education: some colle  . Highest education level: None  Social Needs  . Financial resource strain: None  . Food insecurity - worry: None  . Food insecurity - inability: None  . Transportation needs - medical: None  . Transportation needs - non-medical: None  Occupational History  . Occupation: [name]     Comment: trying to start   Tobacco Use  . Smoking status: Never Smoker  . Smokeless tobacco: Never Used  Substance and Sexual Activity  . Alcohol use: No    Comment: Pt denies   . Drug use: No    Comment: Pt denies  . Sexual activity: No    Birth control/protection: Abstinence, None  Other Topics Concern  . None  Social History Narrative   Work or School: not working   Home Situation: lives with mother   Spiritual Beliefs: Christian   Lifestyle: no regular exercise; healthy diet         Additional Social History:                         Sleep: Poor  Appetite:  Good  Current Medications: Current Facility-Administered Medications  Medication Dose Route Frequency Provider Last Rate Last Dose  . acetaminophen (TYLENOL) tablet 650 mg  650 mg Oral Q6H PRN Laveda Abbe, NP      .  alum & mag hydroxide-simeth (MAALOX/MYLANTA) 200-200-20 MG/5ML suspension 30 mL  30 mL Oral Q4H PRN Laveda AbbeParks, Laurie Britton, NP      . benztropine (COGENTIN) tablet 0.5 mg  0.5 mg Oral BID PRN Micheal Likensainville, Christopher T, MD      . haloperidol lactate (HALDOL) injection 10 mg  10 mg Intramuscular Q8H PRN Burnard LeighEksir, Alexander Arya, MD      . hydrOXYzine (ATARAX/VISTARIL) tablet 25 mg  25 mg Oral TID PRN Laveda AbbeParks, Laurie Britton, NP      . LORazepam (ATIVAN) injection 2 mg  2 mg Intramuscular Q8H PRN Nira ConnBerry, Jason A, NP      . magnesium hydroxide (MILK OF MAGNESIA) suspension 30 mL  30 mL Oral Daily PRN Laveda AbbeParks, Laurie Britton, NP      . paliperidone (INVEGA) 24 hr tablet 6 mg  6 mg Oral Daily Micheal Likensainville, Christopher T, MD      . traZODone  (DESYREL) tablet 50 mg  50 mg Oral QHS PRN Laveda AbbeParks, Laurie Britton, NP        Lab Results: No results found for this or any previous visit (from the past 48 hour(s)).  Blood Alcohol level:  Lab Results  Component Value Date   [facility] 01/27/2015   ETH <11 06/12/2012    Metabolic Disorder Labs: Lab Results  Component Value Date   HGBA1C 5.9 07/27/2014   No results found for: PROLACTIN Lab Results  Component Value Date   CHOL 179 07/27/2014   TRIG 49 07/27/2014   HDL 44 07/27/2014   CHOLHDL 5 06/21/2014   VLDL 10 07/27/2014   LDLCALC 125 (H) 07/27/2014   LDLCALC 100 (H) 06/21/2014    Physical Findings: AIMS: Facial and Oral Movements Muscles of Facial Expression: None, normal Lips and Perioral Area: None, normal Jaw: None, normal Tongue: None, normal,Extremity Movements Upper (arms, wrists, hands, fingers): None, normal Lower (legs, knees, ankles, toes): None, normal, Trunk Movements Neck, shoulders, hips: None, normal, Overall Severity Severity of abnormal movements (highest score from questions above): None, normal Incapacitation due to abnormal movements: None, normal Patient's awareness of abnormal movements (rate only patient's report): No Awareness, Dental Status Current problems with teeth and/or dentures?: No Does patient usually wear dentures?: No  CIWA:    COWS:     Musculoskeletal: Strength & Muscle Tone: within normal limits Gait & Station: normal Patient leans: N/A  Psychiatric Specialty Exam: Physical Exam  Review of Systems  Unable to perform ROS: Psychiatric disorder    Blood pressure 109/77, pulse 99, temperature 98.3 F (36.8 C), temperature source Oral, resp. rate 18, height 5\' 3"  (1.6 m), weight 142 lb (64.4 kg).Body mass index is 25.15 kg/m.  General Appearance: Fairly Groomed  Eye Contact:  Good  Speech:  Clear and Coherent  Volume:  Normal  Mood:  "good"  Affect:  Restricted  Thought Process:  Disorganized  Orientation:  Full (Time,  Place, and Person)  Thought Content:  Hallucinations: responding to internal stimuli and Paranoid Ideation  Suicidal Thoughts:  No  Homicidal Thoughts:  No  Memory:  difficult to assess due to current mental state  Judgement:  Poor  Insight:  Lacking  Psychomotor Activity:  Normal  Concentration:  Concentration: Fair and Attention Span: Fair  Recall:  difficult to assess due to current mental state  Fund of Knowledge:  difficult to assess due to current mental state  Language:  Good  Akathisia:  No  Handed:  Right  AIMS (if indicated):     Assets:  Physical  Health  ADL's:  Intact  Cognition:  WNL  Sleep:  Number of Hours: 2.5   [name] is a 31 year old female, history of schizoaffective disorder, bipolar type, brought by police for worsening psychosis. UDS negative.   Exam is notable for tangentiality and responding to internal stimuli. She did take a first dose of paliperidone. Will continue paliperidone for schizoaffective disorder. Will consider uptitration as she tolerates.    - Continue paliperidone 6 mg daily  - Pending EKG for QTc monitoring   Treatment Plan Summary: Daily contact with patient to assess and evaluate symptoms and progress in treatment  Neysa Hottereina Jeramie Scogin, MD 07/17/2017, 8:20 AM

## 2017-07-17 NOTE — [facility] Group Notes (Signed)
07/17/2017 1:15pm  Type of Therapy/Topic:  Group Therapy:  Feelings about Diagnosis  Participation Level:  Active   Description of Group:   This group will allow patients to explore their thoughts and feelings about diagnoses they have received. Patients will be guided to explore their level of understanding and acceptance of these diagnoses. Facilitator will encourage patients to process their thoughts and feelings about the reactions of others to their diagnosis and will guide patients in identifying ways to discuss their diagnosis with significant others in their lives. This group will be process-oriented, with patients participating in exploration of their own experiences, giving and receiving support, and processing challenge from other group members.   Therapeutic Goals: 1. Patient will demonstrate understanding of diagnosis as evidenced by identifying two or more symptoms of the disorder 2. Patient will be able to express two feelings regarding the diagnosis 3. Patient will demonstrate their ability to communicate their needs through discussion and/or role play  Summary of Patient Progress: Stayed the entire time, engaged when addressed. [name] says that for her it's important to make sure that the medications that she takes for her diagnosis doesn't clash with any other medications because they are very strong medications. She engaged and listened when others were talking. Mood was good.   Therapeutic Modalities:   Cognitive Behavioral Therapy Brief Therapy Feelings Identification    [name], Alexander MtLCSW 07/17/2017 12:33 PM

## 2017-07-17 NOTE — Progress Notes (Signed)
D: Pt presents with blunted affect and irritable mood on initial contact. Denies SI, HI, AVh and pain, however, pt is disorganized, observed talking / whispering to self / responding to internal stimuli on interactions. Disheveled, yelling at staff at the time "don't come close to me, the doctor said I'm going home today, I'm not staying here". Paranoid on interactions as well, believes staff were holding her against what the doctor said (D/C), EKG order was false, "my doctor is the tall man, not that short woman, I don't believe you". Pt showered and changed clothes with prompts. Cooperative with EKG procedure. Took her medication via PO route with encouragement. Visible in milieu / hall at intervals during shift.  A: Emotional support and availability offered to pt. Encouraged to comply with current treatment regimen. Compliant with medications when offered. Unit restriction maintained as ordered. Q 15 minutes safety checks continues without incidents.  R: Pt attended unit group and was verbally engaged. Compliant with medications. Denies adverse drug reactions at this time. Tolerated EKG and all PO intake well. POC remains effective for mood stability and safety.

## 2017-07-17 NOTE — Tx Team (Signed)
Interdisciplinary Treatment and Diagnostic Plan Update  07/17/2017 Time of Session:1pm [name] MRN: [id number]  Principal Diagnosis: Schizoaffective disorder, bipolar type [facility](HCC)  Secondary Diagnoses: Principal Problem:   Schizoaffective disorder, bipolar type (HCC)   Current Medications:  Current Facility-Administered Medications  Medication Dose Route Frequency Provider Last Rate Last Dose  . acetaminophen (TYLENOL) tablet 650 mg  650 mg Oral Q6H PRN Laveda AbbeParks, Laurie Britton, NP      . alum & mag hydroxide-simeth (MAALOX/MYLANTA) 200-200-20 MG/5ML suspension 30 mL  30 mL Oral Q4H PRN Laveda AbbeParks, Laurie Britton, NP      . benztropine (COGENTIN) tablet 0.5 mg  0.5 mg Oral BID PRN Micheal Likensainville, Christopher T, MD      . haloperidol lactate (HALDOL) injection 10 mg  10 mg Intramuscular Q8H PRN Burnard LeighEksir, Alexander Arya, MD      . hydrOXYzine (ATARAX/VISTARIL) tablet 25 mg  25 mg Oral TID PRN Laveda AbbeParks, Laurie Britton, NP      . LORazepam (ATIVAN) injection 2 mg  2 mg Intramuscular Q8H PRN Nira ConnBerry, Jason A, NP      . magnesium hydroxide (MILK OF MAGNESIA) suspension 30 mL  30 mL Oral Daily PRN Laveda AbbeParks, Laurie Britton, NP      . paliperidone (INVEGA) 24 hr tablet 6 mg  6 mg Oral Daily Micheal Likensainville, Christopher T, MD   6 mg at 07/17/17 [id number]  . traZODone (DESYREL) tablet 50 mg  50 mg Oral QHS PRN Laveda AbbeParks, Laurie Britton, NP       PTA Medications: Medications Prior to Admission  Medication Sig Dispense Refill Last Dose  . benztropine (COGENTIN) 0.5 MG tablet Take 1 tablet (0.5 mg total) by mouth 2 (two) times daily as needed for tremors. (Patient not taking: Reported on 07/12/2017) 6 tablet 0 Not Taking at Unknown time  . Coconut Oil 1000 MG CAPS Take 2 capsules by mouth 2 (two) times daily.   Not Taking at Unknown time  . haloperidol (HALDOL) 10 MG tablet Take 1 tablet (10 mg total) by mouth at bedtime. (Patient not taking: Reported on 07/12/2017) 5 tablet 0 Not Taking at Unknown time  . hydrocortisone (ANUSOL-HC)  2.5 % rectal cream Apply rectally 2 times daily (Patient not taking: Reported on 07/12/2017) 28.35 g 1 Not Taking at Unknown time  . Multiple Vitamins-Minerals (MULTIVITAMIN WITH MINERALS) tablet Take 1 tablet daily by mouth.   Past Week at Unknown time  . trimethoprim-polymyxin b (POLYTRIM) ophthalmic solution Place 1 drop into the right eye 4 (four) times daily. For 5 days (Patient not taking: Reported on 05/21/2016) 10 mL 0 Not Taking at Unknown time    Patient Stressors: Medication change or noncompliance  Patient Strengths: Ability for insight Average or above average intelligence Communication skills General fund of knowledge Supportive family/friends  Treatment Modalities: Medication Management, Group therapy, Case management,  1 to 1 session with clinician, Psychoeducation, Recreational therapy.   Physician Treatment Plan for Primary Diagnosis: Schizoaffective disorder, bipolar type (HCC) Long Term Goal(s): Improvement in symptoms so as ready for discharge Improvement in symptoms so as ready for discharge   Short Term Goals: Ability to identify and develop effective coping behaviors will improve Compliance with prescribed medications will improve  Medication Management: Evaluate patient's response, side effects, and tolerance of medication regimen.  Therapeutic Interventions: 1 to 1 sessions, Unit Group sessions and Medication administration.  Evaluation of Outcomes: Progressing  Physician Treatment Plan for Secondary Diagnosis: Principal Problem:   Schizoaffective disorder, bipolar type (HCC)  Long Term Goal(s): Improvement in symptoms  so as ready for discharge Improvement in symptoms so as ready for discharge   Short Term Goals: Ability to identify and develop effective coping behaviors will improve Compliance with prescribed medications will improve     Medication Management: Evaluate patient's response, side effects, and tolerance of medication  regimen.  Therapeutic Interventions: 1 to 1 sessions, Unit Group sessions and Medication administration.  Evaluation of Outcomes: Progressing   RN Treatment Plan for Primary Diagnosis: Schizoaffective disorder, bipolar type (HCC) Long Term Goal(s): Knowledge of disease and therapeutic regimen to maintain health will improve  Short Term Goals: Compliance with prescribed medications will improve  Medication Management: RN will administer medications as ordered by provider, will assess and evaluate patient's response and provide education to patient for prescribed medication. RN will report any adverse and/or side effects to prescribing provider.  Therapeutic Interventions: 1 on 1 counseling sessions, Psychoeducation, Medication administration, Evaluate responses to treatment, Monitor vital signs and CBGs as ordered, Perform/monitor CIWA, COWS, AIMS and Fall Risk screenings as ordered, Perform wound care treatments as ordered.  Evaluation of Outcomes: Progressing   LCSW Treatment Plan for Primary Diagnosis: Schizoaffective disorder, bipolar type (HCC) Long Term Goal(s): Safe transition to appropriate next level of care at discharge, Engage patient in therapeutic group addressing interpersonal concerns.  Short Term Goals: Engage patient in aftercare planning with referrals and resources, Facilitate acceptance of mental health diagnosis and concerns and Identify triggers associated with mental health/substance abuse issues  Therapeutic Interventions: Assess for all discharge needs, 1 to 1 time with Social worker, Explore available resources and support systems, Assess for adequacy in community support network, Educate family and significant other(s) on suicide prevention, Complete Psychosocial Assessment, Interpersonal group therapy.  Evaluation of Outcomes: Progressing   Progress in Treatment: Attending groups: Yes. Participating in groups: Yes. Taking medication as prescribed:  Yes. Toleration medication: Yes. Family/Significant other contact made: Yes, individual(s) contacted:  Mother Patient understands diagnosis: Yes. Discussing patient identified problems/goals with staff: Yes. Medical problems stabilized or resolved: Yes Denies suicidal/homicidal ideation: Yes. Issues/concerns per patient self-inventory: No. Other: New problem(s) identified: Yes, Describe:  Patient has concerns about mixing her medications with other meications.   New Short Term/Long Term Goal(s): "I know that I need to take my medication".   Discharge Plan or Barriers: Plans to return to her mother and engage in outpatient treatment.   Reason for Continuation of Hospitalization: Medication stabilization  Estimated Length of Stay: 07/22/2017  Attendees: Patient: [name] 07/17/2017 4:05 PM  Physician: R. Vanetta ShawlHisada, MD 07/17/2017 4:05 PM  Nursing: Maryjo RochesterShalita RN 07/17/2017 4:05 PM  RN Care Manager: 07/17/2017 4:05 PM  Social Worker: Johny Shearsassandra Cameran Pettey, LCSWA 07/17/2017 4:05 PM  Recreational Therapist:  07/17/2017 4:05 PM  Other:  07/17/2017 4:05 PM  Other:  07/17/2017 4:05 PM  Other: 07/17/2017 4:05 PM    Scribe for Treatment Team: Johny Shearsassandra  Gemayel Mascio, LCSW 07/17/2017 4:05 PM

## 2017-07-17 NOTE — Progress Notes (Signed)
Pt stated she does not like the [place], she said she was on it in the past and did not like it.

## 2017-07-17 NOTE — Plan of Care (Signed)
Pt safe on the unit at this time 

## 2017-07-17 NOTE — Progress Notes (Signed)
D: Pt denies SI/HI/AVH. Pt is pleasant and cooperative. Pt very disorganized and paranoid, pt stated she was willing to take medicine PO.   A: Pt was offered support and encouragement.  Pt was encourage to attend groups. Q 15 minute checks were done for safety.   R: safety maintained on unit.

## 2017-07-17 NOTE — Progress Notes (Signed)
Patient ID: Monique LeydenPansy [name], female   DOB: 08/11/1986, 31 y.o.   MRN: [id number]   Per MHT, patient is awake when he does his 15 minute safety checks and has been awake for most of the night. Patient denies needs or concerns at this time. Patient is quietly resting in bed at this time. Will continue to support and monitor.

## 2017-07-18 MED ORDER — PALIPERIDONE ER 3 MG PO TB24
3.0000 mg | ORAL_TABLET | Freq: Every evening | ORAL | Status: DC
  Administered 2017-07-18: 3 mg via ORAL
  Filled 2017-07-18 (×3): qty 1

## 2017-07-18 NOTE — Progress Notes (Signed)
D: Pt presents verbally aggressive, irritable when approached scheduled AM medication (Abilify). Per pt "I'm not taking it, the nurse told me last night I don't have to if I sleep, get me doctor". Pt started yelling and running spontaneous down the hall. Unable to be redirected at the time. Pt observed to be disorganized and tangential on interactions. A: PRN Haldol and Ativan administered as ordered with verbal education and effects monitored. Treatment compliance encouraged. Urine sample collected and placed in refrigerator. Unit restriction continues as per order. Q 15 minutes safety checks maintained.  R: Pt has been more cooperative with care and compliant with medication regimen this evening. Tolerates all PO intake well. Pt did not attend groups despite multiple prompts.  Remains safe on unit.

## 2017-07-18 NOTE — [facility] Group Notes (Signed)
[facility] Group Notes: (Clinical Social Work)   07/18/2017      Type of Therapy:  Group Therapy   Participation Level:  Did Not Attend despite MHT prompting   Ambrose MantleMareida Grossman-Orr, LCSW 07/18/2017, 12:34 PM

## 2017-07-18 NOTE — Progress Notes (Addendum)
[facility]  07/18/2017 8:34 AM [name]  MRN:  [id number] Subjective:   Patient seen, chart reviewed and case discussed with nursing staff. She slept 5.75 hours. She refused to take paliperidone this morning; she became agitated and she was given Haldol 10 mg IM and ativan 2 mg IM.   She states that she was "vulnerable" this morning. She repeatedly resuest to be transferred to other hospital. She then focused on injection site then she talks about "mother." She states that she is mother's child and talks about patient rights in the unit. She then asks if there is any law "for this?" She then ruminates on law in a random topic. She refuses to take Depakote (rash), lithium or tegretol. She declines to take any medication. She denies feeling depressed. She denies SI, HI. She denies AH, VH, while she mumbles to herself repeatedly during the interview.   Principal Problem: Schizoaffective disorder, bipolar type (HCC) Diagnosis:   Patient Active Problem List   Diagnosis Date Noted  . Schizophrenia, paranoid type (HCC) [F20.0] 07/14/2017  . Somatic dysfunction of pelvic region [M99.05] 10/26/2014  . Paranoia (HCC) [F22]   . Schizoaffective disorder, bipolar type (HCC) [F25.0] 03/11/2012   Total Time spent with patient: 15 minutes  Past Psychiatric History: See initial H&P for dull details. Reviewed, with no updates at this time.   Past Medical History:  Past Medical History:  Diagnosis Date  . Bipolar 1 disorder (HCC) 2011  . Paranoia (HCC)   . Schizoaffective disorder (HCC)    History reviewed. No pertinent surgical history. Family History:  Family History  Problem Relation Age of Onset  . Cancer Father        lymphoma  . Hypertension Mother   . Diabetes Neg Hx   . Heart disease Neg Hx    Family Psychiatric  History: See initial H&P for dull details. Reviewed, with no updates at this time.  Social History:  Social History   Substance and Sexual Activity  Alcohol Use  No   Comment: Pt denies      Social History   Substance and Sexual Activity  Drug Use No   Comment: Pt denies    Social History   Socioeconomic History  . Marital status: Single    Spouse name: None  . Number of children: 0  . Years of education: some colle  . Highest education level: None  Social Needs  . Financial resource strain: None  . Food insecurity - worry: None  . Food insecurity - inability: None  . Transportation needs - medical: None  . Transportation needs - non-medical: None  Occupational History  . Occupation: [employer]     Comment: trying to start   Tobacco Use  . Smoking status: Never Smoker  . Smokeless tobacco: Never Used  Substance and Sexual Activity  . Alcohol use: No    Comment: Pt denies   . Drug use: No    Comment: Pt denies  . Sexual activity: No    Birth control/protection: Abstinence, None  Other Topics Concern  . None  Social History Narrative   Work or School: not working   Home Situation: lives with mother   Spiritual Beliefs: Christian   Lifestyle: no regular exercise; healthy diet         Additional Social History:                         Sleep: Good  Appetite:  Good  Current Medications: Current Facility-Administered Medications  Medication Dose Route Frequency Provider Last Rate Last Dose  . acetaminophen (TYLENOL) tablet 650 mg  650 mg Oral Q6H PRN Laveda AbbeParks, Laurie Britton, NP      . alum & mag hydroxide-simeth (MAALOX/MYLANTA) 200-200-20 MG/5ML suspension 30 mL  30 mL Oral Q4H PRN Laveda AbbeParks, Laurie Britton, NP      . benztropine (COGENTIN) tablet 0.5 mg  0.5 mg Oral BID PRN Micheal Likensainville, Christopher T, MD      . haloperidol (HALDOL) tablet 10 mg  10 mg Oral Q8H PRN Nira ConnBerry, Jason A, NP       Or  . haloperidol lactate (HALDOL) injection 10 mg  10 mg Intramuscular Q8H PRN Nira ConnBerry, Jason A, NP      . hydrOXYzine (ATARAX/VISTARIL) tablet 25 mg  25 mg Oral TID PRN Laveda AbbeParks, Laurie Britton, NP      . LORazepam (ATIVAN) tablet 2 mg   2 mg Oral Q8H PRN Jackelyn PolingBerry, Jason A, NP       Or  . LORazepam (ATIVAN) injection 2 mg  2 mg Intramuscular Q8H PRN Nira ConnBerry, Jason A, NP      . magnesium hydroxide (MILK OF MAGNESIA) suspension 30 mL  30 mL Oral Daily PRN Laveda AbbeParks, Laurie Britton, NP      . paliperidone (INVEGA) 24 hr tablet 6 mg  6 mg Oral Daily Micheal Likensainville, Christopher T, MD   6 mg at 07/17/17 [phone]  . traZODone (DESYREL) tablet 50 mg  50 mg Oral QHS PRN Laveda AbbeParks, Laurie Britton, NP        Lab Results: No results found for this or any previous visit (from the past 48 hour(s)).  Blood Alcohol level:  Lab Results  Component Value Date   [facility] 01/27/2015   ETH <11 06/12/2012    Metabolic Disorder Labs: Lab Results  Component Value Date   HGBA1C 5.9 07/27/2014   No results found for: PROLACTIN Lab Results  Component Value Date   CHOL 179 07/27/2014   TRIG 49 07/27/2014   HDL 44 07/27/2014   CHOLHDL 5 06/21/2014   VLDL 10 07/27/2014   LDLCALC 125 (H) 07/27/2014   LDLCALC 100 (H) 06/21/2014    Physical Findings: AIMS: Facial and Oral Movements Muscles of Facial Expression: None, normal Lips and Perioral Area: None, normal Jaw: None, normal Tongue: None, normal,Extremity Movements Upper (arms, wrists, hands, fingers): None, normal Lower (legs, knees, ankles, toes): None, normal, Trunk Movements Neck, shoulders, hips: None, normal, Overall Severity Severity of abnormal movements (highest score from questions above): None, normal Incapacitation due to abnormal movements: None, normal Patient's awareness of abnormal movements (rate only patient's report): No Awareness, Dental Status Current problems with teeth and/or dentures?: No Does patient usually wear dentures?: No  CIWA:    COWS:     Musculoskeletal: Strength & Muscle Tone: within normal limits Gait & Station: normal Patient leans: N/A  Psychiatric Specialty Exam: Physical Exam  Review of Systems  Unable to perform ROS: Psychiatric disorder    Blood  pressure 110/78, pulse 97, temperature 98.3 F (36.8 C), temperature source Oral, resp. rate 18, height 5\' 3"  (1.6 m), weight 142 lb (64.4 kg).Body mass index is 25.15 kg/m.  General Appearance: Fairly Groomed  Eye Contact:  Good  Speech:  Pressured  Volume:  Normal  Mood:  "vulnerable"  Affect:  Constricted  Thought Process:  Disorganized  Orientation:  Full (Time, Place, and Person)  Thought Content:  Rumination Responding to internal stimuli  Suicidal  Thoughts:  No  Homicidal Thoughts:  No  Memory:  Immediate;   Fair  Judgement:  Impaired  Insight:  Lacking  Psychomotor Activity:  Increased  Concentration:  Concentration: Poor and Attention Span: Poor  Recall:  unable to assess due to her current mental status  Fund of Knowledge:  unable to assess due to her current mental status  Language:  Good  Akathisia:  No  Handed:  Right  AIMS (if indicated):     Assets:  Physical Health  ADL's:  Intact  Cognition:  WNL  Sleep:  Number of Hours: 5.75   [name] is a 31 year old female with history of schizoaffective disorder, bipolar type, who was brought by police for worsening psychosis UDS negative.   Exam is notable for disorganized thought process and increased psychomotor activity and she is responding to internal stimuli. She has been refusing to take medication (except yesterday) . She lacks insight into her current state and becomes intrusive, screaming at times. Will have non emergent forced medication; will choose Geodon so that she can be given ativan as needed.   - Increase paliperidone 6 mg daily and 3 mg at night.  - Discussed the use of non emergent forced medication with Dr. Ardith Dark. Patient has been asleep during the day and he has not been evaluate patient  despite his attempts (to prioritize patient sleep). Will discuss again tomorrow; will consider geodon 20 mg IM BID as NEFM if indicated. - Checked EKG; QTc 415 msec on 11/23 - Will check urine pregnancy,  TSH   Treatment Plan Summary: Daily contact with patient to assess and evaluate symptoms and progress in treatment  Neysa Hotter, MD 07/18/2017, 8:34 AM

## 2017-07-18 NOTE — Progress Notes (Signed)
Adult Psychoeducational Group Note  Date:  07/18/2017 Time:  9:06 PM  Group Topic/Focus:  Wrap-Up Group:   The focus of this group is to help patients review their daily goal of treatment and discuss progress on daily workbooks.  Participation Level:  Minimal  Participation Quality:  Appropriate  Affect:  Flat  Cognitive:  Oriented  Insight: Limited  Engagement in Group:  Engaged  Modes of Intervention:  Socialization and Support  Additional Comments:  Patient attended and participated in group tonight. She reports that her mother visited with her tonight. The visit went well.   Lita MainsFrancis, Leanna Hamid [facility] 07/18/2017, 9:06 PM

## 2017-07-19 LAB — TSH: TSH: 1.042 u[IU]/mL (ref 0.350–4.500)

## 2017-07-19 MED ORDER — PALIPERIDONE ER 6 MG PO TB24
6.0000 mg | ORAL_TABLET | Freq: Every evening | ORAL | Status: DC
  Administered 2017-07-20 – 2017-07-21 (×2): 6 mg via ORAL
  Filled 2017-07-19 (×4): qty 1

## 2017-07-19 NOTE — Progress Notes (Signed)
Adult Psychoeducational Group Note  Date:  07/19/2017 Time:  11:42 PM  Group Topic/Focus:  Wrap-Up Group:   The focus of this group is to help patients review their daily goal of treatment and discuss progress on daily workbooks.  Participation Level:  Did Not Attend  Participation Quality:  Did Not Attend  Affect:  Did Not Attend  Cognitive:  Did Not Attend  Insight: None  Engagement in Group:  Did Not Attend  Modes of Intervention:  Did Not Attend  Additional Comments:  Pt did not attend evening wrap up group tonight.  Monique FurnaceChristopher  Chaz [name] 07/19/2017, 11:42 PM

## 2017-07-19 NOTE — Plan of Care (Signed)
Nurse discussed depression, anxiety, coping skills with patient.  

## 2017-07-19 NOTE — Progress Notes (Signed)
Patient ID: [name], female   DOB: 05/30/1986, 31 y.o.   MRN: [id number]  DAR Note  Pt observed in the dayroom not interacting. Pt at the time of assessment denied any depression, anxiety, pain, SI, HI or AVH; "I am just pissed off because I was given a shot earlier and I don't think that was right." Pt offered support, encouragement, and safe environment. Pt remained calm and cooperative. 15-minute safety checks continue.

## 2017-07-19 NOTE — Progress Notes (Addendum)
Patient refused invega 6 mg scheduled at 1800.  Patient stated she had already taken invega this morning and does not want another invega medication today.    High fall risk because of fall in ER.  Nurse explained to patient why she is on Unit Restriction.

## 2017-07-19 NOTE — Progress Notes (Signed)
[facility]  07/19/2017 10:07 AM [name]  MRN:  [id number] Subjective:   Patient seen, chart reviewed and case discussed with nursing staff.  - no injection given over 24 hours. Patient took [place] last night.   She states that she is doing well. When she is asked about the group she attended today, she mumbles that "xxx is inclined to ..need to look up." She denies SI, HI, AH/VH. She agrees to continue to take medication. She denies side effect from medication.    Principal Problem: Schizoaffective disorder, bipolar type (HCC) Diagnosis:   Patient Active Problem List   Diagnosis Date Noted  . Schizophrenia, paranoid type (HCC) [F20.0] 07/14/2017  . Somatic dysfunction of pelvic region [M99.05] 10/26/2014  . Paranoia (HCC) [F22]   . Schizoaffective disorder, bipolar type (HCC) [F25.0] 03/11/2012   Total Time spent with patient: 15 minutes  Past Psychiatric History: See initial H&P for dull details. Reviewed, with no updates at this time.   Past Medical History:  Past Medical History:  Diagnosis Date  . Bipolar 1 disorder (HCC) 2011  . Paranoia (HCC)   . Schizoaffective disorder (HCC)    History reviewed. No pertinent surgical history. Family History:  Family History  Problem Relation Age of Onset  . Cancer Father        lymphoma  . Hypertension Mother   . Diabetes Neg Hx   . Heart disease Neg Hx    Family Psychiatric  History: See initial H&P for dull details. Reviewed, with no updates at this time.  Social History:  Social History   Substance and Sexual Activity  Alcohol Use No   Comment: Pt denies      Social History   Substance and Sexual Activity  Drug Use No   Comment: Pt denies    Social History   Socioeconomic History  . Marital status: Single    Spouse name: None  . Number of children: 0  . Years of education: some colle  . Highest education level: None  Social Needs  . Financial resource strain: None  . Food insecurity - worry:  None  . Food insecurity - inability: None  . Transportation needs - medical: None  . Transportation needs - non-medical: None  Occupational History  . Occupation: [employer]     Comment: trying to start   Tobacco Use  . Smoking status: Never Smoker  . Smokeless tobacco: Never Used  Substance and Sexual Activity  . Alcohol use: No    Comment: Pt denies   . Drug use: No    Comment: Pt denies  . Sexual activity: No    Birth control/protection: Abstinence, None  Other Topics Concern  . None  Social History Narrative   Work or School: not working   Home Situation: lives with mother   Spiritual Beliefs: Christian   Lifestyle: no regular exercise; healthy diet         Additional Social History:                         Sleep: Fair  Appetite:  Good  Current Medications: Current Facility-Administered Medications  Medication Dose Route Frequency Provider Last Rate Last Dose  . acetaminophen (TYLENOL) tablet 650 mg  650 mg Oral Q6H PRN Laveda AbbeParks, Laurie Britton, NP      . alum & mag hydroxide-simeth (MAALOX/MYLANTA) 200-200-20 MG/5ML suspension 30 mL  30 mL Oral Q4H PRN Laveda AbbeParks, Laurie Britton, NP      .  benztropine (COGENTIN) tablet 0.5 mg  0.5 mg Oral BID PRN Micheal Likensainville, Christopher T, MD      . haloperidol (HALDOL) tablet 10 mg  10 mg Oral Q8H PRN Nira ConnBerry, Jason A, NP       Or  . haloperidol lactate (HALDOL) injection 10 mg  10 mg Intramuscular Q8H PRN Nira ConnBerry, Jason A, NP   10 mg at 07/18/17 0825  . hydrOXYzine (ATARAX/VISTARIL) tablet 25 mg  25 mg Oral TID PRN Laveda AbbeParks, Laurie Britton, NP      . LORazepam (ATIVAN) tablet 2 mg  2 mg Oral Q8H PRN Jackelyn PolingBerry, Jason A, NP       Or  . LORazepam (ATIVAN) injection 2 mg  2 mg Intramuscular Q8H PRN Nira ConnBerry, Jason A, NP   2 mg at 07/18/17 0825  . magnesium hydroxide (MILK OF MAGNESIA) suspension 30 mL  30 mL Oral Daily PRN Laveda AbbeParks, Laurie Britton, NP      . paliperidone (INVEGA) 24 hr tablet 3 mg  3 mg Oral QPM Neysa HotterHisada, Lynae Pederson, MD   3 mg at 07/18/17  1702  . paliperidone (INVEGA) 24 hr tablet 6 mg  6 mg Oral Daily Micheal Likensainville, Christopher T, MD   6 mg at 07/19/17 0737  . traZODone (DESYREL) tablet 50 mg  50 mg Oral QHS PRN Laveda AbbeParks, Laurie Britton, NP        Lab Results:  Results for orders placed or performed during the hospital encounter of 07/14/17 (from the past 48 hour(s))  TSH     Status: None   Collection Time: 07/19/17  6:44 AM  Result Value Ref Range   TSH 1.042 0.350 - 4.500 uIU/mL    Comment: Performed by a 3rd Generation assay with a functional sensitivity of <=0.01 uIU/mL. Performed at [facility], [address] [place], [place] [address]     Blood Alcohol level:  Lab Results  Component Value Date   [facility] <5 01/27/2015   ETH <11 06/12/2012    Metabolic Disorder Labs: Lab Results  Component Value Date   HGBA1C 5.9 07/27/2014   No results found for: PROLACTIN Lab Results  Component Value Date   CHOL 179 07/27/2014   TRIG 49 07/27/2014   HDL 44 07/27/2014   CHOLHDL 5 06/21/2014   VLDL 10 07/27/2014   LDLCALC 125 (H) 07/27/2014   LDLCALC 100 (H) 06/21/2014    Physical Findings: AIMS: Facial and Oral Movements Muscles of Facial Expression: None, normal Lips and Perioral Area: None, normal Jaw: None, normal Tongue: None, normal,Extremity Movements Upper (arms, wrists, hands, fingers): None, normal Lower (legs, knees, ankles, toes): None, normal, Trunk Movements Neck, shoulders, hips: None, normal, Overall Severity Severity of abnormal movements (highest score from questions above): None, normal Incapacitation due to abnormal movements: None, normal Patient's awareness of abnormal movements (rate only patient's report): No Awareness, Dental Status Current problems with teeth and/or dentures?: No Does patient usually wear dentures?: No  CIWA:    COWS:     Musculoskeletal: Strength & Muscle Tone: within normal limits Gait & Station: normal Patient leans: N/A  Psychiatric Specialty  Exam: Physical Exam  ROS  Blood pressure 105/69, pulse 85, temperature 98.2 F (36.8 C), temperature source Oral, resp. rate 16, height 5\' 3"  (1.6 m), weight 142 lb (64.4 kg).Body mass index is 25.15 kg/m.  General Appearance: Fairly Groomed  Eye Contact:  Fair  Speech:  mumbles, pressured  Volume:  Decreased  Mood:  "good"  Affect:  Flat  Thought Process:  Disorganized  Orientation:  Full (Time, Place, and Person)  Thought Content:  Rumination, responding to internal stimuli  Suicidal Thoughts:  No  Homicidal Thoughts:  No  Memory:  Immediate;   Fair  Judgement:  Impaired  Insight:  Lacking  Psychomotor Activity:  Increased  Concentration:  Concentration: Fair and Attention Span: Fair  Recall:  [employer] of Knowledge:  Fair  Language:  Fair  Akathisia:  No  Handed:  Right  AIMS (if indicated):     Assets:  Communication Skills Desire for Improvement  ADL's:  Intact  Cognition:  WNL  Sleep:  Number of Hours: 6.25   [name] is a 31 year old female with history of schizoaffective disorder, bipolar type, who was brought by police for worsening psychosis. UDS negative.   Although patient continues to demonstrate disorganized thought process and lack of insight, there is slight improvement in increased psychomotor activity, and she is amenable to take medication. Will uptitrate paliperidone at night time dose; consider cosolidation of this medication as indicated in the future.   Reviewed and updated plan as below - Increase paliperidone 6 mg daily BID Although it was once discussed with Dr. Ardith Dark about non emergent forced medication, will defer this today given she has started to take medication.  - Checked EKG; QTc 415 msec on 11/23 - urine pregnancy; pending. TSH; wnl  Treatment Plan Summary: Daily contact with patient to assess and evaluate symptoms and progress in treatment  Neysa Hotter, MD 07/19/2017, 10:07 AM

## 2017-07-19 NOTE — [facility] Group Notes (Signed)
[facility]  Date/Time:  07/19/2017  11:00AM-12:00PM  Type of Therapy and Topic:  Group Therapy:  Music and Mood  Participation Level:  Active   Description of Group: In this process group, members listened to a variety of genres of music and identified that different types of music evoke different responses.  Patients were encouraged to identify music that was soothing for them and music that was energizing for them.  Patients discussed how this knowledge can help with wellness and recovery in various ways including managing depression and anxiety as well as encouraging healthy sleep habits.    Therapeutic Goals: 1. Patients will explore the impact of different varieties of music on mood 2. Patients will verbalize the thoughts they have when listening to different types of music 3. Patients will identify music that is soothing to them as well as music that is energizing to them 4. Patients will discuss how to use this knowledge to assist in maintaining wellness and recovery 5. Patients will explore the use of music as a coping skill  Summary of Patient Progress:  At the beginning of group, patient expressed that she felt anxious and after being very active throughout group, she stated she felt better.  Therapeutic Modalities: Solution Focused Brief Therapy Motivational Interviewing Activity   Ambrose MantleMareida Grossman-Orr, LCSW 07/19/2017 8:30 AM

## 2017-07-19 NOTE — Progress Notes (Signed)
D:  Patient's self inventory sheet, patient sleeps good, no sleep medication given.  Good appetite, normal energy level, good concentration.  Denied depression, hopeless and anxiety.  Denied withdrawals.  Denied SI.  Denied physical problems.  Denied physical pain.  Goal is discharge.  Plans to attend groups.  Plans to discharge home. A:  Medications administered per MD orders.  Emotional support and encouragement given patient. R:  Denied SI and HI, contracts for safety.  Denied A/V hallucinations.  Safety maintained with 15 minute checks.  Patient remains on Unit Restriction.  Ate lunch in dayroom.
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
## 2017-07-20 LAB — PREGNANCY, URINE: Preg Test, Ur: NEGATIVE

## 2017-07-20 MED ORDER — PALIPERIDONE PALMITATE 234 MG/1.5ML IM SUSP
234.0000 mg | Freq: Once | INTRAMUSCULAR | Status: AC
  Administered 2017-07-21: 234 mg via INTRAMUSCULAR
  Filled 2017-07-20: qty 1.5

## 2017-07-20 NOTE — Progress Notes (Signed)
Recreation Therapy Notes  Date: 07/20/17 Time: 1000 Location: 500 Hall Dayroom  Group Topic: Coping Skills  Goal Area(s) Addresses:  Patient will be able to identify positive coping skills. Patient will be able to identify what holds them back. Patient will be able to identify benefits of using coping skills post d/c.  Behavioral Response: Engaged  Intervention: OrthoptistWeb design, pencils  Activity: OrthoptistWeb Design.  Patients were to identify the things they feel have them struck and prevents them from progressing and write them inside of the web.  Once those things were identified, patients were to come up with at least two coping skills for each setback and write them on the outside of the web.   Education: PharmacologistCoping Skills, Building control surveyorDischarge Planning.   Education Outcome: Acknowledges understanding/In group clarification offered/Needs additional education.   Clinical Observations/Feedback: Pt identified her setbacks as jealousy, family/siblings and college degree.  Pt stated her coping skills were "getting proper training, music therapy, meditation and encouraging others".  Pt stated when she is feeling stuck, she wants to scream.  Pt expressed when she does use her coping skills "it makes me feel accomplished and experience positive change".    Caroll RancherMarjette Shakeela Rabadan, LRT/CTRS         Caroll RancherLindsay, Brinlynn Gorton A 07/20/2017 11:24 AM

## 2017-07-20 NOTE — Progress Notes (Signed)
DAR NOTE: Patient presents with calm affect and pleasant mood.  Denies pain, auditory and visual hallucinations.  Described energy level as normal and concentration as good.  Rates depression at 0, hopelessness at 0, and anxiety at 0.  Maintained on routine safety checks.  Medications given as prescribed.  Support and encouragement offered as needed.  Attended group and participated.  States goal for today is "discharge."  Patient visible in the dayroom watching TV with minimal interaction.   Offered no complaint.

## 2017-07-20 NOTE — Progress Notes (Signed)
Adult Psychoeducational Group Note  Date:  07/20/2017 Time:  10:32 PM  Group Topic/Focus:  Wrap-Up Group:   The focus of this group is to help patients review their daily goal of treatment and discuss progress on daily workbooks.  Participation Level:  Minimal  Participation Quality:  Appropriate  Affect:  Flat  Cognitive:  Oriented  Insight: Limited  Engagement in Group:  Engaged  Modes of Intervention:  Socialization and Support  Additional Comments:  Patient attended and participated in group tonight. She reported that today she went to the gym, her mother visited with her which was a good visit.  Today she went for her meals and attended group. She spoke with her doctor and was told she will be going home on Wednesday.  Lita MainsFrancis, Yolande Skoda [facility] 07/20/2017, 10:32 PM

## 2017-07-20 NOTE — Progress Notes (Signed)
[facility]  07/20/2017 2:33 PM [name]  MRN:  [id number] Subjective:  [name] is a 31 y/o F with history of schizoaffective disorder bipolar type who was admitted with worsening symptoms of agitation and psychosis. She was restarted on Invega oral formulation and dose was titrated up. She has shown incremental improvement of her presenting symptoms since being on the unit. Today upon evaluation, she reports she is doing well overall. She shares that her mother visited over the weekend and they had a good visit. She plans to return to living with her mother after discharge. Pt denies SI/HI/AH/VH. She is sleeping well and her appetite is good. She is tolerating her medications without difficulty or side effects. Pt notes that sometimes she has a hard time remembering to take her medications outside of the hospital and we discussed option of trial of long-acting injectable Invega, and pt was in agreement to attempt trial. [facility] and pt will continue on her oral medications with plan to taper off on outpatient basis. We discussed that if pt continues to have improvement of her symptoms that we will anticipate discharge in the coming days, and pt was in agreement. She had no further questions, comments, or concerns.   Principal Problem: Schizoaffective disorder, bipolar type (HCC) Diagnosis:   Patient Active Problem List   Diagnosis Date Noted  . Schizophrenia, paranoid type (HCC) [F20.0] 07/14/2017  . Somatic dysfunction of pelvic region [M99.05] 10/26/2014  . Paranoia (HCC) [F22]   . Schizoaffective disorder, bipolar type (HCC) [F25.0] 03/11/2012   Total Time spent with patient: 30 minutes  Past Psychiatric History: see H&P  Past Medical History:  Past Medical History:  Diagnosis Date  . Bipolar 1 disorder (HCC) 2011  . Paranoia (HCC)   . Schizoaffective disorder (HCC)    History reviewed. No pertinent surgical history. Family History:   Family History  Problem Relation Age of Onset  . Cancer Father        lymphoma  . Hypertension Mother   . Diabetes Neg Hx   . Heart disease Neg Hx    Family Psychiatric  History: see H&P Social History:  Social History   Substance and Sexual Activity  Alcohol Use No   Comment: Pt denies      Social History   Substance and Sexual Activity  Drug Use No   Comment: Pt denies    Social History   Socioeconomic History  . Marital status: Single    Spouse name: None  . Number of children: 0  . Years of education: some colle  . Highest education level: None  Social Needs  . Financial resource strain: None  . Food insecurity - worry: None  . Food insecurity - inability: None  . Transportation needs - medical: None  . Transportation needs - non-medical: None  Occupational History  . Occupation: [employer]     Comment: trying to start   Tobacco Use  . Smoking status: Never Smoker  . Smokeless tobacco: Never Used  Substance and Sexual Activity  . Alcohol use: No    Comment: Pt denies   . Drug use: No    Comment: Pt denies  . Sexual activity: No    Birth control/protection: Abstinence, None  Other Topics Concern  . None  Social History Narrative   Work or School: not working   Home Situation: lives with mother   Spiritual Beliefs: Christian   Lifestyle: no regular exercise;  healthy diet         Additional Social History:                         Sleep: Good  Appetite:  Good  Current Medications: Current Facility-Administered Medications  Medication Dose Route Frequency Provider Last Rate Last Dose  . acetaminophen (TYLENOL) tablet 650 mg  650 mg Oral Q6H PRN Laveda AbbeParks, Laurie Britton, NP      . alum & mag hydroxide-simeth (MAALOX/MYLANTA) 200-200-20 MG/5ML suspension 30 mL  30 mL Oral Q4H PRN Laveda AbbeParks, Laurie Britton, NP      . benztropine (COGENTIN) tablet 0.5 mg  0.5 mg Oral BID PRN Micheal Likensainville, Phineas Mcenroe T, MD      . haloperidol (HALDOL) tablet 10 mg  10  mg Oral Q8H PRN Nira ConnBerry, Jason A, NP       Or  . haloperidol lactate (HALDOL) injection 10 mg  10 mg Intramuscular Q8H PRN Nira ConnBerry, Jason A, NP   10 mg at 07/18/17 0825  . hydrOXYzine (ATARAX/VISTARIL) tablet 25 mg  25 mg Oral TID PRN Laveda AbbeParks, Laurie Britton, NP      . LORazepam (ATIVAN) tablet 2 mg  2 mg Oral Q8H PRN Jackelyn PolingBerry, Jason A, NP       Or  . LORazepam (ATIVAN) injection 2 mg  2 mg Intramuscular Q8H PRN Nira ConnBerry, Jason A, NP   2 mg at 07/18/17 0825  . magnesium hydroxide (MILK OF MAGNESIA) suspension 30 mL  30 mL Oral Daily PRN Laveda AbbeParks, Laurie Britton, NP      . Melene Muller[START ON 07/21/2017] paliperidone (INVEGA SUSTENNA) injection 234 mg  234 mg Intramuscular Once Jolyne Loaainville, Karman Biswell T, MD      . paliperidone (INVEGA) 24 hr tablet 6 mg  6 mg Oral Daily Micheal Likensainville, Chisom Muntean T, MD   6 mg at 07/20/17 0754  . paliperidone (INVEGA) 24 hr tablet 6 mg  6 mg Oral QPM Hisada, Reina, MD      . traZODone (DESYREL) tablet 50 mg  50 mg Oral QHS PRN Laveda AbbeParks, Laurie Britton, NP        Lab Results:  Results for orders placed or performed during the hospital encounter of 07/14/17 (from the past 48 hour(s))  TSH     Status: None   Collection Time: 07/19/17  6:44 AM  Result Value Ref Range   TSH 1.042 0.350 - 4.500 uIU/mL    Comment: Performed by a 3rd Generation assay with a functional sensitivity of <=0.01 uIU/mL. Performed at [facility], [address] [place], [place] [address]     Blood Alcohol level:  Lab Results  Component Value Date   [facility] <[facility] 01/27/2015   ETH <11 06/12/2012    Metabolic Disorder Labs: Lab Results  Component Value Date   HGBA1C 5.9 07/27/2014   No results found for: PROLACTIN Lab Results  Component Value Date   CHOL 179 07/27/2014   TRIG 49 07/27/2014   HDL 44 07/27/2014   CHOLHDL 5 06/21/2014   VLDL 10 07/27/2014   LDLCALC 125 (H) 07/27/2014   LDLCALC 100 (H) 06/21/2014    Physical Findings: AIMS: Facial and Oral Movements Muscles of Facial  Expression: None, normal Lips and Perioral Area: None, normal Jaw: None, normal Tongue: None, normal,Extremity Movements Upper (arms, wrists, hands, fingers): None, normal Lower (legs, knees, ankles, toes): None, normal, Trunk Movements Neck, shoulders, hips: None, normal, Overall Severity Severity of abnormal movements (highest score from questions above): None, normal Incapacitation due  to abnormal movements: None, normal Patient's awareness of abnormal movements (rate only patient's report): No Awareness, Dental Status Current problems with teeth and/or dentures?: No Does patient usually wear dentures?: No  CIWA:  CIWA-Ar Total: 1 COWS:  COWS Total Score: 2  Musculoskeletal: Strength & Muscle Tone: within normal limits Gait & Station: normal Patient leans: N/A  Psychiatric Specialty Exam: Physical Exam  Nursing note and vitals reviewed.   Review of Systems  Constitutional: Negative for chills and fever.  Respiratory: Negative for cough and shortness of breath.   Cardiovascular: Negative for chest pain.  Gastrointestinal: Negative for heartburn and nausea.  Psychiatric/Behavioral: Negative for depression, hallucinations and suicidal ideas. The patient is not nervous/anxious.     Blood pressure (!) 98/59, pulse 92, temperature 98.3 F (36.8 C), temperature source Oral, resp. rate 16, height 5\' 3"  (1.6 m), weight 64.4 kg (142 lb).Body mass index is 25.15 kg/m.  General Appearance: Casual and Disheveled  Eye Contact:  Good  Speech:  Clear and Coherent and Normal Rate  Volume:  Normal  Mood:  Euthymic  Affect:  Congruent  Thought Process:  Disorganized and Goal Directed  Orientation:  Full (Time, Place, and Person)  Thought Content:  Hallucinations: Auditory  Suicidal Thoughts:  No  Homicidal Thoughts:  No  Memory:  Immediate;   Good Recent;   Good Remote;   Good  Judgement:  Fair  Insight:  Fair  Psychomotor Activity:  Normal  Concentration:  Concentration: Good   Recall:  Good  Fund of Knowledge:  Good  Language:  Good  Akathisia:  No  Handed:    AIMS (if indicated):     Assets:  Manufacturing systems engineer Physical Health Resilience Social Support  ADL's:  Intact  Cognition:  WNL  Sleep:  Number of Hours: 3.5    Treatment Plan Summary: Daily contact with patient to assess and evaluate symptoms and progress in treatment and Medication management. Pt demonstrates improvement of her disorganization and psychotic symptoms and she reports that she is tolerating her medications well. She agrees to start [place] starting tomorrow and we will begin to formalize discharge planning.  - Continue inpatient hospitalization - Schizoaffective disorder  - Continue Invega 6mg  po BID  - Start [place] 234mg  IM once on 07/21/17 - EPS  - Continue cogentin 0.5mg  BID prn EPS - anxiety  - Continue atarax 25mg  TID prn anxiety - insomnia  - Continue trazodone 50mg  qhs prn insomnia - Encourage participation in groups and the therapeutic milieu - Discharge planning will be ongoing   Micheal Likens, MD 07/20/2017, 2:33 PM

## 2017-07-21 NOTE — Progress Notes (Signed)
Patient did not attend wrap up group. 

## 2017-07-21 NOTE — Progress Notes (Signed)
Recreation Therapy Notes  Date: 07/21/17 Time: 1000 Location: 500 Hall Dayroom  Group Topic: Anger Management  Goal Area(s) Addresses:  Patient will identify triggers for anger.  Patient will identify physical reaction to anger.   Patient will identify benefit of using coping skills when angry.  Behavioral Response: Engaged  Intervention: Worksheet, pencils  Activity:  Anger Thermometer.  Patients were given a worksheet with a thermometer on it.  Patients were to rank at least 6 things that get them angry from 1-10 (1being the lowest, 10 the highest).  Patients were to then identify coping skills they can use to combat their anger.   Education: Anger Management, Discharge Planning   Education Outcome: Acknowledges education/In group clarification offered/Needs additional education.   Clinical Observations/Feedback: Pt stated anger was a natural emotion.  Pt stated one way people show anger is when their eyes get bigger and their skin changes colors.  Pt stated the things that get her angry are mocking, jealousy, envy, stealing, ignoring, greed, cheating, debating and potential.  Pt stated some of her coping skills were encouraging others, teach others their strengths and positive affirmations.   Caroll RancherMarjette Naydelin Ziegler, LRT/CTRS          Caroll RancherLindsay, Daylyn Christine A 07/21/2017 11:34 AM

## 2017-07-21 NOTE — Progress Notes (Signed)
DAR NOTE: Patient presents with calm affect and depressed mood.  Denies pain, auditory and visual hallucinations.  Rates depression at 0, hopelessness at 0, and anxiety at 0.  Maintained on routine safety checks.  Medications given as prescribed.  Support and encouragement offered as needed.  Attended group and participated.  States goal for today is "discharge."  Patient is visible in the dayroom for group and activities.  Invega  Sustenance given.  No adverse reaction noted or reported.  Offered no complaint.

## 2017-07-21 NOTE — Progress Notes (Signed)
[facility]  07/21/2017 3:46 PM Monique Leydenansy P Shatswell  MRN:  [id number]  Subjective: [name] reports, "It is going really well today & I'm doing well. I suppose I'm going home tomorrow. I'm happy about that".  Objective: [name] is a 31 y/o F with history of schizoaffective disorder bipolar type who was admitted with worsening symptoms of agitation and psychosis. She was restarted on Invega oral formulation and dose was titrated up. She has shown incremental improvement of her presenting symptoms since being on the unit. Today 07-21-17, upon evaluation, she reports she is doing well overall. She reports that it is going really well today & that she is doing well. She adds that she suppose she is going home tomorrow & feels happy about that". She denies SI/HI/AH/VH. She is sleeping well and her appetite is good. She is tolerating her medications without difficulty or side effects. Started a trial of long-acting injectable Invega today.  We discussed that if pt continues to have improvement of her symptoms that we will anticipate discharge tomorrow morning, and pt is in agreement. She had no further questions, comments, or concerns. Staff continue to provide support. She does not appear to be in any distress.  Principal Problem: Schizoaffective disorder, bipolar type (HCC) Diagnosis:   Patient Active Problem List   Diagnosis Date Noted  . Schizophrenia, paranoid type (HCC) [F20.0] 07/14/2017  . Somatic dysfunction of pelvic region [M99.05] 10/26/2014  . Paranoia (HCC) [F22]   . Schizoaffective disorder, bipolar type (HCC) [F25.0] 03/11/2012   Total Time spent with patient: 15 minutes  Past Psychiatric History: See H&P  Past Medical History:  Past Medical History:  Diagnosis Date  . Bipolar 1 disorder (HCC) 2011  . Paranoia (HCC)   . Schizoaffective disorder (HCC)    History reviewed. No pertinent surgical history. Family History:  Family History  Problem Relation Age of Onset  . Cancer  Father        lymphoma  . Hypertension Mother   . Diabetes Neg Hx   . Heart disease Neg Hx    Family Psychiatric  History: See H&P Social History:  Social History   Substance and Sexual Activity  Alcohol Use No   Comment: Pt denies      Social History   Substance and Sexual Activity  Drug Use No   Comment: Pt denies    Social History   Socioeconomic History  . Marital status: Single    Spouse name: None  . Number of children: 0  . Years of education: some colle  . Highest education level: None  Social Needs  . Financial resource strain: None  . Food insecurity - worry: None  . Food insecurity - inability: None  . Transportation needs - medical: None  . Transportation needs - non-medical: None  Occupational History  . Occupation: [employer]     Comment: trying to start   Tobacco Use  . Smoking status: Never Smoker  . Smokeless tobacco: Never Used  Substance and Sexual Activity  . Alcohol use: No    Comment: Pt denies   . Drug use: No    Comment: Pt denies  . Sexual activity: No    Birth control/protection: Abstinence, None  Other Topics Concern  . None  Social History Narrative   Work or School: not working   Home Situation: lives with mother   Spiritual Beliefs: Christian   Lifestyle: no regular exercise; healthy diet         Additional  Social History:   Sleep: Good  Appetite:  Good  Current Medications: Current Facility-Administered Medications  Medication Dose Route Frequency Provider Last Rate Last Dose  . acetaminophen (TYLENOL) tablet 650 mg  650 mg Oral Q6H PRN Laveda AbbeParks, Laurie Britton, NP      . alum & mag hydroxide-simeth (MAALOX/MYLANTA) 200-200-20 MG/5ML suspension 30 mL  30 mL Oral Q4H PRN Laveda AbbeParks, Laurie Britton, NP      . benztropine (COGENTIN) tablet 0.5 mg  0.5 mg Oral BID PRN Micheal Likensainville, Christopher T, MD      . haloperidol (HALDOL) tablet 10 mg  10 mg Oral Q8H PRN Nira ConnBerry, Jason A, NP       Or  . haloperidol lactate (HALDOL) injection 10  mg  10 mg Intramuscular Q8H PRN Nira ConnBerry, Jason A, NP   10 mg at 07/18/17 0825  . hydrOXYzine (ATARAX/VISTARIL) tablet 25 mg  25 mg Oral TID PRN Laveda AbbeParks, Laurie Britton, NP      . LORazepam (ATIVAN) tablet 2 mg  2 mg Oral Q8H PRN Jackelyn PolingBerry, Jason A, NP       Or  . LORazepam (ATIVAN) injection 2 mg  2 mg Intramuscular Q8H PRN Nira ConnBerry, Jason A, NP   2 mg at 07/18/17 0825  . magnesium hydroxide (MILK OF MAGNESIA) suspension 30 mL  30 mL Oral Daily PRN Laveda AbbeParks, Laurie Britton, NP      . paliperidone (INVEGA) 24 hr tablet 6 mg  6 mg Oral Daily Micheal Likensainville, Christopher T, MD   6 mg at 07/21/17 0802  . paliperidone (INVEGA) 24 hr tablet 6 mg  6 mg Oral QPM Hisada, Barbee Cougheina, MD   6 mg at 07/20/17 1701  . traZODone (DESYREL) tablet 50 mg  50 mg Oral QHS PRN Laveda AbbeParks, Laurie Britton, NP       Lab Results:  No results found for this or any previous visit (from the past 48 hour(s)).  Blood Alcohol level:  Lab Results  Component Value Date   [name] <5 01/27/2015   ETH <11 06/12/2012   Metabolic Disorder Labs: Lab Results  Component Value Date   HGBA1C 5.9 07/27/2014   No results found for: PROLACTIN Lab Results  Component Value Date   CHOL 179 07/27/2014   TRIG 49 07/27/2014   HDL 44 07/27/2014   CHOLHDL 5 06/21/2014   VLDL 10 07/27/2014   LDLCALC 125 (H) 07/27/2014   LDLCALC 100 (H) 06/21/2014   Physical Findings: AIMS: Facial and Oral Movements Muscles of Facial Expression: None, normal Lips and Perioral Area: None, normal Jaw: None, normal Tongue: None, normal,Extremity Movements Upper (arms, wrists, hands, fingers): None, normal Lower (legs, knees, ankles, toes): None, normal, Trunk Movements Neck, shoulders, hips: None, normal, Overall Severity Severity of abnormal movements (highest score from questions above): None, normal Incapacitation due to abnormal movements: None, normal Patient's awareness of abnormal movements (rate only patient's report): No Awareness, Dental Status Current problems with  teeth and/or dentures?: No Does patient usually wear dentures?: No  CIWA:  CIWA-Ar Total: 1 COWS:  COWS Total Score: 2  Musculoskeletal: Strength & Muscle Tone: within normal limits Gait & Station: normal Patient leans: N/A  Psychiatric Specialty Exam: Physical Exam  Nursing note and vitals reviewed.   Review of Systems  Constitutional: Negative for chills and fever.  Respiratory: Negative for cough and shortness of breath.   Cardiovascular: Negative for chest pain.  Gastrointestinal: Negative for heartburn and nausea.  Psychiatric/Behavioral: Negative for depression, hallucinations and suicidal ideas. The patient is not nervous/anxious.  Blood pressure 110/62, pulse 84, temperature 98.1 F (36.7 C), temperature source Oral, resp. rate 18, height 5\' 3"  (1.6 m), weight 64.4 kg (142 lb).Body mass index is 25.15 kg/m.  General Appearance: Casual and Disheveled  Eye Contact:  Good  Speech:  Clear and Coherent and Normal Rate  Volume:  Normal  Mood:  Euthymic  Affect:  Congruent  Thought Process:  Disorganized and Goal Directed  Orientation:  Full (Time, Place, and Person)  Thought Content:  Hallucinations: Auditory  Suicidal Thoughts:  No  Homicidal Thoughts:  No  Memory:  Immediate;   Good Recent;   Good Remote;   Good  Judgement:  Fair  Insight:  Fair  Psychomotor Activity:  Normal  Concentration:  Concentration: Good  Recall:  Good  Fund of Knowledge:  Good  Language:  Good  Akathisia:  No  Handed:    AIMS (if indicated):     Assets:  Manufacturing systems engineer Physical Health Resilience Social Support  ADL's:  Intact  Cognition:  WNL  Sleep:  Number of Hours: 6.5   Treatment Plan Summary: Daily contact with patient to assess and evaluate symptoms and progress in treatment and Medication management. Pt demonstrates improvement of her disorganization and psychotic symptoms & reports that she is tolerating her medications well. She agrees to start [place]  starting today 07-21-17 and we will begin to formalize discharge planning.  - Continue inpatient hospitalization.  Will continue today 07/21/2017 plan as below except where it is noted.  - Schizoaffective disorder  - Continue Invega 6mg  po BID  - Start Hinda Glatter Sustenna 234mg  IM once on 07/21/17 - EPS  - Continue cogentin 0.5mg  BID prn EPS - anxiety  - Continue atarax 25mg  TID prn anxiety - insomnia  - Continue trazodone 50mg  qhs prn insomnia - Encourage participation in groups and the therapeutic milieu - Discharge planning will be ongoing   Sanjuana Kava, NP, PMHNP, FNP-BC 07/21/2017, 3:46 PMPatient ID: [name], female   DOB: 02-05-1986, 31 y.o.   MRN: [id number]

## 2017-07-21 NOTE — Progress Notes (Signed)
Patient ID: [name], female   DOB: 12/06/1985, 31 y.o.   MRN: [id number]  DAR Note Pt observed in the dayroom not interacting. Pt at the time of assessment denied any depression, anxiety, pain, SI, HI or AVH; "I feel better today." Pt offered support, encouragement, and safe environment. Pt remained calm and cooperative. 15-minute safety checks continue. Pt attended wrap-up group.

## 2017-07-22 MED ORDER — PALIPERIDONE PALMITATE 234 MG/1.5ML IM SUSP: 117.0000 mg | Freq: Once | INTRAMUSCULAR | Status: DC

## 2017-07-22 MED ORDER — PALIPERIDONE PALMITATE 234 MG/1.5ML IM SUSP: 117.0000 mg | INTRAMUSCULAR | Status: DC

## 2017-07-22 MED ORDER — PALIPERIDONE PALMITATE 234 MG/1.5ML IM SUSP: 117.0000 mg | Freq: Once | INTRAMUSCULAR | 0 refills | Status: DC

## 2017-07-22 MED ORDER — BENZTROPINE MESYLATE 0.5 MG PO TABS: 0.5000 mg | ORAL_TABLET | Freq: Two times a day (BID) | ORAL | 0 refills | Status: DC | PRN

## 2017-07-22 MED ORDER — PALIPERIDONE ER 6 MG PO TB24: ORAL_TABLET | ORAL | 0 refills | Status: DC

## 2017-07-22 MED ORDER — PALIPERIDONE PALMITATE 234 MG/1.5ML IM SUSP: 117.0000 mg | INTRAMUSCULAR | 0 refills | Status: DC

## 2017-07-22 MED ORDER — HYDROXYZINE HCL 25 MG PO TABS: 25.0000 mg | ORAL_TABLET | Freq: Three times a day (TID) | ORAL | 0 refills | Status: DC | PRN

## 2017-07-22 MED ORDER — TRAZODONE HCL 50 MG PO TABS: 50.0000 mg | ORAL_TABLET | Freq: Every evening | ORAL | 0 refills | Status: DC | PRN

## 2017-07-22 NOTE — Discharge Summary (Signed)
Physician Discharge Summary Note  Patient:  [name] is an 31 y.o., female  MRN:  [id number]  DOB:  1986/04/04  Patient phone:  [phone] (home)   Patient address:   [address] [place] [address],   Total Time spent with patient: Greater than 30 minutes  Date of Admission:  07/14/2017 Date of Discharge: 07-22-17  Reason for Admission: Worsening psychosis.  Principal Problem: Schizoaffective disorder, bipolar type [facility])  Discharge Diagnoses: Patient Active Problem List   Diagnosis Date Noted  . Schizophrenia, paranoid type (HCC) [F20.0] 07/14/2017  . Somatic dysfunction of pelvic region [M99.05] 10/26/2014  . Paranoia (HCC) [F22]   . Schizoaffective disorder, bipolar type (HCC) [F25.0] 03/11/2012   Past Psychiatric History: Schizophrenia  Past Medical History:  Past Medical History:  Diagnosis Date  . Bipolar 1 disorder (HCC) 2011  . Paranoia (HCC)   . Schizoaffective disorder (HCC)    History reviewed. No pertinent surgical history. Family History:  Family History  Problem Relation Age of Onset  . Cancer Father        lymphoma  . Hypertension Mother   . Diabetes Neg Hx   . Heart disease Neg Hx    Family Psychiatric  History: See H&P Social History:  Social History   Substance and Sexual Activity  Alcohol Use No   Comment: Pt denies      Social History   Substance and Sexual Activity  Drug Use No   Comment: Pt denies    Social History   Socioeconomic History  . Marital status: Single    Spouse name: None  . Number of children: 0  . Years of education: some colle  . Highest education level: None  Social Needs  . Financial resource strain: None  . Food insecurity - worry: None  . Food insecurity - inability: None  . Transportation needs - medical: None  . Transportation needs - non-medical: None  Occupational History  . Occupation: [name]     Comment: trying to start   Tobacco Use  . Smoking status: Never Smoker  .  Smokeless tobacco: Never Used  Substance and Sexual Activity  . Alcohol use: No    Comment: Pt denies   . Drug use: No    Comment: Pt denies  . Sexual activity: No    Birth control/protection: Abstinence, None  Other Topics Concern  . None  Social History Narrative   Work or School: not working   Home Situation: lives with mother   Spiritual Beliefs: Christian   Lifestyle: no regular exercise; healthy diet         Hospital Course: [name] is a 31 y/o F with history of schizoaffective disorder who was admitted with worsening symptoms of psychosis. Pt was brought in by police initially to [facility] where she presented with paranoia, disorganization, and recent history of poor medication adherence as an outpatient for several months. Upon interview, pt presents as generally disorganized, tangential, and bizarre. She is a poor historian overall, having difficulty providing a narrative of the events prior to hospitalization.  When asked why she came to the hospital, pt responds, "Sometimes my mom has a tendency to make me feel like she's angry." Pt was asked why her mother would be concerned enough that patient needed to go to the hospital, and pt replied tangentially, stated, "I don't know, there were people under the stairs." Pt appears to respond to internal stimuli throughout the interview and she replies to unseen individuals  in the room under her breath during the interview. She endorses AH of hearing the voice of her father whom has passed away. Later in the interview she states that she talks "to God" often. She endorses VH but declines to describe them. She denies SI and HI. She denies symptoms of depression, OCD, and PTSD. She reports that she has been sleeping well and her appetite is good. She denies all recent illicit substance use.  [name] was admitted to the [facility] adult unit for crisis management due to worsening symptoms of psychosis. Patient has hx of Bipolar affective disorder. She  presented on admission generally disorganized, psychotic, tangential & bizarre. She was a poor historian overall, having difficulty providing a narrative of the events prior to hospitalization.She was in need of mood stabilization treatments.   After evaluation of her presenting symptoms, [name] was started on the  medication regimen targeting those presenting symptoms. Their indications & side effects were explained to her although she seemed to have difficulties comprehending a lot of information at the time. She received & was discharged on; Cogentin 0.5 mg for EPS, Hydroxyzine 25 mg prn for anxiety, Invega sustenna 234 mg IM x 1, Invega Sustenna 117 mg IM x 1 due on 07-28-17 & Invega Sustenna 117 mg IM due on 08-28-17, Invega tablets 6 mg bid x 14 days, all for mood control & Trazodone 50 mg for insomnia. She presented no other significant pre-existing medical problems that required treatments. She tolerated her treatment regimen without any adverse effects or reactions reported.  During the course of her hosptalization, [name]'s improvement was monitored by observation & her daily report of gradual symptom reduction noted.  Her emotional & mental status were monitored by daily self-inventory reports completed by her & the clinical staff. She reported continued improvement on daily basis & denied any new concerns. She was enrolled & encouraged to attend group seesions to help with recognizing triggers of her emotional crises & ways to cope better with them.         [name] was evaluated by the treatment team on daily basis for mood stability and plans for continued recovery after discharge. She was offered further treatment options upon discharge on an outpatient basis as noted below. She was encouraged to maintain satisfactory support network and home environment as this will aid in maintaining mood stability. She was instructed & encouraged to adhere to her medication regimen as recommended by her treatment  team.    [name] was seen this morning by the attending psychiatrist. Her reason for admission, treatment plans & response to treatment discussed. She endorsed that she is doing well & ready to be discharged to continue mental health care on an outpatient basis as noted below. She was provided with all the necessary information needed to make this appointment without problems.   Upon discharge, [name] was both mentally and medically stable denying suicidal/homicidal ideation, auditory/visual/tactile hallucinations, delusional thoughts and paranoia. She [facility] with all personal belongings in no distress.  Transportation per mother.   Physical Findings: AIMS: Facial and Oral Movements Muscles of Facial Expression: None, normal Lips and Perioral Area: None, normal Jaw: None, normal Tongue: None, normal,Extremity Movements Upper (arms, wrists, hands, fingers): None, normal Lower (legs, knees, ankles, toes): None, normal, Trunk Movements Neck, shoulders, hips: None, normal, Overall Severity Severity of abnormal movements (highest score from questions above): None, normal Incapacitation due to abnormal movements: None, normal Patient's awareness of abnormal movements (rate only patient's report): No Awareness, Dental Status Current  problems with teeth and/or dentures?: No Does patient usually wear dentures?: No  CIWA:  CIWA-Ar Total: 1 COWS:  COWS Total Score: 2  Musculoskeletal: Strength & Muscle Tone: within normal limits Gait & Station: normal Patient leans: N/A  Psychiatric Specialty Exam: Physical Exam  Constitutional: She appears well-developed.  HENT:  Head: Normocephalic.  Eyes: Pupils are equal, round, and reactive to light.  Neck: Normal range of motion.  Cardiovascular: Normal rate.  Respiratory: Effort normal.  GI: Soft.  Genitourinary:  Genitourinary Comments: Deferred  Musculoskeletal: Normal range of motion.  Neurological: She is alert.  Skin: Skin is warm.     Review of Systems  Constitutional: Negative.   HENT: Negative.   Eyes: Negative.   Respiratory: Negative.   Cardiovascular: Negative.   Gastrointestinal: Negative.   Genitourinary: Negative.   Musculoskeletal: Negative.   Skin: Negative.   Neurological: Negative.   Endo/Heme/Allergies: Negative.   Psychiatric/Behavioral: Positive for depression (Stabilized with medication priro to discharge) and hallucinations (Hx. psychosis). Negative for memory loss, substance abuse and suicidal ideas. The patient has insomnia (Stabilized with medication prior to discharge). The patient is not nervous/anxious.     Blood pressure 110/66, pulse (!) 104, temperature 98.9 F (37.2 C), temperature source Oral, resp. rate 16, height 5\' 3"  (1.6 m), weight 64.4 kg (142 lb).Body mass index is 25.15 kg/m.  See Md's SRA   Have you used any form of tobacco in the last 30 days? (Cigarettes, Smokeless Tobacco, Cigars, and/or Pipes): No  Has this patient used any form of tobacco in the last 30 days? (Cigarettes, Smokeless Tobacco, Cigars, and/or Pipes):  N/A  Blood Alcohol level:  Lab Results  Component Value Date   [facility] <5 01/27/2015   ETH <11 06/12/2012   Metabolic Disorder Labs:  Lab Results  Component Value Date   HGBA1C 5.9 07/27/2014   No results found for: PROLACTIN Lab Results  Component Value Date   CHOL 179 07/27/2014   TRIG 49 07/27/2014   HDL 44 07/27/2014   CHOLHDL 5 06/21/2014   VLDL 10 07/27/2014   LDLCALC 125 (H) 07/27/2014   LDLCALC 100 (H) 06/21/2014   See Psychiatric Specialty Exam and Suicide Risk Assessment completed by Attending Physician prior to discharge.  Discharge destination:  Home  Is patient on multiple antipsychotic therapies at discharge:  No   Has Patient had three or more failed trials of antipsychotic monotherapy by history:  No  Recommended Plan for Multiple Antipsychotic Therapies: NA  Allergies as of 07/22/2017      Reactions   Quetiapine Other (See  Comments)   Facial skin irritation   Yes For Women [intimacy Products] Rash      Medication List    STOP taking these medications   Coconut Oil 1000 MG Caps   haloperidol 10 MG tablet Commonly known as:  HALDOL   hydrocortisone 2.5 % rectal cream Commonly known as:  ANUSOL-HC   multivitamin with minerals tablet   trimethoprim-polymyxin b ophthalmic solution Commonly known as:  POLYTRIM     TAKE these medications     Indication  benztropine 0.5 MG tablet Commonly known as:  COGENTIN Take 1 tablet (0.5 mg total) by mouth 2 (two) times daily as needed (EPS). What changed:  reasons to take this  Indication:  Extrapyramidal Reaction caused by Medications   hydrOXYzine 25 MG tablet Commonly known as:  ATARAX/VISTARIL Take 1 tablet (25 mg total) by mouth 3 (three) times daily as needed for anxiety.  Indication:  Feeling Anxious  paliperidone 234 MG/1.5ML Susp injection Commonly known as:  INVEGA SUSTENNA Inject 117 mg into the muscle once for 1 dose. (Due 07-28-17): For mood control Start taking on:  07/28/2017  Indication:  Mood control   paliperidone 234 MG/1.5ML Susp injection Commonly known as:  INVEGA SUSTENNA Inject 117 mg into the muscle every 30 (thirty) days. (Due on 08-28-17): For mood control Start taking on:  08/28/2017  Indication:  Mood control   paliperidone 6 MG 24 hr tablet Commonly known as:  INVEGA Take 1 tablet (6 mg) in the morning & 1 tablet (6 mg) at bedtime: For mood control  Indication:  Mood control   traZODone 50 MG tablet Commonly known as:  DESYREL Take 1 tablet (50 mg total) by mouth at bedtime as needed for sleep.  Indication:  Trouble Sleeping      Follow-up Information    [facility]/2017.   Why:  Wednesday at 1:00 for your hospital follow up appointment.  Call TitonkaJashella with [facility] for help with a ride, or for help with working with children  [phone] Contact information: [address] [place] [address] [phone]          Follow-up recommendations: Activity:  As tolerated Diet: As recommended by your primary care doctor. Keep all scheduled follow-up appointments as recommended.  Comments: Patient is instructed prior to discharge to: Take all medications as prescribed by his/her mental healthcare provider. Report any adverse effects and or reactions from the medicines to his/her outpatient provider promptly. Patient has been instructed & cautioned: To not engage in alcohol and or illegal drug use while on prescription medicines. In the event of worsening symptoms, patient is instructed to call the crisis hotline, 911 and or go to the nearest ED for appropriate evaluation and treatment of symptoms. To follow-up with his/her primary care provider for your other medical issues, concerns and or health care needs.   Signed: Sanjuana KavaNwoko, Agnes I, NP, PMHNP, FNP-BC 07/22/2017, 10:29 AM   Patient seen, Suicide Assessment Completed.  Disposition Plan Reviewed   [name] is a 31 y/o F with history of schizoaffective bipolar type who was admitted with worsening symptoms of agitation and psychosis. She was resumed on oral Invega and dose was titrated up, and yesterday she was transitioned to [place] Sustenna. Pt has been improving in terms of disorganization and agitation since on the unit. Today upon evaluation, she reports that she is doing "great." She had a new roommate placed in her room last night which caused her some anxiety, but otherwise she is doing well overall. She denies SI/HI/AH/VH. She is tolerating her medications without difficulty or side effects. She slept well despite sleeping part of the night in the day room due to her anxiety about her roommate. Her appetite is good. She is future oriented about outpatient follow up and going home to do Christmas decorating. She verbalized good understanding that she will need a booster [place]vega Sustenna injection at her  first appointment in about 1 week, and then she can be tapered off the oral invega. Pt was in agreement with this plan and she had no further questions, comments, or concerns. She was able to engage in safety planning including [facility] or contact emergency services if she feels unable to maintain her own safety or the safety of others.    Plan Of Care/Follow-up recommendations:  -Discharge to outpatient level of care  - Schizoaffective disorder -  Continue Invega 6mg  po BID (to be tapered off on outpatient basis) - Continue Hinda Glatternvega Sustenna, next due for Sustenna 117mg  IM once on 07/28/17 at outpatient follow up - EPS - Continue cogentin 0.5mg  BID prn EPS - anxiety - Continue atarax 25mg  TID prn anxiety - insomnia - Continue trazodone 50mg  qhs prn insomnia   Activity:  as tolerated Diet:  normal Tests:  NA Other:  see above for DC plan  Micheal Likenshristopher T Eshawn Coor, MD

## 2017-07-22 NOTE — Progress Notes (Signed)
Patient ID: [name], female   DOB: 07/25/1986, 31 y.o.   MRN: [id number]  Discharge Note- Belongings returned to patient at time of discharge. Discharge instructions and medications were reviewed with patient and patient's mother. Patient and her mother verbalized understanding of both medications and discharge instructions. Patient was discharged to lobby with her ride. Patient was discharged with no apparent distress. Q15 minute safety checks maintained until discharge.

## 2017-07-22 NOTE — Progress Notes (Addendum)
DAR Note  Pt observed in the dayroom not interacting. Pt at the time of assessment denied any depression, anxiety, pain, SI, HI or AVH; "I am going home tomorrow." Pt however was very restless and fearful after getting a roommate. Pt offered PRN medications but refused. Pt offered support, encouragement, and safe environment. Pt remained calm and cooperative. 15-minute safety checks continue. Pt did not attend wrap-up group.

## 2017-07-22 NOTE — Progress Notes (Signed)
Patient ID: Monique LeydenPansy P Dormer, female   DOB: 12/17/1985, 31 y.o.   MRN: [id number]  DAR: Pt. Denies SI/HI and A/V Hallucinations. She reports that she is looking forward to discharging soon. Patient does not report any pain or discomfort at this time. Patient slept in the quiet room voluntarily last night. Support and encouragement provided to the patient. Scheduled medication administered to patient this morning. Patient is apprehensive and minimal this morning with Clinical research associatewriter but cooperative. She is seen in the milieu and attending her morning recreational group. Q15 minute checks are maintained for safety.

## 2017-07-22 NOTE — Progress Notes (Signed)
  [facility] Discharge [facility] :  Will you be returning to the same living situation after discharge:  Yes,  home At discharge, do you have transportation home?: Yes,  mother Do you have the ability to pay for your medications: Yes,  MCD  Release of information consent forms completed and in the chart;  Patient's signature needed at discharge.  Patient to Follow up at: Follow-up Information    [facility] on 07/[facility]/2017.   Why:  Wednesday at 1:00 for your hospital follow up appointment.  Call MiltonaJashella with [facility] for help with a ride, or for help with working with children  [phone] Contact information: [address] [place] [address] [phone]           Next level of care provider has [facility] to [facility] Link:no  Safety Planning and Suicide Prevention discussed: Yes,  yes  Have you used any form of tobacco in the last 30 days? (Cigarettes, Smokeless Tobacco, Cigars, and/or Pipes): No  Has patient been referred to the Quitline?: N/A patient is not a smoker  Patient has been referred for addiction treatment: N/A  Ida RogueRodney B Butler Vegh, LCSW 07/22/2017, 9:17 AM

## 2017-07-22 NOTE — Progress Notes (Signed)
Recreation Therapy Notes  Date: 07/22/17 Time: 1000 Location: 500 Hall Dayroom  Group Topic: Communication, Team Building, Problem Solving  Goal Area(s) Addresses:  Patient will effectively work with peer towards shared goal.  Patient will identify skill used to make activity successful.  Patient will identify how skills used during activity can be used to reach post d/c goals.   Behavioral Response: Engaged  Intervention: STEM Activity   Activity: Glass blower/designeripe Cleaner Tower. In teams, patients were asked to build the tallest freestanding tower possible out of 15 pipe cleaners. Systematically resources were removed, for example patient ability to use both hands and patient ability to verbally communicate.    Education: Pharmacist, communityocial Skills, Building control surveyorDischarge Planning.   Education Outcome: Acknowledges education/In group clarification offered/Needs additional education.   Clinical Observations/Feedback: Pt was engaged and on task throughout group.  Pt stated her group had to develop a strong base in order for their tower to stand.  When one peer left with the doctor, pt continued and completed the tower on her own.   Caroll RancherMarjette Donnel Venuto, LRT/CTRS       Caroll RancherLindsay, Fabiha Rougeau A 07/22/2017 12:23 PM

## 2017-07-22 NOTE — Progress Notes (Signed)
Recreation Therapy Notes  INPATIENT RECREATION TR PLAN  Patient Details Name: [name] MRN: [id number] DOB: 05/13/1986 Today's Date: 07/22/2017  Rec Therapy Plan Is patient appropriate for Therapeutic Recreation?: Yes Treatment times per week: about 3 days Estimated Length of Stay: 5-7 days TR Treatment/Interventions: Group participation (Comment)  Discharge Criteria Pt will be discharged from therapy if:: Discharged Treatment plan/goals/alternatives discussed and agreed upon by:: Patient/family  Discharge Summary Short term goals set: Pt will demonstrate improved self esteem by identify at least 5 positive qualities at completion of recreation therapy sessions. Short term goals met: Adequate for discharge Progress toward goals comments: Groups attended Which groups?: Communication, Coping skills, Anger management, Other (Comment)(Self expression) Reason goals not met: None Therapeutic equipment acquired: N/A Reason patient discharged from therapy: Discharge from hospital Pt/family agrees with progress & goals achieved: Yes Date patient discharged from therapy: 07/22/17      , LRT/CTRS  ,  A 07/22/2017, 12:42 PM  

## 2017-07-22 NOTE — [facility] Suicide Risk Assessment (Signed)
[facility]   Principal Problem: Schizoaffective disorder, bipolar type [facility](HCC) Discharge Diagnoses:  Patient Active Problem List   Diagnosis Date Noted  . Schizophrenia, paranoid type (HCC) [F20.0] 07/14/2017  . Somatic dysfunction of pelvic region [M99.05] 10/26/2014  . Paranoia (HCC) [F22]   . Schizoaffective disorder, bipolar type (HCC) [F25.0] 03/11/2012    Total Time spent with patient: 30 minutes  Musculoskeletal: Strength & Muscle Tone: within normal limits Gait & Station: normal Patient leans: N/A  Psychiatric Specialty Exam: Review of Systems  Constitutional: Negative for chills and fever.  Respiratory: Negative for cough.   Cardiovascular: Negative for chest pain.  Gastrointestinal: Negative for heartburn.  Psychiatric/Behavioral: Negative for depression, hallucinations and suicidal ideas. The patient is not nervous/anxious.     Blood pressure 110/66, pulse (!) 104, temperature 98.9 F (37.2 C), temperature source Oral, resp. rate 16, height 5\' 3"  (1.6 m), weight 64.4 kg (142 lb).Body mass index is 25.15 kg/m.  General Appearance: Casual and Fairly Groomed  Eye Contact::  Good  Speech:  Clear and Coherent and Normal Rate  Volume:  Normal  Mood:  Euthymic  Affect:  Appropriate, Congruent and Flat  Thought Process:  Coherent and Goal Directed  Orientation:  Full (Time, Place, and Person)  Thought Content:  Logical  Suicidal Thoughts:  No  Homicidal Thoughts:  No  Memory:  Immediate;   Good Recent;   Good Remote;   Good  Judgement:  Fair  Insight:  Fair  Psychomotor Activity:  Normal  Concentration:  Good  Recall:  Good  Fund of Knowledge:Fair  Language: Good  Akathisia:  No  Handed:    AIMS (if indicated):     Assets:  Manufacturing systems engineerCommunication Skills Housing Leisure Time Physical Health Resilience Social Support  Sleep:  Number of Hours: 4.25  Cognition: WNL  ADL's:  Intact   Mental Status Per Nursing Assessment::   On Admission:      Demographic Factors:  Low socioeconomic status and Unemployed  Loss Factors: Financial problems/change in socioeconomic status  Historical Factors: Family history of mental illness or substance abuse and Impulsivity  Risk Reduction Factors:   Living with another person, especially a relative, Positive social support, Positive therapeutic relationship and Positive coping skills or problem solving skills  Continued Clinical Symptoms:  Schizophrenia:   Paranoid or undifferentiated type  Cognitive Features That Contribute To Risk:  None    Suicide Risk:  Minimal: No identifiable suicidal ideation.  Patients presenting with no risk factors but with morbid ruminations; may be classified as minimal risk based on the severity of the depressive symptoms  Follow-up Information    [facility] on 07[facility]29/2017.   Why:  Wednesday at 1:00 for your hospital follow up appointment.  Call [place] [facility] for help with a ride, or for help with working with children  [phone] Contact information: [address] [place] [address] [phone]         Subjective Data: [name] is a 31 y/o F with history of schizoaffective bipolar type who was admitted with worsening symptoms of agitation and psychosis. She was resumed on oral Invega and dose was titrated up, and yesterday she was transitioned to [place] Sustenna. Pt has been improving in terms of disorganization and agitation since on the unit. Today upon evaluation, she reports that she is doing "great." She had a new roommate placed in her room last night which caused her some anxiety, but otherwise she  is doing well overall. She denies SI/HI/AH/VH. She is tolerating her medications without difficulty or side effects. She slept well despite sleeping part of the night in the day room due to her anxiety about her roommate. Her appetite is good. She is future oriented about outpatient follow up and going  home to do Christmas decorating. She verbalized good understanding that she will need a booster [place]vega Sustenna injection at her first appointment in about 1 week, and then she can be tapered off the oral invega. Pt was in agreement with this plan and she had no further questions, comments, or concerns. She was able to engage in safety planning including plan to return to [facility] or contact emergency services if she feels unable to maintain her own safety or the safety of others.    Plan Of Care/Follow-up recommendations:  -Discharge to outpatient level of care  - Schizoaffective disorder             - Continue Invega 6mg  po BID (to be tapered off on outpatient basis)             - Continue Hinda Glatternvega Sustenna, next due for Sustenna 117mg  IM once on 07/28/17 at outpatient follow up - EPS             - Continue cogentin 0.5mg  BID prn EPS - anxiety             - Continue atarax 25mg  TID prn anxiety - insomnia             - Continue trazodone 50mg  qhs prn insomnia   Activity:  as tolerated Diet:  normal Tests:  NA Other:  see above for DC plan  Micheal Likenshristopher T Madissen Wyse, MD 07/22/2017, 10:02 AM

## 2017-07-22 NOTE — Tx Team (Signed)
Interdisciplinary Treatment and Diagnostic Plan Update  07/22/2017 Time of Session:1pm [name] MRN: [id number]  Principal Diagnosis: Schizoaffective disorder, bipolar type [facility](HCC)  Secondary Diagnoses: Principal Problem:   Schizoaffective disorder, bipolar type (HCC)   Current Medications:  Current Facility-Administered Medications  Medication Dose Route Frequency Provider Last Rate Last Dose  . acetaminophen (TYLENOL) tablet 650 mg  650 mg Oral Q6H PRN Laveda AbbeParks, Laurie Britton, NP      . alum & mag hydroxide-simeth (MAALOX/MYLANTA) 200-200-20 MG/5ML suspension 30 mL  30 mL Oral Q4H PRN Laveda AbbeParks, Laurie Britton, NP      . benztropine (COGENTIN) tablet 0.5 mg  0.5 mg Oral BID PRN Micheal Likensainville, Christopher T, MD      . haloperidol (HALDOL) tablet 10 mg  10 mg Oral Q8H PRN Nira ConnBerry, Jason A, NP       Or  . haloperidol lactate (HALDOL) injection 10 mg  10 mg Intramuscular Q8H PRN Nira ConnBerry, Jason A, NP   10 mg at 07/18/17 0825  . hydrOXYzine (ATARAX/VISTARIL) tablet 25 mg  25 mg Oral TID PRN Laveda AbbeParks, Laurie Britton, NP      . LORazepam (ATIVAN) tablet 2 mg  2 mg Oral Q8H PRN Jackelyn PolingBerry, Jason A, NP       Or  . LORazepam (ATIVAN) injection 2 mg  2 mg Intramuscular Q8H PRN Nira ConnBerry, Jason A, NP   2 mg at 07/18/17 0825  . magnesium hydroxide (MILK OF MAGNESIA) suspension 30 mL  30 mL Oral Daily PRN Laveda AbbeParks, Laurie Britton, NP      . paliperidone (INVEGA) 24 hr tablet 6 mg  6 mg Oral Daily Micheal Likensainville, Christopher T, MD   6 mg at 07/22/17 0758  . paliperidone (INVEGA) 24 hr tablet 6 mg  6 mg Oral QPM Neysa HotterHisada, Reina, MD   6 mg at 07/21/17 1852  . traZODone (DESYREL) tablet 50 mg  50 mg Oral QHS PRN Laveda AbbeParks, Laurie Britton, NP       PTA Medications: Medications Prior to Admission  Medication Sig Dispense Refill Last Dose  . benztropine (COGENTIN) 0.5 MG tablet Take 1 tablet (0.5 mg total) by mouth 2 (two) times daily as needed for tremors. (Patient not taking: Reported on 07/12/2017) 6 tablet 0 Not Taking at Unknown time   . Coconut Oil 1000 MG CAPS Take 2 capsules by mouth 2 (two) times daily.   Not Taking at Unknown time  . haloperidol (HALDOL) 10 MG tablet Take 1 tablet (10 mg total) by mouth at bedtime. (Patient not taking: Reported on 07/12/2017) 5 tablet 0 Not Taking at Unknown time  . hydrocortisone (ANUSOL-HC) 2.5 % rectal cream Apply rectally 2 times daily (Patient not taking: Reported on 07/12/2017) 28.35 g 1 Not Taking at Unknown time  . Multiple Vitamins-Minerals (MULTIVITAMIN WITH MINERALS) tablet Take 1 tablet daily by mouth.   Past Week at Unknown time  . trimethoprim-polymyxin b (POLYTRIM) ophthalmic solution Place 1 drop into the right eye 4 (four) times daily. For 5 days (Patient not taking: Reported on 05/21/2016) 10 mL 0 Not Taking at Unknown time    Patient Stressors: Medication change or noncompliance  Patient Strengths: Ability for insight Average or above average intelligence Communication skills General fund of knowledge Supportive family/friends  Treatment Modalities: Medication Management, Group therapy, Case management,  1 to 1 session with clinician, Psychoeducation, Recreational therapy.   Physician Treatment Plan for Primary Diagnosis: Schizoaffective disorder, bipolar type (HCC) Long Term Goal(s): Improvement in symptoms so as ready for discharge Improvement in symptoms so as  ready for discharge   Short Term Goals: Ability to identify and develop effective coping behaviors will improve Compliance with prescribed medications will improve  Medication Management: Evaluate patient's response, side effects, and tolerance of medication regimen.  Therapeutic Interventions: 1 to 1 sessions, Unit Group sessions and Medication administration.  Evaluation of Outcomes: Adequate for Discharge  Physician Treatment Plan for Secondary Diagnosis: Principal Problem:   Schizoaffective disorder, bipolar type (HCC)  Long Term Goal(s): Improvement in symptoms so as ready for  discharge Improvement in symptoms so as ready for discharge   Short Term Goals: Ability to identify and develop effective coping behaviors will improve Compliance with prescribed medications will improve     Medication Management: Evaluate patient's response, side effects, and tolerance of medication regimen.  Therapeutic Interventions: 1 to 1 sessions, Unit Group sessions and Medication administration.  Evaluation of Outcomes: Adequate for Discharge   RN Treatment Plan for Primary Diagnosis: Schizoaffective disorder, bipolar type (HCC) Long Term Goal(s): Knowledge of disease and therapeutic regimen to maintain health will improve  Short Term Goals: Compliance with prescribed medications will improve  Medication Management: RN will administer medications as ordered by provider, will assess and evaluate patient's response and provide education to patient for prescribed medication. RN will report any adverse and/or side effects to prescribing provider.  Therapeutic Interventions: 1 on 1 counseling sessions, Psychoeducation, Medication administration, Evaluate responses to treatment, Monitor vital signs and CBGs as ordered, Perform/monitor CIWA, COWS, AIMS and Fall Risk screenings as ordered, Perform wound care treatments as ordered.  Evaluation of Outcomes: Adequate for Discharge   LCSW Treatment Plan for Primary Diagnosis: Schizoaffective disorder, bipolar type (HCC) Long Term Goal(s): Safe transition to appropriate next level of care at discharge, Engage patient in therapeutic group addressing interpersonal concerns.  Short Term Goals: Engage patient in aftercare planning with referrals and resources, Facilitate acceptance of mental health diagnosis and concerns and Identify triggers associated with mental health/substance abuse issues  Therapeutic Interventions: Assess for all discharge needs, 1 to 1 time with Social worker, Explore available resources and support systems, Assess for  adequacy in community support network, Educate family and significant other(s) on suicide prevention, Complete Psychosocial Assessment, Interpersonal group therapy.  Evaluation of Outcomes: Adequate for Discharge   Progress in Treatment: Attending groups: Yes. Participating in groups: Yes. Taking medication as prescribed: Yes. Toleration medication: Yes. Family/Significant other contact made: Yes, individual(s) contacted:  Mother Patient understands diagnosis: Yes. Discussing patient identified problems/goals with staff: Yes. Medical problems stabilized or resolved: Yes Denies suicidal/homicidal ideation: Yes. Issues/concerns per patient self-inventory: No. Other: New problem(s) identified: Yes, Describe:  Patient has concerns about mixing her medications with other meications.   New Short Term/Long Term Goal(s): "I know that I need to take my medication".   Discharge Plan or Barriers: Plans to return to her mother and engage in outpatient treatment.   Reason for Continuation of Hospitalization:   Estimated Length of Stay: D/C today  Attendees: Patient: 07/22/2017 9:15 AM  Physician: Jolyne Loahristopher [place], MD 07/22/2017 9:15 AM  Nursing: Maryjo RochesterShalita RN 07/22/2017 9:15 AM  RN Care Manager: 07/22/2017 9:15 AM  Social Worker: Johny Shearsassandra Jarrett, LCSWA 07/22/2017 9:15 AM  Recreational Therapist:  07/22/2017 9:15 AM  Other:  07/22/2017 9:15 AM  Other:  07/22/2017 9:15 AM  Other: 07/22/2017 9:15 AM    Scribe for Treatment Team: Ida Rogueodney B Diannah Rindfleisch, LCSW 07/22/2017 9:15 AM

## 2017-10-31 ENCOUNTER — Other Ambulatory Visit: Payer: Self-pay

## 2017-10-31 ENCOUNTER — Emergency Department (HOSPITAL_COMMUNITY)
Admission: EM | Admit: 2017-10-31 | Discharge: 2017-11-02 | Disposition: A | Discharge status: Other Acute Inpatient Hospital | Payer: Medicaid Other | Attending: Emergency Medicine | Admitting: Emergency Medicine

## 2017-10-31 ENCOUNTER — Encounter (HOSPITAL_COMMUNITY): Payer: Self-pay | Admitting: Internal Medicine

## 2017-10-31 DIAGNOSIS — R443 Hallucinations, unspecified: Secondary | ICD-10-CM

## 2017-10-31 DIAGNOSIS — F250 Schizoaffective disorder, bipolar type: Secondary | ICD-10-CM

## 2017-10-31 DIAGNOSIS — F200 Paranoid schizophrenia: Secondary | ICD-10-CM | POA: Insufficient documentation

## 2017-10-31 DIAGNOSIS — Z79899 Other long term (current) drug therapy: Secondary | ICD-10-CM | POA: Insufficient documentation

## 2017-10-31 DIAGNOSIS — F22 Delusional disorders: Secondary | ICD-10-CM | POA: Insufficient documentation

## 2017-10-31 DIAGNOSIS — F319 Bipolar disorder, unspecified: Secondary | ICD-10-CM | POA: Insufficient documentation

## 2017-10-31 LAB — CBC WITH DIFFERENTIAL/PLATELET
BASOS ABS: 0.1 10*3/uL (ref 0.0–0.1)
BASOS PCT: 1 %
Eosinophils Absolute: 0.1 10*3/uL (ref 0.0–0.7)
Eosinophils Relative: 2 %
HEMATOCRIT: 40.9 % (ref 36.0–46.0)
HEMOGLOBIN: 13.9 g/dL (ref 12.0–15.0)
Lymphocytes Relative: 36 %
Lymphs Abs: 2.1 10*3/uL (ref 0.7–4.0)
MCH: 27.4 pg (ref 26.0–34.0)
MCHC: 34 g/dL (ref 30.0–36.0)
MCV: 80.7 fL (ref 78.0–100.0)
MONOS PCT: 5 %
Monocytes Absolute: 0.3 10*3/uL (ref 0.1–1.0)
NEUTROS ABS: 3.2 10*3/uL (ref 1.7–7.7)
NEUTROS PCT: 56 %
Platelets: 267 10*3/uL (ref 150–400)
RBC: 5.07 MIL/uL (ref 3.87–5.11)
RDW: 14 % (ref 11.5–15.5)
WBC: 5.7 10*3/uL (ref 4.0–10.5)

## 2017-10-31 LAB — COMPREHENSIVE METABOLIC PANEL
ALK PHOS: 49 U/L (ref 38–126)
ALT: 13 U/L — AB (ref 14–54)
AST: 19 U/L (ref 15–41)
Albumin: 4.5 g/dL (ref 3.5–5.0)
Anion gap: 11 (ref 5–15)
BUN: 6 mg/dL (ref 6–20)
CO2: 24 mmol/L (ref 22–32)
CREATININE: 0.76 mg/dL (ref 0.44–1.00)
Calcium: 9.2 mg/dL (ref 8.9–10.3)
Chloride: 103 mmol/L (ref 101–111)
GFR calc non Af Amer: >60 mL/min (ref >60)
Glucose, Bld: 90 mg/dL (ref 65–99)
Potassium: 3.8 mmol/L (ref 3.5–5.1)
SODIUM: 138 mmol/L (ref 135–145)
Total Bilirubin: 0.5 mg/dL (ref 0.3–1.2)
Total Protein: 8 g/dL (ref 6.5–8.1)

## 2017-10-31 LAB — I-STAT BETA HCG BLOOD, ED (MC, WL, AP ONLY)

## 2017-10-31 LAB — RAPID URINE DRUG SCREEN, HOSP PERFORMED
Amphetamines: NOT DETECTED
Barbiturates: NOT DETECTED
Benzodiazepines: NOT DETECTED
Cocaine: NOT DETECTED
OPIATES: NOT DETECTED
TETRAHYDROCANNABINOL: NOT DETECTED

## 2017-10-31 LAB — ACETAMINOPHEN LEVEL

## 2017-10-31 LAB — SALICYLATE LEVEL

## 2017-10-31 LAB — ETHANOL: Alcohol, Ethyl (B): <10 mg/dL (ref <10)

## 2017-10-31 MED ORDER — BENZTROPINE MESYLATE 0.5 MG PO TABS: 0.5000 mg | ORAL_TABLET | Freq: Two times a day (BID) | ORAL | Status: DC | PRN

## 2017-10-31 MED ORDER — HYDROXYZINE HCL 25 MG PO TABS: 25.0000 mg | ORAL_TABLET | Freq: Three times a day (TID) | ORAL | Status: DC | PRN

## 2017-10-31 MED ORDER — PALIPERIDONE ER 6 MG PO TB24
6.0000 mg | ORAL_TABLET | Freq: Every day | ORAL | Status: DC
  Filled 2017-10-31 (×2): qty 1

## 2017-10-31 MED ORDER — LORAZEPAM 1 MG PO TABS
1.0000 mg | ORAL_TABLET | Freq: Once | ORAL | Status: AC
  Administered 2017-10-31: 1 mg via ORAL
  Filled 2017-10-31: qty 1

## 2017-10-31 MED ORDER — LORAZEPAM 2 MG/ML IJ SOLN: 1.0000 mg | Freq: Once | INTRAMUSCULAR | Status: DC

## 2017-10-31 MED ORDER — TRAZODONE HCL 50 MG PO TABS: 50.0000 mg | ORAL_TABLET | Freq: Every evening | ORAL | Status: DC | PRN

## 2017-10-31 NOTE — ED Notes (Signed)
Patient denies SI/HI/AVH at this time. Plan of care discussed. Encouragement and support provided and safety maintain. Q 15 min safety checks remain in place and video monitoring. 

## 2017-10-31 NOTE — ED Triage Notes (Addendum)
Pt arrived voluntarily by [employer]. Per patient's mother, patient was "out of it" this morning. Her mother stated that the patient often times starts laughing for no apparent reason and that the patient sometimes "talks to herself." Pt was recently diagnosed with bipolar disorder, but has not taken her bipolar medications (trazodone and hydroxyzine) for 3 months.

## 2017-10-31 NOTE — ED Provider Notes (Signed)
[facility]-EMERGENCY DEPT Provider Note   CSN: [id number] Arrival date & time: 10/31/17  0816     History   Chief Complaint Chief Complaint  Patient presents with  . Psychiatric Evaluation    HPI [name] is a 32 y.o. female with a history of schizoaffective disorder, paranoia, and bipolar 1 disorder who presents to the ED by [employer].   The patient's mother reports that she seemed "out of it" this morning.  She states the patient has a history of laughing out loud for no apparent reason and talking to herself sometimes.  She reports that she has not taken her home trazodone or hydroxyzine for 3 months.  [employer] reports the patient was calm and cooperative in route to the ED.   Upon my arrival, the patient is agitated and anxious-appearing.  She refuses to answer if she is SI or HI. She denies auditory or visual hallucinations, but then appears to be responding to internal stimuli. She is refusing to change out of her home clothes. She whispers quietly with her hand placed over her mouth when speaking. She refers to "the trauma" several times, but does not elaborate.   Level 5 caveat secondary to psychiatric disorder.  The history is provided by the patient. No language interpreter was used.    Past Medical History:  Diagnosis Date  . Bipolar 1 disorder (HCC) 2011  . Paranoia (HCC)   . Schizoaffective disorder [name](HCC)     Patient Active Problem List   Diagnosis Date Noted  . Schizophrenia, paranoid type (HCC) 07/14/2017  . Somatic dysfunction of pelvic region 10/26/2014  . Paranoia (HCC)   . Schizoaffective disorder, bipolar type (HCC) 03/11/2012    History reviewed. No pertinent surgical history.  OB History    No data available       Home Medications    Prior to Admission medications   Medication Sig Start Date End Date Taking? Authorizing Provider  benztropine (COGENTIN) 0.5 MG tablet Take 1 tablet (0.5 mg total) by mouth 2 (two) times daily as  needed (EPS). 07/22/17   Armandina StammerNwoko, Agnes I, NP  hydrOXYzine (ATARAX/VISTARIL) 25 MG tablet Take 1 tablet (25 mg total) by mouth 3 (three) times daily as needed for anxiety. 07/22/17   Armandina StammerNwoko, Agnes I, NP  paliperidone (INVEGA SUSTENNA) 234 MG/1.5ML SUSP injection Inject 117 mg into the muscle once for 1 dose. (Due 07-28-17): For mood control Patient not taking: Reported on 10/31/2017 07/28/17 07/28/17  Armandina StammerNwoko, Agnes I, NP  paliperidone (INVEGA SUSTENNA) 234 MG/1.5ML SUSP injection Inject 117 mg into the muscle every 30 (thirty) days. (Due on 08-28-17): For mood control 08/28/17   Armandina StammerNwoko, Agnes I, NP  paliperidone (INVEGA) 6 MG 24 hr tablet Take 1 tablet (6 mg) in the morning & 1 tablet (6 mg) at bedtime: For mood control Patient not taking: Reported on 10/31/2017 07/22/17   Armandina StammerNwoko, Agnes I, NP  traZODone (DESYREL) 50 MG tablet Take 1 tablet (50 mg total) by mouth at bedtime as needed for sleep. 07/22/17   Sanjuana KavaNwoko, Agnes I, NP    Family History Family History  Problem Relation Age of Onset  . Cancer Father        lymphoma  . Hypertension Mother   . Diabetes Neg Hx   . Heart disease Neg Hx     Social History Social History   Tobacco Use  . Smoking status: Never Smoker  . Smokeless tobacco: Never Used  Substance Use Topics  . Alcohol use:  No    Comment: Pt denies   . Drug use: No    Comment: Pt denies     Allergies   Quetiapine and Yes for women [intimacy products]   Review of Systems Review of Systems  Unable to perform ROS: Psychiatric disorder  Psychiatric/Behavioral: Positive for agitation and hallucinations. The patient is nervous/anxious.    Physical Exam Updated Vital Signs BP 130/68 (BP Location: Left Arm)   Pulse (!) 58   Temp 99.3 F (37.4 C) (Oral)   Resp 17   SpO2 96%   Physical Exam  Constitutional: No distress.  HENT:  Head: Normocephalic.  Eyes: Conjunctivae are normal.  Neck: Neck supple.  Cardiovascular: Normal rate and regular rhythm. Exam reveals no gallop and  no friction rub.  No murmur heard. Pulmonary/Chest: Effort normal. No respiratory distress.  Airway is patent.   Abdominal: Soft. She exhibits no distension.  Musculoskeletal:  Moves all 4 extremities.   Neurological: She is alert.  Skin: Skin is warm. No rash noted.  Psychiatric: Her mood appears anxious. Her affect is labile and inappropriate. Her speech is tangential. She is agitated and actively hallucinating. Thought content is paranoid.  Thoughts are disorganized.  She is inattentive.  Nursing note and vitals reviewed.  ED Treatments / Results  Labs (all labs ordered are listed, but only abnormal results are displayed) Labs Reviewed  COMPREHENSIVE METABOLIC PANEL - Abnormal; Notable for the following components:      Result Value   ALT 13 (*)    All other components within normal limits  ACETAMINOPHEN LEVEL - Abnormal; Notable for the following components:   Acetaminophen (Tylenol), Serum <10 (*)    All other components within normal limits  ETHANOL  RAPID URINE DRUG SCREEN, HOSP PERFORMED  CBC WITH DIFFERENTIAL/PLATELET  SALICYLATE LEVEL  I-STAT BETA HCG BLOOD, ED ([facility], [facility], [facility] ONLY)    EKG  EKG Interpretation None       Radiology No results found.  Procedures Procedures (including critical care time)  Medications Ordered in ED Medications  LORazepam (ATIVAN) injection 1 mg (0 mg Intramuscular Hold 10/31/17 0934)  LORazepam (ATIVAN) tablet 1 mg (1 mg Oral Given 10/31/17 0934)     Initial Impression / Assessment and Plan / ED Course  I have reviewed the triage vital signs and the nursing notes.  Pertinent labs & imaging results that were available during my care of the patient were reviewed by me and considered in my medical decision making (see chart for details).     32 year old female with a history of schizoaffective disorder, paranoia, and bipolar 1 disorder who presents to the ED by [employer]. Patient has been IVC'ed by her mother. Agitated on arrival,  improved after PO Ativan. She seems to be responding to internal stimuli and thoughts are disorganized. Pt medically cleared at this time. Psych hold orders and home med orders placed. TTS consult pending; please see psych team notes for further documentation of care/dispo. Pt stable at time of med clearance.    Final Clinical Impressions(s) / ED Diagnoses   Final diagnoses:  Paranoia [facility])  Hallucinations    ED Discharge Orders    None       Barkley Boards, PA-C 10/31/17 1413    Jacalyn Lefevre, MD 10/31/17 1435

## 2017-10-31 NOTE — BH Assessment (Signed)
BHH Assessment Progress Note  Case was staffed with Lord DNP who recommended a inpatient admission as appropriate bed placement is investigated.         

## 2017-10-31 NOTE — ED Notes (Signed)
Nurse practitioner, Nanine MeansJamison Lord made aware that patient was refusing po meds.

## 2017-10-31 NOTE — ED Notes (Signed)
Will wand and transport patient once she is done eating lunch.
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
## 2017-10-31 NOTE — ED Notes (Signed)
Patient is not cooperating with hospital staff to change in to paper scrubs. Patient's mom and ED provider at the bedside.

## 2017-11-01 ENCOUNTER — Encounter (HOSPITAL_COMMUNITY): Payer: Self-pay | Admitting: Emergency Medicine

## 2017-11-01 DIAGNOSIS — F250 Schizoaffective disorder, bipolar type: Secondary | ICD-10-CM

## 2017-11-01 MED ORDER — OLANZAPINE 10 MG IM SOLR
10.0000 mg | Freq: Two times a day (BID) | INTRAMUSCULAR | Status: DC
  Administered 2017-11-01 – 2017-11-02 (×3): 10 mg via INTRAMUSCULAR
  Filled 2017-11-01 (×3): qty 10

## 2017-11-01 MED ORDER — DIPHENHYDRAMINE HCL 50 MG/ML IJ SOLN
50.0000 mg | Freq: Two times a day (BID) | INTRAMUSCULAR | Status: DC
  Administered 2017-11-01 – 2017-11-02 (×3): 50 mg via INTRAMUSCULAR
  Filled 2017-11-01 (×3): qty 1

## 2017-11-01 MED ORDER — STERILE WATER FOR INJECTION IJ SOLN
INTRAMUSCULAR | Status: AC
  Administered 2017-11-01: 14:00:00
  Filled 2017-11-01: qty 10

## 2017-11-01 MED ORDER — HYDROXYZINE HCL 25 MG PO TABS
25.0000 mg | ORAL_TABLET | Freq: Two times a day (BID) | ORAL | Status: DC
  Administered 2017-11-01: 25 mg via ORAL
  Filled 2017-11-01: qty 1

## 2017-11-01 NOTE — BH Assessment (Signed)
[facility]      [facility] and can be admitted later today, no time was specified.

## 2017-11-01 NOTE — ED Notes (Signed)
Patient is disorganized with flight of ideas.  Refusing medication.

## 2017-11-01 NOTE — ED Notes (Signed)
Report med refusal to provider.

## 2017-11-01 NOTE — ED Notes (Signed)
Took vistaril.  Continues to refuse Invega.

## 2017-11-01 NOTE — BH Assessment (Signed)
[facility]     Dr. Jannifer FranklinAkintayo [facility]

## 2017-11-01 NOTE — ED Notes (Signed)
Report to NP that patient is still refusing po medications which may impede transfer to inpatient. Report to Dole FoodCaroline RN.

## 2017-11-01 NOTE — ED Notes (Signed)
Continues to refuse Invega.

## 2017-11-01 NOTE — ED Notes (Signed)
Patient seen awake watching TV in her room. Appear startled as this Clinical research associatewriter called her name. Patient started glancing the room as if she was searching for something and then started talking to her self. Patient's response to this writer was irrelevant to the question asked. Patient could not engage in the conversation at this time. Will continue to monitor patient.

## 2017-11-01 NOTE — ED Notes (Addendum)
Received a call from [place], [facility]  Patient is to be held here due to acuity of 500 hall.  Called [facility] to cancel transportation.  Informed them patient may be transferred later if [facility] changes [facility].  Notified Haig ProphetJamie L., NP of information. Patient has been calm and cooperative since her injection earlier.  She has been lying quietly in bed.  Patient has been refusing medications throughout the day.  Earlier, she received an injection of benedryl and zyprexa IM with good results.
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
## 2017-11-02 ENCOUNTER — Other Ambulatory Visit: Payer: Self-pay

## 2017-11-02 ENCOUNTER — Encounter (HOSPITAL_COMMUNITY): Payer: Self-pay

## 2017-11-02 ENCOUNTER — Inpatient Hospital Stay (HOSPITAL_COMMUNITY)
Admission: AD | Admit: 2017-11-02 | Discharge: 2017-11-10 | DRG: 885 | Disposition: A | Discharge status: Home / Self Care | Payer: Medicaid Other | Attending: Psychiatry | Admitting: Psychiatry

## 2017-11-02 DIAGNOSIS — Z807 Family history of other malignant neoplasms of lymphoid, hematopoietic and related tissues: Secondary | ICD-10-CM | POA: Diagnosis not present

## 2017-11-02 DIAGNOSIS — Z9114 Patient's other noncompliance with medication regimen: Secondary | ICD-10-CM | POA: Diagnosis not present

## 2017-11-02 DIAGNOSIS — R4587 Impulsiveness: Secondary | ICD-10-CM | POA: Diagnosis not present

## 2017-11-02 DIAGNOSIS — Z8249 Family history of ischemic heart disease and other diseases of the circulatory system: Secondary | ICD-10-CM | POA: Diagnosis not present

## 2017-11-02 DIAGNOSIS — G4700 Insomnia, unspecified: Secondary | ICD-10-CM | POA: Diagnosis present

## 2017-11-02 DIAGNOSIS — Z79899 Other long term (current) drug therapy: Secondary | ICD-10-CM | POA: Diagnosis not present

## 2017-11-02 DIAGNOSIS — G259 Extrapyramidal and movement disorder, unspecified: Secondary | ICD-10-CM | POA: Diagnosis present

## 2017-11-02 DIAGNOSIS — R451 Restlessness and agitation: Secondary | ICD-10-CM | POA: Diagnosis present

## 2017-11-02 DIAGNOSIS — F250 Schizoaffective disorder, bipolar type: Principal | ICD-10-CM | POA: Diagnosis present

## 2017-11-02 DIAGNOSIS — F419 Anxiety disorder, unspecified: Secondary | ICD-10-CM | POA: Diagnosis present

## 2017-11-02 DIAGNOSIS — Z888 Allergy status to other drugs, medicaments and biological substances status: Secondary | ICD-10-CM

## 2017-11-02 DIAGNOSIS — R450 Nervousness: Secondary | ICD-10-CM | POA: Diagnosis not present

## 2017-11-02 LAB — HEMOGLOBIN A1C
HEMOGLOBIN A1C: 5.5 % (ref 4.8–5.6)
MEAN PLASMA GLUCOSE: 111.15 mg/dL

## 2017-11-02 MED ORDER — ALUM & MAG HYDROXIDE-SIMETH 200-200-20 MG/5ML PO SUSP: 30.0000 mL | ORAL | Status: DC | PRN

## 2017-11-02 MED ORDER — PALIPERIDONE PALMITATE 234 MG/1.5ML IM SUSP: 117.0000 mg | INTRAMUSCULAR | Status: DC

## 2017-11-02 MED ORDER — HYDROXYZINE HCL 25 MG PO TABS
25.0000 mg | ORAL_TABLET | Freq: Three times a day (TID) | ORAL | Status: DC | PRN
  Administered 2017-11-03: 25 mg via ORAL
  Filled 2017-11-02: qty 1

## 2017-11-02 MED ORDER — DIPHENHYDRAMINE HCL 50 MG/ML IJ SOLN
50.0000 mg | Freq: Two times a day (BID) | INTRAMUSCULAR | Status: DC
  Filled 2017-11-02 (×4): qty 1

## 2017-11-02 MED ORDER — PALIPERIDONE ER 3 MG PO TB24
3.0000 mg | ORAL_TABLET | Freq: Two times a day (BID) | ORAL | Status: DC
  Filled 2017-11-02 (×6): qty 1

## 2017-11-02 MED ORDER — ACETAMINOPHEN 325 MG PO TABS: 650.0000 mg | ORAL_TABLET | Freq: Four times a day (QID) | ORAL | Status: DC | PRN

## 2017-11-02 MED ORDER — HALOPERIDOL 5 MG PO TABS
5.0000 mg | ORAL_TABLET | Freq: Four times a day (QID) | ORAL | Status: DC | PRN
Start: 2017-11-02 — End: 2017-11-10

## 2017-11-02 MED ORDER — MAGNESIUM HYDROXIDE 400 MG/5ML PO SUSP: 30.0000 mL | Freq: Every day | ORAL | Status: DC | PRN

## 2017-11-02 MED ORDER — STERILE WATER FOR INJECTION IJ SOLN
INTRAMUSCULAR | Status: AC
  Administered 2017-11-02: 09:00:00
  Filled 2017-11-02: qty 10

## 2017-11-02 MED ORDER — DIPHENHYDRAMINE HCL 50 MG/ML IJ SOLN: 50.0000 mg | Freq: Four times a day (QID) | INTRAMUSCULAR | Status: DC | PRN

## 2017-11-02 MED ORDER — OLANZAPINE 10 MG IM SOLR: 10.0000 mg | Freq: Four times a day (QID) | INTRAMUSCULAR | Status: DC | PRN

## 2017-11-02 MED ORDER — TRAZODONE HCL 50 MG PO TABS
50.0000 mg | ORAL_TABLET | Freq: Every evening | ORAL | Status: DC | PRN
  Administered 2017-11-03 – 2017-11-04 (×2): 50 mg via ORAL
  Filled 2017-11-02 (×2): qty 1

## 2017-11-02 MED ORDER — TRAZODONE HCL 50 MG PO TABS
50.0000 mg | ORAL_TABLET | Freq: Every evening | ORAL | Status: DC | PRN
Start: 2017-11-02 — End: 2017-11-02

## 2017-11-02 MED ORDER — BENZTROPINE MESYLATE 0.5 MG PO TABS
0.5000 mg | ORAL_TABLET | Freq: Two times a day (BID) | ORAL | Status: DC | PRN
  Filled 2017-11-02: qty 1

## 2017-11-02 MED ORDER — OLANZAPINE 10 MG IM SOLR
10.0000 mg | Freq: Two times a day (BID) | INTRAMUSCULAR | Status: DC
  Filled 2017-11-02 (×4): qty 10

## 2017-11-02 MED ORDER — HALOPERIDOL LACTATE 5 MG/ML IJ SOLN: 5.0000 mg | Freq: Four times a day (QID) | INTRAMUSCULAR | Status: DC | PRN

## 2017-11-02 MED ORDER — HALOPERIDOL 5 MG PO TABS
5.0000 mg | ORAL_TABLET | Freq: Two times a day (BID) | ORAL | Status: DC
Start: 2017-11-02 — End: 2017-11-05
  Administered 2017-11-03 – 2017-11-05 (×4): 5 mg via ORAL
  Filled 2017-11-02 (×12): qty 1

## 2017-11-02 NOTE — [facility] Group Notes (Signed)
LCSW Group Therapy Note   11/02/2017 1:15pm   Type of Therapy and Topic:  Group Therapy:  Overcoming Obstacles   Participation Level:  Minimal   Description of Group:    In this group patients will be encouraged to explore what they see as obstacles to their own wellness and recovery. They will be guided to discuss their thoughts, feelings, and behaviors related to these obstacles. The group will process together ways to cope with barriers, with attention given to specific choices patients can make. Each patient will be challenged to identify changes they are motivated to make in order to overcome their obstacles. This group will be process-oriented, with patients participating in exploration of their own experiences as well as giving and receiving support and challenge from other group members.   Therapeutic Goals: 1. Patient will identify personal and current obstacles as they relate to admission. 2. Patient will identify barriers that currently interfere with their wellness or overcoming obstacles.  3. Patient will identify feelings, thought process and behaviors related to these barriers. 4. Patient will identify two changes they are willing to make to overcome these obstacles:      Summary of Patient Progress  Stayed the entire time, minimal interaction.  Stated her biggest challenge is her niece and nephew who are young adults.  They don't live with her, but they come by.  Unable to share what about them causes her difficulties.    Therapeutic Modalities:   Cognitive Behavioral Therapy Solution Focused Therapy Motivational Interviewing Relapse Prevention Therapy  Monique RogueRodney B Ladona Rosten, LCSW 11/02/2017 2:19 PM

## 2017-11-02 NOTE — ED Notes (Signed)
[facility]. Personal property given to [employer] for transfer. Ambulatory off unit in police custody.
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
## 2017-11-02 NOTE — Progress Notes (Signed)
Patient described her day as having been "very interesting". She did not go into further detail except to add that her family visit went well. Her goal for tomorrow is to be more patient.

## 2017-11-02 NOTE — [facility] Suicide Risk Assessment (Signed)
[facility] Admission Suicide Risk Assessment   Nursing information obtained from:  Patient Demographic factors:  Adolescent or young adult Current Mental Status:  NA Loss Factors:  NA Historical Factors:  NA Risk Reduction Factors:  Living with another person, especially a relative  Total Time spent with patient: 1 hour Principal Problem: Schizoaffective disorder, bipolar type (HCC) Diagnosis:   Patient Active Problem List   Diagnosis Date Noted  . Schizophrenia, paranoid type (HCC) [F20.0] 07/14/2017  . Somatic dysfunction of pelvic region [M99.05] 10/26/2014  . Paranoia (HCC) [F22]   . Schizoaffective disorder, bipolar type (HCC) [F25.0] 03/11/2012   Subjective Data:   [name] is a 32 y/o F with history of schizoaffective disorder bipolar type who was admitted on IVC after she presented to the ED with worsening psychosis. As per pt's mother, pt had been disorganized, non-adherent to her home medication regimen, paranoid, and responding to internal stimuli. Pt refused to be resumed on Invega in the ED, and she was given IM zyprexa.  Upon evaluation, pt is a poor historian. She is tangential and disorganized in her responses. She responds to internal stimuli throughout the interview. When asked why she came to the hospital, pt responds, "Something... Minor little things." Pt was asked to be more specific and she replied, "Something is wrong with me - It's not like I'm paranoid or nothing. That's weird, this is a weird moment. I know there's truth in me." Pt denies SI/HI/AH/VH. She denies illicit substance use. She denies symptoms of depression, mania, OCD, and PTSD. She is unable to provide a narrative of the events prior to coming to the hospital, and she is unable to identify any specific stressors in her life.  Discussed with patient about treatment options. She confirms that she has not been taking any medications outside the hospital. Discussed with patient that recommendation is for her  to attempt a long-acting injectable medication, and first choice would be to restart Invega (with plan to transition to [place]) as she has established tolerability of that medication. Pt declines to start Invega explaining that it causes her mouth "to feel funny." Discussed with patient about trial of haldol, and she was in agreement to start scheduled oral form of haldol.   Continued Clinical Symptoms:  Alcohol Use Disorder Identification Test Final Score (AUDIT): 0 The "Alcohol Use Disorders Identification Test", Guidelines for Use in Primary Care, Second Edition.  World Science writer [facility]). Score between 0-7:  no or low risk or alcohol related problems. Score between 8-15:  moderate risk of alcohol related problems. Score between 16-19:  high risk of alcohol related problems. Score 20 or above:  warrants further diagnostic evaluation for alcohol dependence and treatment.   CLINICAL FACTORS:   Severe Anxiety and/or Agitation Schizophrenia:   Paranoid or undifferentiated type   Musculoskeletal: Strength & Muscle Tone: within normal limits Gait & Station: normal Patient leans: N/A  Psychiatric Specialty Exam: Physical Exam  Nursing note and vitals reviewed.   Review of Systems  Constitutional: Negative for chills and fever.  Respiratory: Negative for cough and shortness of breath.   Cardiovascular: Negative for chest pain.  Gastrointestinal: Negative for abdominal pain, heartburn, nausea and vomiting.  Psychiatric/Behavioral: Positive for hallucinations. Negative for depression and suicidal ideas. The patient is nervous/anxious.     Blood pressure 113/74, pulse (!) 102, temperature 98.5 F (36.9 C), temperature source Oral, resp. rate (P) 18, height 5\' 4"  (1.626 m), weight 58.1 kg (128 lb), SpO2 97 %.Body mass index is 21.97  kg/m.  General Appearance: Disheveled  Eye Contact:  Minimal  Speech:  Garbled and Slurred  Volume:  Decreased  Mood:  Anxious  Affect:  Blunt and  Flat  Thought Process:  Disorganized and Descriptions of Associations: Tangential  Orientation:  Full (Time, Place, and Person)  Thought Content:  Illogical and Hallucinations: Auditory  Suicidal Thoughts:  No  Homicidal Thoughts:  No  Memory:  Immediate;   Fair Recent;   Poor Remote;   Poor  Judgement:  Poor  Insight:  Lacking  Psychomotor Activity:  Normal  Concentration:  Concentration: Poor  Recall:  Poor  Fund of Knowledge:  Poor  Language:  Fair  Akathisia:  No  Handed:    AIMS (if indicated):     Assets:  Resilience  ADL's:  Intact  Cognition:  WNL  Sleep:         COGNITIVE FEATURES THAT CONTRIBUTE TO RISK:  None    SUICIDE RISK:   Mild:  Suicidal ideation of limited frequency, intensity, duration, and specificity.  There are no identifiable plans, no associated intent, mild dysphoria and related symptoms, good self-control (both objective and subjective assessment), few other risk factors, and identifiable protective factors, including available and accessible social support.  PLAN OF CARE:   - Admit to inpatient level of care  - Schizoaffective disorder   - Start Haldol 5mg  po BID  -EPS   - Start cogentin 0.5mg  po BID prn EPS  - Anxiety   - Start atarax 25mg  po TID prn anxiety  - Insomnia   - start trazodone 50mg  po qhs prn insomnia  - Agitation    - Start haldol 5mg  po/IM q6h prn agitation    - Start benadryl 50mg  IM q6h prn severe agitation  - Encourage participation in groups and therapeutic milieu  -Discharge planning will be ongoing  I certify that inpatient services furnished can reasonably be expected to improve the patient's condition.   Micheal Likenshristopher T Darrian Grzelak, MD 11/02/2017, 4:34 PM

## 2017-11-02 NOTE — Progress Notes (Signed)
Admission Note: Patient is a [age] year old female admitted to the unit for bizarre behaviors.  Patient presents with delusional, disorganized, and tangential thoughts process.  Patient unable to response to assessment questions.  Patient states, "mom and half sister frightens her."  Appears to be responding internally.  Observed talking to herself and not making eye contact during assessment.  Appearance is unkempt and malodorous.  Admission plan of care reviewed and consent signed.  Skin assessment completed.  Personal belonging searched.  No contraband found.   Patient oriented to the unit, staff and room.  Routine safety checks initiated.  Patient is safe on the unit.  Patient encouraged and assisted with showering and change of clothing.

## 2017-11-02 NOTE — Progress Notes (Addendum)
Nursing Progress Note: 7p-7a D: Pt currently presents with a animated/averitve/childlike/fidgety/apprehensive affect and behavior. Pt states "I Had a good day. My goal is to stay positive." Interacting minimally with the milieu. Pt reports good sleep during the previous night with current medication regimen. Pt did attend wrap-up group.  A: Pt refused medications per providers orders. Pt's labs and vitals were monitored throughout the night. Pt supported emotionally and encouraged to express concerns and questions. Pt educated on medications.  R: Pt's safety ensured with 15 minute and environmental checks. Pt currently denies SI and HI and endorses AVH. Pt verbally contracts to seek staff if SI,HI, or AVH occurs and to consult with staff before acting on any harmful thoughts. Will continue to monitor.
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
## 2017-11-02 NOTE — Tx Team (Signed)
Initial Treatment Plan 11/02/2017 2:09 PM [name] [id number]    PATIENT STRESSORS: Financial difficulties Medication change or noncompliance   PATIENT STRENGTHS: Average or above average intelligence Communication skills Supportive family/friends   PATIENT IDENTIFIED PROBLEMS: Altered mental status  Anxiety  Medication Noncompliance                 DISCHARGE CRITERIA:  Adequate post-discharge living arrangements Improved stabilization in mood, thinking, and/or behavior Safe-care adequate arrangements made  PRELIMINARY DISCHARGE PLAN: Attend aftercare/continuing care group Outpatient therapy Return to previous living arrangement  PATIENT/FAMILY INVOLVEMENT: This treatment plan has been presented to and reviewed with the patient, [name], and/or family member.  The patient and family have been given the opportunity to ask questions and make suggestions.  Clarene CritchleyElizabeth O Khristie Sak, RN 11/02/2017, 2:09 PM

## 2017-11-03 NOTE — Progress Notes (Signed)
Recreation Therapy Notes  Date: 3.12.19 Time: 10:00 a.m. Location: [address] Dayroom   Group Topic: Self-Esteem   Goal Area(s) Addresses:  Goal 1.1: To increase self-esteem  - Group will improve mood through participation during Recreation Therapy tx.  - Group will identify at least two positive traits by the end of therapy session.   - Group will identify the importance of self-esteem  Behavioral Response: Appropriate   Intervention: Craft/Art    Activity: Positive Affirmation Banner: Patients had the opportunity to color in a positive affirmation banner of their choice using the color pencils and markers provided.    Education: Self-Esteem   Education Outcome: Acknowledges Education  Clinical Observations/Feedback: Patient was engaged during group activity appropriately participating during Recreation Therapy group tx. Patient was able to identify a positive trait stating "the way she was before she had problems". Patient actively listened during introduction discussion and participated during closing discussion. Patient adequately met Goal 1.1 (see above).   Ranell Patrick, Recreation Therapy Intern   Ranell Patrick 11/03/2017 11:28 AM

## 2017-11-03 NOTE — Progress Notes (Signed)
Recreation Therapy Notes  INPATIENT RECREATION THERAPY ASSESSMENT  Patient Details Name: [name] MRN: [id number] DOB: 06/06/1986 Today's Date: 11/03/2017       Information Obtained From: Patient  Able to Participate in Assessment/Interview: Yes  Patient Presentation: Responsive, Confused  Reason for Admission (Per Patient): Patient reports being admitted into the hospital because her mother brought her here   Patient Stressors: Family  Coping Skills:   Write, TV, Music, Art  Leisure Interests (2+):  Community - Technical brewerMovies  Frequency of Recreation/Participation: Chief Executive OfficerMonthly  Awareness of Community Resources:  Yes  Community Resources:  [employer], Research scientist (physical sciences)Movie Theaters, Tree surgeonMall  Current Use: Yes  Expressed Interest in State Street CorporationCommunity Resource Information: Yes  [place]   Patient Main Form of Transportation: Set designerCar  Patient Strengths:  Patient was not able to identify   Patient Identified Areas of Improvement:  Improve on patience   Patient Goal for Hospitalization:  Never come back again   Current SI (including self-harm):  No  Current HI:  No  Current AVH: No  Staff Intervention Plan: Collaborate with Interdisciplinary Treatment Team, Group Attendance  Consent to Intern Participation: Yes  Sheryle Hailarian Calob B[place]ette, Recreation Therapy Intern   Sheryle HailDarian Larinda Herter 11/03/2017, 2:21 PM

## 2017-11-03 NOTE — Progress Notes (Signed)
[facility] MD Progress Note  11/03/2017 3:33 PM [name]  MRN:  [id number] Subjective:    [name] is a 32 y/o F with history of schizoaffective disorder bipolar type who was admitted on IVC after she presented to the ED with worsening psychosis. As per pt's mother, pt had been disorganized, non-adherent to her home medication regimen, paranoid, and responding to internal stimuli. Pt refused to be resumed on Invega in the ED, and she was given IM zyprexa while in the ED. Upon initial presentation, pt continued to refuse Invega, and so she was started on haldol oral form with plan to transition to long-acting injectable form.  Today upon evaluation, pt remains disorganized, pressured, and internally preoccupied. Several times during the interview, pt will begin conversing with herself or possible unseen others. Pt is generally a poor historian, and she provides multiple vague responses. Pt was asked how she is doing today, and she replied, "I feel a bit more energized. I want to go home and make payments on certain items." Pt then began to make several tangential statements about shopping for clothing, but she was able to be redirected. She denies any specific concerns today. She denies any physical complaints. She denies SI/HI/AH/VH. She is sleeping well and her appetite is good. She is tolerating her current medications without difficulty or side effects. Discussed with patient about importance of transitioning to a long-acting injectable medication, and pt was initially hesitant, but with explanation of benefits of long-acting injectable form of medication, she was in agreement to continue with plan to transition from haldol oral form to haldol decanoate, but she will need a few more doses to establish tolerability and efficacy. Pt was in agreement with the above plan, and she had no further questions, comments, or concerns.  Principal Problem: Schizoaffective disorder, bipolar type (HCC) Diagnosis:    Patient Active Problem List   Diagnosis Date Noted  . Schizophrenia, paranoid type (HCC) [F20.0] 07/14/2017  . Somatic dysfunction of pelvic region [M99.05] 10/26/2014  . Paranoia (HCC) [F22]   . Schizoaffective disorder, bipolar type (HCC) [F25.0] 03/11/2012   Total Time spent with patient: 30 minutes  Past Psychiatric History: see H&P  Past Medical History:  Past Medical History:  Diagnosis Date  . Bipolar 1 disorder (HCC) 2011  . Paranoia (HCC)   . Schizoaffective disorder (HCC)    History reviewed. No pertinent surgical history. Family History:  Family History  Problem Relation Age of Onset  . Cancer Father        lymphoma  . Hypertension Mother   . Diabetes Neg Hx   . Heart disease Neg Hx    Family Psychiatric  History: see H&P Social History:  Social History   Substance and Sexual Activity  Alcohol Use No   Comment: Pt denies      Social History   Substance and Sexual Activity  Drug Use No   Comment: Pt denies    Social History   Socioeconomic History  . Marital status: Single    Spouse name: None  . Number of children: 0  . Years of education: some colle  . Highest education level: None  Social Needs  . Financial resource strain: None  . Food insecurity - worry: None  . Food insecurity - inability: None  . Transportation needs - medical: None  . Transportation needs - non-medical: None  Occupational History  . Occupation: [name]     Comment: trying to start   Tobacco Use  .  Smoking status: Never Smoker  . Smokeless tobacco: Never Used  Substance and Sexual Activity  . Alcohol use: No    Comment: Pt denies   . Drug use: No    Comment: Pt denies  . Sexual activity: No    Birth control/protection: Abstinence, None  Other Topics Concern  . None  Social History Narrative   Work or School: not working   Home Situation: lives with mother   Spiritual Beliefs: Christian   Lifestyle: no regular exercise; healthy diet         Additional  Social History:                         Sleep: Fair  Appetite:  Good  Current Medications: Current Facility-Administered Medications  Medication Dose Route Frequency Provider Last Rate Last Dose  . acetaminophen (TYLENOL) tablet 650 mg  650 mg Oral Q6H PRN Charm RingsLord, Jamison Y, NP      . alum & mag hydroxide-simeth (MAALOX/MYLANTA) 200-200-20 MG/5ML suspension 30 mL  30 mL Oral Q4H PRN Charm RingsLord, Jamison Y, NP      . benztropine (COGENTIN) tablet 0.5 mg  0.5 mg Oral BID PRN Armandina StammerNwoko, Agnes I, NP      . diphenhydrAMINE (BENADRYL) injection 50 mg  50 mg Intramuscular Q6H PRN Micheal Likensainville, Sky Primo T, MD      . haloperidol (HALDOL) tablet 5 mg  5 mg Oral BID Micheal Likensainville, Kordell Jafri T, MD   5 mg at 11/03/17 0827  . haloperidol (HALDOL) tablet 5 mg  5 mg Oral Q6H PRN Micheal Likensainville, Eureka Valdes T, MD       Or  . haloperidol lactate (HALDOL) injection 5 mg  5 mg Intramuscular Q6H PRN Micheal Likensainville, Kateland Leisinger T, MD      . hydrOXYzine (ATARAX/VISTARIL) tablet 25 mg  25 mg Oral TID PRN Armandina StammerNwoko, Agnes I, NP   25 mg at 11/03/17 0827  . magnesium hydroxide (MILK OF MAGNESIA) suspension 30 mL  30 mL Oral Daily PRN Charm RingsLord, Jamison Y, NP      . traZODone (DESYREL) tablet 50 mg  50 mg Oral QHS PRN Charm RingsLord, Jamison Y, NP        Lab Results:  Results for orders placed or performed during the hospital encounter of 11/02/17 (from the past 48 hour(s))  Hemoglobin A1c     Status: None   Collection Time: 11/02/17  6:41 PM  Result Value Ref Range   Hgb A1c MFr Bld 5.5 4.8 - 5.6 %    Comment: (NOTE) Pre diabetes:          5.7%-6.4% Diabetes:              >6.4% Glycemic control for   <7.0% adults with diabetes    Mean Plasma Glucose 111.15 mg/dL    Comment: Performed at [facility], [address] [place], [place] [address]    Blood Alcohol level:  Lab Results  Component Value Date   [facility]   ETH <5 01/27/2015    Metabolic Disorder Labs: Lab Results  Component Value Date   HGBA1C 5.5  11/02/2017   MPG 111.15 11/02/2017   No results found for: PROLACTIN Lab Results  Component Value Date   CHOL 179 07/27/2014   TRIG 49 07/27/2014   HDL 44 07/27/2014   CHOLHDL 5 06/21/2014   VLDL 10 07/27/2014   LDLCALC 125 (H) 07/27/2014   LDLCALC 100 (H) 06/21/2014    Physical Findings: AIMS: Facial and Oral  Movements Muscles of Facial Expression: None, normal Lips and Perioral Area: None, normal Jaw: None, normal Tongue: None, normal,Extremity Movements Upper (arms, wrists, hands, fingers): None, normal Lower (legs, knees, ankles, toes): None, normal, Trunk Movements Neck, shoulders, hips: None, normal, Overall Severity Severity of abnormal movements (highest score from questions above): None, normal Incapacitation due to abnormal movements: None, normal Patient's awareness of abnormal movements (rate only patient's report): No Awareness, Dental Status Current problems with teeth and/or dentures?: No Does patient usually wear dentures?: No  CIWA:    COWS:     Musculoskeletal: Strength & Muscle Tone: within normal limits Gait & Station: normal Patient leans: N/A  Psychiatric Specialty Exam: Physical Exam  Nursing note and vitals reviewed.   Review of Systems  Constitutional: Negative for chills and fever.  Respiratory: Negative for cough and shortness of breath.   Cardiovascular: Negative for chest pain.  Gastrointestinal: Negative for abdominal pain, heartburn, nausea and vomiting.  Psychiatric/Behavioral: Positive for hallucinations. Negative for depression and suicidal ideas. The patient is not nervous/anxious and does not have insomnia.     Blood pressure 113/74, pulse (!) 102, temperature 98.5 F (36.9 C), temperature source Oral, resp. rate 18, height 5\' 4"  (1.626 m), weight 58.1 kg (128 lb), SpO2 97 %.Body mass index is 21.97 kg/m.  General Appearance: Disheveled  Eye Contact:  Fair  Speech:  Clear and Coherent and Normal Rate  Volume:  Normal  Mood:   Euthymic  Affect:  Congruent and Flat  Thought Process:  Coherent, Goal Directed and Descriptions of Associations: Tangential  Orientation:  Full (Time, Place, and Person)  Thought Content:  Hallucinations: Auditory  Suicidal Thoughts:  No  Homicidal Thoughts:  No  Memory:  Immediate;   Fair Recent;   Fair Remote;   Fair  Judgement:  Fair  Insight:  Fair  Psychomotor Activity:  Normal  Concentration:  Concentration: Fair  Recall:  [employer] of Knowledge:  Fair  Language:  Fair  Akathisia:  No  Handed:    AIMS (if indicated):     Assets:  Resilience  ADL's:  Intact  Cognition:  WNL  Sleep:  Number of Hours: 4    Treatment Plan Summary: Daily contact with patient to assess and evaluate symptoms and progress in treatment and Medication management    - Continue inpatient hospitalization  - Schizoaffective disorder             - Continue Haldol 5mg  po BID  -EPS             - Continue cogentin 0.5mg  po BID prn EPS  - Anxiety              - Continue atarax 25mg  po TID prn anxiety  - Insomnia              -Continue trazodone 50mg  po qhs prn insomnia  - Agitation                     - Continue haldol 5mg  po/IM q6h prn agitation             - Continue benadryl 50mg  IM q6h prn severe agitation  - Encourage participation in groups and therapeutic milieu  -Discharge planning will be ongoing  Micheal Likens, MD 11/03/2017, 3:33 PM

## 2017-11-03 NOTE — BHH Group Notes (Signed)
LCSW Group Therapy 11/03/2017 1:15pm  Type of Therapy and Topic:  Group Therapy:  Change and Accountability  Participation Level:  Minimal  Description of Group In this group, patients discussed power and accountability for change.  The group identified the challenges related to accountability and the difficulty of accepting the outcomes of negative behaviors.  Patients were encouraged to openly discuss a challenge/change they could take responsibility for.  Patients discussed the use of "change talk" and positive thinking as ways to support achievement of personal goals.  The group discussed ways to give support and empowerment to peers.  Therapeutic Goals: 1. Patients will state the relationship between personal power and accountability in the change process 2. Patients will identify the positive and negative consequences of a personal choice they have made 3. Patients will identify one challenge/choice they will take responsibility for making 4. Patients will discuss the role of "change talk" and the impact of positive thinking as it supports successful personal change 5. Patients will verbalize support and affirmation of change efforts in peers  Summary of Patient Progress:  Stayed the entire time, appeared to be engaged.  When she contributed, it was repeating what someone else had recently shared.  Appears disorganized.  Therapeutic Modalities Solution Focused Brief Therapy Motivational Interviewing Cognitive Behavioral Therapy  Monique [name], [place] 11/03/2017 1:33 PM

## 2017-11-03 NOTE — Progress Notes (Signed)
D: Pt presents irritable, argumentative and defensive on interactions (labile); verbal outburst at the beginning of this shift.  A & O to self and place. Denies SI, HI, AVH and pain "I said NO, go away". However, pt was observed to be preoccupied, responding to internal stimuli (talking to self /unseen others) during interactions. Required multiple verbal redirections to comply with medications. Refused medications on initial approach "no, I'm not taking it, I need the doctor now, doctor, doctor" yelling at nursing station this morning. Pt has been resistive to care, speech is pressured and disorganized. Pt attended CSW group and participated minimally per note.   A: Emotional support and encouragement provided to pt. Scheduled and PRN medications given as ordered with verbal education and effects monitored. Safety checks maintained at Q 15 minutes intervals without self harm gestures. R: Pt tolerates all PO intake well. Gait is shuffle, no fall event to note at this time. Went off unit for meals, returned without issues.POC continues for safety and mood stability.

## 2017-11-04 DIAGNOSIS — R4587 Impulsiveness: Secondary | ICD-10-CM

## 2017-11-04 MED ORDER — BENZTROPINE MESYLATE 1 MG PO TABS
1.0000 mg | ORAL_TABLET | Freq: Every day | ORAL | Status: DC
  Administered 2017-11-04 – 2017-11-09 (×2): 1 mg via ORAL
  Filled 2017-11-04 (×9): qty 1

## 2017-11-04 NOTE — Progress Notes (Signed)
Adult Psychoeducational Group Note  Date:  11/04/2017 Time:  8:45 PM  Group Topic/Focus:  Wrap-Up Group:   The focus of this group is to help patients review their daily goal of treatment and discuss progress on daily workbooks.  Participation Level:  Active  Participation Quality:  Appropriate  Affect:  Appropriate  Cognitive:  Appropriate  Insight: Appropriate  Engagement in Group:  Engaged  Modes of Intervention:  Discussion  Additional Comments:  The patient expressed that she rates today a 10.The patient also said she went to groups.  Octavio Mannshigpen, Maicol Bowland Lee 11/04/2017, 8:45 PM

## 2017-11-04 NOTE — Progress Notes (Signed)
Patient ID: Monique LeydenPansy P Schram, female   DOB: 10/08/1985, 32 y.o.   MRN: [id number]  D: Patient alert and cooperative on the unit. Pt does not interact with peers but rather  stays in her room. Pt attended and engaged in evening wrap up group. Pt denies A/VH but does appear to be responding to internal stimuli. Pt denies  SI/HI and pain.No behavioral issues noted.  A: Support and encouragement offered as needed to express needs. Medications administered as prescribed.  R: Patient is safe on the unit. Will continue to monitor  for safety and stability.

## 2017-11-04 NOTE — Progress Notes (Signed)
[facility] MD Progress Note  11/04/2017 4:09 PM [name]  MRN:  [id number]  Subjective: [name] reports, "I'm feeling great. I'm doing okay on the medicines. I just do not want to be over sedated during the day. I sleep well. The Dr. Catalina Pizza me that I will be going home tomorrow. I have been attending group sessions".  [name] is a 31 y/o F with history of schizoaffective disorder bipolar type who was admitted on IVC after she presented to the ED with worsening psychosis. As per pt's mother, pt had been disorganized, non-adherent to her home medication regimen, paranoid, and responding to internal stimuli. Pt refused to be resumed on Invega in the ED, and she was given IM zyprexa while in the ED. Upon initial presentation, pt continued to refuse Invega, and so she was started on haldol oral form with plan to transition to long-acting injectable form.  Today 11-04-17, upon evaluation, pt presents more clearer today, alert, oriented & verbally responsive. She says she is doing well on her medications, however, does not want to feel overly sedated during the day time. We have combined her haldol 5 mg bid to a single dose of 10 mg po Q hs as well as her prn cogentin 0.5 mg bid is now 1 mg po Q hs. She denies any physical complaints. She denies SI/HI/AH/VH. She is sleeping well and her appetite is good. She is tolerating her current medications without difficulty or side effects. The attending psychiatrist discussed with patient yesterday about importance of transitioning to a long-acting injectable medication, and pt was initially hesitant, but with explanation of benefits of long-acting injectable form of medication, she was in agreement to continue with plan to transition from haldol oral form to haldol decanoate, but she will need a few more doses to establish tolerability and efficacy. Pt was in agreement with the above plan, and she had no further questions, comments, or concerns. She does not appear to be in  any apparent distress.  Principal Problem: Schizoaffective disorder, bipolar type (HCC) Diagnosis:   Patient Active Problem List   Diagnosis Date Noted  . Schizophrenia, paranoid type (HCC) [F20.0] 07/14/2017    Priority: High  . Somatic dysfunction of pelvic region [M99.05] 10/26/2014  . Paranoia (HCC) [F22]   . Schizoaffective disorder, bipolar type (HCC) [F25.0] 03/11/2012   Total Time spent with patient: 15 minutes  Past Psychiatric History: See H&P  Past Medical History:  Past Medical History:  Diagnosis Date  . Bipolar 1 disorder (HCC) 2011  . Paranoia (HCC)   . Schizoaffective disorder (HCC)    History reviewed. No pertinent surgical history.  Family History:  Family History  Problem Relation Age of Onset  . Cancer Father        lymphoma  . Hypertension Mother   . Diabetes Neg Hx   . Heart disease Neg Hx    Family Psychiatric  History: See H&P Social History:  Social History   Substance and Sexual Activity  Alcohol Use No   Comment: Pt denies      Social History   Substance and Sexual Activity  Drug Use No   Comment: Pt denies    Social History   Socioeconomic History  . Marital status: Single    Spouse name: None  . Number of children: 0  . Years of education: some colle  . Highest education level: None  Social Needs  . Financial resource strain: None  . Food insecurity - worry: None  .  Food insecurity - inability: None  . Transportation needs - medical: None  . Transportation needs - non-medical: None  Occupational History  . Occupation: [name]     Comment: trying to start   Tobacco Use  . Smoking status: Never Smoker  . Smokeless tobacco: Never Used  Substance and Sexual Activity  . Alcohol use: No    Comment: Pt denies   . Drug use: No    Comment: Pt denies  . Sexual activity: No    Birth control/protection: Abstinence, None  Other Topics Concern  . None  Social History Narrative   Work or School: not working   Home Situation:  lives with mother   Spiritual Beliefs: Christian   Lifestyle: no regular exercise; healthy diet         Additional Social History:   Sleep: Fair  Appetite:  Good  Current Medications: Current Facility-Administered Medications  Medication Dose Route Frequency Provider Last Rate Last Dose  . acetaminophen (TYLENOL) tablet 650 mg  650 mg Oral Q6H PRN Charm Rings, NP      . alum & mag hydroxide-simeth (MAALOX/MYLANTA) 200-200-20 MG/5ML suspension 30 mL  30 mL Oral Q4H PRN Charm Rings, NP      . benztropine (COGENTIN) tablet 1 mg  1 mg Oral QHS Tamika Shropshire I, NP      . diphenhydrAMINE (BENADRYL) injection 50 mg  50 mg Intramuscular Q6H PRN Micheal Likens, MD      . haloperidol (HALDOL) tablet 5 mg  5 mg Oral BID Micheal Likens, MD   5 mg at 11/03/17 2129  . haloperidol (HALDOL) tablet 5 mg  5 mg Oral Q6H PRN Micheal Likens, MD       Or  . haloperidol lactate (HALDOL) injection 5 mg  5 mg Intramuscular Q6H PRN Micheal Likens, MD      . hydrOXYzine (ATARAX/VISTARIL) tablet 25 mg  25 mg Oral TID PRN Armandina Stammer I, NP   25 mg at 11/03/17 0827  . magnesium hydroxide (MILK OF MAGNESIA) suspension 30 mL  30 mL Oral Daily PRN Charm Rings, NP      . traZODone (DESYREL) tablet 50 mg  50 mg Oral QHS PRN Charm Rings, NP   50 mg at 11/03/17 2129   Lab Results:  Results for orders placed or performed during the hospital encounter of 11/02/17 (from the past 48 hour(s))  Hemoglobin A1c     Status: None   Collection Time: 11/02/17  6:41 PM  Result Value Ref Range   Hgb A1c MFr Bld 5.5 4.8 - 5.6 %    Comment: (NOTE) Pre diabetes:          5.7%-6.4% Diabetes:              >6.4% Glycemic control for   <7.0% adults with diabetes    Mean Plasma Glucose 111.15 mg/dL    Comment: Performed at [facility], [address], [place], [place] [address]   Blood Alcohol level:  Lab Results  Component Value Date   [facility] <10 10/31/2017   ETH <5  01/27/2015    Metabolic Disorder Labs: Lab Results  Component Value Date   HGBA1C 5.5 11/02/2017   MPG 111.15 11/02/2017   No results found for: PROLACTIN Lab Results  Component Value Date   CHOL 179 07/27/2014   TRIG 49 07/27/2014   HDL 44 07/27/2014   CHOLHDL 5 06/21/2014   VLDL 10 07/27/2014  LDLCALC 125 (H) 07/27/2014   LDLCALC 100 (H) 06/21/2014   Physical Findings: AIMS: Facial and Oral Movements Muscles of Facial Expression: None, normal Lips and Perioral Area: None, normal Jaw: None, normal Tongue: None, normal,Extremity Movements Upper (arms, wrists, hands, fingers): None, normal Lower (legs, knees, ankles, toes): None, normal, Trunk Movements Neck, shoulders, hips: None, normal, Overall Severity Severity of abnormal movements (highest score from questions above): None, normal Incapacitation due to abnormal movements: None, normal Patient's awareness of abnormal movements (rate only patient's report): No Awareness, Dental Status Current problems with teeth and/or dentures?: No Does patient usually wear dentures?: No  CIWA:    COWS:     Musculoskeletal: Strength & Muscle Tone: within normal limits Gait & Station: normal Patient leans: N/A  Psychiatric Specialty Exam: Physical Exam  Nursing note and vitals reviewed.   Review of Systems  Constitutional: Negative for chills and fever.  Respiratory: Negative for cough and shortness of breath.   Cardiovascular: Negative for chest pain.  Gastrointestinal: Negative for abdominal pain, heartburn, nausea and vomiting.  Psychiatric/Behavioral: Positive for hallucinations. Negative for depression and suicidal ideas. The patient is not nervous/anxious and does not have insomnia.     Blood pressure 125/83, pulse (!) 108, temperature 98.1 F (36.7 C), resp. rate 16, height 5\' 4"  (1.626 m), weight 58.1 kg (128 lb), SpO2 97 %.Body mass index is 21.97 kg/m.  General Appearance: Disheveled  Eye Contact:  Fair  Speech:   Clear and Coherent and Normal Rate  Volume:  Normal  Mood:  Euthymic  Affect:  Congruent and Flat  Thought Process:  Coherent, Goal Directed and Descriptions of Associations: Tangential  Orientation:  Full (Time, Place, and Person)  Thought Content:  Hallucinations: Auditory  Suicidal Thoughts:  No  Homicidal Thoughts:  No  Memory:  Immediate;   Fair Recent;   Fair Remote;   Fair  Judgement:  Fair  Insight:  Fair  Psychomotor Activity:  Normal  Concentration:  Concentration: Fair  Recall:  [employer]  Fund of Knowledge:  Fair  Language:  Fair  Akathisia:  No  Handed:    AIMS (if indicated):     Assets:  Resilience  ADL's:  Intact  Cognition:  WNL  Sleep:  Number of Hours: 6    Treatment Plan Summary: Daily contact with patient to assess and evaluate symptoms and progress in treatment and Medication management    - Continue inpatient hospitalization.  - Will continue today 11/04/2017 plan as below except where it is noted.  - Schizoaffective disorder             - Changed Haldol 5mg  po BID to a combined dose of haldol 10 mg po Q hs due to complain of daytime sedation.  -EPS             - Changed cogentin from 0.5mg  po BID to a combined dose 1 mg po Q hs for EPS  - Anxiety              - Continue atarax 25mg  po TID prn anxiety  - Insomnia              -Continue trazodone 50mg  po qhs prn insomnia  - Agitation                     - Continue haldol 5mg  po/IM q6h prn agitation             - Continue benadryl 50mg  IM q6h prn severe agitation  -  Encourage participation in groups and therapeutic milieu  -Discharge planning will be ongoing  Armandina Stammer, NP, pmhnp, fnp-bc. 11/04/2017, 4:09 PMPatient ID: [name], female   DOB: [birth year], 32 y.o.   MRN: [id number]

## 2017-11-04 NOTE — [facility] Group Notes (Signed)
LCSW Group Therapy Note   11/04/2017 1:15pm   Type of Therapy and Topic:  Group Therapy:  Trust and Honesty  Participation Level:  Minimal  Description of Group:    In this group patients will be asked to explore the value of being honest.  Patients will be guided to discuss their thoughts, feelings, and behaviors related to honesty and trusting in others. Patients will process together how trust and honesty relate to forming relationships with peers, family members, and self. Each patient will be challenged to identify and express feelings of being vulnerable. Patients will discuss reasons why people are dishonest and identify alternative outcomes if one was truthful (to self or others). This group will be process-oriented, with patients participating in exploration of their own experiences, giving and receiving support, and processing challenge from other group members.   Therapeutic Goals: 1. Patient will identify why honesty is important to relationships and how honesty overall affects relationships.  2. Patient will identify a situation where they lied or were lied too and the  feelings, thought process, and behaviors surrounding the situation 3. Patient will identify the meaning of being vulnerable, how that feels, and how that correlates to being honest with self and others. 4. Patient will identify situations where they could have told the truth, but instead lied and explain reasons of dishonesty.   Summary of Patient Progress  "I think sarcasm is a form of honesty."  Talked about how it is something that bothers here "because the other person is not being really honest."  Therapeutic Modalities:   Cognitive Behavioral Therapy Solution Focused Therapy Motivational Interviewing Brief Therapy  Ida RogueRodney B Lakethia Coppess, LCSW 11/04/2017 1:41 PM

## 2017-11-04 NOTE — Progress Notes (Signed)
Recreation Therapy Notes  Date: 3.13.19 Time: 10:00 am  Location: [address]   Group Topic: Therapist, nutritional, Clinical research associate, Conservation officer, nature) Addresses:   Goal 1.1: To build healthy support systems - Patient will identify the importance of a healthy support system - Patient will identify at least one person in their support system  - Patient will identify ways on how to improve their support system                      Behavioral Response: Engaged   Intervention: Horticulture, Art   Activity: Patients were given a cup to use as a flower vase. Patients had the opportunity to paint and decorate their vase however they wanted. Patients then took the provided flowers and placed them into their vase. Patients were encouraged to keep their plant or give to one person in their support system   Education: [place], Clinical research associate, Teamwork, Discharge Planning    Education Outcome: Acknowledges Education  Clinical Observations/Feedback: Patient attended and participated appropriately during Recreation Therapy group treatment successfully engaging in group activity. Patient was able to identify at least one person in their support system. Patient participatred during introduction and closing discussion. Patient successfully met Goal 1.1 (see above).   [name], Recreation Therapy Intern   [name] 11/04/2017 11:21 AM

## 2017-11-04 NOTE — Progress Notes (Signed)
Patient denies SI, HI and AVH.  Patient has been attending groups and participating in unit activities.  Patient refused haldol this morning and physician was made aware.   Assess patient for safety, offer medications as prescribed, engage patient in 1:1 staff talks.   Patient able to contract for safety.  Continue to monitor as planned.

## 2017-11-04 NOTE — Progress Notes (Signed)
Patient ID: Monique LeydenPansy P [name], female   DOB: 01/31/1986, 32 y.o.   MRN: [id number]  D: Patient in her room most of the evening. Pt was pleasant but short with Clinical research associatewriter. Pt needed lots of convincing to take medications. Pt wanted to touch the package, read the names and the prescribing doctor before taking the medications. Pt denies A/VH but does appear to be responding to internal stimuli. Pt denies  SI/HI and pain.No behavioral issues noted.  A: Support and encouragement offered as needed to express needs. Medications administered as prescribed.  R: Patient is safe on the unit. Will continue to monitor  for safety and stability.

## 2017-11-05 MED ORDER — HALOPERIDOL DECANOATE 100 MG/ML IM SOLN
100.0000 mg | INTRAMUSCULAR | Status: DC
  Administered 2017-11-05: 100 mg via INTRAMUSCULAR
  Filled 2017-11-05 (×2): qty 1

## 2017-11-05 MED ORDER — HALOPERIDOL 5 MG PO TABS
10.0000 mg | ORAL_TABLET | Freq: Every day | ORAL | Status: DC
  Administered 2017-11-09: 10 mg via ORAL
  Filled 2017-11-05 (×7): qty 2

## 2017-11-05 NOTE — Progress Notes (Signed)
Pt in room visiting with her mother.  Pt sts that the haldol she received today "worked".  She denies SI, HI and AVH.  Pt verbally contracts for safety.  At night med pass, pt refuses to take her meds stating that she has already taken them. Pt remains in her room sleeping

## 2017-11-05 NOTE — Progress Notes (Signed)
Patient has been noted to be on the day room, attending groups and participating in unit activities.  Patient was compliant with medications this shift and had no incidents of behavioral dyscontrol.  Patient was encouraged to complete hygiene.  Patient denies SI, HI and AVH but was noted to be responding to internal stimuli.   Assess patient for safety, offer medications as prescribed, engage patient in 1:1 therapeutic staff talks.   Patient able to contract for safety, continue to monitor as prescribed.

## 2017-11-05 NOTE — Progress Notes (Signed)
[facility] MD Progress Note  11/05/2017 3:05 PM [name]  MRN:  [id number] Subjective:    [name] is a 32 y/o F with history of schizoaffective disorder bipolar type who was admitted on IVC after she presented to the ED with worsening psychosis. As per pt's mother, pt had been disorganized, non-adherent to her home medication regimen, paranoid, and responding to internal stimuli. Pt refused to be resumed on Invega in the ED, and she was given IM zyprexa while in the ED. Upon initial presentation, pt continued to refuse Invega, and so she was started on haldol oral form, and dose was titrated up during her stay.  Today upon evaluation, pt shares, "I was just wondering about my name. My name is [name] and I think there's a [name] - that's '[name]', and I'm the other part [name]', which means I'm odd." Pt describes hearing about another individual on television with the name [name] that made her concerned and caused anxiety, but after discussion with patient that she is a unique individual and with reassurance, pt reported she was feeling "okay." Pt denies any specific concerns today. She denies SI/HI/AH/VH, but she appears to whisper to herself during the interview, apparently responding to internal stimuli. She denies physical complaints. She is sleeping well and her appetite is good. She has some daytime fatigue which she associates with oral haldol dose in the AM, and she is in agreement to consolidate haldol dose to just at bedtime. Discussed with patient about option of long-acting injectable form of haldol, and pt was in agreement.     Principal Problem: Schizoaffective disorder, bipolar type (HCC) Diagnosis:   Patient Active Problem List   Diagnosis Date Noted  . Schizophrenia, paranoid type (HCC) [F20.0] 07/14/2017  . Somatic dysfunction of pelvic region [M99.05] 10/26/2014  . Paranoia (HCC) [F22]   . Schizoaffective disorder, bipolar type (HCC) [F25.0] 03/11/2012   Total Time spent  with patient: 30 minutes  Past Psychiatric History: see H&P  Past Medical History:  Past Medical History:  Diagnosis Date  . Bipolar 1 disorder (HCC) 2011  . Paranoia (HCC)   . Schizoaffective disorder (HCC)    History reviewed. No pertinent surgical history. Family History:  Family History  Problem Relation Age of Onset  . Cancer Father        lymphoma  . Hypertension Mother   . Diabetes Neg Hx   . Heart disease Neg Hx    Family Psychiatric  History: see H&P Social History:  Social History   Substance and Sexual Activity  Alcohol Use No   Comment: Pt denies      Social History   Substance and Sexual Activity  Drug Use No   Comment: Pt denies    Social History   Socioeconomic History  . Marital status: Single    Spouse name: None  . Number of children: 0  . Years of education: some colle  . Highest education level: None  Social Needs  . Financial resource strain: None  . Food insecurity - worry: None  . Food insecurity - inability: None  . Transportation needs - medical: None  . Transportation needs - non-medical: None  Occupational History  . Occupation: [name]     Comment: trying to start   Tobacco Use  . Smoking status: Never Smoker  . Smokeless tobacco: Never Used  Substance and Sexual Activity  . Alcohol use: No    Comment: Pt denies   . Drug use: No  Comment: Pt denies  . Sexual activity: No    Birth control/protection: Abstinence, None  Other Topics Concern  . None  Social History Narrative   Work or School: not working   Home Situation: lives with mother   Spiritual Beliefs: Christian   Lifestyle: no regular exercise; healthy diet         Additional Social History:                         Sleep: Good  Appetite:  Good  Current Medications: Current Facility-Administered Medications  Medication Dose Route Frequency Provider Last Rate Last Dose  . acetaminophen (TYLENOL) tablet 650 mg  650 mg Oral Q6H PRN Charm RingsLord, Jamison  Y, NP      . alum & mag hydroxide-simeth (MAALOX/MYLANTA) 200-200-20 MG/5ML suspension 30 mL  30 mL Oral Q4H PRN Charm RingsLord, Jamison Y, NP      . benztropine (COGENTIN) tablet 1 mg  1 mg Oral QHS Armandina StammerNwoko, Agnes I, NP   1 mg at 11/04/17 2158  . diphenhydrAMINE (BENADRYL) injection 50 mg  50 mg Intramuscular Q6H PRN Jolyne Loaainville, Alin Chavira T, MD      . haloperidol (HALDOL) tablet 5 mg  5 mg Oral BID Micheal Likensainville, Jc Veron T, MD   5 mg at 11/05/17 0803  . haloperidol (HALDOL) tablet 5 mg  5 mg Oral Q6H PRN Micheal Likensainville, Terrence Wishon T, MD       Or  . haloperidol lactate (HALDOL) injection 5 mg  5 mg Intramuscular Q6H PRN Micheal Likensainville, Abhijay Morriss T, MD      . hydrOXYzine (ATARAX/VISTARIL) tablet 25 mg  25 mg Oral TID PRN Armandina StammerNwoko, Agnes I, NP   25 mg at 11/03/17 0827  . magnesium hydroxide (MILK OF MAGNESIA) suspension 30 mL  30 mL Oral Daily PRN Charm RingsLord, Jamison Y, NP      . traZODone (DESYREL) tablet 50 mg  50 mg Oral QHS PRN Charm RingsLord, Jamison Y, NP   50 mg at 11/04/17 2158    Lab Results: No results found for this or any previous visit (from the past 48 hour(s)).  Blood Alcohol level:  Lab Results  Component Value Date   ETH <10 10/31/2017   ETH <5 01/27/2015    Metabolic Disorder Labs: Lab Results  Component Value Date   HGBA1C 5.5 11/02/2017   MPG 111.15 11/02/2017   No results found for: PROLACTIN Lab Results  Component Value Date   CHOL 179 07/27/2014   TRIG 49 07/27/2014   HDL 44 07/27/2014   CHOLHDL 5 06/21/2014   VLDL 10 07/27/2014   LDLCALC 125 (H) 07/27/2014   LDLCALC 100 (H) 06/21/2014    Physical Findings: AIMS: Facial and Oral Movements Muscles of Facial Expression: None, normal Lips and Perioral Area: None, normal Jaw: None, normal Tongue: None, normal,Extremity Movements Upper (arms, wrists, hands, fingers): None, normal Lower (legs, knees, ankles, toes): None, normal, Trunk Movements Neck, shoulders, hips: None, normal, Overall Severity Severity of abnormal movements (highest  score from questions above): None, normal Incapacitation due to abnormal movements: None, normal Patient's awareness of abnormal movements (rate only patient's report): No Awareness, Dental Status Current problems with teeth and/or dentures?: No Does patient usually wear dentures?: No  CIWA:    COWS:     Musculoskeletal: Strength & Muscle Tone: within normal limits Gait & Station: normal Patient leans: N/A  Psychiatric Specialty Exam: Physical Exam  Nursing note and vitals reviewed.   Review of Systems  Constitutional: Negative for chills and  fever.  Respiratory: Negative for cough and shortness of breath.   Cardiovascular: Negative for chest pain.  Gastrointestinal: Negative for abdominal pain, heartburn, nausea and vomiting.  Psychiatric/Behavioral: Negative for depression, hallucinations and suicidal ideas. The patient is not nervous/anxious.     Blood pressure 125/83, pulse (!) 108, temperature 98.1 F (36.7 C), resp. rate 16, height 5\' 4"  (1.626 m), weight 58.1 kg (128 lb), SpO2 97 %.Body mass index is 21.97 kg/m.  General Appearance: Disheveled  Eye Contact:  Fair  Speech:  Clear and Coherent and Normal Rate  Volume:  Normal  Mood:  Anxious and Euthymic  Affect:  Blunt and Congruent  Thought Process:  Coherent, Goal Directed and Descriptions of Associations: Loose  Orientation:  Full (Time, Place, and Person)  Thought Content:  Illogical, Delusions and Hallucinations: Auditory  Suicidal Thoughts:  No  Homicidal Thoughts:  No  Memory:  Immediate;   Fair Recent;   Fair Remote;   Fair  Judgement:  Fair  Insight:  Fair  Psychomotor Activity:  Normal  Concentration:  Concentration: Fair  Recall:  [employer] of Knowledge:  Fair  Language:  Fair  Akathisia:  No  Handed:    AIMS (if indicated):     Assets:  Communication Skills Desire for Improvement Physical Health Resilience Social Support  ADL's:  Intact  Cognition:  WNL  Sleep:  Number of Hours: 6.75    Treatment Plan Summary: Daily contact with patient to assess and evaluate symptoms and progress in treatment and Medication management    - Continue inpatient hospitalization  - Schizoaffective disorder - Change Haldol 5mg  po BID to haldol 10mg  po qhs   - Start Haldol Decanoate 100mg  IM q30 days   -EPS - Continue cogentin 0.5mg  po BID prn EPS  - Anxiety - Continue atarax 25mg  po TID prn anxiety  - Insomnia -Continue trazodone 50mg  po qhs prn insomnia  - Agitation - Continue haldol 5mg  po/IM q6h prn agitation - Continue benadryl 50mg  IM q6h prn severe agitation  -Encourage participation in groups and therapeutic milieu  -Discharge planning will be ongoing  Micheal Likens, MD 11/05/2017, 3:05 PM

## 2017-11-05 NOTE — [facility] Group Notes (Signed)
[facility] 11/05/2017 11:14 AM   Type of Therapy: Mental Health Association Presentation   Participation Level:Stayed for the first 8 minutes, left, and did not return   Participation Quality: Attentive   Affect: Appropriate   Cognitive: Oriented   Insight: Developing/Improving   Engagement in Therapy: Engaged   Modes of Intervention: Discussion, Education and Socialization   Summary of Progress/Problems: Mental Health Association (MHA) Speaker came to talk about his personal journey with mental health. The pt processed ways by which to relate to the speaker. MHA speaker provided handouts and educational information pertaining to groups and services offered by the [facility]MHA. Pt was engaged in speaker's presentation and was receptive to resources provided.      Daryel GeraldRodney Ronni Osterberg, [place] 11/05/2017 11:14 AM

## 2017-11-05 NOTE — Progress Notes (Signed)
The patient was pleased with the fact that she went outside today and that she had a good visit with her mother this evening. Her goal for tomorrow is to speak with the staff about getting discharged.

## 2017-11-05 NOTE — Progress Notes (Signed)
Recreation Therapy Notes  Date: 3.14.19 Time: 10:00 a.m. Location: 500 Hall Dayroom   Group Topic: Communication   Goal Area(s) Addresses:  Goal 1.1: To improve communication  - Group will communicate with peers during group session    - Group will identify the importance of healthy communication  - Group will answer at least three questions during Recreation Therapy tx  Behavioral Response: Engaged   Intervention: Game   Activity: Jenga: Patients played Jenga as normal. When a patient pulled out a [place] piece, based on the color of the skittle, the patient answered a specific question for that color.   Education: Communication, Team-Work   Education Outcome: Acknowledges education  Clinical Observations/Feedback: Patient attended and participated appropriately in Recreation Therapy group treatment successfully engaging in group activity. Patient was able to communicate with peers in a appropriate manner. Patient was able to identify the importance of communication. Patient was able to answer at least three questions about communication during Recreation Therapy Group treatment. Patient successfully met Goal 1.1 (see above).   Ranell Patrick, Recreation Therapy Intern   Ranell Patrick 11/05/2017 8:43 AM

## 2017-11-06 NOTE — Progress Notes (Signed)
[facility] MD Progress Note  11/06/2017 1:22 PM REY FORS  MRN:  [id number] Subjective:    [name] is a 32 y/o F with history of schizoaffective disorder bipolar type who was admitted on IVC after she presented to the ED with worsening psychosis. As per pt's mother, pt had been disorganized, non-adherent to her home medication regimen, paranoid, and responding to internal stimuli. Pt refused to be resumed on Invega in the ED, and she was given IM zyprexawhile in the ED.Upon initial presentation, pt continued to refuse Invega, and so she was started on haldol oral form, and dose was titrated up during her stay, and yesterday it was consolidated to bedtime to reduce daytime fatigue. She also was started on haldol decanoate due to poor adherence to her outpatient regimen after previous hospitalizations.  Today upon evaluation, pt shares, "I'm pretty good." She continues to have disorganized thoughts, and tangential responses. When asked to talk about her overall progress, pt is tangential but she is able to be redirected, and she notes that she feels she has improved since being in the hospital. She appears to whisper to herself and respond to internal stimuli during interview, but she denies SI/HI/AH/VH. Pt had refused her haldol oral form last evening stating to RN staff that she felt she did not need it because she was getting the injection. Discussed with patient today that it is important for her to take both the oral form and injection initially to build up concentration, and pt verbalized agreement to resume oral form of medications. She had poor sleep last evening as per RN staff, but pt denied this when asked about her sleep. Discussed with patient about disposition plan to remain in the hospital with plan to monitor for additional improvement and symptom stability in the context of haldol decanoate being administered and pt now in agreement to adhere to oral haldol. Pt was in agreement with the above  plan and she had no further questions, comments, or concerns.     Principal Problem: Schizoaffective disorder, bipolar type (HCC) Diagnosis:   Patient Active Problem List   Diagnosis Date Noted  . Schizophrenia, paranoid type (HCC) [F20.0] 07/14/2017  . Somatic dysfunction of pelvic region [M99.05] 10/26/2014  . Paranoia (HCC) [F22]   . Schizoaffective disorder, bipolar type (HCC) [F25.0] 03/11/2012   Total Time spent with patient: 30 minutes  Past Psychiatric History: see H&P  Past Medical History:  Past Medical History:  Diagnosis Date  . Bipolar 1 disorder (HCC) 2011  . Paranoia (HCC)   . Schizoaffective disorder (HCC)    History reviewed. No pertinent surgical history. Family History:  Family History  Problem Relation Age of Onset  . Cancer Father        lymphoma  . Hypertension Mother   . Diabetes Neg Hx   . Heart disease Neg Hx    Family Psychiatric  History: see H&P Social History:  Social History   Substance and Sexual Activity  Alcohol Use No   Comment: Pt denies      Social History   Substance and Sexual Activity  Drug Use No   Comment: Pt denies    Social History   Socioeconomic History  . Marital status: Single    Spouse name: None  . Number of children: 0  . Years of education: some colle  . Highest education level: None  Social Needs  . Financial resource strain: None  . Food insecurity - worry: None  . Food insecurity -  inability: None  . Transportation needs - medical: None  . Transportation needs - non-medical: None  Occupational History  . Occupation: [employer]     Comment: trying to start   Tobacco Use  . Smoking status: Never Smoker  . Smokeless tobacco: Never Used  Substance and Sexual Activity  . Alcohol use: No    Comment: Pt denies   . Drug use: No    Comment: Pt denies  . Sexual activity: No    Birth control/protection: Abstinence, None  Other Topics Concern  . None  Social History Narrative   Work or School: not  working   Home Situation: lives with mother   Spiritual Beliefs: Christian   Lifestyle: no regular exercise; healthy diet         Additional Social History:                         Sleep: Poor  Appetite:  Fair  Current Medications: Current Facility-Administered Medications  Medication Dose Route Frequency Provider Last Rate Last Dose  . acetaminophen (TYLENOL) tablet 650 mg  650 mg Oral Q6H PRN Charm RingsLord, Jamison Y, NP      . alum & mag hydroxide-simeth (MAALOX/MYLANTA) 200-200-20 MG/5ML suspension 30 mL  30 mL Oral Q4H PRN Charm RingsLord, Jamison Y, NP      . benztropine (COGENTIN) tablet 1 mg  1 mg Oral QHS Armandina StammerNwoko, Agnes I, NP   1 mg at 11/04/17 2158  . diphenhydrAMINE (BENADRYL) injection 50 mg  50 mg Intramuscular Q6H PRN Micheal Likensainville, Reese Senk T, MD      . haloperidol (HALDOL) tablet 10 mg  10 mg Oral QHS Micheal Likensainville, Evlyn Amason T, MD      . haloperidol (HALDOL) tablet 5 mg  5 mg Oral Q6H PRN Micheal Likens[place]ville, Makila Colombe T, MD       Or  . haloperidol lactate (HALDOL) injection 5 mg  5 mg Intramuscular Q6H PRN Micheal Likensainville, Kalayah Leske T, MD      . haloperidol decanoate (HALDOL DECANOATE) 100 MG/ML injection 100 mg  100 mg Intramuscular Q30 days Jolyne Loaainville, Kody Vigil T, MD   100 mg at 11/05/17 1655  . hydrOXYzine (ATARAX/VISTARIL) tablet 25 mg  25 mg Oral TID PRN Armandina StammerNwoko, Agnes I, NP   25 mg at 11/03/17 0827  . magnesium hydroxide (MILK OF MAGNESIA) suspension 30 mL  30 mL Oral Daily PRN Charm RingsLord, Jamison Y, NP      . traZODone (DESYREL) tablet 50 mg  50 mg Oral QHS PRN Charm RingsLord, Jamison Y, NP   50 mg at 11/04/17 2158    Lab Results: No results found for this or any previous visit (from the past 48 hour(s)).  Blood Alcohol level:  Lab Results  Component Value Date   ETH <10 10/31/2017   ETH <5 01/27/2015    Metabolic Disorder Labs: Lab Results  Component Value Date   HGBA1C 5.5 11/02/2017   MPG 111.15 11/02/2017   No results found for: PROLACTIN Lab Results  Component Value Date   CHOL  179 07/27/2014   TRIG 49 07/27/2014   HDL 44 07/27/2014   CHOLHDL 5 06/21/2014   VLDL 10 07/27/2014   LDLCALC 125 (H) 07/27/2014   LDLCALC 100 (H) 06/21/2014    Physical Findings: AIMS: Facial and Oral Movements Muscles of Facial Expression: None, normal Lips and Perioral Area: None, normal Jaw: None, normal Tongue: None, normal,Extremity Movements Upper (arms, wrists, hands, fingers): None, normal Lower (legs, knees, ankles, toes): None, normal, Trunk Movements Neck,  shoulders, hips: None, normal, Overall Severity Severity of abnormal movements (highest score from questions above): None, normal Incapacitation due to abnormal movements: None, normal Patient's awareness of abnormal movements (rate only patient's report): No Awareness, Dental Status Current problems with teeth and/or dentures?: No Does patient usually wear dentures?: No  CIWA:    COWS:     Musculoskeletal: Strength & Muscle Tone: within normal limits Gait & Station: normal Patient leans: N/A  Psychiatric Specialty Exam: Physical Exam  Nursing note and vitals reviewed.   Review of Systems  Constitutional: Negative for chills and fever.  Respiratory: Negative for cough and shortness of breath.   Cardiovascular: Negative for chest pain.  Gastrointestinal: Negative for abdominal pain, heartburn, nausea and vomiting.  Psychiatric/Behavioral: Negative for depression, hallucinations, substance abuse and suicidal ideas.    Blood pressure 125/83, pulse (!) 108, temperature 98.1 F (36.7 C), resp. rate 16, height 5\' 4"  (1.626 m), weight 58.1 kg (128 lb), SpO2 97 %.Body mass index is 21.97 kg/m.  General Appearance: Disheveled  Eye Contact:  Minimal  Speech:  Blocked  Volume:  Normal  Mood:  Anxious  Affect:  Flat  Thought Process:  Disorganized and Descriptions of Associations: Tangential  Orientation:  Full (Time, Place, and Person)  Thought Content:  Hallucinations: Auditory and Tangential  Suicidal  Thoughts:  No  Homicidal Thoughts:  No  Memory:  Immediate;   Fair Recent;   Fair Remote;   Fair  Judgement:  Poor  Insight:  Lacking  Psychomotor Activity:  Normal  Concentration:  Concentration: Poor  Recall:  Poor  Fund of Knowledge:  Poor  Language:  Poor  Akathisia:  No  Handed:    AIMS (if indicated):     Assets:  Resilience  ADL's:  Intact  Cognition:  WNL  Sleep:  Number of Hours: 2   Treatment Plan Summary: Daily contact with patient to assess and evaluate symptoms and progress in treatment and Medication management  -Continue inpatient hospitalization  - Schizoaffective disorder -Continue haldol 10mg  po qhs             - Continue Haldol Decanoate 100mg  IM q30 days (given on 11/06/17)  -EPS -Continuecogentin 0.5mg  po BID prn EPS  - Anxiety -Continueatarax 25mg  po TID prn anxiety  - Insomnia -Continuetrazodone 50mg  po qhs prn insomnia  - Agitation -Continuehaldol 5mg  po/IM q6h prn agitation -Continuebenadryl 50mg  IM q6h prn severe agitation  -Encourage participation in groups and therapeutic milieu  -Discharge planning will be ongoing  Micheal Likens, MD 11/06/2017, 1:22 PM

## 2017-11-06 NOTE — Progress Notes (Signed)
Recreation Therapy Notes  Date: 3.15.19 Time: 10:00 a.m. Location: [address] Dayroom  Group Topic: Stress Management  Goal Area(s) Addresses:  Goal 1.1: To reduce stress  -Patient will report feeling a reduction in stress level  -Patient will identify the importance of stress management  -Patient will participate during stress management group treatment    Behavioral Response: Engaged  Intervention: Stress Management  Activity: Meditattion- Recreation Therapy Instructed a yoga group. Patients were in a peaceful environment with soft lighting enhancing patients mood.   Education: Stress Management, Discharge Planning.   Education Outcome: Acknowledges edcuation/In group clarification offered/Needs additional education  Clinical Observations/Feedback:: Patient attended and participated appropriately during Recreation Therapy group treatment. Patient reported feeling a reduction in stress level. Patient was able to identify the benefits of yoga. Patient successfully met Goal 1.1 (see above).   Ranell Patrick, Recreation Therapy Intern   Ranell Patrick 11/06/2017 10:53 AM

## 2017-11-06 NOTE — Progress Notes (Addendum)
Nursing Progress Note: 7p-7a D: Pt currently presents with a animated/tangential/childlike/defensive/disheveled/paranoia affect and behavior. Interacting appropriately with the milieu. Pt reports good sleep during the previous night with current medication regimen. Pt did not attend wrap-up group.  A: Pt refused medications. Pt's labs and vitals were monitored throughout the night. Pt supported emotionally and encouraged to express concerns and questions. Pt educated on medications.  R: Pt's safety ensured with 15 minute and environmental checks. Pt currently denies SI, HI, and VH and endorses AH. Pt verbally contracts to seek staff if SI,HI, or AVH occurs and to consult with staff before acting on any harmful thoughts. Will continue to monitor.

## 2017-11-06 NOTE — Tx Team (Signed)
Interdisciplinary Treatment and Diagnostic Plan Update  11/06/2017 Time of Session: 3:52 PM  [name] MRN: [id number]  Principal Diagnosis: Schizoaffective disorder, bipolar type [name])  Secondary Diagnoses: Principal Problem:   Schizoaffective disorder, bipolar type ([place])   Current Medications:  Current Facility-Administered Medications  Medication Dose Route Frequency Provider Last Rate Last Dose  . acetaminophen (TYLENOL) tablet 650 mg  650 mg Oral Q6H PRN [name], NP      . alum & mag hydroxide-simeth (MAALOX/MYLANTA) 200-200-20 MG/5ML suspension 30 mL  30 mL Oral Q4H PRN [name], NP      . benztropine (COGENTIN) tablet 1 mg  1 mg Oral QHS [name], NP   1 mg at 11/04/17 2158  . diphenhydrAMINE (BENADRYL) injection 50 mg  50 mg Intramuscular Q6H PRN [name], MD      . haloperidol (HALDOL) tablet 10 mg  10 mg Oral QHS [name], MD      . haloperidol (HALDOL) tablet 5 mg  5 mg Oral Q6H PRN [name], MD       Or  . haloperidol lactate (HALDOL) injection 5 mg  5 mg Intramuscular Q6H PRN [name], MD      . haloperidol decanoate (HALDOL DECANOATE) 100 MG/ML injection 100 mg  100 mg Intramuscular Q30 days [name], MD   100 mg at 11/05/17 1655  . hydrOXYzine (ATARAX/VISTARIL) tablet 25 mg  25 mg Oral TID PRN [name], NP   25 mg at 11/03/17 0827  . magnesium hydroxide (MILK OF MAGNESIA) suspension 30 mL  30 mL Oral Daily PRN [name], NP      . traZODone (DESYREL) tablet 50 mg  50 mg Oral QHS PRN [name], NP   50 mg at 11/04/17 2158    PTA Medications: Medications Prior to Admission  Medication Sig Dispense Refill Last Dose  . benztropine (COGENTIN) 0.5 MG tablet Take 1 tablet (0.5 mg total) by mouth 2 (two) times daily as needed (EPS). 60 tablet 0   . hydrOXYzine (ATARAX/VISTARIL) 25 MG tablet Take 1 tablet (25 mg total) by mouth 3 (three) times daily as needed  for anxiety. 60 tablet 0   . paliperidone (INVEGA SUSTENNA) 234 MG/1.5ML SUSP injection Inject 117 mg into the muscle once for 1 dose. (Due 07-28-17): For mood control (Patient not taking: Reported on 10/31/2017) 0.75 mL 0 Not Taking at Unknown time  . paliperidone (INVEGA SUSTENNA) 234 MG/1.5ML SUSP injection Inject 117 mg into the muscle every 30 (thirty) days. (Due on 08-28-17): For mood control 0.9 mL 0   . paliperidone (INVEGA) 6 MG 24 hr tablet Take 1 tablet (6 mg) in the morning & 1 tablet (6 mg) at bedtime: For mood control (Patient not taking: Reported on 10/31/2017) 30 tablet 0 Not Taking at Unknown time  . traZODone (DESYREL) 50 MG tablet Take 1 tablet (50 mg total) by mouth at bedtime as needed for sleep. 30 tablet 0     Patient Stressors: Financial difficulties Medication change or noncompliance  Patient Strengths: Average or above average intelligence Communication skills Supportive family/friends  Treatment Modalities: Medication Management, Group therapy, Case management,  1 to 1 session with clinician, Psychoeducation, Recreational therapy.   Physician Treatment Plan for Primary Diagnosis: Schizoaffective disorder, bipolar type ([name]) Long Term Goal(s): Improvement in symptoms so as ready for discharge  Short Term Goals: Ability to identify and develop effective coping behaviors will improve Ability to identify  and develop effective coping behaviors will improve Compliance with prescribed medications will improve  Medication Management: Evaluate patient's response, side effects, and tolerance of medication regimen.  Therapeutic Interventions: 1 to 1 sessions, Unit Group sessions and Medication administration.  Evaluation of Outcomes: Progressing  Physician Treatment Plan for Secondary Diagnosis: Principal Problem:   Schizoaffective disorder, bipolar type ([name])   Long Term Goal(s): Improvement in symptoms so as ready for discharge  Short Term Goals: Ability to identify  and develop effective coping behaviors will improve Ability to identify and develop effective coping behaviors will improve Compliance with prescribed medications will improve  Medication Management: Evaluate patient's response, side effects, and tolerance of medication regimen.  Therapeutic Interventions: 1 to 1 sessions, Unit Group sessions and Medication administration.  Evaluation of Outcomes: Progressing   RN Treatment Plan for Primary Diagnosis: Schizoaffective disorder, bipolar type ([name]) Long Term Goal(s): Knowledge of disease and therapeutic regimen to maintain health will improve  Short Term Goals: Ability to identify and develop effective coping behaviors will improve and Compliance with prescribed medications will improve  Medication Management: RN will administer medications as ordered by provider, will assess and evaluate patient's response and provide education to patient for prescribed medication. RN will report any adverse and/or side effects to prescribing provider.  Therapeutic Interventions: 1 on 1 counseling sessions, Psychoeducation, Medication administration, Evaluate responses to treatment, Monitor vital signs and CBGs as ordered, Perform/monitor CIWA, COWS, AIMS and Fall Risk screenings as ordered, Perform wound care treatments as ordered.  Evaluation of Outcomes: Progressing   LCSW Treatment Plan for Primary Diagnosis: Schizoaffective disorder, bipolar type ([name]) Long Term Goal(s): Safe transition to appropriate next level of care at discharge, Engage patient in therapeutic group addressing interpersonal concerns.  Short Term Goals: Engage patient in aftercare planning with referrals and resources  Therapeutic Interventions: Assess for all discharge needs, 1 to 1 time with Social worker, Explore available resources and support systems, Assess for adequacy in community support network, Educate family and significant other(s) on suicide prevention, Complete  Psychosocial Assessment, Interpersonal group therapy.  Evaluation of Outcomes: Met  Return home, follow up with [facility]   Progress in Treatment: Attending groups: Yes Participating in groups: Yes Taking medication as prescribed: Yes Toleration medication: Yes, no side effects reported at this time Family/Significant other contact made: Two attempts Patient understands diagnosis: No Limited insight Discussing patient identified problems/goals with staff: Yes Medical problems stabilized or resolved: Yes Denies suicidal/homicidal ideation: Yes Issues/concerns per patient self-inventory: None Other: N/A  New problem(s) identified: None identified at this time.   New Short Term/Long Term Goal(s): Pt was unable to identify any goals, declined to sign   Discharge Plan or Barriers:   Reason for Continuation of Hospitalization: Disorganization Delusions  Paranoia Medication stabilization   Estimated Length of Stay: 3/20   Attendees: Patient: [name] 11/06/2017  3:52 PM  Physician: Maris Berger, MD 11/06/2017  3:52 PM  Nursing: Sena Hitch, RN 11/06/2017  3:52 PM  RN Care Manager: Lars Pinks, RN 11/06/2017  3:52 PM  Social Worker: Ripley Fraise 11/06/2017  3:52 PM  Recreational Therapist: Winfield Cunas 11/06/2017  3:52 PM  Other: Norberto Sorenson 11/06/2017  3:52 PM  Other:  11/06/2017  3:52 PM    Scribe for Treatment Team:  Roque Lias LCSW 11/06/2017 3:52 PM

## 2017-11-06 NOTE — Progress Notes (Signed)
DAR NOTE: Patient presents with flat affect and depressed mood.  Observed talking and responding to internal stimuli.  Denies pain, auditory and visual hallucinations.  Described energy level as normal and concentration as good.  Rates depression at 0, hopelessness at 0, and anxiety at 0.  Maintained on routine safety checks.  Medications given as prescribed.  Support and encouragement offered as needed.  Attended group and participated.  States goal for today is "work towards discharge."  Patient visible in milieu for activity and therapy.  Minimal interaction with staff.  Offered no complaint.

## 2017-11-06 NOTE — [facility] Group Notes (Signed)
LCSW Group Therapy Note  11/06/2017 1:15pm  Type of Therapy and Topic:  Group Therapy:  Feelings around Relapse and Recovery  Participation Level:  Active   Description of Group:    Patients in this group will discuss emotions they experience before and after a relapse. They will process how experiencing these feelings, or avoidance of experiencing them, relates to having a relapse. Facilitator will guide patients to explore emotions they have related to recovery. Patients will be encouraged to process which emotions are more powerful. They will be guided to discuss the emotional reaction significant others in their lives may have to their relapse or recovery. Patients will be assisted in exploring ways to respond to the emotions of others without this contributing to a relapse.  Therapeutic Goals: 1. Patient will identify two or more emotions that lead to a relapse for them 2. Patient will identify two emotions that result when they relapse 3. Patient will identify two emotions related to recovery 4. Patient will demonstrate ability to communicate their needs through discussion and/or role plays   Summary of Patient Progress:   [name] came to group and remained there the entire time.  Her definition of relapse is going backwards on things that aren't good for you and going back to old ways.  She shared that once she is discharged she will buy a planner to help her keep track of her medication and goals that she sets for herself.  One of her goals is to manage her debit within a certain time period. She hopes to manage her relapse by using these tools on a daily basis.   Therapeutic Modalities:   Cognitive Behavioral Therapy Solution-Focused Therapy Assertiveness Training Relapse Prevention Therapy   [name] [name], Student-Social Work 11/06/2017 1:29 PM

## 2017-11-07 DIAGNOSIS — R450 Nervousness: Secondary | ICD-10-CM

## 2017-11-07 DIAGNOSIS — R451 Restlessness and agitation: Secondary | ICD-10-CM

## 2017-11-07 NOTE — Progress Notes (Signed)
Patient ID: Monique LeydenPansy P [name], female   DOB: 12/25/1985, 32 y.o.   MRN: [id number] [facility]  11/07/2017 4:13 PM [name]  MRN:  [id number] Subjective:    [name] Calleros is a 32 y/o F with history of schizoaffective disorder bipolar type who was admitted on IVC after she presented to the ED with worsening psychosis. As per pt's mother, pt had been disorganized, non-adherent to her home medication regimen, paranoid, and responding to internal stimuli. Pt refused to be resumed on Invega in the ED, and she was given IM zyprexawhile in the ED.Upon initial presentation, pt continued to refuse Invega, and so she was started on haldol oral form, and dose was titrated up during her stay, and yesterday it was consolidated to bedtime to reduce daytime fatigue. She also was started on haldol decanoate due to poor adherence to her outpatient regimen after previous hospitalizations.  Today, [name] has been lying in her bed all afternoon.  Denies depression, no psychosis noted, disorganization better.  Forwards little except she feels tired, focused on discharge.   Principal Problem: Schizoaffective disorder, bipolar type (HCC) Diagnosis:   Patient Active Problem List   Diagnosis Date Noted  . Schizoaffective disorder, bipolar type (HCC) [F25.0] 03/11/2012    Priority: High  . Schizophrenia, paranoid type (HCC) [F20.0] 07/14/2017  . Somatic dysfunction of pelvic region [M99.05] 10/26/2014  . Paranoia (HCC) [F22]    Total Time spent with patient: 30 minutes  Past Psychiatric History: see H&P  Past Medical History:  Past Medical History:  Diagnosis Date  . Bipolar 1 disorder (HCC) 2011  . Paranoia (HCC)   . Schizoaffective disorder (HCC)    History reviewed. No pertinent surgical history. Family History:  Family History  Problem Relation Age of Onset  . Cancer Father        lymphoma  . Hypertension Mother   . Diabetes Neg Hx   . Heart disease Neg Hx    Family Psychiatric  History: see  H&P Social History:  Social History   Substance and Sexual Activity  Alcohol Use No   Comment: Pt denies      Social History   Substance and Sexual Activity  Drug Use No   Comment: Pt denies    Social History   Socioeconomic History  . Marital status: Single    Spouse name: None  . Number of children: 0  . Years of education: some colle  . Highest education level: None  Social Needs  . Financial resource strain: None  . Food insecurity - worry: None  . Food insecurity - inability: None  . Transportation needs - medical: None  . Transportation needs - non-medical: None  Occupational History  . Occupation: [employer]     Comment: trying to start   Tobacco Use  . Smoking status: Never Smoker  . Smokeless tobacco: Never Used  Substance and Sexual Activity  . Alcohol use: No    Comment: Pt denies   . Drug use: No    Comment: Pt denies  . Sexual activity: No    Birth control/protection: Abstinence, None  Other Topics Concern  . None  Social History Narrative   Work or School: not working   Home Situation: lives with mother   Spiritual Beliefs: Christian   Lifestyle: no regular exercise; healthy diet         Additional Social History:  Sleep: Poor  Appetite:  Fair  Current Medications: Current Facility-Administered Medications  Medication Dose Route Frequency Provider Last Rate Last Dose  . acetaminophen (TYLENOL) tablet 650 mg  650 mg Oral Q6H PRN Charm Rings, NP      . alum & mag hydroxide-simeth (MAALOX/MYLANTA) 200-200-20 MG/5ML suspension 30 mL  30 mL Oral Q4H PRN Charm Rings, NP      . benztropine (COGENTIN) tablet 1 mg  1 mg Oral QHS Armandina Stammer I, NP   1 mg at 11/04/17 2158  . diphenhydrAMINE (BENADRYL) injection 50 mg  50 mg Intramuscular Q6H PRN Micheal Likens, MD      . haloperidol (HALDOL) tablet 10 mg  10 mg Oral QHS Micheal Likens, MD      . haloperidol (HALDOL) tablet 5 mg  5 mg Oral  Q6H PRN Micheal Likens, MD       Or  . haloperidol lactate (HALDOL) injection 5 mg  5 mg Intramuscular Q6H PRN Micheal Likens, MD      . haloperidol decanoate (HALDOL DECANOATE) 100 MG/ML injection 100 mg  100 mg Intramuscular Q30 days Jolyne Loa T, MD   100 mg at 11/05/17 1655  . hydrOXYzine (ATARAX/VISTARIL) tablet 25 mg  25 mg Oral TID PRN Armandina Stammer I, NP   25 mg at 11/03/17 0827  . magnesium hydroxide (MILK OF MAGNESIA) suspension 30 mL  30 mL Oral Daily PRN Charm Rings, NP      . traZODone (DESYREL) tablet 50 mg  50 mg Oral QHS PRN Charm Rings, NP   50 mg at 11/04/17 2158    Lab Results: No results found for this or any previous visit (from the past 48 hour(s)).  Blood Alcohol level:  Lab Results  Component Value Date   ETH <10 10/31/2017   ETH <5 01/27/2015    Metabolic Disorder Labs: Lab Results  Component Value Date   HGBA1C 5.5 11/02/2017   MPG 111.15 11/02/2017   No results found for: PROLACTIN Lab Results  Component Value Date   CHOL 179 07/27/2014   TRIG 49 07/27/2014   HDL 44 07/27/2014   CHOLHDL 5 06/21/2014   VLDL 10 07/27/2014   LDLCALC 125 (H) 07/27/2014   LDLCALC 100 (H) 06/21/2014    Physical Findings: AIMS: Facial and Oral Movements Muscles of Facial Expression: None, normal Lips and Perioral Area: None, normal Jaw: None, normal Tongue: None, normal,Extremity Movements Upper (arms, wrists, hands, fingers): None, normal Lower (legs, knees, ankles, toes): None, normal, Trunk Movements Neck, shoulders, hips: None, normal, Overall Severity Severity of abnormal movements (highest score from questions above): None, normal Incapacitation due to abnormal movements: None, normal Patient's awareness of abnormal movements (rate only patient's report): No Awareness, Dental Status Current problems with teeth and/or dentures?: No Does patient usually wear dentures?: No  CIWA:    COWS:     Musculoskeletal: Strength  & Muscle Tone: within normal limits Gait & Station: normal Patient leans: N/A  Psychiatric Specialty Exam: Physical Exam  Nursing note and vitals reviewed. Constitutional: She is oriented to person, place, and time. She appears well-developed and well-nourished.  HENT:  Head: Normocephalic.  Neck: Normal range of motion.  Respiratory: Effort normal.  Musculoskeletal: Normal range of motion.  Neurological: She is alert and oriented to person, place, and time.  Psychiatric: Her speech is normal and behavior is normal. Judgment and thought content normal. Her affect is blunt. Cognition and memory are normal.  Review of Systems  Constitutional: Negative for chills and fever.  Respiratory: Negative for cough and shortness of breath.   Cardiovascular: Negative for chest pain.  Gastrointestinal: Negative for abdominal pain, heartburn, nausea and vomiting.  Psychiatric/Behavioral: Negative for depression, hallucinations, substance abuse and suicidal ideas. The patient is nervous/anxious.     Blood pressure 125/83, pulse (!) 108, temperature 98.1 F (36.7 C), resp. rate 16, height 5\' 4"  (1.626 m), weight 58.1 kg (128 lb), SpO2 97 %.Body mass index is 21.97 kg/m.  General Appearance: Disheveled  Eye Contact:  Minimal  Speech:  Blocked  Volume:  Normal  Mood:  Anxious  Affect:  Flat  Thought Process:  Disorganized and Descriptions of Associations: Tangential  Orientation:  Full (Time, Place, and Person)  Thought Content:  Hallucinations: Auditory and Tangential  Suicidal Thoughts:  No  Homicidal Thoughts:  No  Memory:  Immediate;   Fair Recent;   Fair Remote;   Fair  Judgement:  Poor  Insight:  Lacking  Psychomotor Activity:  Normal  Concentration:  Concentration: Poor  Recall:  Poor  Fund of Knowledge:  Poor  Language:  Poor  Akathisia:  No  Handed:    AIMS (if indicated):     Assets:  Resilience  ADL's:  Intact  Cognition:  WNL  Sleep:  Number of Hours: 4   Treatment  Plan Summary: Daily contact with patient to assess and evaluate symptoms and progress in treatment and Medication management  -Continue inpatient hospitalization  - Schizoaffective disorder -Continue haldol 10mg  po qhs             - Continue Haldol Decanoate 100mg  IM q30 days (given on 11/06/17)  -EPS -Continuecogentin 0.5mg  po BID prn EPS  - Anxiety -Continueatarax 25mg  po TID prn anxiety  - Insomnia -Continuetrazodone 50mg  po qhs prn insomnia  - Agitation -Continuehaldol 5mg  po/IM q6h prn agitation -Continuebenadryl 50mg  IM q6h prn severe agitation  -Encourage participation in groups and therapeutic milieu  -Discharge planning will be ongoing  Nanine Means, NP 11/07/2017, 4:13 PM

## 2017-11-07 NOTE — [facility] Group Notes (Signed)
[facility] Group Notes:  (Nursing/MHT/Case Management/Adjunct)  Date:  11/07/2017  Time:  4:46 PM  Type of Therapy:  Psychoeducational Skills  Participation Level:  Did Not Attend    Shilpa Bushee O Blaire Palomino 11/07/2017, 4:46 PM 

## 2017-11-07 NOTE — [facility] Group Notes (Signed)
[facility] Group Notes: (Clinical Social Work)   11/07/2017      Type of Therapy:  Group Therapy   Participation Level:  Did Not Attend despite MHT prompting   Harun Brumley Grossman-Orr, LCSW 11/07/2017, 3:50 PM     

## 2017-11-07 NOTE — Progress Notes (Signed)
DAR NOTE: Patient presents with flat and depressed mood.  Denies pain, auditory and visual hallucinations.  Described energy level as normal and Rates depression at 0, hopelessness at 0, and anxiety at 0.  Maintained on routine safety checks.  Medications given as prescribed.  Support and encouragement offered as needed.  States goal for today is "discharge."  Patient remained in her room most of this shift.  Offered no complaint.

## 2017-11-07 NOTE — Progress Notes (Signed)
Pt did not attend group. 

## 2017-11-08 NOTE — [facility] Group Notes (Signed)
[facility] Group Notes: (Clinical Social Work)   11/08/2017      Type of Therapy:  Group Therapy   Participation Level:  Did Not Attend despite MHT prompting   Jashun Puertas Grossman-Orr, LCSW 11/08/2017, 1:10 PM     

## 2017-11-08 NOTE — Progress Notes (Signed)
Patient ID: [name], female   DOB: 10/14/1985, 32 y.o.   MRN: [id number]   [facility]  11/08/2017 10:07 AM [name]  MRN:  [id number] Subjective:   Viviano Simasansy Life is a 32 y/o F with history of schizoaffective disorder bipolar type who was admitted on IVC after she presented to the ED with worsening psychosis. Today, she is calm, cooperative, clear, and coherent.  She is not disorganized on assessment.  She reports she did have issues with sleep initiation but no issues once she was asleep; still tired this am and napping.  Pleasant but refusing her oral Haldol as she is convinced she does not need this since she had the injection.  Principal Problem: Schizoaffective disorder, bipolar type (HCC) Diagnosis:   Patient Active Problem List   Diagnosis Date Noted  . Schizoaffective disorder, bipolar type (HCC) [F25.0] 03/11/2012    Priority: High  . Schizophrenia, paranoid type (HCC) [F20.0] 07/14/2017  . Somatic dysfunction of pelvic region [M99.05] 10/26/2014  . Paranoia (HCC) [F22]    Total Time spent with patient: 30 minutes  Past Psychiatric History: see H&P  Past Medical History:  Past Medical History:  Diagnosis Date  . Bipolar 1 disorder (HCC) 2011  . Paranoia (HCC)   . Schizoaffective disorder (HCC)    History reviewed. No pertinent surgical history. Family History:  Family History  Problem Relation Age of Onset  . Cancer Father        lymphoma  . Hypertension Mother   . Diabetes Neg Hx   . Heart disease Neg Hx    Family Psychiatric  History: see H&P Social History:  Social History   Substance and Sexual Activity  Alcohol Use No   Comment: Pt denies      Social History   Substance and Sexual Activity  Drug Use No   Comment: Pt denies    Social History   Socioeconomic History  . Marital status: Single    Spouse name: None  . Number of children: 0  . Years of education: some colle  . Highest education level: None  Social Needs  . Financial  resource strain: None  . Food insecurity - worry: None  . Food insecurity - inability: None  . Transportation needs - medical: None  . Transportation needs - non-medical: None  Occupational History  . Occupation: [employer]     Comment: trying to start   Tobacco Use  . Smoking status: Never Smoker  . Smokeless tobacco: Never Used  Substance and Sexual Activity  . Alcohol use: No    Comment: Pt denies   . Drug use: No    Comment: Pt denies  . Sexual activity: No    Birth control/protection: Abstinence, None  Other Topics Concern  . None  Social History Narrative   Work or School: not working   Home Situation: lives with mother   Spiritual Beliefs: Christian   Lifestyle: no regular exercise; healthy diet         Additional Social History:                         Sleep: Poor  Appetite:  Fair  Current Medications: Current Facility-Administered Medications  Medication Dose Route Frequency Provider Last Rate Last Dose  . acetaminophen (TYLENOL) tablet 650 mg  650 mg Oral Q6H PRN Charm RingsLord, Lucas Exline Y, NP      . alum & mag hydroxide-simeth (MAALOX/MYLANTA) 200-200-20 MG/5ML suspension 30 mL  30 mL Oral Q4H PRN Charm Rings, NP      . benztropine (COGENTIN) tablet 1 mg  1 mg Oral QHS Armandina Stammer I, NP   1 mg at 11/04/17 2158  . diphenhydrAMINE (BENADRYL) injection 50 mg  50 mg Intramuscular Q6H PRN Micheal Likens, MD      . haloperidol (HALDOL) tablet 10 mg  10 mg Oral QHS Micheal Likens, MD      . haloperidol (HALDOL) tablet 5 mg  5 mg Oral Q6H PRN Micheal Likens, MD       Or  . haloperidol lactate (HALDOL) injection 5 mg  5 mg Intramuscular Q6H PRN Micheal Likens, MD      . haloperidol decanoate (HALDOL DECANOATE) 100 MG/ML injection 100 mg  100 mg Intramuscular Q30 days Jolyne Loa T, MD   100 mg at 11/05/17 1655  . hydrOXYzine (ATARAX/VISTARIL) tablet 25 mg  25 mg Oral TID PRN Armandina Stammer I, NP   25 mg at 11/03/17  0827  . magnesium hydroxide (MILK OF MAGNESIA) suspension 30 mL  30 mL Oral Daily PRN Charm Rings, NP      . traZODone (DESYREL) tablet 50 mg  50 mg Oral QHS PRN Charm Rings, NP   50 mg at 11/04/17 2158    Lab Results: No results found for this or any previous visit (from the past 48 hour(s)).  Blood Alcohol level:  Lab Results  Component Value Date   ETH <10 10/31/2017   ETH <5 01/27/2015    Metabolic Disorder Labs: Lab Results  Component Value Date   HGBA1C 5.5 11/02/2017   MPG 111.15 11/02/2017   No results found for: PROLACTIN Lab Results  Component Value Date   CHOL 179 07/27/2014   TRIG 49 07/27/2014   HDL 44 07/27/2014   CHOLHDL 5 06/21/2014   VLDL 10 07/27/2014   LDLCALC 125 (H) 07/27/2014   LDLCALC 100 (H) 06/21/2014    Physical Findings: AIMS: Facial and Oral Movements Muscles of Facial Expression: None, normal Lips and Perioral Area: None, normal Jaw: None, normal Tongue: None, normal,Extremity Movements Upper (arms, wrists, hands, fingers): None, normal Lower (legs, knees, ankles, toes): None, normal, Trunk Movements Neck, shoulders, hips: None, normal, Overall Severity Severity of abnormal movements (highest score from questions above): None, normal Incapacitation due to abnormal movements: None, normal Patient's awareness of abnormal movements (rate only patient's report): No Awareness, Dental Status Current problems with teeth and/or dentures?: No Does patient usually wear dentures?: No  CIWA:    COWS:     Musculoskeletal: Strength & Muscle Tone: within normal limits Gait & Station: normal Patient leans: N/A  Psychiatric Specialty Exam: Physical Exam  Nursing note and vitals reviewed. Constitutional: She is oriented to person, place, and time. She appears well-developed and well-nourished.  HENT:  Head: Normocephalic.  Neck: Normal range of motion.  Respiratory: Effort normal.  Musculoskeletal: Normal range of motion.   Neurological: She is alert and oriented to person, place, and time.  Psychiatric: Her speech is normal and behavior is normal. Judgment and thought content normal. Her affect is blunt. Cognition and memory are normal.    Review of Systems  Constitutional: Negative for chills and fever.  Respiratory: Negative for cough and shortness of breath.   Cardiovascular: Negative for chest pain.  Gastrointestinal: Negative for abdominal pain, heartburn, nausea and vomiting.  Psychiatric/Behavioral: Negative for depression, hallucinations, substance abuse and suicidal ideas. The patient is nervous/anxious.     Blood  pressure 102/72, pulse 95, temperature 98.1 F (36.7 C), temperature source Oral, resp. rate 18, height 5\' 4"  (1.626 m), weight 58.1 kg (128 lb), SpO2 97 %.Body mass index is 21.97 kg/m.  General Appearance: Casual  Eye Contact:  Good  Speech:  Normal  Volume:  Normal  Mood:  Anxious, mild  Affect:  Flat  Thought Process:  Clear and coherent  Orientation:  Full (Time, Place, and Person)  Thought Content:  WDL  Suicidal Thoughts:  No  Homicidal Thoughts:  No  Memory:  Immediate;   Fair Recent;   Fair Remote;   Fair  Judgement:  Fair  Insight:  Fair  Psychomotor Activity:  Normal  Concentration: Attention span and concentration are good  Recall:  [employer] of Knowledge:  Fair  Language:  Good  Akathisia:  No  Handed:    AIMS (if indicated):     Assets:  Resilience  ADL's:  Intact  Cognition:  WNL  Sleep:  7 hours    Treatment Plan Summary: Daily contact with patient to assess and evaluate symptoms and progress in treatment and Medication management  -Continue inpatient hospitalization  - Schizoaffective disorder -Continue haldol 10mg  po qhs             - Continue Haldol Decanoate 100mg  IM q30 days (given on 11/06/17)  -EPS -Continuecogentin 0.5mg  po BID prn EPS  - Anxiety -Continueatarax 25mg  po TID prn anxiety  -  Insomnia -Continuetrazodone 50mg  po qhs prn insomnia  - Agitation -Continuehaldol 5mg  po/IM q6h prn agitation -Continuebenadryl 50mg  IM q6h prn severe agitation  -Encourage participation in groups and therapeutic milieu  -Discharge planning will be ongoing  Nanine Means, NP 11/08/2017, 10:07 AM

## 2017-11-08 NOTE — Progress Notes (Signed)
Pt presents with a flat affect. Pt appears guarded, cautious and minimal during shift assessment. Pt noted to be withdrawn and isolative to her room. Pt denies AVH. Pt denies SI/HI. No concerns verbalized by pt. No inappropriate or bizarre behaviors noted. Orders reviewed. Verbal support provided. 15 minute checks performed for safety.

## 2017-11-09 NOTE — Progress Notes (Signed)
Pt presents with a flat affect and depressed mood. Pt appears withdrawn and is isolative to her room. Pt denies SI/HI. Pt denies AVH. Pt stated that she wanted to know when is she going home. Pt stated that her mother came to visit last night and is okay with her going home today. Per night shift RN, pt continues to refuse meds at bedtime. Writer made treatment aware. Orders reviewed. Verbal support provided. Pt encouraged to attend groups. 15 minute checks performed for safety.

## 2017-11-09 NOTE — Progress Notes (Signed)
Recreation Therapy Notes  Date: 3.18.19 Time: 10:00 a.m.  Location: [address] Dayroom   Group Topic: Personal Development: Coping Skills   Goal Area(s) Addresses:  Goal 1.1: To improve coping skills  - Group will increase awareness on coping skills  - Group will identify at least three healthy coping skills they have  - Group will be able to identify how coping skills can improve their wellness  Behavioral Response: Appropriate   Intervention: Craft  Activity: Coping Strategies Fortune Teller: Patients received a Coping Strategies Fortune Teller. Patients were given fifteen minutes to color and list their top 8 healthy coping strategies. Patients then cut out their fortune tellers and followed Recreation Therapy Intern instructions on how to assemble their fortune teller. Once put together, patients had the opportunity to practice their coping strategies by using their fortune tellers with a partner.   Education: Coping Skills   Education Outcome: Acknowledges Education  Clinical Observations/Feedback: Patient attended and participated appropriately during Recreation Therapy group tx. Patient was able to identify at least three healthy coping skills she has. Patient participatedd during introduction and closing discussion. Patient successfully met Goal 1.1 (See Above).  Ranell Patrick, Recreation Therapy Intern   Ranell Patrick 11/09/2017 1:05 PM

## 2017-11-09 NOTE — Progress Notes (Signed)
Patient ID: [name], female   DOB: [birth year], 32 [name].o.   MRN: [id number]   [facility] MD Progress Note  11/09/2017 6:39 PM [name]  MRN:  [id number]  Subjective: [name] reports, "[name] doing good. Am [name] going home today?".  [name] is a 32 [name]/o F with history of schizoaffective disorder bipolar type who was admitted on IVC after she presented to the ED with worsening psychosis. Today, she is calm, cooperative, clear, and coherent.  She is not disorganized on assessment.  She reports she did have issues with sleep initiation but no issues once she was asleep; still tired this am and napping.  Pleasant but refusing her oral Haldol as she is convinced she does not need this since she had the injection.  Principal Problem: Schizoaffective disorder, bipolar type (HCC) Diagnosis:   Patient Active Problem List   Diagnosis Date Noted  . Schizophrenia, paranoid type (HCC) [F20.0] 07/14/2017    Priority: High  . Somatic dysfunction of pelvic region [M99.05] 10/26/2014  . Paranoia (HCC) [F22]   . Schizoaffective disorder, bipolar type (HCC) [F25.0] 03/11/2012   Total Time spent with patient: 15 minutes  Past Psychiatric History: see H&P  Past Medical History:  Past Medical History:  Diagnosis Date  . Bipolar 1 disorder (HCC) 2011  . Paranoia (HCC)   . Schizoaffective disorder (HCC)    History reviewed. No pertinent surgical history. Family History:  Family History  Problem Relation Age of Onset  . Cancer Father        lymphoma  . Hypertension Mother   . Diabetes Neg Hx   . Heart disease Neg Hx    Family Psychiatric  History: see H&P Social History:  Social History   Substance and Sexual Activity  Alcohol Use No   Comment: Pt denies      Social History   Substance and Sexual Activity  Drug Use No   Comment: Pt denies    Social History   Socioeconomic History  . Marital status: Single    Spouse name: None  . Number of children: 0  . Years of education: some colle  .  Highest education level: None  Social Needs  . Financial resource strain: None  . Food insecurity - worry: None  . Food insecurity - inability: None  . Transportation needs - medical: None  . Transportation needs - non-medical: None  Occupational History  . Occupation: [name]     Comment: trying to start   Tobacco Use  . Smoking status: Never Smoker  . Smokeless tobacco: Never Used  Substance and Sexual Activity  . Alcohol use: No    Comment: Pt denies   . Drug use: No    Comment: Pt denies  . Sexual activity: No    Birth control/protection: Abstinence, None  Other Topics Concern  . None  Social History Narrative   Work or School: not working   Home Situation: lives with mother   Spiritual Beliefs: Christian   Lifestyle: no regular exercise; healthy diet         Additional Social History:   Sleep: Fair  Appetite:  Fair  Current Medications: Current Facility-Administered Medications  Medication Dose Route Frequency Provider Last Rate Last Dose  . acetaminophen (TYLENOL) tablet 650 mg  650 mg Oral Q6H PRN [name], [name]      . alum & mag hydroxide-simeth (MAALOX/MYLANTA) 200-200-20 MG/5ML suspension 30 mL  30 mL Oral Q4H PRN [name], [name]      .  benztropine (COGENTIN) tablet 1 mg  1 mg Oral QHS [name], [name]   1 mg at 11/04/17 2158  . diphenhydrAMINE (BENADRYL) injection 50 mg  50 mg Intramuscular Q6H PRN [name], Monique T, MD      . haloperidol (HALDOL) tablet 10 mg  10 mg Oral QHS [name], Monique T, MD      . haloperidol (HALDOL) tablet 5 mg  5 mg Oral Q6H PRN [name], Monique T, MD       Or  . haloperidol lactate (HALDOL) injection 5 mg  5 mg Intramuscular Q6H PRN [name], Monique T, MD      . haloperidol decanoate (HALDOL DECANOATE) 100 MG/ML injection 100 mg  100 mg Intramuscular Q30 days [name], Monique T, MD   100 mg at 11/05/17 1655  . hydrOXYzine (ATARAX/VISTARIL) tablet 25 mg  25 mg Oral TID PRN [name], [name],  [name]   25 mg at 11/03/17 0827  . magnesium hydroxide (MILK OF MAGNESIA) suspension 30 mL  30 mL Oral Daily PRN [name], [name], [name]      . traZODone (DESYREL) tablet 50 mg  50 mg Oral QHS PRN [name], [name], [name]   50 mg at 11/04/17 2158   Lab Results: No results found for this or any previous visit (from the past 48 hour(s)).  Blood Alcohol level:  Lab Results  Component Value Date   ETH <10 10/31/2017   ETH <5 01/27/2015   Metabolic Disorder Labs: Lab Results  Component Value Date   HGBA1C 5.5 11/02/2017   MPG 111.15 11/02/2017   No results found for: PROLACTIN Lab Results  Component Value Date   CHOL 179 07/27/2014   TRIG 49 07/27/2014   HDL 44 07/27/2014   CHOLHDL 5 06/21/2014   VLDL 10 07/27/2014   LDLCALC 125 (H) 07/27/2014   LDLCALC 100 (H) 06/21/2014   Physical Findings: AIMS: Facial and Oral Movements Muscles of Facial Expression: None, normal Lips and Perioral Area: None, normal Jaw: None, normal Tongue: None, normal,Extremity Movements Upper (arms, wrists, hands, fingers): None, normal Lower (legs, knees, ankles, toes): None, normal, Trunk Movements Neck, shoulders, hips: None, normal, Overall Severity Severity of abnormal movements (highest score from questions above): None, normal Incapacitation due to abnormal movements: None, normal Patient's awareness of abnormal movements (rate only patient's report): No Awareness, Dental Status Current problems with teeth and/or dentures?: No Does patient usually wear dentures?: No  CIWA:    COWS:     Musculoskeletal: Strength & Muscle Tone: within normal limits Gait & Station: normal Patient leans: N/A  Psychiatric Specialty Exam: Physical Exam  Nursing note and vitals reviewed. Constitutional: She is oriented to person, place, and time. She appears well-developed and well-nourished.  HENT:  Head: Normocephalic.  Neck: Normal range of motion.  Respiratory: Effort normal.  Musculoskeletal: Normal range of  motion.  Neurological: She is alert and oriented to person, place, and time.  Psychiatric: Her speech is normal and behavior is normal. Judgment and thought content normal. Her affect is blunt. Cognition and memory are normal.    Review of Systems  Constitutional: Negative for chills and fever.  Respiratory: Negative for cough and shortness of breath.   Cardiovascular: Negative for chest pain.  Gastrointestinal: Negative for abdominal pain, heartburn, nausea and vomiting.  Psychiatric/Behavioral: Negative for depression, hallucinations, substance abuse and suicidal ideas. The patient is nervous/anxious.     Blood pressure 121/70, pulse 89, temperature 97.8 F (36.6 C), temperature source Oral, resp. rate 18, height 5\' 4"  (  1.626 m), weight 58.1 kg (128 lb), SpO2 97 %.Body mass index is 21.97 kg/m.  General Appearance: Casual  Eye Contact:  Good  Speech:  Normal  Volume:  Normal  Mood:  Anxious, mild  Affect:  Flat  Thought Process:  Clear and coherent  Orientation:  Full (Time, Place, and Person)  Thought Content:  WDL  Suicidal Thoughts:  No  Homicidal Thoughts:  No  Memory:  Immediate;   Fair Recent;   Fair Remote;   Fair  Judgement:  Fair  Insight:  Fair  Psychomotor Activity:  Normal  Concentration: Attention span and concentration are good  Recall:  [employer] of Knowledge:  Fair  Language:  Good  Akathisia:  No  Handed:    AIMS (if indicated):     Assets:  Resilience  ADL's:  Intact  Cognition:  WNL  Sleep:  7 hours    Treatment Plan Summary: Daily contact with patient to assess and evaluate symptoms and progress in treatment and Medication management  -Continue inpatient hospitalization  - Schizoaffective disorder -Continue haldol 10mg  po qhs             - Continue Haldol Decanoate 100mg  IM q30 days (given on 11/06/17)  -EPS -Continuecogentin 0.5mg  po BID prn EPS  - Anxiety -Continueatarax 25mg  po TID prn  anxiety  - Insomnia -Continuetrazodone 50mg  po qhs prn insomnia  - Agitation -Continuehaldol 5mg  po/IM q6h prn agitation -Continuebenadryl 50mg  IM q6h prn severe agitation  -Encourage participation in groups and therapeutic milieu  -Discharge planning will be ongoing  Monique Stammer, [name], PMHNP, FNP-BC. 11/09/2017, 6:39 PM Patient ID: [name], female   DOB: 1985-09-14, 42 [name].o.   MRN: [id number]

## 2017-11-09 NOTE — [facility] Group Notes (Signed)
LCSW Group Therapy Note   11/09/2017 1:15pm   Type of Therapy and Topic:  Group Therapy:  Overcoming Obstacles   Participation Level:  Minimal   Description of Group:    In this group patients will be encouraged to explore what they see as obstacles to their own wellness and recovery. They will be guided to discuss their thoughts, feelings, and behaviors related to these obstacles. The group will process together ways to cope with barriers, with attention given to specific choices patients can make. Each patient will be challenged to identify changes they are motivated to make in order to overcome their obstacles. This group will be process-oriented, with patients participating in exploration of their own experiences as well as giving and receiving support and challenge from other group members.   Therapeutic Goals: 1. Patient will identify personal and current obstacles as they relate to admission. 2. Patient will identify barriers that currently interfere with their wellness or overcoming obstacles.  3. Patient will identify feelings, thought process and behaviors related to these barriers. 4. Patient will identify two changes they are willing to make to overcome these obstacles:      Summary of Patient Progress   Stayed the entire time, engaged throughout.  "I wish someone would reward me for my motivation and accomplishment."  Was challenged by others to reward herself if no one else is doing it. When asked how she would reward herself, she replied "movie and a donut."  Vague, but entertaining.   Therapeutic Modalities:   Cognitive Behavioral Therapy Solution Focused Therapy Motivational Interviewing Relapse Prevention Therapy  Ida RogueRodney B Minnette Merida, LCSW 11/09/2017 3:43 PM

## 2017-11-09 NOTE — Progress Notes (Signed)
Adult Psychoeducational Group Note  Date:  11/09/2017 Time:  1600  Group Topic/Focus:  Goals Group:   The focus of this group is to help patients establish daily goals to achieve during treatment and discuss how the patient can incorporate goal setting into their daily lives to aide in recovery.  Participation Level:  Active  Participation Quality:  Appropriate  Affect:  Appropriate  Cognitive:  Appropriate  Insight: Appropriate  Engagement in Group:  Engaged  Modes of Intervention:  Discussion  Additional Comments:  Goal was to work on discharge planning.  Hamna Asa L  

## 2017-11-09 NOTE — Progress Notes (Signed)
Psychoeducational Group Note  Date:  11/09/2017 Time:    Group Topic/Focus:  Goals Group:   The focus of this group is to help patients establish daily goals to achieve during treatment and discuss how the patient can incorporate goal setting into their daily lives to aide in recovery.  Participation Level: Did Not Attend  Participation Quality:  Not Applicable  Affect:  Not Applicable  Cognitive:  Not Applicable  Insight:  Not Applicable  Engagement in Group: Not Applicable  Additional Comments:  Pt refused to attend the Goals Group this morning.  [name] E 11/09/2017, 9:18 AM

## 2017-11-10 MED ORDER — BENZTROPINE MESYLATE 1 MG PO TABS: 1.0000 mg | ORAL_TABLET | Freq: Every day | ORAL | 0 refills | Status: DC

## 2017-11-10 MED ORDER — HALOPERIDOL DECANOATE 100 MG/ML IM SOLN: 100.0000 mg | INTRAMUSCULAR | 0 refills | Status: DC

## 2017-11-10 MED ORDER — HALOPERIDOL 10 MG PO TABS: 10.0000 mg | ORAL_TABLET | Freq: Every day | ORAL | 0 refills | Status: DC

## 2017-11-10 MED ORDER — HYDROXYZINE HCL 25 MG PO TABS: 25.0000 mg | ORAL_TABLET | Freq: Three times a day (TID) | ORAL | 0 refills | Status: DC | PRN

## 2017-11-10 MED ORDER — TRAZODONE HCL 50 MG PO TABS: 50.0000 mg | ORAL_TABLET | Freq: Every evening | ORAL | 0 refills | Status: DC | PRN

## 2017-11-10 NOTE — Progress Notes (Signed)
  D: Pt was reluctant to take her medications. However, the writer continued to talk to her in an attempt to divert from the medications. Writer observed pt take the pill out of the cup and hold it in her hand. Pt made a gesture as if to swallow the medication. Stating, "It's hard to get it down". Writer reminded pt that the medication was still in her hand. Pt stated, "I know, I'm just getting ready". Pt has no other questions or concerns.    A:  Support and encouragement was offered. 15 min checks continued for safety.  R: Pt remains safe.

## 2017-11-10 NOTE — Discharge Summary (Addendum)
Physician Discharge Summary Note  Patient:  [name] is an 32 y.o., female  MRN:  [id number]  DOB:  [birth year]  Patient phone:  [phone] (home)   Patient address:   [address] [place] [address],   Total Time spent with patient: Greater than 30 minutes  Date of Admission:  11/02/2017 Date of Discharge: 03--19-19  Reason for Admission:   Principal Problem: Schizoaffective disorder, bipolar type [facility])  Discharge Diagnoses: Patient Active Problem List   Diagnosis Date Noted  . Schizophrenia, paranoid type (HCC) [F20.0] 07/14/2017    Priority: High  . Somatic dysfunction of pelvic region [M99.05] 10/26/2014  . Paranoia (HCC) [F22]   . Schizoaffective disorder, bipolar type (HCC) [F25.0] 03/11/2012   Past Psychiatric History: Schizophrenia  Past Medical History:  Past Medical History:  Diagnosis Date  . Bipolar 1 disorder (HCC) 2011  . Paranoia (HCC)   . Schizoaffective disorder (HCC)    History reviewed. No pertinent surgical history.  Family History:  Family History  Problem Relation Age of Onset  . Cancer Father        lymphoma  . Hypertension Mother   . Diabetes Neg Hx   . Heart disease Neg Hx    Family Psychiatric  History: See H&P  Social History:  Social History   Substance and Sexual Activity  Alcohol Use No   Comment: Pt denies      Social History   Substance and Sexual Activity  Drug Use No   Comment: Pt denies    Social History   Socioeconomic History  . Marital status: Single    Spouse name: None  . Number of children: 0  . Years of education: some colle  . Highest education level: None  Social Needs  . Financial resource strain: None  . Food insecurity - worry: None  . Food insecurity - inability: None  . Transportation needs - medical: None  . Transportation needs - non-medical: None  Occupational History  . Occupation: [name]     Comment: trying to start   Tobacco Use  . Smoking status: Never Smoker  .  Smokeless tobacco: Never Used  Substance and Sexual Activity  . Alcohol use: No    Comment: Pt denies   . Drug use: No    Comment: Pt denies  . Sexual activity: No    Birth control/protection: Abstinence, None  Other Topics Concern  . None  Social History Narrative   Work or School: not working   Home Situation: lives with mother   Spiritual Beliefs: Christian   Lifestyle: no regular exercise; healthy diet         Hospital Course: (Per Md's SRA): Kenyia Surratt is a 31 y/o F with history of schizoaffective disorder bipolar type who was admitted on IVC after she presented to the ED with worsening psychosis. As per pt's mother, pt had been disorganized, non-adherent to her home medication regimen, paranoid, and responding to internal stimuli. Pt refused to be resumed on Invega in the ED, and she was given IM zyprexawhile in the ED.Upon initial presentation, pt continued to refuse Invega, and so she was started on haldol oral form, and dose was titrated up during her stay, and yesterday it was consolidated to bedtime to reduce daytime fatigue. She also was started on haldol decanoate due to poor adherence to her outpatient regimen after previous hospitalizations, and she had incremental improvement of her presenting   This is one of several discharge summaries for  Mishika, who is known on this unit from previous admissions & mood stabilization treatments. She was recently discharged from this hospital with plans & appointment to follow-up care on an outpatient basis. She  was re-admitted to the [facility] adult unit for crisis management due to worsening psychosis & disorganized behavior. Patient has hx of Bipolar affective disorder. She presented on admission generally disorganized, psychotic, tangential & bizarre. She was a poor historian overall, having difficulty providing a narrative of the events prior to hospitalization.She was in need of mood stabilization treatments.   After evaluation of her  presenting symptoms, Stamatia was started on the  medication regimen targeting those presenting symptoms. Their indications & side effects were explained to her although she seemed to have difficulties comprehending a lot of information at the time. She received & was discharged on; Cogentin 1 mg for EPS, Hydroxyzine 25 mg prn for anxiety, Haldol tablets 10 mg Q hs for 14 days, Haldol decanoate 100 mg/ ml Q 30 days all for mood control & Trazodone 50 mg for insomnia. She presented no other significant pre-existing medical problems that required treatments. She tolerated her treatment regimen without any adverse effects or reactions reported.  During the course of her hosptalization, Janyra's improvement was monitored by observation & her daily report of gradual symptom reduction noted. Her emotional & mental status were monitored by daily self-inventory reports completed by her & the clinical staff. She reported continued improvement on daily basis & denied any new concerns. She was enrolled & encouraged to attend group seesions to help with recognizing triggers of her emotional crises & ways to cope better with them.         Jalila was evaluated by the treatment team on daily basis for mood stability and plans for continued recovery after discharge. She was offered further treatment options upon discharge on an outpatient basis as noted below. She was encouraged to maintain satisfactory support network and home environment as this will aid in maintaining mood stability. She was instructed & encouraged to adhere to her medication regimen as recommended by her treatment team.    Aviyah was seen this morning by the attending psychiatrist. Her reason for admission, treatment plans & response to treatment discussed. She endorsed that she is doing well & ready to be discharged to continue mental health care on an outpatient basis as noted below. She was provided with all the necessary information needed to make this  appointment without problems.   Upon discharge, Dianey was both mentally and medically stable denying suicidal/homicidal ideation, auditory/visual/tactile hallucinations, delusional thoughts and paranoia. She left [facility] with all personal belongings in no distress.  Transportation per mother.   Physical Findings: AIMS: Facial and Oral Movements Muscles of Facial Expression: None, normal Lips and Perioral Area: None, normal Jaw: None, normal Tongue: None, normal,Extremity Movements Upper (arms, wrists, hands, fingers): None, normal Lower (legs, knees, ankles, toes): None, normal, Trunk Movements Neck, shoulders, hips: None, normal, Overall Severity Severity of abnormal movements (highest score from questions above): None, normal Incapacitation due to abnormal movements: None, normal Patient's awareness of abnormal movements (rate only patient's report): No Awareness, Dental Status Current problems with teeth and/or dentures?: No Does patient usually wear dentures?: No  CIWA:    COWS:     Musculoskeletal: Strength & Muscle Tone: within normal limits Gait & Station: normal Patient leans: N/A  Psychiatric Specialty Exam: Physical Exam  Constitutional: She appears well-developed.  HENT:  Head: Normocephalic.  Eyes: Pupils are equal, round, and reactive  to light.  Neck: Normal range of motion.  Cardiovascular: Normal rate.  Respiratory: Effort normal.  GI: Soft.  Genitourinary:  Genitourinary Comments: Deferred  Musculoskeletal: Normal range of motion.  Neurological: She is alert.  Skin: Skin is warm.    Review of Systems  Constitutional: Negative.   HENT: Negative.   Eyes: Negative.   Respiratory: Negative.   Cardiovascular: Negative.   Gastrointestinal: Negative.   Genitourinary: Negative.   Musculoskeletal: Negative.   Skin: Negative.   Neurological: Negative.   Endo/Heme/Allergies: Negative.   Psychiatric/Behavioral: Positive for depression (Stabilized with medication  priro to discharge) and hallucinations (Hx. psychosis). Negative for memory loss, substance abuse and suicidal ideas. The patient has insomnia (Stabilized with medication prior to discharge). The patient is not nervous/anxious.     Blood pressure 102/68, pulse 98, temperature 97.9 F (36.6 C), temperature source Oral, resp. rate 16, height 5\' 4"  (1.626 m), weight 58.1 kg (128 lb), SpO2 97 %.Body mass index is 21.97 kg/m.  See Md's SRA   Have you used any form of tobacco in the last 30 days? (Cigarettes, Smokeless Tobacco, Cigars, and/or Pipes): No  Has this patient used any form of tobacco in the last 30 days? (Cigarettes, Smokeless Tobacco, Cigars, and/or Pipes):  N/A  Blood Alcohol level:  Lab Results  Component Value Date   ETH <10 10/31/2017   ETH <5 01/27/2015   Metabolic Disorder Labs:  Lab Results  Component Value Date   HGBA1C 5.5 11/02/2017   MPG 111.15 11/02/2017   No results found for: PROLACTIN Lab Results  Component Value Date   CHOL 179 07/27/2014   TRIG 49 07/27/2014   HDL 44 07/27/2014   CHOLHDL 5 06/21/2014   VLDL 10 07/27/2014   LDLCALC 125 (H) 07/27/2014   LDLCALC 100 (H) 06/21/2014   See Psychiatric Specialty Exam and Suicide Risk Assessment completed by Attending Physician prior to discharge.  Discharge destination:  Home  Is patient on multiple antipsychotic therapies at discharge:  No   Has Patient had three or more failed trials of antipsychotic monotherapy by history:  No  Recommended Plan for Multiple Antipsychotic Therapies: NA  Allergies as of 11/10/2017      Reactions   Quetiapine Other (See Comments)   Facial skin irritation   Yes For Women [intimacy Products] Rash      Medication List    STOP taking these medications   paliperidone 234 MG/1.5ML Susp injection Commonly known as:  INVEGA SUSTENNA   paliperidone 6 MG 24 hr tablet Commonly known as:  INVEGA     TAKE these medications     Indication  benztropine 1 MG  tablet Commonly known as:  COGENTIN Take 1 tablet (1 mg total) by mouth at bedtime. For prevention of drug induced tremors What changed:    medication strength  how much to take  when to take this  reasons to take this  additional instructions  Indication:  Extrapyramidal Reaction caused by Medications   haloperidol 10 MG tablet Commonly known as:  HALDOL Take 1 tablet (10 mg total) by mouth at bedtime. For mood control  Indication:  Mood control   haloperidol decanoate 100 MG/ML injection Commonly known as:  HALDOL DECANOATE Inject 1 mL (100 mg total) into the muscle every 30 (thirty) days. (Due 12-05-17): For mood control Start taking on:  12/05/2017  Indication:  Mood control   hydrOXYzine 25 MG tablet Commonly known as:  ATARAX/VISTARIL Take 1 tablet (25 mg total) by mouth 3 (three)  times daily as needed for anxiety.  Indication:  Feeling Anxious   traZODone 50 MG tablet Commonly known as:  DESYREL Take 1 tablet (50 mg total) by mouth at bedtime as needed for sleep.  Indication:  Trouble Sleeping      Follow-up Information    [facility] Follow up on 11/12/2017.   Why:  Thursday at [facility] for your hospital follow up appointment. Bring along ID, insurance card, hospital d/c paperwork Contact information: [address] [place] [address] [phone]          Follow-up recommendations: Activity:  As tolerated Diet: As recommended by your primary care doctor. Keep all scheduled follow-up appointments as recommended.  Comments: Patient is instructed prior to discharge to: Take all medications as prescribed by his/her mental healthcare provider. Report any adverse effects and or reactions from the medicines to his/her outpatient provider promptly. Patient has been instructed & cautioned: To not engage in alcohol and or illegal drug use while on prescription medicines. In the event of worsening symptoms, patient is instructed to call the crisis hotline, 911 and or  go to the nearest ED for appropriate evaluation and treatment of symptoms. To follow-up with his/her primary care provider for your other medical issues, concerns and or health care needs.   Signed: Armandina StammerAgnes Nwoko, NP, PMHNP, FNP-BC 11/10/2017, 10:44 AM   Patient seen, Suicide Assessment Completed.  Disposition Plan Reviewed

## 2017-11-10 NOTE — [facility] Suicide Risk Assessment (Signed)
[facility] INPATIENT:  Family/Significant Other Suicide Prevention Education  Suicide Prevention Education:  Education Completed; No one has been identified by the patient as the family member/significant other with whom the patient will be residing, and identified as the person(s) who will aid the patient in the event of a mental health crisis (suicidal ideations/suicide attempt).  With written consent from the patient, the family member/significant other has been provided the following suicide prevention education, prior to the and/or following the discharge of the patient.  The suicide prevention education provided includes the following:  Suicide risk factors  Suicide prevention and interventions  National Suicide Hotline telephone number  [facility] assessment telephone number  [facility] Emergency Assistance 911  [facility] and/or Residential Mobile Crisis Unit telephone number  Request made of family/significant other to:  Remove weapons (e.g., guns, rifles, knives), all items previously/currently identified as safety concern.    Remove drugs/medications (over-the-counter, prescriptions, illicit drugs), all items previously/currently identified as a safety concern.  The family member/significant other verbalizes understanding of the suicide prevention education information provided.  The family member/significant other agrees to remove the items of safety concern listed above. The patient did not endorse SI at the time of admission, nor did the patient c/o SI during the stay here.  SPE not required.  However, I did talk to mother, Camie PatienceSylvia Sandler, [phone], and we went over treatment team recommendations and crises plan Ida RogueRodney B Caterin Tabares 11/10/2017, 10:39 AM

## 2017-11-10 NOTE — Plan of Care (Signed)
Patient attended and participated appropriately during recreation therapy group treatment successfully engaging in group with a calm and appropriate mood at least 2x

## 2017-11-10 NOTE — [facility] Suicide Risk Assessment (Signed)
[facility] Discharge Suicide Risk Assessment   Principal Problem: Schizoaffective disorder, bipolar type [name]) Discharge Diagnoses:  Patient Active Problem List   Diagnosis Date Noted  . Schizophrenia, paranoid type (HCC) [F20.0] 07/14/2017  . Somatic dysfunction of pelvic region [M99.05] 10/26/2014  . Paranoia (HCC) [F22]   . Schizoaffective disorder, bipolar type (HCC) [F25.0] 03/11/2012    Total Time spent with patient: 30 minutes  Musculoskeletal: Strength & Muscle Tone: within normal limits Gait & Station: normal Patient leans: N/A  Psychiatric Specialty Exam: Review of Systems  Constitutional: Negative for chills and fever.  Respiratory: Negative for cough and shortness of breath.   Cardiovascular: Negative for chest pain.  Gastrointestinal: Negative for abdominal pain, heartburn, nausea and vomiting.  Psychiatric/Behavioral: Negative for depression, hallucinations and suicidal ideas. The patient is not nervous/anxious.     Blood pressure 102/68, pulse 98, temperature 97.9 F (36.6 C), temperature source Oral, resp. rate 16, height 5\' 4"  (1.626 m), weight 58.1 kg (128 lb), SpO2 97 %.Body mass index is 21.97 kg/m.  General Appearance: Casual and Fairly Groomed  Patent attorney::  Good  Speech:  Clear and Coherent and Normal Rate  Volume:  Normal  Mood:  Euthymic  Affect:  Blunt and Congruent  Thought Process:  Coherent and Goal Directed  Orientation:  Full (Time, Place, and Person)  Thought Content:  Logical  Suicidal Thoughts:  No  Homicidal Thoughts:  No  Memory:  Immediate;   Good Recent;   Good Remote;   Good  Judgement:  Fair  Insight:  Fair  Psychomotor Activity:  Normal  Concentration:  Good  Recall:  Good  Fund of Knowledge:Fair  Language: Fair  Akathisia:  No  Handed:    AIMS (if indicated):     Assets:  Communication Skills Resilience  Sleep:  Number of Hours: 2.5  Cognition: WNL  ADL's:  Intact   Mental Status Per Nursing Assessment::   On Admission:   NA  Demographic Factors:  Low socioeconomic status  Loss Factors: NA  Historical Factors: Impulsivity  Risk Reduction Factors:   Living with another person, especially a relative, Positive social support, Positive therapeutic relationship and Positive coping skills or problem solving skills  Continued Clinical Symptoms:  Schizophrenia:   Paranoid or undifferentiated type  Cognitive Features That Contribute To Risk:  None    Suicide Risk:  Minimal: No identifiable suicidal ideation.  Patients presenting with no risk factors but with morbid ruminations; may be classified as minimal risk based on the severity of the depressive symptoms  Follow-up Information    [name] Follow up on 11/12/2017.   Why:  Thursday at [facility] for your hospital follow up appointment. Bring along ID, insurance card, hospital d/c paperwork Contact information: [address] [name] [place] [address] [phone]         Subjective Data:  [name] is a 32 y/o F with history of schizoaffective disorder bipolar type who was admitted on IVC after she presented to the ED with worsening psychosis. As per pt's mother, pt had been disorganized, non-adherent to her home medication regimen, paranoid, and responding to internal stimuli. Pt refused to be resumed on Invega in the ED, and she was given IM zyprexawhile in the ED.Upon initial presentation, pt continued to refuse Invega, and so she was started on haldol oral form, and dose was titrated up during her stay, and yesterday it was consolidated to bedtime to reduce daytime fatigue. She also was started on haldol decanoate due to poor adherence to  her outpatient regimen after previous hospitalizations, and she had incremental improvement of her presenting symptoms.  Today upon evaluation, pt shares, "I'm good - I'm just still sleepy this morning." Pt denies SI/HI/AH/VH. She denies physical complaints. Her appetite is good. She is sleeping well. She is in agreement  to continue her current medications without changes including injection of haldol decanoate. She was able to engage in safety planning including [facility] or contact emergency services if she feels unable to maintain her own safety or the safety of others. She plans to return to staying with her mother after discharge. Pt had no further questions, comments, or concerns.   Plan Of Care/Follow-up recommendations:   -Discharge to outpatient level of care  - Schizoaffective disorder -Continue haldol 10mg  po qhs - Continue Haldol Decanoate 100mg  IM q30 days (given on 11/06/17)  -EPS -Continuecogentin 0.5mg  po BID prn EPS  - Anxiety -Continueatarax 25mg  po TID prn anxiety  - Insomnia -Continuetrazodone 50mg  po qhs prn insomnia  Activity:  as tolerated Diet:  normal Tests:  NA Other:  see above for DC plan  [name], MD 11/10/2017, 10:58 AM

## 2017-11-10 NOTE — Progress Notes (Signed)
Recreation Therapy Notes  INPATIENT RECREATION TR PLAN  Patient Details Name: [name] MRN: [id number] DOB: August 30, 1985 Today's Date: 11/10/2017  Rec Therapy Plan Is patient appropriate for Therapeutic Recreation?: Yes Treatment times per week: At least three  Estimated Length of Stay: 5-7 days  TR Treatment/Interventions: Group participation (Appropriate participation in Recreation Therapy group tx.)  Discharge Criteria Pt will be discharged from therapy if:: Discharged Treatment plan/goals/alternatives discussed and agreed upon by:: Patient/family  Discharge Summary Short term goals set: See Care Plan  Short term goals met: Complete Progress toward goals comments: Groups attended Which groups?: Self-esteem, Stress management, Communication, Coping skills, Healthy Support System  Therapeutic equipment acquired: None  Reason patient discharged from therapy: Discharge from hospital Pt/family agrees with progress & goals achieved: Yes Date patient discharged from therapy: 11/10/17  Ranell Patrick, Recreation Therapy Intern   Ranell Patrick 11/10/2017, 2:52 PM

## 2017-11-10 NOTE — Progress Notes (Signed)
  [facility] Discharge [facility] :  Will you be returning to the same living situation after discharge:  Yes,  home At discharge, do you have transportation home?: Yes,  mother Do you have the ability to pay for your medications: Yes,  MCD  Release of information consent forms completed and in the chart;  Patient's signature needed at discharge.  Patient to Follow up at: Follow-up Information    [facility] Follow up on 11/12/2017.   Why:  Thursday at [facility] for your hospital follow up appointment. Bring along ID, insurance card, hospital d/c paperwork Contact information: [address] [place] [address] [phone]           Next level of care provider has [facility] to [facility] Link:no  Safety Planning and Suicide Prevention discussed: Yes,  yes  Have you used any form of tobacco in the last 30 days? (Cigarettes, Smokeless Tobacco, Cigars, and/or Pipes): No  Has patient been referred to the Quitline?: N/A patient is not a smoker  Patient has been referred for addiction treatment: N/A  Ida RogueRodney B Rosanne Wohlfarth, LCSW 11/10/2017, 10:41 AM

## 2017-11-10 NOTE — Progress Notes (Addendum)
Recreation Therapy Notes  Date: 3.19.19 Time: 10:00 a.m.  Location: [address] Dayroom   Group Topic: Self-Expression, PharmacologistCoping Skills, Leisure Education   Goal Area(s) Addresses:  - Patient will identify the importance of self-expression  - Patient will identify how to express themselves  - Patient will identify how art can be used as a Associate Professorcoping skill  - Patient will report enjoyment and feeling of relaxation from activity  - Patient will participate in Recreation Therapy group treatment   Intervention: Art   Activity: Expressive Arts: Patients had the opportunity to express themselves through the use of art by painting their feelings or thoughts on a canvas   Education: Self-Expression, PharmacologistCoping Skills, Leisure Education, Stress Management   Education Outcome: Acknowledges education  Clinical Observations/Feedback: Patient did not attend   Sheryle HailDarian Jamala Kohen, Recreation Therapy Intern  Sheryle HailDarian Haim Hansson 11/10/2017 12:08 PM

## 2017-11-10 NOTE — Progress Notes (Signed)
Pt received both written and verbal discharge instructions. Pt verbalized understanding of discharge instructions. Pt agreed to f/u appt and med regimen. Pt received d/c packet and prescriptions. Pt gathered belongings from room and locker. Pt safely discharged to the lobby and picked up by her mother.

## 2017-11-10 NOTE — Tx Team (Signed)
Interdisciplinary Treatment and Diagnostic Plan Update  11/10/2017 Time of Session: 10:42 AM  [name] MRN: [id number]  Principal Diagnosis: Schizoaffective disorder, bipolar type [facility])  Secondary Diagnoses: Principal Problem:   Schizoaffective disorder, bipolar type ([place])   Current Medications:  Current Facility-Administered Medications  Medication Dose Route Frequency Provider Last Rate Last Dose  . acetaminophen (TYLENOL) tablet 650 mg  650 mg Oral Q6H PRN [name], NP      . alum & mag hydroxide-simeth (MAALOX/MYLANTA) 200-200-20 MG/5ML suspension 30 mL  30 mL Oral Q4H PRN [name], NP      . benztropine (COGENTIN) tablet 1 mg  1 mg Oral QHS [name], NP   1 mg at 11/09/17 2143  . diphenhydrAMINE (BENADRYL) injection 50 mg  50 mg Intramuscular Q6H PRN [name], MD      . haloperidol (HALDOL) tablet 10 mg  10 mg Oral QHS [name], MD   10 mg at 11/09/17 2143  . haloperidol (HALDOL) tablet 5 mg  5 mg Oral Q6H PRN [name], MD       Or  . haloperidol lactate (HALDOL) injection 5 mg  5 mg Intramuscular Q6H PRN [name], MD      . haloperidol decanoate (HALDOL DECANOATE) 100 MG/ML injection 100 mg  100 mg Intramuscular Q30 days [name], MD   100 mg at 11/05/17 1655  . hydrOXYzine (ATARAX/VISTARIL) tablet 25 mg  25 mg Oral TID PRN [name], NP   25 mg at 11/03/17 0827  . magnesium hydroxide (MILK OF MAGNESIA) suspension 30 mL  30 mL Oral Daily PRN [name], NP      . traZODone (DESYREL) tablet 50 mg  50 mg Oral QHS PRN [name], NP   50 mg at 11/04/17 2158    PTA Medications: Medications Prior to Admission  Medication Sig Dispense Refill Last Dose  . benztropine (COGENTIN) 0.5 MG tablet Take 1 tablet (0.5 mg total) by mouth 2 (two) times daily as needed (EPS). 60 tablet 0   . hydrOXYzine (ATARAX/VISTARIL) 25 MG tablet Take 1 tablet (25 mg total) by mouth 3 (three)  times daily as needed for anxiety. 60 tablet 0   . paliperidone (INVEGA SUSTENNA) 234 MG/1.5ML SUSP injection Inject 117 mg into the muscle once for 1 dose. (Due 07-28-17): For mood control (Patient not taking: Reported on 10/31/2017) 0.75 mL 0 Not Taking at Unknown time  . paliperidone (INVEGA SUSTENNA) 234 MG/1.5ML SUSP injection Inject 117 mg into the muscle every 30 (thirty) days. (Due on 08-28-17): For mood control 0.9 mL 0   . paliperidone (INVEGA) 6 MG 24 hr tablet Take 1 tablet (6 mg) in the morning & 1 tablet (6 mg) at bedtime: For mood control (Patient not taking: Reported on 10/31/2017) 30 tablet 0 Not Taking at Unknown time  . traZODone (DESYREL) 50 MG tablet Take 1 tablet (50 mg total) by mouth at bedtime as needed for sleep. 30 tablet 0     Patient Stressors: Financial difficulties Medication change or noncompliance  Patient Strengths: Average or above average intelligence Communication skills Supportive family/friends  Treatment Modalities: Medication Management, Group therapy, Case management,  1 to 1 session with clinician, Psychoeducation, Recreational therapy.   Physician Treatment Plan for Primary Diagnosis: Schizoaffective disorder, bipolar type ([place]) Long Term Goal(s): Improvement in symptoms so as ready for discharge  Short Term Goals: Ability to identify and develop effective coping behaviors will improve  Ability to identify and develop effective coping behaviors will improve Compliance with prescribed medications will improve  Medication Management: Evaluate patient's response, side effects, and tolerance of medication regimen.  Therapeutic Interventions: 1 to 1 sessions, Unit Group sessions and Medication administration.  Evaluation of Outcomes: Adequate for Discharge  Physician Treatment Plan for Secondary Diagnosis: Principal Problem:   Schizoaffective disorder, bipolar type ([place])   Long Term Goal(s): Improvement in symptoms so as ready for  discharge  Short Term Goals: Ability to identify and develop effective coping behaviors will improve Ability to identify and develop effective coping behaviors will improve Compliance with prescribed medications will improve  Medication Management: Evaluate patient's response, side effects, and tolerance of medication regimen.  Therapeutic Interventions: 1 to 1 sessions, Unit Group sessions and Medication administration.  Evaluation of Outcomes: Adequate for Discharge   RN Treatment Plan for Primary Diagnosis: Schizoaffective disorder, bipolar type ([place]) Long Term Goal(s): Knowledge of disease and therapeutic regimen to maintain health will improve  Short Term Goals: Ability to identify and develop effective coping behaviors will improve and Compliance with prescribed medications will improve  Medication Management: RN will administer medications as ordered by provider, will assess and evaluate patient's response and provide education to patient for prescribed medication. RN will report any adverse and/or side effects to prescribing provider.  Therapeutic Interventions: 1 on 1 counseling sessions, Psychoeducation, Medication administration, Evaluate responses to treatment, Monitor vital signs and CBGs as ordered, Perform/monitor CIWA, COWS, AIMS and Fall Risk screenings as ordered, Perform wound care treatments as ordered.  Evaluation of Outcomes: Adequate for Discharge   LCSW Treatment Plan for Primary Diagnosis: Schizoaffective disorder, bipolar type ([place]) Long Term Goal(s): Safe transition to appropriate next level of care at discharge, Engage patient in therapeutic group addressing interpersonal concerns.  Short Term Goals: Engage patient in aftercare planning with referrals and resources  Therapeutic Interventions: Assess for all discharge needs, 1 to 1 time with Social worker, Explore available resources and support systems, Assess for adequacy in community support network,  Educate family and significant other(s) on suicide prevention, Complete Psychosocial Assessment, Interpersonal group therapy.  Evaluation of Outcomes: Met  Return home, follow up with [facility]   Progress in Treatment: Attending groups: Yes Participating in groups: Yes Taking medication as prescribed: Yes Toleration medication: Yes, no side effects reported at this time Family/Significant other contact made: Two attempts Patient understands diagnosis: No Limited insight Discussing patient identified problems/goals with staff: Yes Medical problems stabilized or resolved: Yes Denies suicidal/homicidal ideation: Yes Issues/concerns per patient self-inventory: None Other: N/A  New problem(s) identified: None identified at this time.   New Short Term/Long Term Goal(s): Pt was unable to identify any goals, declined to sign   Discharge Plan or Barriers:   Reason for Continuation of Hospitalization: Disorganization Delusions  Paranoia Medication stabilization   Estimated Length of Stay: 3/20   Attendees: Patient: [name] 11/10/2017  10:42 AM  Physician: Maris Berger, MD 11/10/2017  10:42 AM  Nursing: Darrol Angel RN 11/10/2017  10:42 AM  RN Care Manager: Lars Pinks, RN 11/10/2017  10:42 AM  Social Worker: Ripley Fraise 11/10/2017  10:42 AM  Recreational Therapist: Winfield Cunas 11/10/2017  10:42 AM  Other: Norberto Sorenson 11/10/2017  10:42 AM  Other:  11/10/2017  10:42 AM    Scribe for Treatment Team:  Roque Lias LCSW 11/10/2017 10:42 AM

## 2017-11-10 NOTE — Progress Notes (Signed)
[facility]  11/10/2017 9:25 AM Monique Leydenansy P Buchner  MRN:  [id number]  Subjective: [name] reports, "I'm feeling great. I'm doing okay on the medicines. I just do not want to be over sedated. I sleep well. The Dr. Catalina Pizzaold me that I will be going tomorrow. I have been attending group sessions".  [name] is a 32 y/o F with history of schizoaffective disorder bipolar type who was admitted on IVC after she presented to the ED with worsening psychosis. As per pt's mother, pt had been disorganized, non-adherent to her home medication regimen, paranoid, and responding to internal stimuli. Pt refused to be resumed on Invega in the ED, and she was given IM zyprexa while in the ED. Upon initial presentation, pt continued to refuse Invega, and so she was started on haldol oral form with plan to transition to long-acting injectable form.  Today upon evaluation, pt presents clearer, alert, oriented & verbally responsive. She says she is doing well on her medications, however, does not want She denies any physical complaints. She denies SI/HI/AH/VH. She is sleeping well and her appetite is good. She is tolerating her current medications without difficulty or side effects. Discussed with patient about importance of transitioning to a long-acting injectable medication, and pt was initially hesitant, but with explanation of benefits of long-acting injectable form of medication, she was in agreement to continue with plan to transition from haldol oral form to haldol decanoate, but she will need a few more doses to establish tolerability and efficacy. Pt was in agreement with the above plan, and she had no further questions, comments, or concerns.  Principal Problem: Schizoaffective disorder, bipolar type (HCC) Diagnosis:   Patient Active Problem List   Diagnosis Date Noted  . Schizophrenia, paranoid type (HCC) [F20.0] 07/14/2017    Priority: High  . Somatic dysfunction of pelvic region [M99.05] 10/26/2014  . Paranoia  (HCC) [F22]   . Schizoaffective disorder, bipolar type (HCC) [F25.0] 03/11/2012   Total Time spent with patient: 15 minutes  Past Psychiatric History: See H&P  Past Medical History:  Past Medical History:  Diagnosis Date  . Bipolar 1 disorder (HCC) 2011  . Paranoia (HCC)   . Schizoaffective disorder (HCC)    History reviewed. No pertinent surgical history.  Family History:  Family History  Problem Relation Age of Onset  . Cancer Father        lymphoma  . Hypertension Mother   . Diabetes Neg Hx   . Heart disease Neg Hx    Family Psychiatric  History: See H&P Social History:  Social History   Substance and Sexual Activity  Alcohol Use No   Comment: Pt denies      Social History   Substance and Sexual Activity  Drug Use No   Comment: Pt denies    Social History   Socioeconomic History  . Marital status: Single    Spouse name: None  . Number of children: 0  . Years of education: some colle  . Highest education level: None  Social Needs  . Financial resource strain: None  . Food insecurity - worry: None  . Food insecurity - inability: None  . Transportation needs - medical: None  . Transportation needs - non-medical: None  Occupational History  . Occupation: [employer]     Comment: trying to start   Tobacco Use  . Smoking status: Never Smoker  . Smokeless tobacco: Never Used  Substance and Sexual Activity  . Alcohol use: No  Comment: Pt denies   . Drug use: No    Comment: Pt denies  . Sexual activity: No    Birth control/protection: Abstinence, None  Other Topics Concern  . None  Social History Narrative   Work or School: not working   Home Situation: lives with mother   Spiritual Beliefs: Christian   Lifestyle: no regular exercise; healthy diet         Additional Social History:   Sleep: Fair  Appetite:  Good  Current Medications: Current Facility-Administered Medications  Medication Dose Route Frequency Provider Last Rate Last Dose  .  acetaminophen (TYLENOL) tablet 650 mg  650 mg Oral Q6H PRN Charm Rings, NP      . alum & mag hydroxide-simeth (MAALOX/MYLANTA) 200-200-20 MG/5ML suspension 30 mL  30 mL Oral Q4H PRN Charm Rings, NP      . benztropine (COGENTIN) tablet 1 mg  1 mg Oral QHS Armandina Stammer I, NP   1 mg at 11/09/17 2143  . diphenhydrAMINE (BENADRYL) injection 50 mg  50 mg Intramuscular Q6H PRN Micheal Likens, MD      . haloperidol (HALDOL) tablet 10 mg  10 mg Oral QHS Micheal Likens, MD   10 mg at 11/09/17 2143  . haloperidol (HALDOL) tablet 5 mg  5 mg Oral Q6H PRN Micheal Likens, MD       Or  . haloperidol lactate (HALDOL) injection 5 mg  5 mg Intramuscular Q6H PRN Micheal Likens, MD      . haloperidol decanoate (HALDOL DECANOATE) 100 MG/ML injection 100 mg  100 mg Intramuscular Q30 days Jolyne Loa T, MD   100 mg at 11/05/17 1655  . hydrOXYzine (ATARAX/VISTARIL) tablet 25 mg  25 mg Oral TID PRN Armandina Stammer I, NP   25 mg at 11/03/17 0827  . magnesium hydroxide (MILK OF MAGNESIA) suspension 30 mL  30 mL Oral Daily PRN Charm Rings, NP      . traZODone (DESYREL) tablet 50 mg  50 mg Oral QHS PRN Charm Rings, NP   50 mg at 11/04/17 2158   Lab Results:  No results found for this or any previous visit (from the past 48 hour(s)). Blood Alcohol level:  Lab Results  Component Value Date   ETH <10 10/31/2017   ETH <5 01/27/2015    Metabolic Disorder Labs: Lab Results  Component Value Date   HGBA1C 5.5 11/02/2017   MPG 111.15 11/02/2017   No results found for: PROLACTIN Lab Results  Component Value Date   CHOL 179 07/27/2014   TRIG 49 07/27/2014   HDL 44 07/27/2014   CHOLHDL 5 06/21/2014   VLDL 10 07/27/2014   LDLCALC 125 (H) 07/27/2014   LDLCALC 100 (H) 06/21/2014   Physical Findings: AIMS: Facial and Oral Movements Muscles of Facial Expression: None, normal Lips and Perioral Area: None, normal Jaw: None, normal Tongue: None,  normal,Extremity Movements Upper (arms, wrists, hands, fingers): None, normal Lower (legs, knees, ankles, toes): None, normal, Trunk Movements Neck, shoulders, hips: None, normal, Overall Severity Severity of abnormal movements (highest score from questions above): None, normal Incapacitation due to abnormal movements: None, normal Patient's awareness of abnormal movements (rate only patient's report): No Awareness, Dental Status Current problems with teeth and/or dentures?: No Does patient usually wear dentures?: No  CIWA:    COWS:     Musculoskeletal: Strength & Muscle Tone: within normal limits Gait & Station: normal Patient leans: N/A  Psychiatric Specialty Exam: Physical Exam  Nursing note and vitals reviewed.   Review of Systems  Constitutional: Negative for chills and fever.  Respiratory: Negative for cough and shortness of breath.   Cardiovascular: Negative for chest pain.  Gastrointestinal: Negative for abdominal pain, heartburn, nausea and vomiting.  Psychiatric/Behavioral: Positive for hallucinations. Negative for depression and suicidal ideas. The patient is not nervous/anxious and does not have insomnia.     Blood pressure 121/70, pulse 89, temperature 97.8 F (36.6 C), temperature source Oral, resp. rate 18, height 5\' 4"  (1.626 m), weight 58.1 kg (128 lb), SpO2 97 %.Body mass index is 21.97 kg/m.  General Appearance: Disheveled  Eye Contact:  Fair  Speech:  Clear and Coherent and Normal Rate  Volume:  Normal  Mood:  Euthymic  Affect:  Congruent and Flat  Thought Process:  Coherent, Goal Directed and Descriptions of Associations: Tangential  Orientation:  Full (Time, Place, and Person)  Thought Content:  Hallucinations: Auditory  Suicidal Thoughts:  No  Homicidal Thoughts:  No  Memory:  Immediate;   Fair Recent;   Fair Remote;   Fair  Judgement:  Fair  Insight:  Fair  Psychomotor Activity:  Normal  Concentration:  Concentration: Fair  Recall:  [employer]  of Knowledge:  Fair  Language:  Fair  Akathisia:  No  Handed:    AIMS (if indicated):     Assets:  Resilience  ADL's:  Intact  Cognition:  WNL  Sleep:  Number of Hours: 2.5    Treatment Plan Summary: Daily contact with patient to assess and evaluate symptoms and progress in treatment and Medication management   - Continue inpatient hospitalization.  - Will continue today 11/04/2017 plan as below except where it is noted.  - Schizoaffective disorder             - Changed Haldol 5mg  po BID to a combined dose of haldol 10 mg po Q hs due to complain of daytime sedation.  -EPS             - Changed cogentin from 0.5mg  po BID to a combined dose 1 mg po Q hs for EPS  - Anxiety              - Continue atarax 25mg  po TID prn anxiety  - Insomnia              -Continue trazodone 50mg  po qhs prn insomnia  - Agitation                     - Continue haldol 5mg  po/IM q6h prn agitation             - Continue benadryl 50mg  IM q6h prn severe agitation  - Encourage participation in groups and therapeutic milieu  -Discharge planning will be ongoing  Armandina Stammer, NP, pmhnp, fnp-bc. 11/10/2017, 9:25 AMPatient ID: [name], female   DOB: [birth year], 32 y.o.   MRN: [id number]

## 2018-05-11 ENCOUNTER — Emergency Department (HOSPITAL_COMMUNITY)
Admission: EM | Admit: 2018-05-11 | Discharge: 2018-05-11 | Disposition: A | Discharge status: Other Acute Inpatient Hospital | Payer: Medicaid Other | Attending: Emergency Medicine | Admitting: Emergency Medicine

## 2018-05-11 ENCOUNTER — Inpatient Hospital Stay (HOSPITAL_COMMUNITY)
Admission: AD | Admit: 2018-05-11 | Discharge: 2018-05-17 | DRG: 885 | Disposition: A | Discharge status: Home / Self Care | Payer: Medicaid Other | Source: Intra-hospital | Attending: Psychiatry | Admitting: Psychiatry

## 2018-05-11 ENCOUNTER — Encounter (HOSPITAL_COMMUNITY): Payer: Self-pay | Admitting: Emergency Medicine

## 2018-05-11 ENCOUNTER — Other Ambulatory Visit: Payer: Self-pay

## 2018-05-11 ENCOUNTER — Encounter (HOSPITAL_COMMUNITY): Payer: Self-pay | Admitting: *Deleted

## 2018-05-11 ENCOUNTER — Other Ambulatory Visit: Payer: Self-pay | Admitting: Psychiatry

## 2018-05-11 DIAGNOSIS — F419 Anxiety disorder, unspecified: Secondary | ICD-10-CM | POA: Diagnosis not present

## 2018-05-11 DIAGNOSIS — R451 Restlessness and agitation: Secondary | ICD-10-CM | POA: Diagnosis present

## 2018-05-11 DIAGNOSIS — G4700 Insomnia, unspecified: Secondary | ICD-10-CM | POA: Diagnosis present

## 2018-05-11 DIAGNOSIS — F250 Schizoaffective disorder, bipolar type: Secondary | ICD-10-CM | POA: Diagnosis not present

## 2018-05-11 DIAGNOSIS — Z9114 Patient's other noncompliance with medication regimen: Secondary | ICD-10-CM | POA: Insufficient documentation

## 2018-05-11 DIAGNOSIS — Z888 Allergy status to other drugs, medicaments and biological substances status: Secondary | ICD-10-CM | POA: Diagnosis not present

## 2018-05-11 DIAGNOSIS — F200 Paranoid schizophrenia: Secondary | ICD-10-CM | POA: Insufficient documentation

## 2018-05-11 DIAGNOSIS — Z79899 Other long term (current) drug therapy: Secondary | ICD-10-CM

## 2018-05-11 LAB — CBC WITH DIFFERENTIAL/PLATELET
BASOS ABS: 0 10*3/uL (ref 0.0–0.1)
BASOS PCT: 1 %
EOS ABS: 0.1 10*3/uL (ref 0.0–0.7)
Eosinophils Relative: 1 %
HCT: 39.8 % (ref 36.0–46.0)
HEMOGLOBIN: 13.4 g/dL (ref 12.0–15.0)
Lymphocytes Relative: 54 %
Lymphs Abs: 2.5 10*3/uL (ref 0.7–4.0)
MCH: 27.2 pg (ref 26.0–34.0)
MCHC: 33.7 g/dL (ref 30.0–36.0)
MCV: 80.7 fL (ref 78.0–100.0)
Monocytes Absolute: 0.4 10*3/uL (ref 0.1–1.0)
Monocytes Relative: 9 %
NEUTROS PCT: 35 %
Neutro Abs: 1.6 10*3/uL — ABNORMAL LOW (ref 1.7–7.7)
Platelets: 281 10*3/uL (ref 150–400)
RBC: 4.93 MIL/uL (ref 3.87–5.11)
RDW: 14 % (ref 11.5–15.5)
WBC: 4.6 10*3/uL (ref 4.0–10.5)

## 2018-05-11 LAB — COMPREHENSIVE METABOLIC PANEL
ALBUMIN: 4.3 g/dL (ref 3.5–5.0)
ALT: 11 U/L (ref 0–44)
ANION GAP: 12 (ref 5–15)
AST: 16 U/L (ref 15–41)
Alkaline Phosphatase: 45 U/L (ref 38–126)
BUN: 8 mg/dL (ref 6–20)
CHLORIDE: 102 mmol/L (ref 98–111)
CO2: 25 mmol/L (ref 22–32)
Calcium: 9.4 mg/dL (ref 8.9–10.3)
Creatinine, Ser: 0.83 mg/dL (ref 0.44–1.00)
GFR calc Af Amer: >60 mL/min (ref >60)
GFR calc non Af Amer: >60 mL/min (ref >60)
Glucose, Bld: 121 mg/dL — ABNORMAL HIGH (ref 70–99)
Potassium: 3.5 mmol/L (ref 3.5–5.1)
SODIUM: 139 mmol/L (ref 135–145)
Total Bilirubin: 0.4 mg/dL (ref 0.3–1.2)
Total Protein: 7.4 g/dL (ref 6.5–8.1)

## 2018-05-11 LAB — ETHANOL

## 2018-05-11 LAB — I-STAT BETA HCG BLOOD, ED (MC, WL, AP ONLY)

## 2018-05-11 LAB — ACETAMINOPHEN LEVEL: Acetaminophen (Tylenol), Serum: <10 ug/mL — ABNORMAL LOW (ref 10–30)

## 2018-05-11 LAB — SALICYLATE LEVEL

## 2018-05-11 MED ORDER — DIPHENHYDRAMINE HCL 25 MG PO CAPS: 50.0000 mg | ORAL_CAPSULE | Freq: Three times a day (TID) | ORAL | Status: DC | PRN

## 2018-05-11 MED ORDER — HALOPERIDOL LACTATE 5 MG/ML IJ SOLN: 5.0000 mg | Freq: Three times a day (TID) | INTRAMUSCULAR | Status: DC | PRN

## 2018-05-11 MED ORDER — HALOPERIDOL 5 MG PO TABS: 5.0000 mg | ORAL_TABLET | Freq: Three times a day (TID) | ORAL | Status: DC | PRN

## 2018-05-11 MED ORDER — DIPHENHYDRAMINE HCL 50 MG/ML IJ SOLN: 50.0000 mg | Freq: Four times a day (QID) | INTRAMUSCULAR | Status: DC | PRN

## 2018-05-11 MED ORDER — DIPHENHYDRAMINE HCL 50 MG/ML IJ SOLN
50.0000 mg | Freq: Four times a day (QID) | INTRAMUSCULAR | Status: DC | PRN
  Filled 2018-05-11: qty 1

## 2018-05-11 MED ORDER — DIPHENHYDRAMINE HCL 50 MG/ML IJ SOLN: 50.0000 mg | Freq: Three times a day (TID) | INTRAMUSCULAR | Status: DC | PRN

## 2018-05-11 MED ORDER — HALOPERIDOL LACTATE 5 MG/ML IJ SOLN
5.0000 mg | Freq: Three times a day (TID) | INTRAMUSCULAR | Status: DC | PRN
  Administered 2018-05-11: 5 mg via INTRAMUSCULAR
  Filled 2018-05-11: qty 1

## 2018-05-11 MED ORDER — RISPERIDONE 0.5 MG PO TABS
0.5000 mg | ORAL_TABLET | Freq: Two times a day (BID) | ORAL | Status: DC
  Filled 2018-05-11: qty 1

## 2018-05-11 MED ORDER — RISPERIDONE 0.5 MG PO TABS
0.5000 mg | ORAL_TABLET | Freq: Two times a day (BID) | ORAL | Status: DC
  Filled 2018-05-11 (×4): qty 1

## 2018-05-11 NOTE — ED Notes (Signed)
Nurse practitioner notified that patient refused to take her medication po.  Patient beginning to get more paranoid and suspicious of staff, answering questions in a rude manner and beginning to get louder in her responses.  Nurse practiioner, advised to give prn IM medications.  Patient became highly agitated when told she was being given injections.  Had to be restrained by hospital security to receive meds.

## 2018-05-11 NOTE — ED Notes (Signed)
[facility].

## 2018-05-11 NOTE — BH Assessment (Signed)
[facility]      Per Malachy Chamberakia Starkes, NP patient meets inpatient admission criteria

## 2018-05-11 NOTE — ED Provider Notes (Signed)
[facility]   CSN: [id number] Arrival date & time: 05/11/18  0316     History   Chief Complaint Chief Complaint  Patient presents with  . Psychiatric Evaluation    HPI [name] is a 32 y.o. female.  Patient here with [employer] after being called by the patient for concern that her mother had died while at home. On their arrival, mother was there to meet them and was in her usual state of health. The patient has a history of paranoid schizophrenia and has apparently, per mom, not taken her medications in 6 months. Mom states her symptoms are never this severe. She seems to be responding to voices and states the voices told her that her mother was dead, prompting call to emergency services. The patient does not answer questions with definitive answers but gives answers that do not relate to the question being asked: when asked if she was hearing voices, her response was "as well, for future reference". She does not have a history of aggressive behavior toward others, or suicidal ideation or attempts. Mom feels she needs hospitalization for stabilization based on the degree of her current symptoms.   The history is provided by a parent and the police. No language interpreter was used.    Past Medical History:  Diagnosis Date  . Bipolar 1 disorder (HCC) 2011  . Paranoia (HCC)   . Schizoaffective disorder [facility])     Patient Active Problem List   Diagnosis Date Noted  . Schizophrenia, paranoid type (HCC) 07/14/2017  . Somatic dysfunction of pelvic region 10/26/2014  . Paranoia (HCC)   . Schizoaffective disorder, bipolar type (HCC) 03/11/2012    History reviewed. No pertinent surgical history.   OB History   None      Home Medications    Prior to Admission medications   Medication Sig Start Date End Date Taking? Authorizing Provider  benztropine (COGENTIN) 1 MG tablet Take 1 tablet (1 mg total) by mouth at bedtime. For  prevention of drug induced tremors 11/10/17   Armandina Stammer I, NP  haloperidol (HALDOL) 10 MG tablet Take 1 tablet (10 mg total) by mouth at bedtime. For mood control 11/10/17   Armandina Stammer I, NP  haloperidol decanoate (HALDOL DECANOATE) 100 MG/ML injection Inject 1 mL (100 mg total) into the muscle every 30 (thirty) days. (Due 12-05-17): For mood control 12/05/17   Armandina Stammer I, NP  hydrOXYzine (ATARAX/VISTARIL) 25 MG tablet Take 1 tablet (25 mg total) by mouth 3 (three) times daily as needed for anxiety. 11/10/17   Armandina Stammer I, NP  traZODone (DESYREL) 50 MG tablet Take 1 tablet (50 mg total) by mouth at bedtime as needed for sleep. 11/10/17   Sanjuana Kava, NP    Family History Family History  Problem Relation Age of Onset  . Cancer Father        lymphoma  . Hypertension Mother   . Diabetes Neg Hx   . Heart disease Neg Hx     Social History Social History   Tobacco Use  . Smoking status: Never Smoker  . Smokeless tobacco: Never Used  Substance Use Topics  . Alcohol use: No    Comment: Pt denies   . Drug use: No    Comment: Pt denies     Allergies   Quetiapine and Yes for women [intimacy products]   Review of Systems Review of Systems  Unable to perform ROS: Psychiatric disorder  Physical Exam Updated Vital Signs BP 119/74 (BP Location: Left Arm)   Pulse (!) 107   Temp 99.5 F (37.5 C) (Oral)   Resp 16   Ht 5\' 5"  (1.651 m)   Wt 54.4 kg   SpO2 94%   BMI 19.97 kg/m   Physical Exam  Constitutional: She is oriented to person, place, and time. She appears well-developed and well-nourished.  HENT:  Head: Normocephalic.  Neck: Normal range of motion. Neck supple.  Cardiovascular: Normal rate and regular rhythm.  Pulmonary/Chest: Effort normal and breath sounds normal. She has no wheezes. She has no rales.  Abdominal: Soft. Bowel sounds are normal. There is no tenderness. There is no rebound and no guarding.  Musculoskeletal: Normal range of motion.    Neurological: She is alert and oriented to person, place, and time.  Skin: Skin is warm and dry. No rash noted.  Psychiatric: Her affect is labile. Her speech is rapid and/or pressured and tangential. She is hyperactive. Thought content is delusional.     ED Treatments / Results  Labs (all labs ordered are listed, but only abnormal results are displayed) Labs Reviewed - No data to display  EKG None  Radiology No results found.  Procedures Procedures (including critical care time)  Medications Ordered in ED Medications - No data to display   Initial Impression / Assessment and Plan / ED Course  I have reviewed the triage vital signs and the nursing notes.  Pertinent labs & imaging results that were available during my care of the patient were reviewed by me and considered in my medical decision making (see chart for details).     Patient presents with [employer] after hearing voices that said her mother was dead in the home. She has been brought here for further evaluation and management. Reportedly not taking Haldol for 6 months.   The patient appears to be responding to external stimuli. She does not provide any useful or reliable information on history. She is cooperative at this time.   Mother in the department that provides additional collateral information. Appreciate her being here.   It is felt the patient would benefit from hospitalization for stabilization of paranoid schizophrenia. TTS consultation requested to help with appropriate disposition.   Final Clinical Impressions(s) / ED Diagnoses   Final diagnoses:  None   1. Paranoid schizophrenia 2. Medication noncompliance  ED Discharge Orders    None       Elpidio AnisUpstill, Franshesca Chipman, PA-C 05/11/18 0526    Ward, Layla MawKristen N, DO 05/11/18 [phone]

## 2018-05-11 NOTE — ED Notes (Signed)
Patient resting in her bed now after receiving medications.

## 2018-05-11 NOTE — BH Assessment (Signed)
Assessment Note  [name] is an 32 y.o. female who [facility] [employer].  Patient had called 911 in the middle of the night stating that her mother was having a coronary attack.  When the police arrived, patient's mother was fine and they determined that the patient was psychotic and brought her to the ED. TTS attempted to see the patient, but she was extremely paranoid and stated that she did not want to talk without her attorney being present.  She started rambling about her father that died in 2009 and she stated that she still talked to him because he was the only person she trusts and his spirit guides her.  She also stated that her mother died today which is not true.  Patient states that her mind is messed up, but she still has the ability to comprehend.  Patient's thoughts were loose and disorganized and she abruptly ended the assessment by saying that she was not going to talk anymore without her attorney.  Since patient was incoherent, TTS contacted patient's mother, Camie PatienceSylvia Sayre [phone],  for collateral information.  Mother states that patient has been off of her medication for the past six months.  She states that patient [facility] because she did not like the services that she was receiving and she stated that [facility], but has never made the appointment.  Mother states that patient has been slowly decompensating since then.  Mother states that patient has been very paranoid, acting very scared, and not wanting anyone to touch her. Mother states that patient has been hearing voices and talking to herself.  Mother states that a lot of patient's conversations make no sense.  Mother states that patient was [facility] and patient stayed in the hospital for about a week. Mother states that she does not feel like patient is suicidal or homicidal, but mostly just paranoid and delusional.  She states that  patient has no history of any drug use.  Mother states that patient has been sleeping well until recently, but patient is getting up in the middle of the night at times and is becoming more restless and agitated.  Mother states that patient has no history of abuse or self-mutilation.  Patient presented as alert and oriented x 3.  She does not appear to be aware of her situation. Both her remote and recent memory appeared to be impaired.  Her thoughts were loose and disorganized and she was paranoid and thinking that people want to do harm to her and she appeared to be responding to internal stimuli.  Patient has a history of depression and appeared to be depressed and her affect was blunted and flat.  Patient's judgment, insight and impulse control are impaired. Patient was very restless during her assessment.  Her appearance was disheveled.  Diagnosis: F20.0 Schizophrenia, Paranoid  Past Medical History:  Past Medical History:  Diagnosis Date  . Bipolar 1 disorder (HCC) 2011  . Paranoia (HCC)   . Schizoaffective disorder (HCC)     History reviewed. No pertinent surgical history.  Family History:  Family History  Problem Relation Age of Onset  . Cancer Father        lymphoma  . Hypertension Mother   . Diabetes Neg Hx   . Heart disease Neg Hx     Social History:  reports that she has never smoked. She has never used smokeless  tobacco. She reports that she does not drink alcohol or use drugs.  Additional Social History:  Alcohol / Drug Use Pain Medications: see MAR Prescriptions: see MAR Over the Counter: see MAR History of alcohol / drug use?: No history of alcohol / drug abuse  CIWA: CIWA-Ar BP: 119/74 Pulse Rate: (!) 107 COWS:    Allergies:  Allergies  Allergen Reactions  . Quetiapine Other (See Comments)    Facial skin irritation  . Yes For Women [Intimacy Products] Rash    Home Medications:  (Not in a hospital admission)  OB/GYN Status:  No LMP  recorded.  General Assessment Data Location of Assessment: [facility] TTS Assessment: In system Is this a Tele or Face-to-Face Assessment?: Face-to-Face Is this an Initial Assessment or a Re-assessment for this encounter?: Initial Assessment Patient Accompanied by:: N/A Language Other than English: No Living Arrangements: Other (Comment)(in apartment with mother) What gender do you identify as?: Female Marital status: Single Maiden name: Modesitt Pregnancy Status: No Living Arrangements: Parent Can pt return to current living arrangement?: Yes Admission Status: Voluntary Is patient capable of signing voluntary admission?: Yes Referral Source: Self/Family/Friend Insurance type: (Medicaid)     Crisis Care Plan Living Arrangements: Parent Legal Guardian: Other:(self) Name of Psychiatrist: Museum/gallery curator) Name of Therapist: Museum/gallery curator)  Education Status Is patient currently in school?: No Is the patient employed, unemployed or receiving disability?: Receiving disability income  Risk to self with the past 6 months Suicidal Ideation: No Has patient been a risk to self within the past 6 months prior to admission? : No Suicidal Intent: No Has patient had any suicidal intent within the past 6 months prior to admission? : No Is patient at risk for suicide?: No Suicidal Plan?: No Has patient had any suicidal plan within the past 6 months prior to admission? : No Access to Means: No What has been your use of drugs/alcohol within the last 12 months?: (none) Previous Attempts/Gestures: No How many times?: 0 Other Self Harm Risks: (none) Triggers for Past Attempts: None known Intentional Self Injurious Behavior: None Family Suicide History: No Recent stressful life event(s): Other (Comment)(none reported) Persecutory voices/beliefs?: Yes(feels like people are out to hurt her) Depression: Yes Depression Symptoms: Despondent, Isolating, Loss of interest in usual pleasures Substance abuse history  and/or treatment for substance abuse?: No Suicide prevention information given to non-admitted patients: Not applicable  Risk to Others within the past 6 months Homicidal Ideation: No Does patient have any lifetime risk of violence toward others beyond the six months prior to admission? : No Thoughts of Harm to Others: No Current Homicidal Intent: No Current Homicidal Plan: No Access to Homicidal Means: No Identified Victim: none History of harm to others?: No Assessment of Violence: None Noted Violent Behavior Description: none Does 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 to weapons?: No Criminal Charges Pending?: No Does patient have a court date: No Is patient on probation?: No  Psychosis Hallucinations: Auditory Delusions: Persecutory  Mental Status Report Appearance/Hygiene: Disheveled Eye Contact: Poor Motor Activity: Restlessness Speech: Incoherent Level of Consciousness: Alert Mood: Depressed, Anxious, Suspicious Affect: Blunted, Flat Anxiety Level: Moderate Thought Processes: Flight of Ideas, Thought Blocking Judgement: Impaired Orientation: Person, Place, Time Obsessive Compulsive Thoughts/Behaviors: Moderate  Cognitive Functioning Concentration: Decreased Memory: Recent Impaired, Remote Impaired Is patient IDD: No Insight: Poor Impulse Control: Poor Appetite: Good Have you had any weight changes? : No Change Sleep: Decreased(gets up at night) Vegetative Symptoms: Decreased grooming  ADLScreening [facility]) Patient's cognitive ability adequate to safely complete daily activities?:  Yes Patient able to express need for assistance with ADLs?: Yes Independently performs ADLs?: Yes (appropriate for developmental age)  Prior Inpatient Therapy Prior Inpatient Therapy: Yes Prior Therapy Dates: in the past year Prior Therapy Facilty/Provider(s): [facility] Reason for Treatment: Psychosis  Prior Outpatient Therapy Prior Outpatient Therapy: Yes Prior Therapy Dates:  [facility], has not been there in 6 months) Prior Therapy Facilty/Provider(s): [facility] Reason for Treatment: Psychosis Does patient have an ACCT team?: No Does patient have Intensive In-House Services?  : No Does patient have [facility] services? : Yes Does patient have P4CC services?: No  ADL Screening (condition at time of admission) Patient's cognitive ability adequate to safely complete daily activities?: Yes Is the patient deaf or have difficulty hearing?: No Does the patient have difficulty seeing, even when wearing glasses/contacts?: No Does the patient have difficulty concentrating, remembering, or making decisions?: No Patient able to express need for assistance with ADLs?: Yes Does the patient have difficulty dressing or bathing?: No Independently performs ADLs?: Yes (appropriate for developmental age) Does the patient have difficulty walking or climbing stairs?: No Weakness of Legs: None Weakness of Arms/Hands: None  Home Assistive Devices/Equipment Home Assistive Devices/Equipment: None  Therapy Consults (therapy consults require a physician order) PT Evaluation Needed: No OT Evalulation Needed: No SLP Evaluation Needed: No Abuse/Neglect Assessment (Assessment to be complete while patient is alone) Abuse/Neglect Assessment Can Be Completed: Yes Physical Abuse: Denies Verbal Abuse: Denies Sexual Abuse: Denies Exploitation of patient/patient's resources: Denies Self-Neglect: Denies Values / Beliefs Cultural Requests During Hospitalization: None Spiritual Requests During Hospitalization: None Consults Spiritual Care Consult Needed: No Social Work Consult Needed: No Merchant navy officer (For Healthcare) Does Patient Have a Medical Advance Directive?: No Would patient like information on creating a medical advance directive?: No - Patient declined Nutrition Screen- [facility] Has the patient recently lost weight without trying?: No Has the patient been eating poorly  because of a decreased appetite?: No Malnutrition Screening Tool Score: 0        Disposition: Per Malachy Chamber, NP patient meets inpatient admission criteria. Disposition Initial Assessment Completed for this Encounter: Yes Disposition of Patient: Admit Type of inpatient treatment program: Adult  On Site Evaluation by:   Reviewed with Physician:    Arnoldo Lenis Jones Viviani 05/11/2018 8:12 AM

## 2018-05-11 NOTE — ED Triage Notes (Signed)
Pt presents by [employer] for paranoia and hearing voices that were telling her that her mother was dead.

## 2018-05-11 NOTE — ED Notes (Signed)
Pt A&O x 3, Delusional, no distress noted, calm & cooperative at present.  Monitoring for safety, Q 15 min checks in effect.  [facility] [employer] [facility].

## 2018-05-11 NOTE — ED Notes (Signed)
[facility] Expected date:  Expected time:  Means of arrival:  Comments: tr4

## 2018-05-11 NOTE — ED Notes (Signed)
Attempted to assess patient.  Patient is a very poor historian at this time.  She is unable to communicate thoughts or feelings but is calm and cooperative.  Patient did nod that she had been taking her medications.

## 2018-05-11 NOTE — Progress Notes (Signed)
[facility]  Dr. Altamese Carolinaainville is the attending provider.   Call report to [phone] Latricia @ [facility] ED notified. Pt is involuntary and will be transported by law enforcement.   [facility].   Wells GuilesSarah Jaimie Pippins, LCSW, LCAS Disposition CSW [facility] [phone]

## 2018-05-11 NOTE — BH Assessment (Signed)
[facility] Assessment Progress Note      [facility]  Night shift AC will call with bed information for patient to be admitted

## 2018-05-11 NOTE — ED Notes (Signed)
Bed: [facility] Expected date:  Expected time:  Means of arrival:  Comments: 

## 2018-05-12 DIAGNOSIS — F250 Schizoaffective disorder, bipolar type: Principal | ICD-10-CM

## 2018-05-12 MED ORDER — BENZTROPINE MESYLATE 1 MG PO TABS
1.0000 mg | ORAL_TABLET | Freq: Two times a day (BID) | ORAL | Status: DC
  Administered 2018-05-12 – 2018-05-17 (×9): 1 mg via ORAL
  Filled 2018-05-12 (×14): qty 1

## 2018-05-12 MED ORDER — HALOPERIDOL 5 MG PO TABS
5.0000 mg | ORAL_TABLET | Freq: Two times a day (BID) | ORAL | Status: DC
  Administered 2018-05-12 – 2018-05-17 (×9): 5 mg via ORAL
  Filled 2018-05-12 (×15): qty 1

## 2018-05-12 NOTE — H&P (Signed)
Psychiatric Admission Assessment Adult  Patient Identification: [name] MRN:  [id number] Date of Evaluation:  05/12/2018 Chief Complaint:  schizophrenia Principal Diagnosis: Schizophrenia ([place]) Diagnosis:   Patient Active Problem List   Diagnosis Date Noted  . Schizophrenia ([place]) [F20.9] 05/11/2018  . Schizophrenia, paranoid type ([place]) [F20.0] 07/14/2017  . Somatic dysfunction of pelvic region [M99.05] 10/26/2014  . Paranoia ([place]) [F22]   . Schizoaffective disorder, bipolar type ([place]) [F25.0] 03/11/2012   History of Present Illness:   [name] is a 32 y/o F with history of schizophrenia (elsewhere schizoaffective disorder bipolar type) who was admitted [facility] on IVC initiated in ED after she contacted emergency services with concern that her mother had a myocardial infarction and had died; however, when emergency services arrived they found pt's mother to be fine and pt was paranoid, agitated, and disorganized. Pt was transported to ED, and she remained paranoid and disorganized. Pt has reportedly not been following up with outpatient provider at [facility]. She has recent admission to [facility] with discharge on 11/10/17. She was medically cleared and then transferred to [facility] for additional treatment and stabilization.  Upon initial interview pt shares, "There was just a little glitch weith me and my mom. Something didn't feel right at home. I had a weird feeling. I was watching TV and I fell asleep on the couch. When I woke up I felt like a presence. That's when I called 911." Pt reports feeling overwhelmed with concern that her mother had died, but she now no longer feels worried about that. She endorses some paranoia prior to this event, sharing that others have been rearranging objects in the home. She denies SI/HI/AH/VH. She denies symptoms of mania, OCD, PTSD, and depression. She is sleeping well. Her appetite is good. She denies recent illicit substance use.   Discussed with patient about  treatment options. She confirms that she has not been taking any medications in recent months. She most recently was discharged from [facility] on haldol decanoate. She reports she had some weight gain with that medication, but she is hesitant to try something different. She agrees to be resumed on haldol oral form, and we will again assess for tolerability. Pt was in agreement with the above plan, and she had no further questions, comments, or concerns.   Associated Signs/Symptoms: Depression Symptoms:  anxiety, (Hypo) Manic Symptoms:  Delusions, Distractibility, Impulsivity, Labiality of Mood, Anxiety Symptoms:  Excessive Worry, Psychotic Symptoms:  Delusions, Ideas of Reference, Paranoia, PTSD Symptoms: NA Total Time spent with patient: 1 hour  Past Psychiatric History:   - Previous dx of schizoaffective disorder bipolar type - Pt estimates more than 5 admissions in the past, but cannot recall when last occurred. [facility]. - She has no current outpatient provider but she has worked with [facility] in the past - She previously denies history of suicide attempt  Is the patient at risk to self? Yes.    Has the patient been a risk to self in the past 6 months? Yes.    Has the patient been a risk to self within the distant past? Yes.    Is the patient a risk to others? Yes.    Has the patient been a risk to others in the past 6 months? Yes.    Has the patient been a risk to others within the distant past? Yes.     Prior Inpatient Therapy:   Prior Outpatient Therapy:    Alcohol Screening: 1. How often  do you have a drink containing alcohol?: Never 2. How many drinks containing alcohol do you have on a typical day when you are drinking?: 1 or 2 3. How often do you have six or more drinks on one occasion?: Never AUDIT-C Score: 0 4. How often during the last year have you found that you were not able to stop drinking once you had started?: Never 5. How often during the  last year have you failed to do what was normally expected from you becasue of drinking?: Never 6. How often during the last year have you needed a first drink in the morning to get yourself going after a heavy drinking session?: Never 7. How often during the last year have you had a feeling of guilt of remorse after drinking?: Never 8. How often during the last year have you been unable to remember what happened the night before because you had been drinking?: Never 9. Have you or someone else been injured as a result of your drinking?: No 10. Has a relative or friend or a doctor or another health worker been concerned about your drinking or suggested you cut down?: No Alcohol Use Disorder Identification Test Final Score (AUDIT): 0 Substance Abuse History in the last 12 months:  No. Consequences of Substance Abuse: NA Previous Psychotropic Medications: Yes  Psychological Evaluations: Yes  Past Medical History:  Past Medical History:  Diagnosis Date  . Bipolar 1 disorder ([place]) 2011  . Paranoia ([place])   . Schizoaffective disorder ([place])    History reviewed. No pertinent surgical history. Family History:  Family History  Problem Relation Age of Onset  . Cancer Father        lymphoma  . Hypertension Mother   . Diabetes Neg Hx   . Heart disease Neg Hx    Family Psychiatric  History: denies family psychiatric history. Tobacco Screening: Have you used any form of tobacco in the last 30 days? (Cigarettes, Smokeless Tobacco, Cigars, and/or Pipes): No Social History: Pt is from the [place] area. She lives with her mother. She does not work. She has no children. She denies legal and trauma histories. Social History   Substance and Sexual Activity  Alcohol Use No   Comment: Pt denies      Social History   Substance and Sexual Activity  Drug Use No   Comment: Pt denies    Additional Social History:                           Allergies:   Allergies  Allergen Reactions   . Quetiapine Other (See Comments)    Facial skin irritation  . Yes For Women [Intimacy Products] Rash  . Zyprexa [Olanzapine] Rash   Lab Results:  Results for orders placed or performed during the hospital encounter of 05/11/18 (from the past 48 hour(s))  CBC with Differential     Status: Abnormal   Collection Time: 05/11/18  4:02 AM  Result Value Ref Range   WBC 4.6 4.0 - 10.5 K/uL   RBC 4.93 3.87 - 5.11 MIL/uL   Hemoglobin 13.4 12.0 - 15.0 g/dL   HCT 39.8 36.0 - 46.0 %   MCV 80.7 78.0 - 100.0 fL   MCH 27.2 26.0 - 34.0 pg   MCHC 33.7 30.0 - 36.0 g/dL   RDW 14.0 11.5 - 15.5 %   Platelets 281 150 - 400 K/uL   Neutrophils Relative % 35 %   Neutro Abs 1.6 (  L) 1.7 - 7.7 K/uL   Lymphocytes Relative 54 %   Lymphs Abs 2.5 0.7 - 4.0 K/uL   Monocytes Relative 9 %   Monocytes Absolute 0.4 0.1 - 1.0 K/uL   Eosinophils Relative 1 %   Eosinophils Absolute 0.1 0.0 - 0.7 K/uL   Basophils Relative 1 %   Basophils Absolute 0.0 0.0 - 0.1 K/uL    Comment: Performed at [facility], [place] [address], [place], [place] [address]  Salicylate level     Status: None   Collection Time: 05/11/18  4:02 AM  Result Value Ref Range   Salicylate Lvl <4.1 2.8 - 30.0 mg/dL    Comment: Performed at [facility], [place] [address] [place], [place] [address]  Comprehensive metabolic panel     Status: Abnormal   Collection Time: 05/11/18  4:02 AM  Result Value Ref Range   Sodium 139 135 - 145 mmol/L   Potassium 3.5 3.5 - 5.1 mmol/L   Chloride 102 98 - 111 mmol/L   CO2 25 22 - 32 mmol/L   Glucose, Bld 121 (H) 70 - 99 mg/dL   BUN 8 6 - 20 mg/dL   Creatinine, Ser 0.83 0.44 - 1.00 mg/dL   Calcium 9.4 8.9 - 10.3 mg/dL   Total Protein 7.4 6.5 - 8.1 g/dL   Albumin 4.3 3.5 - 5.0 g/dL   AST 16 15 - 41 U/L   ALT 11 0 - 44 U/L   Alkaline Phosphatase 45 38 - 126 U/L   Total Bilirubin 0.4 0.3 - 1.2 mg/dL   GFR calc non Af Amer >60 >60 mL/min   GFR calc Af Amer >60 >60 mL/min     Comment: (NOTE) The eGFR has been calculated using the CKD EPI equation. This calculation has not been validated in all clinical situations. eGFR's persistently <60 mL/min signify possible Chronic Kidney Disease.    Anion gap 12 5 - 15    Comment: Performed at [facility], [place] [address] [place], [place] [address]  Acetaminophen level     Status: Abnormal   Collection Time: 05/11/18  4:02 AM  Result Value Ref Range   Acetaminophen (Tylenol), Serum <10 (L) 10 - 30 ug/mL    Comment: (NOTE) Therapeutic concentrations vary significantly. A range of 10-30 ug/mL  may be an effective concentration for many patients. However, some  are best treated at concentrations outside of this range. Acetaminophen concentrations >150 ug/mL at 4 hours after ingestion  and >50 ug/mL at 12 hours after ingestion are often associated with  toxic reactions. Performed at [facility], [place] [address] [place], [place] [address]   Ethanol     Status: None   Collection Time: 05/11/18  4:02 AM  Result Value Ref Range   Alcohol, Ethyl (B) <10 <10 mg/dL    Comment: (NOTE) Lowest detectable limit for serum alcohol is 10 mg/dL. For medical purposes only. Performed at [facility], [place] [address] [place], [place] [address]   I-Stat beta hCG blood, ED     Status: None   Collection Time: 05/11/18  4:08 AM  Result Value Ref Range   I-stat hCG, quantitative <5.0 <5 mIU/mL   Comment 3            Comment:   GEST. AGE      CONC.  (mIU/mL)   <=1 WEEK        5 - 50     2 WEEKS  50 - 500     3 WEEKS       100 - 10,000     4 WEEKS     1,000 - 30,000        FEMALE AND NON-PREGNANT FEMALE:     LESS THAN 5 mIU/mL     Blood Alcohol level:  Lab Results  Component Value Date   ETH <10 05/11/2018   ETH <10 87/56/4332    Metabolic Disorder Labs:  Lab Results  Component Value Date   HGBA1C 5.5 11/02/2017   MPG 111.15 11/02/2017   No results found  for: PROLACTIN Lab Results  Component Value Date   CHOL 179 07/27/2014   TRIG 49 07/27/2014   HDL 44 07/27/2014   CHOLHDL 5 06/21/2014   VLDL 10 07/27/2014   LDLCALC 125 (H) 07/27/2014   LDLCALC 100 (H) 06/21/2014    Current Medications: Current Facility-Administered Medications  Medication Dose Route Frequency Provider Last Rate Last Dose  . benztropine (COGENTIN) tablet 1 mg  1 mg Oral BID [name], MD      . diphenhydrAMINE (BENADRYL) capsule 50 mg  50 mg Oral Q8H PRN Rankin, Shuvon B, NP       Or  . diphenhydrAMINE (BENADRYL) injection 50 mg  50 mg Intramuscular Q6H PRN Rankin, Shuvon B, NP      . haloperidol (HALDOL) tablet 5 mg  5 mg Oral Q8H PRN Rankin, Shuvon B, NP       Or  . haloperidol lactate (HALDOL) injection 5 mg  5 mg Intramuscular Q8H PRN Rankin, Shuvon B, NP      . haloperidol (HALDOL) tablet 5 mg  5 mg Oral BID [name], MD   5 mg at 05/12/18 1212   PTA Medications: Medications Prior to Admission  Medication Sig Dispense Refill Last Dose  . benztropine (COGENTIN) 1 MG tablet Take 1 tablet (1 mg total) by mouth at bedtime. For prevention of drug induced tremors (Patient not taking: Reported on 05/11/2018) 30 tablet 0 Not Taking at Unknown time  . haloperidol (HALDOL) 10 MG tablet Take 1 tablet (10 mg total) by mouth at bedtime. For mood control (Patient not taking: Reported on 05/11/2018) 30 tablet 0 Not Taking at Unknown time  . haloperidol decanoate (HALDOL DECANOATE) 100 MG/ML injection Inject 1 mL (100 mg total) into the muscle every 30 (thirty) days. (Due 12-05-17): For mood control (Patient not taking: Reported on 05/11/2018) 1 mL 0 Not Taking at Unknown time  . hydrOXYzine (ATARAX/VISTARIL) 25 MG tablet Take 1 tablet (25 mg total) by mouth 3 (three) times daily as needed for anxiety. (Patient not taking: Reported on 05/11/2018) 60 tablet 0 Not Taking at Unknown time  . OVER THE COUNTER MEDICATION Take 1 each by mouth daily.   Past  Week at Unknown time  . traZODone (DESYREL) 50 MG tablet Take 1 tablet (50 mg total) by mouth at bedtime as needed for sleep. (Patient not taking: Reported on 05/11/2018) 30 tablet 0 Not Taking at Unknown time    Musculoskeletal: Strength & Muscle Tone: within normal limits Gait & Station: normal Patient leans: N/A  Psychiatric Specialty Exam: Physical Exam  Nursing note and vitals reviewed.   Review of Systems  Constitutional: Negative for chills and fever.  Respiratory: Negative for cough and shortness of breath.   Cardiovascular: Negative for chest pain.  Gastrointestinal: Negative for abdominal pain, heartburn, nausea and vomiting.  Psychiatric/Behavioral: Negative for depression, hallucinations and suicidal ideas. The patient is not  nervous/anxious and does not have insomnia.     Blood pressure 104/72, pulse (!) 125, temperature 99.1 F (37.3 C), temperature source Oral, resp. rate 18, height _0  (1.626 m), weight 58.1 kg.Body mass index is 21.97 kg/m.  General Appearance: Casual and Disheveled  Eye Contact:  Good  Speech:  Clear and Coherent and Normal Rate  Volume:  Normal  Mood:  Anxious  Affect:  Blunt  Thought Process:  Coherent, Goal Directed and Descriptions of Associations: Loose  Orientation:  Full (Time, Place, and Person)  Thought Content:  Delusions, Ideas of Reference:   Paranoia Delusions and Paranoid Ideation  Suicidal Thoughts:  No  Homicidal Thoughts:  No  Memory:  Immediate;   Fair Recent;   Fair Remote;   Fair  Judgement:  Poor  Insight:  Lacking  Psychomotor Activity:  Normal  Concentration:  Concentration: Fair  Recall:  [employer] of Knowledge:  Fair  Language:  Fair  Akathisia:  No  Handed:    AIMS (if indicated):     Assets:  Resilience Social Support  ADL's:  Intact  Cognition:  WNL  Sleep:  Number of Hours: 3.5    Treatment Plan Summary: Daily contact with patient to assess and evaluate symptoms and progress in treatment and  Medication management  Observation Level/Precautions:  15 minute checks  Laboratory:  CBC Chemistry Profile HbAIC HCG UDS UA  Psychotherapy:  Encourage participation in groups and therapeutic milieu   Medications:  Start haldol 61m po BID. Continue all other current orders without changes. See MAR for current PRN's for agitation.  Consultations:    Discharge Concerns:    Estimated LOS: 5-7 days  Other:     Physician Treatment Plan for Primary Diagnosis: [place]ia ([place] Long Term Goal(s): Improvement in symptoms so as ready for discharge  Short Term Goals: Ability to identify and develop effective coping behaviors will improve  Physician Treatment Plan for Secondary Diagnosis: Principal Problem:   Schizophrenia ([place]  Long Term Goal(s): Improvement in symptoms so as ready for discharge  Short Term Goals: Compliance with prescribed medications will improve  I certify that inpatient services furnished can reasonably be expected to improve the patient's condition.    CPennelope Bracken MD 9/18/20192:58 PM

## 2018-05-12 NOTE — Progress Notes (Signed)
Recreation Therapy Notes  INPATIENT RECREATION THERAPY ASSESSMENT  Patient Details Name: [name] MRN: [id number] DOB: 10/27/1985 Today's Date: 05/12/2018       Information Obtained From: Patient  Able to Participate in Assessment/Interview: Yes  Patient Presentation: Alert(Pt also seemed to be responding to internal stimuli)  Reason for Admission (Per Patient): Other (Comments)(Pt stated her home situation was why she is here.)  Patient Stressors: Family, Friends, Work, Other (Comment)(Finances)  Coping Skills:   Film/video editorsolation, Arguments, Write, TV, Music, Exercise, Hot Bath/Shower, Meditate, Impulsivity, Prayer, Avoidance, Read, Deep Breathing  Leisure Interests (2+):  Sports - Other (Comment)(Jump rope; Ping pong)  Frequency of Recreation/Participation: Other (Comment)(Not often)  Awareness of Community Resources:  Yes  Community Resources:  Park, [facility], Other (Comment)(Stores)  Current Use: Yes  If no, Barriers?:    Expressed Interest in State [employer] CorporationCommunity Resource Information: No  [place] EnergyCounty of Residence:  [place]  Patient Main Form of Transportation: Set designerCar  Patient Strengths:  Analyze well; Reading  Patient Identified Areas of Improvement:  Communication feelings  Patient Goal for Hospitalization:  "Do outpatient services"  Current SI (including self-harm):  No  Current HI:  No  Current AVH: No  Staff Intervention Plan: Group Attendance, Collaborate with Interdisciplinary Treatment Team  Consent to Intern Participation: N/A   Caroll RancherMarjette Artemis Loyal, LRT/CTRS  Caroll RancherLindsay, Lynnann Knudsen A 05/12/2018, 2:41 PM

## 2018-05-12 NOTE — Tx Team (Signed)
Initial Treatment Plan 05/12/2018 12:01 AM [name] WUJ:[id number]    PATIENT STRESSORS: Marital or family conflict Medication change or noncompliance   PATIENT STRENGTHS: Ability for insight General fund of knowledge   PATIENT IDENTIFIED PROBLEMS: Psychosis "I wanna do my own thing, I want to be independent"                     DISCHARGE CRITERIA:  Ability to meet basic life and health needs Improved stabilization in mood, thinking, and/or behavior Verbal commitment to aftercare and medication compliance  PRELIMINARY DISCHARGE PLAN: Attend aftercare/continuing care group Return to previous living arrangement  PATIENT/FAMILY INVOLVEMENT: This treatment plan has been presented to and reviewed with the patient, Monique Connye BurkittP Folk, and/or family member, .  The patient and family have been given the opportunity to ask questions and make suggestions.  Donne Baley, [place] Wayne, [place]RN 05/12/2018, 12:01 AM

## 2018-05-12 NOTE — Tx Team (Signed)
Interdisciplinary Treatment and Diagnostic Plan Update  05/12/2018 Time of Session: 3:23 PM  [name] MRN: [id number]  Principal Diagnosis: Schizoaffective disorder, bipolar type [facility])  Secondary Diagnoses: Principal Problem:   Schizoaffective disorder, bipolar type ([place])   Current Medications:  Current Facility-Administered Medications  Medication Dose Route Frequency Provider Last Rate Last Dose  . benztropine (COGENTIN) tablet 1 mg  1 mg Oral BID [name], MD      . diphenhydrAMINE (BENADRYL) capsule 50 mg  50 mg Oral Q8H PRN Rankin, Shuvon B, NP       Or  . diphenhydrAMINE (BENADRYL) injection 50 mg  50 mg Intramuscular Q6H PRN Rankin, Shuvon B, NP      . haloperidol (HALDOL) tablet 5 mg  5 mg Oral Q8H PRN Rankin, Shuvon B, NP       Or  . haloperidol lactate (HALDOL) injection 5 mg  5 mg Intramuscular Q8H PRN Rankin, Shuvon B, NP      . haloperidol (HALDOL) tablet 5 mg  5 mg Oral BID [name], MD   5 mg at 05/12/18 1212    PTA Medications: Medications Prior to Admission  Medication Sig Dispense Refill Last Dose  . benztropine (COGENTIN) 1 MG tablet Take 1 tablet (1 mg total) by mouth at bedtime. For prevention of drug induced tremors (Patient not taking: Reported on 05/11/2018) 30 tablet 0 Not Taking at Unknown time  . haloperidol (HALDOL) 10 MG tablet Take 1 tablet (10 mg total) by mouth at bedtime. For mood control (Patient not taking: Reported on 05/11/2018) 30 tablet 0 Not Taking at Unknown time  . haloperidol decanoate (HALDOL DECANOATE) 100 MG/ML injection Inject 1 mL (100 mg total) into the muscle every 30 (thirty) days. (Due 12-05-17): For mood control (Patient not taking: Reported on 05/11/2018) 1 mL 0 Not Taking at Unknown time  . hydrOXYzine (ATARAX/VISTARIL) 25 MG tablet Take 1 tablet (25 mg total) by mouth 3 (three) times daily as needed for anxiety. (Patient not taking: Reported on 05/11/2018) 60 tablet 0 Not Taking at Unknown time  .  OVER THE COUNTER MEDICATION Take 1 each by mouth daily.   Past Week at Unknown time  . traZODone (DESYREL) 50 MG tablet Take 1 tablet (50 mg total) by mouth at bedtime as needed for sleep. (Patient not taking: Reported on 05/11/2018) 30 tablet 0 Not Taking at Unknown time    Patient Stressors: Marital or family conflict Medication change or noncompliance  Patient Strengths: Ability for insight General fund of knowledge  Treatment Modalities: Medication Management, Group therapy, Case management,  1 to 1 session with clinician, Psychoeducation, Recreational therapy.   Physician Treatment Plan for Primary Diagnosis: Schizoaffective disorder, bipolar type ([name]) Long Term Goal(s): Improvement in symptoms so as ready for discharge  Short Term Goals: Ability to identify and develop effective coping behaviors will improve Compliance with prescribed medications will improve  Medication Management: Evaluate patient's response, side effects, and tolerance of medication regimen.  Therapeutic Interventions: 1 to 1 sessions, Unit Group sessions and Medication administration.  Evaluation of Outcomes: Progressing  Physician Treatment Plan for Secondary Diagnosis: Principal Problem:   Schizoaffective disorder, bipolar type ([name])   Long Term Goal(s): Improvement in symptoms so as ready for discharge  Short Term Goals: Ability to identify and develop effective coping behaviors will improve Compliance with prescribed medications will improve  Medication Management: Evaluate patient's response, side effects, and tolerance of medication regimen.  Therapeutic Interventions: 1 to 1 sessions, Unit Group sessions  and Medication administration.  Evaluation of Outcomes: Progressing   RN Treatment Plan for Primary Diagnosis: Schizoaffective disorder, bipolar type ([name]) Long Term Goal(s): Knowledge of disease and therapeutic regimen to maintain health will improve  Short Term Goals: Ability to  identify and develop effective coping behaviors will improve and Compliance with prescribed medications will improve  Medication Management: RN will administer medications as ordered by provider, will assess and evaluate patient's response and provide education to patient for prescribed medication. RN will report any adverse and/or side effects to prescribing provider.  Therapeutic Interventions: 1 on 1 counseling sessions, Psychoeducation, Medication administration, Evaluate responses to treatment, Monitor vital signs and CBGs as ordered, Perform/monitor CIWA, COWS, AIMS and Fall Risk screenings as ordered, Perform wound care treatments as ordered.  Evaluation of Outcomes: Progressing   LCSW Treatment Plan for Primary Diagnosis: Schizoaffective disorder, bipolar type ([name]) Long Term Goal(s): Safe transition to appropriate next level of care at discharge, Engage patient in therapeutic group addressing interpersonal concerns.  Short Term Goals: Engage patient in aftercare planning with referrals and resources  Therapeutic Interventions: Assess for all discharge needs, 1 to 1 time with Social worker, Explore available resources and support systems, Assess for adequacy in community support network, Educate family and significant other(s) on suicide prevention, Complete Psychosocial Assessment, Interpersonal group therapy.  Evaluation of Outcomes: Met  Return home, follow up [facility]   Progress in Treatment: Attending groups: No Participating in groups: No Taking medication as prescribed: Yes Toleration medication: Yes, no side effects reported at this time Family/Significant other contact made: No Patient understands diagnosis: No Limited insight Discussing patient identified problems/goals with staff: Yes Medical problems stabilized or resolved: Yes Denies suicidal/homicidal ideation: Yes Issues/concerns per patient self-inventory: None Other: N/A  New problem(s) identified: None  identified at this time.   New Short Term/Long Term Goal(s): "make sure that my mom is OK"   Discharge Plan or Barriers:   Reason for Continuation of Hospitalization: Paranoia Disorganization Medication stabilization   Estimated Length of Stay: 9/23  Attendees: Patient: [name] 05/12/2018  3:23 PM  Physician: Maris Berger, MD 05/12/2018  3:23 PM  Nursing: Sena Hitch, RN 05/12/2018  3:23 PM  RN Care Manager: Lars Pinks, RN 05/12/2018  3:23 PM  Social Worker: Ripley Fraise 05/12/2018  3:23 PM  Recreational Therapist: Winfield Cunas 05/12/2018  3:23 PM  Other: Norberto Sorenson 05/12/2018  3:23 PM  Other:  05/12/2018  3:23 PM    Scribe for Treatment Team:  Roque Lias LCSW 05/12/2018 3:23 PM
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
## 2018-05-12 NOTE — [facility] Suicide Risk Assessment (Signed)
[facility]   Nursing information obtained from:  Patient Demographic factors:  Low socioeconomic status Current Mental Status:  NA Loss Factors:  NA Historical Factors:  Family history of mental illness or substance abuse Risk Reduction Factors:  Living with another person, especially a relative  Total Time spent with patient: 1 hour Principal Problem: Schizoaffective disorder, bipolar type (HCC) Diagnosis:   Patient Active Problem List   Diagnosis Date Noted  . Schizophrenia, paranoid type (HCC) [F20.0] 07/14/2017  . Somatic dysfunction of pelvic region [M99.05] 10/26/2014  . Paranoia (HCC) [F22]   . Schizoaffective disorder, bipolar type (HCC) [F25.0] 03/11/2012   Subjective Data: see H&P   Continued Clinical Symptoms:  Alcohol Use Disorder Identification Test Final Score (AUDIT): 0 The "Alcohol Use Disorders Identification Test", Guidelines for Use in Primary Care, Second Edition.  World Science writerHealth Organization [facility](WHO). Score between 0-7:  no or low risk or alcohol related problems. Score between 8-15:  moderate risk of alcohol related problems. Score between 16-19:  high risk of alcohol related problems. Score 20 or above:  warrants further diagnostic evaluation for alcohol dependence and treatment.  Psychiatric Specialty Exam: Physical Exam  Nursing note and vitals reviewed.     Blood pressure 104/72, pulse (!) 125, temperature 99.1 F (37.3 C), temperature source Oral, resp. rate 18, height 5\' 4"  (1.626 m), weight 58.1 kg.Body mass index is 21.97 kg/m.     COGNITIVE FEATURES THAT CONTRIBUTE TO RISK:  None    SUICIDE RISK:   Minimal: No identifiable suicidal ideation.  Patients presenting with no risk factors but with morbid ruminations; may be classified as minimal risk based on the severity of the depressive symptoms  PLAN OF CARE: see H&P  I certify that inpatient services furnished can reasonably be expected to improve the patient's  condition.   Micheal Likenshristopher T Kensey Luepke, MD 05/12/2018, 3:11 PM

## 2018-05-12 NOTE — Progress Notes (Signed)
Patient did not attend wrap up group. 

## 2018-05-12 NOTE — [facility] Group Notes (Signed)
LCSW Group Therapy Note  05/12/2018 1:15pm  Type of Therapy and Topic:  Group Therapy:  Feelings around Relapse and Recovery  Participation Level:  None   Description of Group:    Patients in this group will discuss emotions they experience before and after a relapse. They will process how experiencing these feelings, or avoidance of experiencing them, relates to having a relapse. Facilitator will guide patients to explore emotions they have related to recovery. Patients will be encouraged to process which emotions are more powerful. They will be guided to discuss the emotional reaction significant others in their lives may have to their relapse or recovery. Patients will be assisted in exploring ways to respond to the emotions of others without this contributing to a relapse.  Therapeutic Goals: 1. Patient will identify two or more emotions that lead to a relapse for them 2. Patient will identify two emotions that result when they relapse 3. Patient will identify two emotions related to recovery 4. Patient will demonstrate ability to communicate their needs through discussion and/or role plays   Summary of Patient Progress:  [name] began the session and left early.  [name] began the session. She left and returned several times unable to remain in group.  Therapeutic Modalities:   Cognitive Behavioral Therapy Solution-Focused Therapy Assertiveness Training Relapse Prevention Therapy   Ida RogueRodney B Billye Nydam, LCSW 05/12/2018 1:43 PM

## 2018-05-12 NOTE — [facility] Suicide Risk Assessment (Signed)
[facility] INPATIENT:  Family/Significant Other Suicide Prevention Education  Suicide Prevention Education:  Education Completed; No one has been identified by the patient as the family member/significant other with whom the patient will be residing, and identified as the person(s) who will aid the patient in the event of a mental health crisis (suicidal ideations/suicide attempt).  With written consent from the patient, the family member/significant other has been provided the following suicide prevention education, prior to the and/or following the discharge of the patient.  The suicide prevention education provided includes the following:  Suicide risk factors  Suicide prevention and interventions  National Suicide Hotline telephone number  [facility] assessment telephone number  [facility] Emergency Assistance 911  [facility] and/or Residential Mobile Crisis Unit telephone number  Request made of family/significant other to:  Remove weapons (e.g., guns, rifles, knives), all items previously/currently identified as safety concern.    Remove drugs/medications (over-the-counter, prescriptions, illicit drugs), all items previously/currently identified as a safety concern.  The family member/significant other verbalizes understanding of the suicide prevention education information provided.  The family member/significant other agrees to remove the items of safety concern listed above. The patient did not endorse SI at the time of admission, nor did the patient c/o SI during the stay here.  SPE not required.  Monique DaubRodney [name] [facility] [name] 05/12/2018, 8:41 AM

## 2018-05-12 NOTE — Progress Notes (Signed)
[name] is a 32 year old female pt admitted on involuntary basis. On admission, she appears to be responding to internal stimuli. She talked about how her mother was dead but later in the conversation she spoke about how she lives with her mother and how she pays half the bills and spoke about wanting to be independent and do her own thing. When asked about her taking her medications she shrugged her shoulders as if to report that she hasn't been taking them. When asked about the ACT team she reported that she did not have one. She denied any substance abuse issues. When she was escorted to the unit she became more agitated and spoke about how she was wanting to leave and became a bit confrontational in regards to it. She was able to respond appropriately to the re-direction provided for her and was able to go to her room without any further incident.

## 2018-05-12 NOTE — Plan of Care (Signed)
  Problem: Education: Goal: Emotional status will improve Outcome: Not Progressing   Problem: Activity: Goal: Interest or engagement in activities will improve Outcome: Not Progressing   Problem: Safety: Goal: Periods of time without injury will increase Outcome: Progressing   Problem: Health Behavior/Discharge Planning: Goal: Compliance with prescribed medication regimen will improve Outcome: Not Progressing  DAR NOTE: Patient appears paranoid, disorganized with tangential thought process.  Presents with anxious affect and mood.  Denies suicidal thoughts and pain.  Rates depression at 0, hopelessness at 0, and anxiety at 0.  Maintained on routine safety checks.  Support and encouragement offered as needed.  States goal for today is "outpatient."  Patient observed pacing the hallway while mumbling to self.  Patient is safe on the unit.

## 2018-05-12 NOTE — Progress Notes (Signed)
Recreation Therapy Notes  Date: 9.16.19 Time: 1000 Location: 500 Hall Dayroom  Group Topic: Coping Skills  Goal Area(s) Addresses:  Patient will be able to identify positive coping skills. Patient will be able to identify benefits of using coping skills post d/c.  Behavioral Response:  Minimal  Intervention: Worksheet, pencils  Activity: OrthoptistWeb Design.  Patients were to identify the things, people, situations they feel are holding them back and write them inside the web.  Patients were to then identify their coping skills and place them on the outside of the web.  Education: PharmacologistCoping Skills, Building control surveyorDischarge Planning.   Education Outcome: Acknowledges understanding/In group clarification offered/Needs additional education.   Clinical Observations/Feedback: Pt arrived late to group.  Pt identified some of her coping skills as fitness, music and finding a church.  Pt stated some of the things she was dealing with were her health and spirituality.     Caroll RancherMarjette Dawn Convery, LRT/CTRS      Lillia AbedLindsay, Angello Chien A 05/12/2018 11:40 AM

## 2018-05-13 MED ORDER — HALOPERIDOL DECANOATE 100 MG/ML IM SOLN
100.0000 mg | INTRAMUSCULAR | Status: DC
  Administered 2018-05-13: 100 mg via INTRAMUSCULAR
  Filled 2018-05-13: qty 1

## 2018-05-13 MED ORDER — TRAZODONE HCL 50 MG PO TABS
50.0000 mg | ORAL_TABLET | Freq: Every evening | ORAL | Status: DC | PRN
  Administered 2018-05-16: 50 mg via ORAL
  Filled 2018-05-13: qty 1

## 2018-05-13 MED ORDER — HYDROXYZINE HCL 50 MG PO TABS: 50.0000 mg | ORAL_TABLET | Freq: Four times a day (QID) | ORAL | Status: DC | PRN

## 2018-05-13 NOTE — Progress Notes (Signed)
Psychoeducational Group Note  Date:  05/13/2018 Time:  2057  Group Topic/Focus:  Wrap-Up Group:   The focus of this group is to help patients review their daily goal of treatment and discuss progress on daily workbooks.  Participation Level: Did Not Attend  Participation Quality:  Not Applicable  Affect:  Not Applicable  Cognitive:  Not Applicable  Insight:  Not Applicable  Engagement in Group: Not Applicable  Additional Comments:  The patient did not attend group and returned to her room.   Hazle CocaGOODMAN, Santasia Rew S 05/13/2018, 8:57 PM

## 2018-05-13 NOTE — Plan of Care (Signed)
  Problem: Health Behavior/Discharge Planning: Goal: Compliance with treatment plan for underlying cause of condition will improve Outcome: Progressing Note:  [name] was hesitant to take HS medications but finally agreed.

## 2018-05-13 NOTE — Progress Notes (Signed)
[facility]  05/13/2018 12:51 PM [name]  MRN:  [id number] Subjective:    [name] is a 32 y/o F with history of schizophrenia (elsewhere schizoaffective disorder bipolar type) who was admitted [facility] on IVC initiated in ED after she contacted emergency services with concern that her mother had a myocardial infarction and had died; however, when emergency services arrived they found pt's mother to be fine and pt was paranoid, agitated, and disorganized. Pt was transported to ED, and she remained paranoid and disorganized. Pt has reportedly [facility] been [facility] with discharge on 11/10/17. She was medically cleared and then [facility] and stabilization. She was started on trial of her previous medication of haldol.  Today upon evaluation, pt shares, "I'm good." She denies any specific concerns. She is sleeping well. Her appetite is good. She denies physical complaints. She denies SI/HI/AH/VH. She denies paranoia or concerns about her safety or anyone in her family's safety. She is tolerating being resumed on haldol well, and she denies any side effects or concerns. We discussed resuming haldol decanoate, and pt was in agreement to restart it today. She was in agreement with the above plan, and she had no further questions, comments, or concerns.  Principal Problem: Schizoaffective disorder, bipolar type (HCC) Diagnosis:   Patient Active Problem List   Diagnosis Date Noted  . Schizophrenia, paranoid type (HCC) [F20.0] 07/14/2017  . Somatic dysfunction of pelvic region [M99.05] 10/26/2014  . Paranoia (HCC) [F22]   . Schizoaffective disorder, bipolar type (HCC) [F25.0] 03/11/2012   Total Time spent with patient: 30 minutes  Past Psychiatric History: see H&P  Past Medical History:  Past Medical History:  Diagnosis Date  . Bipolar 1 disorder (HCC) 2011  . Paranoia (HCC)   .  Schizoaffective disorder (HCC)    History reviewed. No pertinent surgical history. Family History:  Family History  Problem Relation Age of Onset  . Cancer Father        lymphoma  . Hypertension Mother   . Diabetes Neg Hx   . Heart disease Neg Hx    Family Psychiatric  History: see H&P Social History:  Social History   Substance and Sexual Activity  Alcohol Use No   Comment: Pt denies      Social History   Substance and Sexual Activity  Drug Use No   Comment: Pt denies    Social History   Socioeconomic History  . Marital status: Single    Spouse name: Not on file  . Number of children: 0  . Years of education: some colle  . Highest education level: Not on file  Occupational History  . Occupation: [employer]     Comment: trying to start   Social Needs  . Financial resource strain: Not on file  . Food insecurity:    Worry: Not on file    Inability: Not on file  . Transportation needs:    Medical: Not on file    Non-medical: Not on file  Tobacco Use  . Smoking status: Never Smoker  . Smokeless tobacco: Never Used  Substance and Sexual Activity  . Alcohol use: No    Comment: Pt denies   . Drug use: No    Comment: Pt denies  . Sexual activity: Not Currently    Birth control/protection: Abstinence, None  Lifestyle  . Physical activity:    Days per week: Not on  file    Minutes per session: Not on file  . Stress: Not on file  Relationships  . Social connections:    Talks on phone: Not on file    Gets together: Not on file    Attends religious service: Not on file    Active member of club or organization: Not on file    Attends meetings of clubs or organizations: Not on file    Relationship status: Not on file  Other Topics Concern  . Not on file  Social History Narrative   Work or School: not working   Home Situation: lives with mother   Spiritual Beliefs: Christian   Lifestyle: no regular exercise; healthy diet         Additional Social History:                          Sleep: Good  Appetite:  Good  Current Medications: Current Facility-Administered Medications  Medication Dose Route Frequency Provider Last Rate Last Dose  . benztropine (COGENTIN) tablet 1 mg  1 mg Oral BID Micheal Likens, MD   1 mg at 05/13/18 0757  . diphenhydrAMINE (BENADRYL) capsule 50 mg  50 mg Oral Q8H PRN Rankin, Shuvon B, NP       Or  . diphenhydrAMINE (BENADRYL) injection 50 mg  50 mg Intramuscular Q6H PRN Rankin, Shuvon B, NP      . haloperidol (HALDOL) tablet 5 mg  5 mg Oral Q8H PRN Rankin, Shuvon B, NP       Or  . haloperidol lactate (HALDOL) injection 5 mg  5 mg Intramuscular Q8H PRN Rankin, Shuvon B, NP      . haloperidol (HALDOL) tablet 5 mg  5 mg Oral BID Jolyne Loa T, MD   5 mg at 05/13/18 0758  . haloperidol decanoate (HALDOL DECANOATE) 100 MG/ML injection 100 mg  100 mg Intramuscular Q30 days Micheal Likens, MD        Lab Results: No results found for this or any previous visit (from the past 48 hour(s)).  Blood Alcohol level:  Lab Results  Component Value Date   ETH <10 05/11/2018   ETH <10 10/31/2017    Metabolic Disorder Labs: Lab Results  Component Value Date   HGBA1C 5.5 11/02/2017   MPG 111.15 11/02/2017   No results found for: PROLACTIN Lab Results  Component Value Date   CHOL 179 07/27/2014   TRIG 49 07/27/2014   HDL 44 07/27/2014   CHOLHDL 5 06/21/2014   VLDL 10 07/27/2014   LDLCALC 125 (H) 07/27/2014   LDLCALC 100 (H) 06/21/2014    Physical Findings: AIMS: Facial and Oral Movements Muscles of Facial Expression: None, normal Lips and Perioral Area: None, normal Jaw: None, normal Tongue: None, normal,Extremity Movements Upper (arms, wrists, hands, fingers): None, normal Lower (legs, knees, ankles, toes): None, normal, Trunk Movements Neck, shoulders, hips: None, normal, Overall Severity Severity of abnormal movements (highest score from questions above): None,  normal Incapacitation due to abnormal movements: None, normal Patient's awareness of abnormal movements (rate only patient's report): No Awareness, Dental Status Current problems with teeth and/or dentures?: No Does patient usually wear dentures?: No  CIWA:    COWS:     Musculoskeletal: Strength & Muscle Tone: within normal limits Gait & Station: normal Patient leans: N/A  Psychiatric Specialty Exam: Physical Exam  Nursing note and vitals reviewed.   Review of Systems  Constitutional: Negative for chills and fever.  Respiratory:  Negative for cough and shortness of breath.   Cardiovascular: Negative for chest pain.  Gastrointestinal: Negative for abdominal pain, heartburn, nausea and vomiting.  Psychiatric/Behavioral: Negative for depression, hallucinations and suicidal ideas. The patient is not nervous/anxious and does not have insomnia.     Blood pressure 104/72, pulse (!) 125, temperature 99.1 F (37.3 C), temperature source Oral, resp. rate 18, height 5\' 4"  (1.626 m), weight 58.1 kg.Body mass index is 21.97 kg/m.  General Appearance: Casual  Eye Contact:  Good  Speech:  Clear and Coherent and Normal Rate  Volume:  Normal  Mood:  Euthymic  Affect:  Blunt and Flat  Thought Process:  Coherent and Goal Directed  Orientation:  Full (Time, Place, and Person)  Thought Content:  Logical  Suicidal Thoughts:  No  Homicidal Thoughts:  No  Memory:  Immediate;   Fair Recent;   Fair Remote;   Fair  Judgement:  Poor  Insight:  Lacking  Psychomotor Activity:  Normal  Concentration:  Concentration: Fair  Recall:  [employer] of Knowledge:  Fair  Language:  Fair  Akathisia:  No  Handed:    AIMS (if indicated):     Assets:  Resilience Social Support  ADL's:  Intact  Cognition:  WNL  Sleep:  Number of Hours: 6.75   Treatment Plan Summary: Daily contact with patient to assess and evaluate symptoms and progress in treatment and Medication management   -Continue inpatient  hospitalization  -Schizoaffective disorder, bipolar type   -Continue haldol 5mg  po BID  -Start haldol decanoate 100mg  IM q30 days (administer starting today 05/13/18)  -Anxiety   -Continue vistaril 50mg  po q6h prn anxiety  -Insomnia   -Continue trazodone 50mg  po qhs prn insomnia (may repeat x1 prn insomnia)  -Agitation   -Continue haldol 5mg  po/IM q6h prn agitation    -Continue benadryl 50mg  po/IM q8h prn agitation  -EPS   -Continue cogentin 1mg  po BID  -Encourage participation in groups and therapeutic milieu  -disposition planning will be ongoing  Micheal Likens, MD 05/13/2018, 12:51 PM

## 2018-05-13 NOTE — Progress Notes (Signed)
Recreation Therapy Notes  Date: 9.19.19 Time: 1000 Location: 500 Hall Dayroom   Group Topic: Communication, Team Building, Problem Solving  Goal Area(s) Addresses:  Patient will effectively work with peer towards shared goal.  Patient will identify skill used to make activity successful.  Patient will identify how skills used during activity can be used to reach post d/c goals.   Behavioral Response: Engaged  Intervention: STEM Activity   Activity: [place]. Patients were provided the following materials: 5 drinking straws, 5 rubber bands, 5 paper clips, 2 index cards, 2 drinking cups, and 2 toilet paper rolls. Using the provided materials patients were asked to build a launching mechanisms to launch a ping pong ball approximately 12 feet. Patients were divided into teams of 3-5.   Education:Social Skills, Discharge Planning.   Education Outcome: Acknowledges education/In group clarification offered/Needs additional education.   Clinical Observations/Feedback: Pt was active and engaged.  Pt worked well with her peer to complete the task.  Pt followed the instructions give to her by her peer.  Pt also made suggestions when prompted by her peer.  Pt and her peer were successful at getting the ping pong ball to launch.    Caroll RancherMarjette Hamna Asa, LRT/CTRS     Lillia AbedLindsay, Pecola Haxton A 05/13/2018 11:22 AM

## 2018-05-13 NOTE — [facility] Group Notes (Signed)
[facility] Group Notes:  (Nursing/MHT/Case Management/Adjunct)  Date:  05/13/2018  Time:  4:45 PM  Type of Therapy:  Psychoeducational Skills  Participation Level:  Did Not Attend   Audrie Lializabeth O Sicily Zaragoza 05/13/2018, 4:45 PM

## 2018-05-13 NOTE — [facility] Group Notes (Signed)
LCSW Group Therapy Note  05/13/2018 1:15pm  Type of Therapy/Topic:  Group Therapy:  Balance in Life  Participation Level:  Active  Description of Group:    This group will address the concept of balance and how it feels and looks when one is unbalanced. Patients will be encouraged to process areas in their lives that are out of balance and identify reasons for remaining unbalanced. Facilitators will guide patients in utilizing problem-solving interventions to address and correct the stressor making their life unbalanced. Understanding and applying boundaries will be explored and addressed for obtaining and maintaining a balanced life. Patients will be encouraged to explore ways to assertively make their unbalanced needs known to significant others in their lives, using other group members and facilitator for support and feedback.  Therapeutic Goals: 1. Patient will identify two or more emotions or situations they have that consume much of in their lives. 2. Patient will identify signs/triggers that life has become out of balance:  3. Patient will identify two ways to set boundaries in order to achieve balance in their lives:  4. Patient will demonstrate ability to communicate their needs through discussion and/or role plays  Summary of Patient Progress:  [name] attended the entire session and engaged in the activity. She described an emotion that may cause problems and stated that she can walk away if she needs to manage that emotion. Furthermore, shared that she was in some drama productins while in school, and recently was doing some "miming".  This brings her happiness.    Therapeutic Modalities:   Cognitive Behavioral Therapy Solution-Focused Therapy Assertiveness Training  Ida RogueRodney B Ewan Grau, [place]CS[place] 05/13/2018 2:14 PM

## 2018-05-13 NOTE — Plan of Care (Signed)
  Problem: Education: Goal: Emotional status will improve Outcome: Progressing   Problem: Safety: Goal: Periods of time without injury will increase Outcome: Progressing  DAR NOTE: Patient is guarded and paranoid.  Presents with blunted affect and  mood.  Denies suicidal thoughts, pain, auditory and visual hallucinations.  Rates depression at 0, hopelessness at 0, and anxiety at 0.  Maintained on routine safety checks.  Medications given as prescribed.  Support and encouragement offered as needed.  Attended group and participated.  States goal for today is "to be an outpatient and work on discharge plan."  Patient visible in milieu with minimal interactions. Offered no complaint.

## 2018-05-13 NOTE — Progress Notes (Signed)
D:  Zakariah has been isolative to her room this evening.  She denied any SI/HI but appears to be responding to internal stimuli.  She did not attend evening wrap up group.  She denied any pain or discomfort and appeared to be in no physical distress.  She did finally take her hs medication with much encouragement.  Hygiene remains poor and she was encouraged to take a shower.   A:  1:1 with RN for support and encouragement.  Medications as ordered.  Q 15 minute checks maintained for safety.  Encouraged participation in group and unit activities.   R:  Keylee remains safe on the unit.  We will continue to monitor the progress towards her goals.

## 2018-05-14 DIAGNOSIS — G4700 Insomnia, unspecified: Secondary | ICD-10-CM

## 2018-05-14 DIAGNOSIS — F419 Anxiety disorder, unspecified: Secondary | ICD-10-CM

## 2018-05-14 DIAGNOSIS — R451 Restlessness and agitation: Secondary | ICD-10-CM

## 2018-05-14 NOTE — [facility] Suicide Risk Assessment (Signed)
[facility] INPATIENT:  Family/Significant Other Suicide Prevention Education  Suicide Prevention Education:  Education Completed; No one has been identified by the patient as the family member/significant other with whom the patient will be residing, and identified as the person(s) who will aid the patient in the event of a mental health crisis (suicidal ideations/suicide attempt).  With written consent from the patient, the family member/significant other has been provided the following suicide prevention education, prior to the and/or following the discharge of the patient.  The suicide prevention education provided includes the following:  Suicide risk factors  Suicide prevention and interventions  National Suicide Hotline telephone number  [facility] assessment telephone number  [facility] Assistance 911  [facility] and/or [facility] telephone number  Request made of family/significant other to:  Remove weapons (e.g., guns, rifles, knives), all items previously/currently identified as safety concern.    Remove drugs/medications (over-the-counter, prescriptions, illicit drugs), all items previously/currently identified as a safety concern.  The family member/significant other verbalizes understanding of the suicide prevention education information provided.  The family member/significant other agrees to remove the items of safety concern listed above. The patient did not endorse SI at the time of admission, nor did the patient c/o SI during the stay here.  SPE not required.  Monique DaubRodney B Samaritan [name] 05/14/2018, 11:27 AM

## 2018-05-14 NOTE — Progress Notes (Signed)
Adult Psychoeducational Group Note  Date:  05/14/2018 Time:  8:46 PM  Group Topic/Focus:  Wrap-Up Group:   The focus of this group is to help patients review their daily goal of treatment and discuss progress on daily workbooks.  Participation Level:  Minimal  Participation Quality:  Appropriate  Affect:  Flat  Cognitive:  Appropriate  Insight: Improving  Engagement in Group:  Limited  Modes of Intervention:  Discussion  Additional Comments:  Pt stated that today was a good day. Her goal is to go to outpatient therapy and to gain more independence.   Kaleen OdeaCOOKE, Malyn Aytes R 05/14/2018, 8:46 PM

## 2018-05-14 NOTE — Progress Notes (Signed)
Recreation Therapy Notes  Date: 9.20.19 Time: 1000 Location: 500 Hall  Group Topic: Communication  Goal Area(s) Addresses:  Patient will effectively communicate with peers in group.  Patient will verbalize benefit of healthy communication.   Behavioral Response: Engaged  Intervention: Veterinary surgeonubber Discs  Activity: Hot Lava.  Each patient was given a rubber disc and the group was given one extra disc.  Patients were to use the discs to get each person from one end of the hall to the other and back to the starting point.    Education: Communication, Discharge Planning  Education Outcome: Acknowledges understanding/In group clarification offered/Needs additional education.   Clinical Observations/Feedback:  Pt was engaged. Pt would listen and take instruction from her peers very well.  Pt was able to focus throughout activity.     Caroll RancherMarjette Tiffiney Sparrow, LRT/CTRS         Caroll RancherLindsay, Sean Malinowski A 05/14/2018 12:43 PM

## 2018-05-14 NOTE — [facility] Group Notes (Signed)
LCSW Group Therapy Note  05/14/2018 1:15pm  Type of Therapy/Topic:  Group Therapy:  Feelings about Diagnosis  Participation Level:  Minimal   Description of Group:   This group will allow patients to explore their thoughts and feelings about diagnoses they have received. Patients will be guided to explore their level of understanding and acceptance of these diagnoses. Facilitator will encourage patients to process their thoughts and feelings about the reactions of others to their diagnosis and will guide patients in identifying ways to discuss their diagnosis with significant others in their lives. This group will be process-oriented, with patients participating in exploration of their own experiences, giving and receiving support, and processing challenge from other group members.   Therapeutic Goals: 1. Patient will demonstrate understanding of diagnosis as evidenced by identifying two or more symptoms of the disorder 2. Patient will be able to express two feelings regarding the diagnosis 3. Patient will demonstrate their ability to communicate their needs through discussion and/or role play  Summary of Patient Progress:  Stayed the entire time, engaged throughout.  Did not offer anything spontaneously, but willing to contribute when invited.  Vaguely related to subject at hand.     Therapeutic Modalities:   Cognitive Behavioral Therapy Brief Therapy Feelings Identification    Ida RogueRodney B Jessicamarie Amiri, LCSW 05/14/2018 3:22 PM

## 2018-05-14 NOTE — Plan of Care (Signed)
  Problem: Activity: Goal: Interest or engagement in activities will improve Outcome: Progressing   Problem: Safety: Goal: Periods of time without injury will increase Outcome: Progressing   Problem: Education: Goal: Emotional status will improve Outcome: Progressing  DAR NOTE: Patient presents with blunted affect and depressed mood.  Denies suicidal thoughts, pain, auditory and visual hallucinations.  Rates depression at 0, hopelessness at 0, and anxiety at 0.  Maintained on routine safety checks.  Medications given as prescribed.  Support and encouragement offered as needed.  Attended group and participated.  States goal for today is "work towards outpatient only."  Patient observed socializing with peers in the dayroom.  Offered no complaint.

## 2018-05-14 NOTE — Progress Notes (Signed)
D:  [name] mainly stays in her room and minimal interaction with staff or peers.  She did not attend evening wrap up group but was noted sitting in the day room later in the evening watching TV alone.  She denied SI/HI or A/V hallucinations.  She appears to be responding to internal stimuli as she was smiling at odd times.  She denied pain or discomfort and appeared to be in no physical distress.  She finally took her hs haldol PO because she was convinced that she had already taken the hs dosage and was worried about the injection she received today.  Reassured her that she will need to take the PO haldol until the injections starts working.  She is currently resting with her eyes closed and appears to be asleep. A:  1:1 with RN for support and encouragement.  Medications as ordered.  Q 15 minute checks maintained for safety.  Encouraged participation in group and unit activities.   R:  [name] remains safe on the unit.  We will continue to monitor the progress towards her goals.

## 2018-05-14 NOTE — Plan of Care (Signed)
  Problem: Activity: Goal: Sleeping patterns will improve Outcome: Progressing Note:  She slept better last night and is currently asleep.

## 2018-05-14 NOTE — Progress Notes (Signed)
[facility] MD Progress Note  05/14/2018 11:38 AM [name]  MRN:  [id number]  Subjective: [name] reports, "I'm waiting to be discharged between now & Monday. I am here because of a mixed up situation, a little bit of ringer monroe. you know what I mean; something is going on in the atmosphere but you don't know what it is. I have been doing great. I was suppose to be taken to [facility] for counseling, but here I am. I'm doing real good. I feel great. I'm taking college course from the App on my phone. It is kind of tough, but, it is going okay. It is a writing course".  [name] is a 32 y/o F with history of schizophrenia (elsewhere schizoaffective disorder bipolar type) who was admitted [facility] on IVC initiated in ED after she contacted emergency services with concern that her mother had a myocardial infarction and had died; however, when emergency services arrived they found pt's mother to be fine and pt was paranoid, agitated, and disorganized. Pt was transported to ED, and she remained paranoid and disorganized. Pt has reportedly not been following up with outpatient provider at [facility]. She has recent admission to [facility] with discharge on 11/10/17. She was medically cleared and then transferred to [facility] for additional treatment and stabilization. She was started on trial of her previous medication of haldol.  Today upon evaluation, pt shares, "I'm real good. I feel great". She denies any specific concerns. She is sleeping well. Her appetite is good. She denies physical complaints. She denies SI/HI/AH/VH. She denies paranoia or concerns about her safety or anyone in her family's safety. She is tolerating being resumed on haldol well, and she denies any side effects or concerns. The attending psychiatrist had discussed with patient about resuming her haldol decanoate, and pt was in agreement. It was restarted yesterday. She was in agreement with the above plan, and she had no further questions, comments, or  concerns.  Principal Problem: Schizoaffective disorder, bipolar type (HCC)  Diagnosis:   Patient Active Problem List   Diagnosis Date Noted  . Schizophrenia, paranoid type (HCC) [F20.0] 07/14/2017    Priority: High  . Somatic dysfunction of pelvic region [M99.05] 10/26/2014  . Paranoia (HCC) [F22]   . Schizoaffective disorder, bipolar type (HCC) [F25.0] 03/11/2012   Total Time spent with patient: 15 minutes  Past Psychiatric History: See H&P  Past Medical History:  Past Medical History:  Diagnosis Date  . Bipolar 1 disorder (HCC) 2011  . Paranoia (HCC)   . Schizoaffective disorder (HCC)    History reviewed. No pertinent surgical history. Family History:  Family History  Problem Relation Age of Onset  . Cancer Father        lymphoma  . Hypertension Mother   . Diabetes Neg Hx   . Heart disease Neg Hx    Family Psychiatric  History: See H&P  Social History:  Social History   Substance and Sexual Activity  Alcohol Use No   Comment: Pt denies      Social History   Substance and Sexual Activity  Drug Use No   Comment: Pt denies    Social History   Socioeconomic History  . Marital status: Single    Spouse name: Not on file  . Number of children: 0  . Years of education: some colle  . Highest education level: Not on file  Occupational History  . Occupation: [name]     Comment: trying to start   Social Needs  .  Financial resource strain: Not on file  . Food insecurity:    Worry: Not on file    Inability: Not on file  . Transportation needs:    Medical: Not on file    Non-medical: Not on file  Tobacco Use  . Smoking status: Never Smoker  . Smokeless tobacco: Never Used  Substance and Sexual Activity  . Alcohol use: No    Comment: Pt denies   . Drug use: No    Comment: Pt denies  . Sexual activity: Not Currently    Birth control/protection: Abstinence, None  Lifestyle  . Physical activity:    Days per week: Not on file    Minutes per session: Not  on file  . Stress: Not on file  Relationships  . Social connections:    Talks on phone: Not on file    Gets together: Not on file    Attends religious service: Not on file    Active member of club or organization: Not on file    Attends meetings of clubs or organizations: Not on file    Relationship status: Not on file  Other Topics Concern  . Not on file  Social History Narrative   Work or School: not working   Home Situation: lives with mother   Spiritual Beliefs: Christian   Lifestyle: no regular exercise; healthy diet         Additional Social History:   Sleep: Good  Appetite:  Good  Current Medications: Current Facility-Administered Medications  Medication Dose Route Frequency Provider Last Rate Last Dose  . benztropine (COGENTIN) tablet 1 mg  1 mg Oral BID Micheal Likensainville, Christopher T, MD   1 mg at 05/14/18 0749  . diphenhydrAMINE (BENADRYL) capsule 50 mg  50 mg Oral Q8H PRN Rankin, Shuvon B, NP       Or  . diphenhydrAMINE (BENADRYL) injection 50 mg  50 mg Intramuscular Q6H PRN Rankin, Shuvon B, NP      . haloperidol (HALDOL) tablet 5 mg  5 mg Oral Q8H PRN Rankin, Shuvon B, NP       Or  . haloperidol lactate (HALDOL) injection 5 mg  5 mg Intramuscular Q8H PRN Rankin, Shuvon B, NP      . haloperidol (HALDOL) tablet 5 mg  5 mg Oral BID Jolyne Loaainville, Christopher T, MD   5 mg at 05/14/18 0749  . haloperidol decanoate (HALDOL DECANOATE) 100 MG/ML injection 100 mg  100 mg Intramuscular Q30 days Jolyne Loaainville, Christopher T, MD   100 mg at 05/13/18 1302  . hydrOXYzine (ATARAX/VISTARIL) tablet 50 mg  50 mg Oral Q6H PRN Micheal Likensainville, Christopher T, MD      . traZODone (DESYREL) tablet 50 mg  50 mg Oral QHS PRN,MR X 1 Rainville, Christopher T, MD       Lab Results: No results found for this or any previous visit (from the past 48 hour(s)).  Blood Alcohol level:  Lab Results  Component Value Date   ETH <10 05/11/2018   ETH <10 10/31/2017   Metabolic Disorder Labs: Lab Results   Component Value Date   HGBA1C 5.5 11/02/2017   MPG 111.15 11/02/2017   No results found for: PROLACTIN Lab Results  Component Value Date   CHOL 179 07/27/2014   TRIG 49 07/27/2014   HDL 44 07/27/2014   CHOLHDL 5 06/21/2014   VLDL 10 07/27/2014   LDLCALC 125 (H) 07/27/2014   LDLCALC 100 (H) 06/21/2014   Physical Findings: AIMS: Facial and Oral Movements Muscles of Facial  Expression: None, normal Lips and Perioral Area: None, normal Jaw: None, normal Tongue: None, normal,Extremity Movements Upper (arms, wrists, hands, fingers): None, normal Lower (legs, knees, ankles, toes): None, normal, Trunk Movements Neck, shoulders, hips: None, normal, Overall Severity Severity of abnormal movements (highest score from questions above): None, normal Incapacitation due to abnormal movements: None, normal Patient's awareness of abnormal movements (rate only patient's report): No Awareness, Dental Status Current problems with teeth and/or dentures?: No Does patient usually wear dentures?: No  CIWA:    COWS:     Musculoskeletal: Strength & Muscle Tone: within normal limits Gait & Station: normal Patient leans: N/A  Psychiatric Specialty Exam: Physical Exam  Nursing note and vitals reviewed.   Review of Systems  Constitutional: Negative for chills and fever.  Respiratory: Negative for cough and shortness of breath.   Cardiovascular: Negative for chest pain.  Gastrointestinal: Negative for abdominal pain, heartburn, nausea and vomiting.  Psychiatric/Behavioral: Negative for depression, hallucinations and suicidal ideas. The patient is not nervous/anxious and does not have insomnia.     Blood pressure 104/72, pulse (!) 125, temperature 99.1 F (37.3 C), temperature source Oral, resp. rate 18, height 5\' 4"  (1.626 m), weight 58.1 kg.Body mass index is 21.97 kg/m.  General Appearance: Casual  Eye Contact:  Good  Speech:  Clear and Coherent and Normal Rate  Volume:  Normal  Mood:   Euthymic  Affect:  Blunt and Flat  Thought Process:  Coherent and Goal Directed  Orientation:  Full (Time, Place, and Person)  Thought Content:  Logical  Suicidal Thoughts:  No  Homicidal Thoughts:  No  Memory:  Immediate;   Fair Recent;   Fair Remote;   Fair  Judgement:  Poor  Insight:  Lacking  Psychomotor Activity:  Normal  Concentration:  Concentration: Fair  Recall:  [employer] of Knowledge:  Fair  Language:  Fair  Akathisia:  No  Handed:    AIMS (if indicated):     Assets:  Resilience Social Support  ADL's:  Intact  Cognition:  WNL  Sleep:  Number of Hours: 6.5   Treatment Plan Summary: Daily contact with patient to assess and evaluate symptoms and progress in treatment and Medication management   -Continue inpatient hospitalization.  -Will continue today 05/14/2018 plan as below except where it is noted.  -Schizoaffective disorder, bipolar type   -Continue haldol 5 mg po BID  -Continue haldol decanoate 100 mg IM q30 days (administer starting today 05/13/18)  -Anxiety   -Continue vistaril 50 mg po q6h prn anxiety  -Insomnia   -Continue trazodone 50mg  po qhs prn insomnia (may repeat x1 prn insomnia)  -Agitation   -Continue haldol 5 mg po/IM q6h prn agitation    -Continue benadryl 50 mg po/IM q8h prn agitation  -EPS   -Continue cogentin 1mg  po BID  -Encourage participation in groups and therapeutic milieu  -disposition planning will be ongoing  Armandina Stammer, NP, PMHNP, FNP-BC 05/14/2018, 11:38 AMPatient ID: [name], female   DOB: January 27, 1986, 32 y.o.   MRN: [id number]

## 2018-05-15 NOTE — Plan of Care (Signed)
  Problem: Activity: Goal: Interest or engagement in activities will improve Outcome: Progressing   Problem: Safety: Goal: Periods of time without injury will increase Outcome: Progressing  DAR NOTE: Patient presents with blunted affect and mood.  Denies suicidal thoughts, pain, auditory and visual hallucinations.  Rates depression at 0, hopelessness at 0, and anxiety at 0.  Maintained on routine safety checks.  Medications given as prescribed.  Support and encouragement offered as needed.  Attended group and participated.  States goal for today is "to remain as outpatient only."  Patient observed socializing with peers in the dayroom.  Offered no complaint.

## 2018-05-15 NOTE — [facility] Group Notes (Signed)
  [facility] LCSW Group Therapy Note  Date/Time:  05/15/2018 11:15AM-12:00PM  Type of Therapy and Topic:  Group Therapy:  Feelings About Hospitalization  Participation Level:  Did Not Attend   Description of Group This process group involved patients discussing their feelings related to being hospitalized, as well as the benefits they see to being in the hospital.  These feelings and benefits were itemized.  The group then brainstormed specific ways in which they could seek those same benefits when they discharge and return home.  Therapeutic Goals 1. Patient will identify and describe positive and negative feelings related to hospitalization 2. Patient will verbalize benefits of hospitalization to themselves personally 3. Patients will brainstorm together ways they can obtain similar benefits in the outpatient setting, identify barriers to wellness and possible solutions  Summary of Patient Progress: N/A  Therapeutic Modalities Cognitive Behavioral Therapy Motivational Interviewing    Ambrose MantleMareida Grossman-Orr, LCSW 05/15/2018, 8:03 AM  ;l

## 2018-05-15 NOTE — Progress Notes (Signed)
  D: Pt was playing on bed prior to the assessment. When asked about her day pt stated, "good". When asked the circumstances surrounding her adm pt stated, "I don't know". Writer informed pt of scheduled med. Pt informed Clinical research associatewriter that she had taken medications from the "other nurse". Explained that she was "ok" and didn't need to take any medications. Pt reminded pt that she had taken it from the previous RN only, but was also scheduled to take medication from the writer. After several mins pt reluctantly agreed to take her hs med. Pt has no questions or concerns.    A:  Support and encouragement was offered. 15 min checks continued for safety.  R: Pt remains safe.

## 2018-05-15 NOTE — Progress Notes (Signed)
[facility] MD Progress Note  05/15/2018 10:27 AM [name]  MRN:  [id number]  Evaluation: Aanvi observed sitting in day room responding to internal stimuli.  Presents the disheveled but cooperative.  Nursing staff reported that patient has refused daily medication as she states she is already receiving her monthly injection.  Patient appears to be paranoid with medications as she states that the medication is a different color.  Continues to need encouragement with ADLs and medications.  Reports she is hopeful to discharge soon as she has been doing as she was told.  Denies depression or depressive symptoms.  Denies suicidal or homicidal ideations.  Denies auditory visual hallucinations.  Support encouragement reassurance was provided.     History: [name] is a 32 y/o F with history of schizophrenia (elsewhere schizoaffective disorder bipolar type) who was admitted [facility] on IVC initiated in ED after she contacted emergency services with concern that her mother had a myocardial infarction and had died; however, when emergency services arrived they found pt's mother to be fine and pt was paranoid, agitated, and disorganized. Pt was transported to ED, and she remained paranoid and disorganized. Pt has reportedly not been following up with outpatient provider at [facility]. She has recent admission to [facility] with discharge on 11/10/17. She was medically cleared and then transferred to [facility] for additional treatment and stabilization. She was started on trial of her previous medication of haldol.   Principal Problem: Schizoaffective disorder, bipolar type (HCC)  Diagnosis:   Patient Active Problem List   Diagnosis Date Noted  . Schizophrenia, paranoid type (HCC) [F20.0] 07/14/2017  . Somatic dysfunction of pelvic region [M99.05] 10/26/2014  . Paranoia (HCC) [F22]   . Schizoaffective disorder, bipolar type (HCC) [F25.0] 03/11/2012   Total Time spent with patient: 15 minutes  Past Psychiatric History: See  H&P  Past Medical History:  Past Medical History:  Diagnosis Date  . Bipolar 1 disorder (HCC) 2011  . Paranoia (HCC)   . Schizoaffective disorder (HCC)    History reviewed. No pertinent surgical history. Family History:  Family History  Problem Relation Age of Onset  . Cancer Father        lymphoma  . Hypertension Mother   . Diabetes Neg Hx   . Heart disease Neg Hx    Family Psychiatric  History: See H&P  Social History:  Social History   Substance and Sexual Activity  Alcohol Use No   Comment: Pt denies      Social History   Substance and Sexual Activity  Drug Use No   Comment: Pt denies    Social History   Socioeconomic History  . Marital status: Single    Spouse name: Not on file  . Number of children: 0  . Years of education: some colle  . Highest education level: Not on file  Occupational History  . Occupation: [name]     Comment: trying to start   Social Needs  . Financial resource strain: Not on file  . Food insecurity:    Worry: Not on file    Inability: Not on file  . Transportation needs:    Medical: Not on file    Non-medical: Not on file  Tobacco Use  . Smoking status: Never Smoker  . Smokeless tobacco: Never Used  Substance and Sexual Activity  . Alcohol use: No    Comment: Pt denies   . Drug use: No    Comment: Pt denies  . Sexual activity: Not Currently  Birth control/protection: Abstinence, None  Lifestyle  . Physical activity:    Days per week: Not on file    Minutes per session: Not on file  . Stress: Not on file  Relationships  . Social connections:    Talks on phone: Not on file    Gets together: Not on file    Attends religious service: Not on file    Active member of club or organization: Not on file    Attends meetings of clubs or organizations: Not on file    Relationship status: Not on file  Other Topics Concern  . Not on file  Social History Narrative   Work or School: not working   Home Situation: lives with  mother   Spiritual Beliefs: Christian   Lifestyle: no regular exercise; healthy diet         Additional Social History:   Sleep: Good  Appetite:  Good  Current Medications: Current Facility-Administered Medications  Medication Dose Route Frequency Provider Last Rate Last Dose  . benztropine (COGENTIN) tablet 1 mg  1 mg Oral BID Micheal Likensainville, Christopher T, MD   1 mg at 05/14/18 1708  . diphenhydrAMINE (BENADRYL) capsule 50 mg  50 mg Oral Q8H PRN Rankin, Shuvon B, NP       Or  . diphenhydrAMINE (BENADRYL) injection 50 mg  50 mg Intramuscular Q6H PRN Rankin, Shuvon B, NP      . haloperidol (HALDOL) tablet 5 mg  5 mg Oral Q8H PRN Rankin, Shuvon B, NP       Or  . haloperidol lactate (HALDOL) injection 5 mg  5 mg Intramuscular Q8H PRN Rankin, Shuvon B, NP      . haloperidol (HALDOL) tablet 5 mg  5 mg Oral BID Jolyne Loaainville, Christopher T, MD   5 mg at 05/14/18 2319  . haloperidol decanoate (HALDOL DECANOATE) 100 MG/ML injection 100 mg  100 mg Intramuscular Q30 days Jolyne Loaainville, Christopher T, MD   100 mg at 05/13/18 1302  . hydrOXYzine (ATARAX/VISTARIL) tablet 50 mg  50 mg Oral Q6H PRN Micheal Likensainville, Christopher T, MD      . traZODone (DESYREL) tablet 50 mg  50 mg Oral QHS PRN,MR X 1 [place], Christopher T, MD       Lab Results: No results found for this or any previous visit (from the past 48 hour(s)).  Blood Alcohol level:  Lab Results  Component Value Date   ETH <10 05/11/2018   ETH <10 10/31/2017   Metabolic Disorder Labs: Lab Results  Component Value Date   HGBA1C 5.5 11/02/2017   MPG 111.15 11/02/2017   No results found for: PROLACTIN Lab Results  Component Value Date   CHOL 179 07/27/2014   TRIG 49 07/27/2014   HDL 44 07/27/2014   CHOLHDL 5 06/21/2014   VLDL 10 07/27/2014   LDLCALC 125 (H) 07/27/2014   LDLCALC 100 (H) 06/21/2014   Physical Findings: AIMS: Facial and Oral Movements Muscles of Facial Expression: None, normal Lips and Perioral Area: None, normal Jaw: None,  normal Tongue: None, normal,Extremity Movements Upper (arms, wrists, hands, fingers): None, normal Lower (legs, knees, ankles, toes): None, normal, Trunk Movements Neck, shoulders, hips: None, normal, Overall Severity Severity of abnormal movements (highest score from questions above): None, normal Incapacitation due to abnormal movements: None, normal Patient's awareness of abnormal movements (rate only patient's report): No Awareness, Dental Status Current problems with teeth and/or dentures?: No Does patient usually wear dentures?: No  CIWA:    COWS:     Musculoskeletal:  Strength & Muscle Tone: within normal limits Gait & Station: normal Patient leans: N/A  Psychiatric Specialty Exam: Physical Exam  Nursing note and vitals reviewed. Constitutional: She is oriented to person, place, and time. She appears well-developed.  Cardiovascular: Normal rate.  Neurological: She is alert and oriented to person, place, and time.  Psychiatric: She has a normal mood and affect. Her behavior is normal.    Review of Systems  Psychiatric/Behavioral: Negative for depression, hallucinations and suicidal ideas. The patient is nervous/anxious. The patient does not have insomnia.   All other systems reviewed and are negative.   Blood pressure 104/72, pulse (!) 125, temperature 99.1 F (37.3 C), temperature source Oral, resp. rate 18, height 5\' 4"  (1.626 m), weight 58.1 kg.Body mass index is 21.97 kg/m.  General Appearance: Guarded  Eye Contact:  Good  Speech:  Normal Rate  Volume:  Normal  Mood:  Euthymic  Affect:  Blunt and Flat  Thought Process:  Coherent and Goal Directed  Orientation:  Full (Time, Place, and Person)  Thought Content:  Logical  Suicidal Thoughts:  No  Homicidal Thoughts:  No  Memory:  Immediate;   Fair Recent;   Fair Remote;   Fair  Judgement:  Poor  Insight:  Lacking  Psychomotor Activity:  Normal  Concentration:  Concentration: Fair  Recall:  [employer] of  Knowledge:  Fair  Language:  Fair  Akathisia:  No  Handed:    AIMS (if indicated):     Assets:  Resilience Social Support  ADL's:  Intact  Cognition:  WNL  Sleep:  Number of Hours: 5   Treatment Plan Summary: Daily contact with patient to assess and evaluate symptoms and progress in treatment and Medication management   -Will continue today 05/15/2018 plan as below except where it is noted.  -Schizoaffective disorder, bipolar type   -Continue haldol 5 mg po BID  -Continue haldol decanoate 100 mg IM q30 days (administer starting today 05/13/18)  -Anxiety   -Continue vistaril 50 mg po q6h prn anxiety  -Insomnia   -Continue trazodone 50mg  po qhs prn insomnia (may repeat x1 prn insomnia)  -Agitation   -Continue haldol 5 mg po/IM q6h prn agitation    -Continue benadryl 50 mg po/IM q8h prn agitation  -EPS   -Continue cogentin 1mg  po BID  -Encourage participation in groups and therapeutic milieu -disposition planning will be ongoing  Oneta Rack, NP,  05/15/2018, 10:27 AM

## 2018-05-15 NOTE — [facility] Group Notes (Signed)
[facility] Group Notes:  (Nursing/MHT/Case Management/Adjunct)  Date:  05/15/2018  Time:  11:06 AM  Type of Therapy:  Psychoeducational Skills  Participation Level:  Active  Participation Quality:  Appropriate and Attentive  Affect:  Appropriate  Cognitive:  Alert and Appropriate  Insight:  Appropriate and Good  Engagement in Group:  Engaged  Modes of Intervention:  Discussion and Support  Summary of Progress/Problems:  Discussed Journey to recovery.  Patient was attentive, contributed and receptive.  [name] 05/15/2018, 11:06 AM

## 2018-05-16 NOTE — Progress Notes (Signed)
D: Pt attended group and was laying in bed prior to the assessment. Informed the writer that she may be scheduled for discharge tomorrow. Writer continued to assess however, pt shooed the Clinical research associatewriter away as if she was annoyed. Writer asked pt to come get her night scheduled meds. Pt continues to be suspicious of her medications. Pt has no questions or concerns.    A:  Support and encouragement was offered. 15 min checks continued for safety.  R: Pt remains safe.

## 2018-05-16 NOTE — [facility] Group Notes (Signed)
[facility] Group Notes:  (Nursing/MHT/Case Management/Adjunct)  Date:  05/16/2018  Time:  10:27 AM  Type of Therapy:  Patient self inventory group and goals group  Participation Level:  Active  Participation Quality:  Inattentive  Affect:  Anxious and Resistant  Cognitive:  Disorganized  Insight:  Lacking  Engagement in Group:  Lacking  Modes of Intervention:  Education  Summary of Progress/Problems: Pt did attend patient self inventory group and goals group.   Abbygayle Helfand Shanta 05/16/2018, 10:27 AM 

## 2018-05-16 NOTE — Progress Notes (Signed)
Pt presents with an animated affect and anxious mood. Pt appeared to be suspicious during shift assessment. Pt thoughts are disorganized. Pt cautious during shift assessment and forwarded little information. Pt denied SI/HI. Pt denied AVH. Pt denied depression and anxiety. Pt noted to be hesitant during medication administration but with encouragement pt did take her scheduled meds. Pt did not attempt to cheek meds.  Pt have minimal interaction on the unit but with encouragement, pt will attend scheduled groups.  Medications reviewed with pt. Verbal support provided. Pt encouraged to attend groups. 15 minute checks performed for safety.   Pt compliant with tx plan.

## 2018-05-16 NOTE — [facility] Group Notes (Signed)
[facility] Group Notes:  (Nursing/MHT/Case Management/Adjunct)  Date:  05/16/2018  Time:  10:32 AM  Type of Therapy:  Psychoeducational Skills  Participation Level:  Minimal  Participation Quality:  Resistant  Affect:  Resistant  Cognitive:  Disorganized  Insight:  Lacking  Engagement in Group:  Lacking  Modes of Intervention:  Problem-solving  Summary of Progress/Problems: Pt attended Psychoeducational group with topic healthy support system.     [name] 05/16/2018, 10:32 AM 

## 2018-05-16 NOTE — Progress Notes (Signed)
[facility]  05/16/2018 9:35 AM [name]  MRN:  [id number]  Evaluation: [name] observed sitting in dayroom interacting with peers. Reports feeling better today and feels ready to discharge. Reports she has been attending group session and states they played a game and " it was symbolic for steps to change your life". Shavaughn continues to deny depression or depressive symptoms. Reports taken medications as prescribed and tolerating medications well. Reports a goo appetite and states she is resting well throughout the night. Denies suicidal or homicidal ideations.  Denies auditory visual hallucinations. Per staff patient is medication complain. Support encouragement reassurance was provided.     History: [name] is a 32 y/o F with history of schizophrenia (elsewhere schizoaffective disorder bipolar type) who was admitted [facility] on IVC initiated in ED after she contacted emergency services with concern that her mother had a myocardial infarction and had died; however, when emergency services arrived they found pt's mother to be fine and pt was paranoid, agitated, and disorganized. Pt was transported to ED, and she remained paranoid and disorganized. Pt has reportedly not been [facility] She has recent admission to [facility] with discharge on 11/10/17. She was medically cleared and [facility]. She was started on trial of her previous medication of haldol.   Principal Problem: Schizoaffective disorder, bipolar type (HCC)  Diagnosis:   Patient Active Problem List   Diagnosis Date Noted  . Schizophrenia, paranoid type (HCC) [F20.0] 07/14/2017  . Somatic dysfunction of pelvic region [M99.05] 10/26/2014  . Paranoia (HCC) [F22]   . Schizoaffective disorder, bipolar type (HCC) [F25.0] 03/11/2012   Total Time spent with patient: 15 minutes  Past Psychiatric History: See H&P  Past Medical History:  Past  Medical History:  Diagnosis Date  . Bipolar 1 disorder (HCC) 2011  . Paranoia (HCC)   . Schizoaffective disorder (HCC)    History reviewed. No pertinent surgical history. Family History:  Family History  Problem Relation Age of Onset  . Cancer Father        lymphoma  . Hypertension Mother   . Diabetes Neg Hx   . Heart disease Neg Hx    Family Psychiatric  History: See H&P  Social History:  Social History   Substance and Sexual Activity  Alcohol Use No   Comment: Pt denies      Social History   Substance and Sexual Activity  Drug Use No   Comment: Pt denies    Social History   Socioeconomic History  . Marital status: Single    Spouse name: Not on file  . Number of children: 0  . Years of education: some colle  . Highest education level: Not on file  Occupational History  . Occupation: [employer]     Comment: trying to start   Social Needs  . Financial resource strain: Not on file  . Food insecurity:    Worry: Not on file    Inability: Not on file  . Transportation needs:    Medical: Not on file    Non-medical: Not on file  Tobacco Use  . Smoking status: Never Smoker  . Smokeless tobacco: Never Used  Substance and Sexual Activity  . Alcohol use: No    Comment: Pt denies   . Drug use: No    Comment: Pt denies  . Sexual activity: Not Currently    Birth control/protection: Abstinence, None  Lifestyle  . Physical  activity:    Days per week: Not on file    Minutes per session: Not on file  . Stress: Not on file  Relationships  . Social connections:    Talks on phone: Not on file    Gets together: Not on file    Attends religious service: Not on file    Active member of club or organization: Not on file    Attends meetings of clubs or organizations: Not on file    Relationship status: Not on file  Other Topics Concern  . Not on file  Social History Narrative   Work or School: not working   Home Situation: lives with mother   Spiritual Beliefs:  Christian   Lifestyle: no regular exercise; healthy diet         Additional Social History:   Sleep: Good  Appetite:  Good  Current Medications: Current Facility-Administered Medications  Medication Dose Route Frequency Provider Last Rate Last Dose  . benztropine (COGENTIN) tablet 1 mg  1 mg Oral BID Micheal Likens, MD   1 mg at 05/16/18 0806  . diphenhydrAMINE (BENADRYL) capsule 50 mg  50 mg Oral Q8H PRN Rankin, Shuvon B, NP       Or  . diphenhydrAMINE (BENADRYL) injection 50 mg  50 mg Intramuscular Q6H PRN Rankin, Shuvon B, NP      . haloperidol (HALDOL) tablet 5 mg  5 mg Oral Q8H PRN Rankin, Shuvon B, NP       Or  . haloperidol lactate (HALDOL) injection 5 mg  5 mg Intramuscular Q8H PRN Rankin, Shuvon B, NP      . haloperidol (HALDOL) tablet 5 mg  5 mg Oral BID Jolyne Loa T, MD   5 mg at 05/16/18 0806  . haloperidol decanoate (HALDOL DECANOATE) 100 MG/ML injection 100 mg  100 mg Intramuscular Q30 days Jolyne Loa T, MD   100 mg at 05/13/18 1302  . hydrOXYzine (ATARAX/VISTARIL) tablet 50 mg  50 mg Oral Q6H PRN Micheal Likens, MD      . traZODone (DESYREL) tablet 50 mg  50 mg Oral QHS PRN,MR X 1 [place], Christopher T, MD       Lab Results: No results found for this or any previous visit (from the past 48 hour(s)).  Blood Alcohol level:  Lab Results  Component Value Date   ETH <10 05/11/2018   ETH <10 10/31/2017   Metabolic Disorder Labs: Lab Results  Component Value Date   HGBA1C 5.5 11/02/2017   MPG 111.15 11/02/2017   No results found for: PROLACTIN Lab Results  Component Value Date   CHOL 179 07/27/2014   TRIG 49 07/27/2014   HDL 44 07/27/2014   CHOLHDL 5 06/21/2014   VLDL 10 07/27/2014   LDLCALC 125 (H) 07/27/2014   LDLCALC 100 (H) 06/21/2014   Physical Findings: AIMS: Facial and Oral Movements Muscles of Facial Expression: None, normal Lips and Perioral Area: None, normal Jaw: None, normal Tongue: None,  normal,Extremity Movements Upper (arms, wrists, hands, fingers): None, normal Lower (legs, knees, ankles, toes): None, normal, Trunk Movements Neck, shoulders, hips: None, normal, Overall Severity Severity of abnormal movements (highest score from questions above): None, normal Incapacitation due to abnormal movements: None, normal Patient's awareness of abnormal movements (rate only patient's report): No Awareness, Dental Status Current problems with teeth and/or dentures?: No Does patient usually wear dentures?: No  CIWA:    COWS:     Musculoskeletal: Strength & Muscle Tone: within normal limits Gait &  Station: normal Patient leans: N/A  Psychiatric Specialty Exam: Physical Exam  Nursing note and vitals reviewed. Constitutional: She is oriented to person, place, and time. She appears well-developed.  Cardiovascular: Normal rate.  Neurological: She is alert and oriented to person, place, and time.  Psychiatric: She has a normal mood and affect. Her behavior is normal.    Review of Systems  Psychiatric/Behavioral: Negative for depression, hallucinations and suicidal ideas. The patient is nervous/anxious. The patient does not have insomnia.   All other systems reviewed and are negative.   Blood pressure 100/74, pulse 83, temperature 98.9 F (37.2 C), temperature source Oral, resp. rate 18, height 5\' 4"  (1.626 m), weight 58.1 kg.Body mass index is 21.97 kg/m.  General Appearance: Casual appearances has improved compared to 05/15/2018   Eye Contact:  Good  Speech:  Normal Rate  Volume:  Normal  Mood:  Euthymic  Affect:  Blunt and Flat  Thought Process:  Coherent and Goal Directed  Orientation:  Full (Time, Place, and Person)  Thought Content:  Logical  Suicidal Thoughts:  No  Homicidal Thoughts:  No  Memory:  Immediate;   Fair Recent;   Fair Remote;   Fair  Judgement:  Poor  Insight:  Lacking  Psychomotor Activity:  Normal  Concentration:  Concentration: Fair  Recall:   [employer] of Knowledge:  Fair  Language:  Fair  Akathisia:  No  Handed:    AIMS (if indicated):     Assets:  Resilience Social Support  ADL's:  Intact  Cognition:  WNL  Sleep:  Number of Hours: 3.5   Treatment Plan Summary: Daily contact with patient to assess and evaluate symptoms and progress in treatment and Medication management   -Will continue today 05/16/2018 plan as below except where it is noted.  -Schizoaffective disorder, bipolar type   -Continue haldol 5 mg po BID  -Continue haldol decanoate 100 mg IM q30 days (administer starting today 05/13/18)  -Anxiety   -Continue vistaril 50 mg po q6h prn anxiety  -Insomnia   -Continue trazodone 50mg  po qhs prn insomnia (may repeat x1 prn insomnia)  -Agitation   -Continue haldol 5 mg po/IM q6h prn agitation    -Continue benadryl 50 mg po/IM q8h prn agitation  -EPS   -Continue cogentin 1mg  po BID  -Encourage participation in groups and therapeutic milieu -disposition planning will be ongoing  Oneta Rack, NP,  05/16/2018, 9:35 AM

## 2018-05-16 NOTE — [facility] Group Notes (Signed)
[facility] LCSW Group Therapy Note  Date/Time:  05/16/2018  11:00AM-12:00PM  Type of Therapy and Topic:  Group Therapy:  Music and Mood  Participation Level:  Active   Description of Group: In this process group, members listened to a variety of genres of music and identified that different types of music evoke different responses.  Patients were encouraged to identify music that was soothing for them and music that was energizing for them.  Patients discussed how this knowledge can help with wellness and recovery in various ways including managing depression and anxiety as well as encouraging healthy sleep habits.    Therapeutic Goals: 1. Patients will explore the impact of different varieties of music on mood 2. Patients will verbalize the thoughts they have when listening to different types of music 3. Patients will identify music that is soothing to them as well as music that is energizing to them 4. Patients will discuss how to use this knowledge to assist in maintaining wellness and recovery 5. Patients will explore the use of music as a coping skill  Summary of Patient Progress:  At the beginning of group, patient expressed that she felt tired.  During group she was interactive and responsive in sharing how songs made her feel.  She also at times appeared to be responding to internal stimuli, laughing spontaneously and privately.  At the end of group she said she felt "at ease."  Therapeutic Modalities: Solution Focused Brief Therapy Activity   Ambrose MantleMareida Grossman-Orr, LCSW

## 2018-05-16 NOTE — Progress Notes (Signed)
fAdult Psychoeducational Group Note  Date:  05/16/2018 Time:  1:29 AM  Group Topic/Focus:  Wrap-Up Group:   The focus of this group is to help patients review their daily goal of treatment and discuss progress on daily workbooks.  Participation Level:  Minimal  Participation Quality:  Appropriate  Affect:  Anxious  Cognitive:  Alert and Appropriate  Insight: Improving  Engagement in Group:  Limited  Modes of Intervention:  Discussion  Additional Comments:  Pt smiled initially to began talking. Informed her day was rated an 8/10. Stated she wanted to meet with her Dr's and she did. Pt then stopped talking, covered her mouth and muttered not use to speaking in a group.   Fransico MichaelBrooks, Lea Baine Laverne 05/16/2018, 1:29 AM

## 2018-05-16 NOTE — [facility] Group Notes (Signed)
[facility] Group Notes:  (Nursing/MHT/Case Management/Adjunct)  Date:  05/16/2018  Time:  2:39 PM  Type of Therapy:  Psychoeducational Skills  Participation Level:  Minimal  Participation Quality:  Resistant  Affect:  Resistant  Cognitive:  Disorganized  Insight:  Lacking  Engagement in Group:  Lacking  Modes of Intervention:  Problem-solving  Summary of Progress/Problems: Pt attended Psychoeducational group with topic healthy support systems.   Marilene Vath Shanta 05/16/2018, 2:39 PM 

## 2018-05-17 MED ORDER — BENZTROPINE MESYLATE 1 MG PO TABS: 1.0000 mg | ORAL_TABLET | Freq: Two times a day (BID) | ORAL | 0 refills | Status: DC

## 2018-05-17 MED ORDER — TRAZODONE HCL 50 MG PO TABS: 50.0000 mg | ORAL_TABLET | Freq: Every evening | ORAL | 0 refills | Status: DC | PRN

## 2018-05-17 MED ORDER — HALOPERIDOL 5 MG PO TABS: 5.0000 mg | ORAL_TABLET | Freq: Two times a day (BID) | ORAL | 0 refills | Status: DC

## 2018-05-17 MED ORDER — HYDROXYZINE HCL 50 MG PO TABS: 50.0000 mg | ORAL_TABLET | Freq: Four times a day (QID) | ORAL | 0 refills | Status: DC | PRN

## 2018-05-17 NOTE — Progress Notes (Signed)
Patient ID: Monique LeydenPansy P Dawkins, female   DOB: 09/15/1985, 32 y.o.   MRN: [id number]  D: Patient is calm and cooperative this morning, denies SI/HI/AVH, reports that her sleep quality last night was good, reports a good appetite, reports that her energy level is normal, and reports a good concentration level.  Pt denies feeling of hopeless, depression or anxiety.  A: Pt given all of her morning meds as scheduled, and is being maintained on Q15 minute checks for safety.   D: Will continue to monitor on Q15 minute safety checks.

## 2018-05-17 NOTE — Progress Notes (Signed)
Patient ID: [name], female   DOB: 07/13/1986, 32 y.o.   MRN: [id number] Patient educated on all of her discharge instructions and verba/lized understanding.  Patient left hospital with all of her personal belongings, discharge instructions and prescription scripts. Transportation home was provided by her mother who met her at the lobby to the hospital.

## 2018-05-17 NOTE — Progress Notes (Signed)
  [facility] Discharge Plan :  Will you be returning to the same living situation after discharge:  Yes,  home At discharge, do you have transportation home?: Yes,  family Do you have the ability to pay for your medications: Yes,  MCD  Release of information consent forms completed and in the chart;  Patient's signature needed at discharge.  Patient to Follow up at: Follow-up Information    [facility] Follow up on 05/20/2018.   Why:  Thursday at 8:15 for your hospital follow up appointment Contact information: [address] [place] [address] [phone]           Next level of care provider [facility] Link:no  Safety Planning and Suicide Prevention discussed: Yes,  yes  Have you used any form of tobacco in the last 30 days? (Cigarettes, Smokeless Tobacco, Cigars, and/or Pipes): No  Has patient been referred to the Quitline?: N/A patient is not a smoker  Patient has been referred for addiction treatment: N/A  Ida RogueRodney B Maylea Soria, LCSW 05/17/2018, 9:03 AM

## 2018-05-17 NOTE — Plan of Care (Signed)
Pt attended and participated in recreation therapy group sessions.   Constanza Mincy, LRT/CTRS 

## 2018-05-17 NOTE — Progress Notes (Signed)
Recreation Therapy Notes  Date: 9.23.19 Time: 1000 Location: 500 [address] Dayroom  Group Topic: Coping Skills  Goal Area(s) Addresses:  Patient will identify positive coping skills.   Patient will identify benefits of using positive coping skills post d/c.  Behavioral Response: Minimal  Intervention: Worksheet, white board, dry erase marker, pencils  Activity: Mind map.  LRT and patients filled in the first 8 boxes together on the mind map with anger, speech, lack of focus, depression, anxiety, self harm thoughts, finances, bi-polar/medical diagnosis.  Patients were to them come up with 3 coping skills individually for each situation.  The group would come back together and LRT would write the coping skills on the board.  Education: PharmacologistCoping Skills, Building control surveyorDischarge Planning.   Education Outcome: Acknowledges understanding/In group clarification offered/Needs additional education.   Clinical Observations/Feedback: Pt came late to group but was able to catch up.  Pt was quiet and attentive.    Caroll RancherMarjette Tahiri Shareef, LRT/CTRS     Caroll RancherLindsay, Meagan Ancona A 05/17/2018 11:33 AM

## 2018-05-17 NOTE — [facility] Group Notes (Signed)
Adult Psychoeducational Group Note  Date:  05/17/2018 Time:  9:10 AM  Group Topic/Focus:  Orientation:   The focus of this group is to educate the patient on the purpose and policies of crisis stabilization and provide a format to answer questions about their admission.  The group details unit policies and expectations of patients while admitted.  Participation Level:  Active  Participation Quality:  Sharing  Affect:  Flat  Cognitive:  Disorganized  Insight: Lacking  Engagement in Group:  Lacking  Modes of Intervention:  Clarification  Additional Comments:  Pt. Wants to work toward discharge.  Donell BeersRodney S Dillyn [name] 05/17/2018, 9:10 AM

## 2018-05-17 NOTE — Discharge Summary (Addendum)
Physician Discharge Summary Note  Patient:  [name] is an 32 y.o., female MRN:  [id number] DOB:  04-13-1986 Patient phone:  [phone] (home)  Patient address:   [address] [place] [address],  Total Time spent with patient: 20 minutes  Date of Admission:  05/11/2018 Date of Discharge: 05/17/2018  Reason for Admission:  Per assessment note-[name] is a 32 y/o F with history of schizophrenia (elsewhere schizoaffective disorder bipolar type) who was admitted [name] on IVC initiated in ED after she contacted emergency services with concern that her mother had a myocardial infarction and had died; however, when emergency services arrived they found pt's mother to be fine and pt was paranoid, agitated, and disorganized. Pt was transported to ED, and she remained paranoid and disorganized. Pt has reportedly not been following up with outpatient provider at [name]. She has recent admission to [name] with discharge on 11/10/17. She was medically cleared and then transferred to [name] for additional treatment and stabilization.Upon initial interview pt shares, "There was just a little glitch weith me and my mom. Something didn't feel right at home. I had a weird feeling. I was watching TV and I fell asleep on the couch. When I woke up I felt like a presence. That's when I called 911." Pt reports feeling overwhelmed with concern that her mother had died, but she now no longer feels worried about that. She endorses some paranoia prior to this event, sharing that others have been rearranging objects in the home. She denies SI/HI/AH/VH. She denies symptoms of mania, OCD, PTSD, and depression. She is sleeping well. Her appetite is good. She denies recent illicit substance use. Discussed with patient about treatment options. She confirms that she has not been taking any medications in recent months. She most recently was discharged from [name] on haldol decanoate. She reports she had some weight gain with  that medication, but she is hesitant to try something different. She agrees to be resumed on haldol oral form, and we will again assess for tolerability. Pt was in agreement with the above plan, and she had no further questions, comments, or concerns.   Principal Problem: Schizoaffective disorder, bipolar type [name]) Discharge Diagnoses: Patient Active Problem List   Diagnosis Date Noted  . Schizophrenia, paranoid type (HCC) [F20.0] 07/14/2017  . Somatic dysfunction of pelvic region [M99.05] 10/26/2014  . Paranoia (HCC) [F22]   . Schizoaffective disorder, bipolar type (HCC) [F25.0] 03/11/2012    Past Psychiatric History: -Previous dx of schizoaffective disorder bipolar type - Pt estimates more than 5 admissions in the past, but cannot recall when last occurred. Last admit to [name] in March 2019. - She has no current outpatient provider but she has worked with [name] in the past  Past Medical History:  Past Medical History:  Diagnosis Date  . Bipolar 1 disorder (HCC) 2011  . Paranoia (HCC)   . Schizoaffective disorder (HCC)    History reviewed. No pertinent surgical history. Family History:  Family History  Problem Relation Age of Onset  . Cancer Father        lymphoma  . Hypertension Mother   . Diabetes Neg Hx   . Heart disease Neg Hx    Family Psychiatric  History:  Social History:  Social History   Substance and Sexual Activity  Alcohol Use No   Comment: Pt denies      Social History   Substance and Sexual Activity  Drug Use No   Comment: Pt denies  Social History   Socioeconomic History  . Marital status: Single    Spouse name: Not on file  . Number of children: 0  . Years of education: some colle  . Highest education level: Not on file  Occupational History  . Occupation: [name]     Comment: trying to start   Social Needs  . Financial resource strain: Not on file  . Food insecurity:    Worry: Not on file    Inability: Not on file  . Transportation  needs:    Medical: Not on file    Non-medical: Not on file  Tobacco Use  . Smoking status: Never Smoker  . Smokeless tobacco: Never Used  Substance and Sexual Activity  . Alcohol use: No    Comment: Pt denies   . Drug use: No    Comment: Pt denies  . Sexual activity: Not Currently    Birth control/protection: Abstinence, None  Lifestyle  . Physical activity:    Days per week: Not on file    Minutes per session: Not on file  . Stress: Not on file  Relationships  . Social connections:    Talks on phone: Not on file    Gets together: Not on file    Attends religious service: Not on file    Active member of club or organization: Not on file    Attends meetings of clubs or organizations: Not on file    Relationship status: Not on file  Other Topics Concern  . Not on file  Social History Narrative   Work or School: not working   Home Situation: lives with mother   Spiritual Beliefs: Christian   Lifestyle: no regular exercise; healthy diet          [name] Course:  [name] was admitted for Schizoaffective disorder, bipolar type (HCC) and crisis management.  Pt was treated discharged with the medications listed below under Medication List.  Medical problems were identified and treated as needed.  Home medications were restarted as appropriate.  Improvement was monitored by observation and [name] 's daily report of symptom reduction.  Emotional and mental status was monitored by daily self-inventory reports completed by [name] and clinical staff.         [name] was evaluated by the treatment team for stability and plans for continued recovery upon discharge. [name] 's motivation was an integral factor for scheduling further treatment. Employment, transportation, bed availability, health status, family support, and any pending legal issues were also considered during [name] stay. Pt was offered further treatment options upon discharge including but not  limited to Residential, Intensive Outpatient, and Outpatient treatment.  Dayami P Gautier will follow up with the services as listed below under Follow Up Information.     Upon completion of this admission the patient was both mentally and medically stable for discharge denying suicidal/homicidal ideation, auditory/visual/tactile hallucinations, delusional thoughts and paranoia.    Ekaterini P Finnicum responded well to treatment with Haldol without adverse effects. Pt demonstrated improvement without reported or observed adverse effects to the point of stability appropriate for outpatient management. Pertinent labs include: Cogentin 1 mg, Haldol 5 mg and Trazodone 50 mg   for which outpatient follow-up is necessary for lab recheck as mentioned below. Reviewed CBC, CMP, BAL, and UDS; all unremarkable aside from noted exceptions.   Physical Findings: AIMS: Facial and Oral Movements Muscles of Facial Expression: None, normal Lips and Perioral Area: None,  normal Jaw: None, normal Tongue: None, normal,Extremity Movements Upper (arms, wrists, hands, fingers): None, normal Lower (legs, knees, ankles, toes): None, normal, Trunk Movements Neck, shoulders, hips: None, normal, Overall Severity Severity of abnormal movements (highest score from questions above): None, normal Incapacitation due to abnormal movements: None, normal Patient's awareness of abnormal movements (rate only patient's report): No Awareness, Dental Status Current problems with teeth and/or dentures?: No Does patient usually wear dentures?: No  CIWA:    COWS:     Musculoskeletal: Strength & Muscle Tone: within normal limits Gait & Station: normal Patient leans: N/A  Psychiatric Specialty Exam: See SRA by MD  Physical Exam  Vitals reviewed. Constitutional: She is oriented to person, place, and time. She appears well-developed.  Cardiovascular: Normal rate.  Neurological: She is alert and oriented to person, place, and time.   Psychiatric: She has a normal mood and affect. Her behavior is normal.    Review of Systems  Psychiatric/Behavioral: Negative for depression and suicidal ideas. Hallucinations: improving  The patient is not nervous/anxious.   All other systems reviewed and are negative.   Blood pressure 93/70, pulse 79, temperature 97.8 F (36.6 C), temperature source Oral, resp. rate 18, height 5\' 4"  (1.626 m), weight 58.1 kg.Body mass index is 21.97 kg/m.    Have you used any form of tobacco in the last 30 days? (Cigarettes, Smokeless Tobacco, Cigars, and/or Pipes): No  Has this patient used any form of tobacco in the last 30 days? (Cigarettes, Smokeless Tobacco, Cigars, and/or Pipes)  No  Blood Alcohol level:  Lab Results  Component Value Date   ETH <10 05/11/2018   ETH <10 10/31/2017    Metabolic Disorder Labs:  Lab Results  Component Value Date   HGBA1C 5.5 11/02/2017   MPG 111.15 11/02/2017   No results found for: PROLACTIN Lab Results  Component Value Date   CHOL 179 07/27/2014   TRIG 49 07/27/2014   HDL 44 07/27/2014   CHOLHDL 5 06/21/2014   VLDL 10 07/27/2014   LDLCALC 125 (H) 07/27/2014   LDLCALC 100 (H) 06/21/2014    See Psychiatric Specialty Exam and Suicide Risk Assessment completed by Attending Physician prior to discharge.  Discharge destination:  Home  Is patient on multiple antipsychotic therapies at discharge:  No   Has Patient had three or more failed trials of antipsychotic monotherapy by history:  No  Recommended Plan for Multiple Antipsychotic Therapies: NA  Discharge Instructions    Diet - low sodium heart healthy   Complete by:  As directed    Discharge instructions   Complete by:  As directed    Take all medications as prescribed. Keep all follow-up appointments as scheduled.  Do not consume alcohol or use illegal drugs while on prescription medications. Report any adverse effects from your medications to your primary care provider promptly.  In the  event of recurrent symptoms or worsening symptoms, call 911, a crisis hotline, or go to the nearest emergency department for evaluation.   Increase activity slowly   Complete by:  As directed      Allergies as of 05/17/2018      Reactions   Quetiapine Other (See Comments)   Facial skin irritation   Yes For Women [intimacy Products] Rash   Zyprexa [olanzapine] Rash      Medication List    STOP taking these medications   haloperidol decanoate 100 MG/ML injection Commonly known as:  HALDOL DECANOATE   OVER THE COUNTER MEDICATION     TAKE  these medications     Indication  benztropine 1 MG tablet Commonly known as:  COGENTIN Take 1 tablet (1 mg total) by mouth 2 (two) times daily. What changed:    when to take this  additional instructions  Indication:  Extrapyramidal Reaction caused by Medications   haloperidol 5 MG tablet Commonly known as:  HALDOL Take 1 tablet (5 mg total) by mouth 2 (two) times daily. What changed:    medication strength  how much to take  when to take this  additional instructions  Indication:  mood stablizaion   hydrOXYzine 50 MG tablet Commonly known as:  ATARAX/VISTARIL Take 1 tablet (50 mg total) by mouth every 6 (six) hours as needed for anxiety. What changed:    medication strength  how much to take  when to take this  Indication:  Feeling Anxious   traZODone 50 MG tablet Commonly known as:  DESYREL Take 1 tablet (50 mg total) by mouth at bedtime as needed and may repeat dose one time if needed for sleep. What changed:  when to take this  Indication:  Anxiety Disorder      Follow-up Information    [facility] Follow up on 05/20/2018.   Why:  Thursday at 8:15 for your [name] follow up appointment Contact information: [address] [place] [address] [phone]           Follow-up recommendations:  Activity:  as tolerated Diet:  heart healthy  Comments:  Take all medications as prescribed. Keep all follow-up  appointments as scheduled.  Do not consume alcohol or use illegal drugs while on prescription medications. Report any adverse effects from your medications to your primary care provider promptly.  In the event of recurrent symptoms or worsening symptoms, call 911, a crisis hotline, or go to the nearest emergency department for evaluation.   Signed: Oneta Rackanika N Lewis, NP 05/17/2018, 9:24 AM   Patient seen, Suicide Assessment Completed.  Disposition Plan Reviewed

## 2018-05-17 NOTE — [facility] Suicide Risk Assessment (Signed)
[facility]   Principal Problem: Schizoaffective disorder, bipolar type [facility](HCC) Discharge Diagnoses:  Patient Active Problem List   Diagnosis Date Noted  . Schizophrenia, paranoid type (HCC) [F20.0] 07/14/2017  . Somatic dysfunction of pelvic region [M99.05] 10/26/2014  . Paranoia (HCC) [F22]   . Schizoaffective disorder, bipolar type (HCC) [F25.0] 03/11/2012    Total Time spent with patient: 30 minutes  Musculoskeletal: Strength & Muscle Tone: within normal limits Gait & Station: normal Patient leans: N/A  Psychiatric Specialty Exam: Review of Systems  Constitutional: Negative for chills and fever.  Respiratory: Negative for cough and shortness of breath.   Cardiovascular: Negative for chest pain.  Gastrointestinal: Negative for abdominal pain, heartburn, nausea and vomiting.  Psychiatric/Behavioral: Negative for depression, hallucinations and suicidal ideas. The patient is not nervous/anxious and does not have insomnia.     Blood pressure 93/70, pulse 79, temperature 97.8 F (36.6 C), temperature source Oral, resp. rate 18, height 5\' 4"  (1.626 m), weight 58.1 kg.Body mass index is 21.97 kg/m.  General Appearance: Casual and Fairly Groomed  Patent attorneyye Contact::  Good  Speech:  Clear and Coherent and Normal Rate  Volume:  Normal  Mood:  Euthymic  Affect:  Appropriate, Congruent and Flat  Thought Process:  Coherent and Goal Directed  Orientation:  Full (Time, Place, and Person)  Thought Content:  Logical  Suicidal Thoughts:  No  Homicidal Thoughts:  No  Memory:  Immediate;   Good Recent;   Good Remote;   Good  Judgement:  Fair  Insight:  Fair  Psychomotor Activity:  Normal  Concentration:  Fair  Recall:  Fair  Fund of Knowledge:Fair  Language: Fair  Akathisia:  No  Handed:    AIMS (if indicated):     Assets:  Resilience Social Support  Sleep:  Number of Hours: 5.75  Cognition: WNL  ADL's:  Intact   Mental Status Per Nursing Assessment::    On Admission:  NA  Demographic Factors:  Low socioeconomic status and Unemployed  Loss Factors: Financial problems/change in socioeconomic status  Historical Factors: Impulsivity  Risk Reduction Factors:   Positive social support, Positive therapeutic relationship and Positive coping skills or problem solving skills  Continued Clinical Symptoms:  Severe Anxiety and/or Agitation Bipolar Disorder:   Mixed State Schizophrenia:   Paranoid or undifferentiated type  Cognitive Features That Contribute To Risk:  None    Suicide Risk:  Minimal: No identifiable suicidal ideation.  Patients presenting with no risk factors but with morbid ruminations; may be classified as minimal risk based on the severity of the depressive symptoms  Follow-up Information    [facility] Follow up on 05/20/2018.   Why:  Thursday at 8:15 for your hospital follow up appointment Contact information: [address] [place] [address] [phone]         Subjective Data: [name] is a 32 y/o F with history of schizophrenia (elsewhere schizoaffective disorder bipolar type) who was admitted [facility] on IVC initiated in ED after she contacted emergency services with concern that her mother had a myocardial infarction and had died; however, when emergency services arrived they found pt's mother to be fine and pt was paranoid, agitated, and disorganized. Pt was transported to ED, and she remained paranoid and disorganized. Pt has reportedly [facility] been [facility]. [facility]. She was medically cleared and then [facility] treatment and stabilization. She was started on trial  of her previous medication of haldol.  Today upon evaluation, pt shares, "I'm great." She denies any specific concerns today. She is sleeping well. Her appetite is good. She denies physical complaints. She denies SI/HI/AH/VH. She denies  symptoms of paranoia. She is tolerating her medications well, and she has not had any problems after receiving the decanoate injection of haldol. She is in agreement to continue her current regimen including to continue the oral form of her medications until directed by her outpatient provider to taper off and only stay on the haldol decanoate. She was able to engage in safety planning including plan to return to [facility] or contact emergency services if she feels unable to maintain her own safety or the safety of others. Pt had no further questions, comments, or concerns.   Plan Of Care/Follow-up recommendations:   -Discharge to outpatient level of care  -Schizoaffective disorder, bipolar type              -Continue haldol 5mg  po BID             -Continue haldol decanoate 100mg  IM q30 days (started on 05/13/18)  -Anxiety             -Continue vistaril 50mg  po q6h prn anxiety  -Insomnia             -Continue trazodone 50mg  po qhs prn insomnia  -EPS             -Continue cogentin 1mg  po BID  Activity:  as tolerated Diet:  normal Tests:  NA Other:  see above for DC plan  Micheal Likens, MD 05/17/2018, 9:54 AM

## 2018-06-17 ENCOUNTER — Inpatient Hospital Stay: Payer: Self-pay | Admitting: Family Medicine

## 2018-11-27 ENCOUNTER — Emergency Department (HOSPITAL_COMMUNITY)
Admission: EM | Admit: 2018-11-27 | Discharge: 2018-11-29 | Disposition: A | Discharge status: Other Acute Inpatient Hospital | Payer: Medicaid Other | Attending: Emergency Medicine | Admitting: Emergency Medicine

## 2018-11-27 ENCOUNTER — Other Ambulatory Visit: Payer: Self-pay

## 2018-11-27 DIAGNOSIS — F250 Schizoaffective disorder, bipolar type: Secondary | ICD-10-CM | POA: Insufficient documentation

## 2018-11-27 DIAGNOSIS — F200 Paranoid schizophrenia: Secondary | ICD-10-CM

## 2018-11-27 DIAGNOSIS — Z79899 Other long term (current) drug therapy: Secondary | ICD-10-CM | POA: Insufficient documentation

## 2018-11-27 DIAGNOSIS — F22 Delusional disorders: Secondary | ICD-10-CM

## 2018-11-27 LAB — BASIC METABOLIC PANEL
Anion gap: 9 (ref 5–15)
BUN: 14 mg/dL (ref 6–20)
CO2: 27 mmol/L (ref 22–32)
Calcium: 9.2 mg/dL (ref 8.9–10.3)
Chloride: 100 mmol/L (ref 98–111)
Creatinine, Ser: 0.85 mg/dL (ref 0.44–1.00)
GFR calc Af Amer: >60 mL/min (ref >60)
GFR calc non Af Amer: >60 mL/min (ref >60)
Glucose, Bld: 96 mg/dL (ref 70–99)
Potassium: 3.5 mmol/L (ref 3.5–5.1)
Sodium: 136 mmol/L (ref 135–145)

## 2018-11-27 LAB — CBC WITH DIFFERENTIAL/PLATELET
Abs Immature Granulocytes: 0 10*3/uL (ref 0.00–0.07)
Basophils Absolute: 0.1 10*3/uL (ref 0.0–0.1)
Basophils Relative: 1 %
Eosinophils Absolute: 0.1 10*3/uL (ref 0.0–0.5)
Eosinophils Relative: 1 %
HCT: 39.4 % (ref 36.0–46.0)
Hemoglobin: 12.9 g/dL (ref 12.0–15.0)
Immature Granulocytes: 0 %
Lymphocytes Relative: 42 %
Lymphs Abs: 1.8 10*3/uL (ref 0.7–4.0)
MCH: 27 pg (ref 26.0–34.0)
MCHC: 32.7 g/dL (ref 30.0–36.0)
MCV: 82.6 fL (ref 80.0–100.0)
Monocytes Absolute: 0.3 10*3/uL (ref 0.1–1.0)
Monocytes Relative: 8 %
Neutro Abs: 2 10*3/uL (ref 1.7–7.7)
Neutrophils Relative %: 48 %
Platelets: 288 10*3/uL (ref 150–400)
RBC: 4.77 MIL/uL (ref 3.87–5.11)
RDW: 14.6 % (ref 11.5–15.5)
WBC: 4.2 10*3/uL (ref 4.0–10.5)
nRBC: 0 % (ref 0.0–0.2)

## 2018-11-27 LAB — I-STAT BETA HCG BLOOD, ED (MC, WL, AP ONLY): I-stat hCG, quantitative: <5 m[IU]/mL (ref <5)

## 2018-11-27 LAB — ETHANOL: Alcohol, Ethyl (B): <10 mg/dL (ref <10)

## 2018-11-27 MED ORDER — TRAZODONE HCL 50 MG PO TABS
50.0000 mg | ORAL_TABLET | Freq: Every evening | ORAL | Status: DC | PRN
  Filled 2018-11-27 (×2): qty 1

## 2018-11-27 MED ORDER — HALOPERIDOL 5 MG PO TABS
5.0000 mg | ORAL_TABLET | Freq: Two times a day (BID) | ORAL | Status: DC
  Administered 2018-11-27 – 2018-11-29 (×3): 5 mg via ORAL
  Filled 2018-11-27 (×5): qty 1

## 2018-11-27 MED ORDER — BENZTROPINE MESYLATE 1 MG PO TABS
1.0000 mg | ORAL_TABLET | Freq: Two times a day (BID) | ORAL | Status: DC
  Administered 2018-11-27 – 2018-11-29 (×3): 1 mg via ORAL
  Filled 2018-11-27 (×5): qty 1

## 2018-11-27 MED ORDER — HYDROXYZINE HCL 25 MG PO TABS
50.0000 mg | ORAL_TABLET | Freq: Four times a day (QID) | ORAL | Status: DC | PRN
  Administered 2018-11-27: 13:00:00 50 mg via ORAL
  Filled 2018-11-27: qty 2

## 2018-11-27 NOTE — ED Notes (Signed)
It was difficult to get pt changed into paper scrubs to come to the Acute Unit. Thoughts are disorganized. Screams out frequently. Currently in bed hugging pillow. She was wanded by security prior to admission to the acute unit.  BELONGINGS IN LOCKER 93. INCLUDE DRESS, SHOES, SWEATER, HEAD SCARF WRAP, WALLET WITH VARIOUS CARDS.

## 2018-11-27 NOTE — ED Notes (Signed)
Calm, talking to [facility] officer.  Per officer mom has petitioned for IVC and the papers are in process

## 2018-11-27 NOTE — ED Notes (Signed)
Patient refuses to change into scrubs. RN aware

## 2018-11-27 NOTE — ED Notes (Signed)
Pt ambulatory w/ gpd--per gpd pt has hx of bipolar and has been off her meds x6 months. Called by pt's mother who was conserned about her safety.

## 2018-11-27 NOTE — ED Notes (Addendum)
Pt agreed to change into scrubs. Pt does still have her personal underwear and bra on- next RN is aware. Pt belongings consisting of a dress, scarf, boots, jacket, brown wallet, a cup with lid, a single grapefruit, and a large binder. All items checked by security and placed in locker 39.

## 2018-11-27 NOTE — ED Notes (Addendum)
Blood drawn, pt tolerated well, remained talkative, whispering to herself at times

## 2018-11-27 NOTE — ED Notes (Signed)
Pt declines th change into scrubs, secuty into wand

## 2018-11-27 NOTE — ED Notes (Signed)
TTS into see 

## 2018-11-27 NOTE — ED Notes (Signed)
[employer] informed this Clinical research associate that patient needed "feminie supplies" Patient provided with mesh underwear and sanitary pads.

## 2018-11-27 NOTE — ED Notes (Signed)
Medicated w/ minimal difficulty, talkative, disorganized

## 2018-11-27 NOTE — ED Notes (Signed)
Dr Anitra Lauth into see, pt  Talkative, rambling, disorganized.

## 2018-11-27 NOTE — ED Provider Notes (Signed)
[facility]-EMERGENCY DEPT Provider Note   CSN: [id number] Arrival date & time: 11/27/18  1133    History   Chief Complaint No chief complaint on file.   HPI [name] is a 33 y.o. female.     Patient is a 33 year old female with a history of paranoid schizophrenia and bipolar disease who is presenting today with police due to erratic behavior.  Patient lives with her mother and mother states that she has been off medications for approximately 6 months and today she just left the house.  Mom is concerned that she will get hurt by walking into traffic.  Patient here is angry and talking repetitively but difficult to follow.  Mom denied any drug or alcohol use.  Police are familiar with the patient and states that this is fairly common and she will storm often disappear but [place] which makes her safety more of a concern.  Patient denies any abdominal pain chest pain or shortness of breath.  The history is provided by the patient and the police.    Past Medical History:  Diagnosis Date   Bipolar 1 disorder (HCC) 2011   Paranoia (HCC)    Schizoaffective disorder [facility])     Patient Active Problem List   Diagnosis Date Noted   Schizophrenia, paranoid type (HCC) 07/14/2017   Somatic dysfunction of pelvic region 10/26/2014   Paranoia (HCC)    Schizoaffective disorder, bipolar type (HCC) 03/11/2012    No past surgical history on file.   OB History   No obstetric history on file.      Home Medications    Prior to Admission medications   Medication Sig Start Date End Date Taking? Authorizing Provider  benztropine (COGENTIN) 1 MG tablet Take 1 tablet (1 mg total) by mouth 2 (two) times daily. 05/17/18   Oneta Rack, NP  haloperidol (HALDOL) 5 MG tablet Take 1 tablet (5 mg total) by mouth 2 (two) times daily. 05/17/18   Oneta Rack, NP  hydrOXYzine (ATARAX/VISTARIL) 50 MG tablet Take 1 tablet (50 mg total) by mouth  every 6 (six) hours as needed for anxiety. 05/17/18   Oneta Rack, NP  traZODone (DESYREL) 50 MG tablet Take 1 tablet (50 mg total) by mouth at bedtime as needed and may repeat dose one time if needed for sleep. 05/17/18   Oneta Rack, NP    Family History Family History  Problem Relation Age of Onset   Cancer Father        lymphoma   Hypertension Mother    Diabetes Neg Hx    Heart disease Neg Hx     Social History Social History   Tobacco Use   Smoking status: Never Smoker   Smokeless tobacco: Never Used  Substance Use Topics   Alcohol use: No    Comment: Pt denies    Drug use: No    Comment: Pt denies     Allergies   Quetiapine; Yes for women [intimacy products]; and Zyprexa [olanzapine]   Review of Systems Review of Systems  Unable to perform ROS: Psychiatric disorder (Patient is not willing to cooperate)     Physical Exam Updated Vital Signs BP 130/67 (BP Location: Left Arm)    Pulse (!) 101    Temp 98.7 F (37.1 C) (Oral)    Resp 16    Wt 60.3 kg    SpO2 100%    BMI 22.83 kg/m   Physical  Exam HENT:     Head: Normocephalic.     Nose: Nose normal.  Pulmonary:     Effort: Pulmonary effort is normal.  Neurological:     General: No focal deficit present.     Mental Status: She is alert. Mental status is at baseline.  Psychiatric:        Mood and Affect: Affect is labile and angry.        Speech: Speech is tangential.        Behavior: Behavior is agitated and aggressive.        Thought Content: Thought content is paranoid.     Comments: Patient is talking in circles and not making any sense.  She continually talks about who can she trust.  She then starts talking about being seduced and having a child but is unclear if this is happened to her or someone she knows.  She is not answer any questions straightforward.  She states people are going to get her and she cannot trust anyone.      ED Treatments / Results  Labs (all labs ordered are  listed, but only abnormal results are displayed) Labs Reviewed  CBC WITH DIFFERENTIAL/PLATELET  BASIC METABOLIC PANEL  ETHANOL  RAPID URINE DRUG SCREEN, HOSP PERFORMED  I-STAT BETA HCG BLOOD, ED ([facility], [facility], [facility] ONLY)    EKG None  Radiology No results found.  Procedures Procedures (including critical care time)  Medications Ordered in ED Medications - No data to display   Initial Impression / Assessment and Plan / ED Course  I have reviewed the triage vital signs and the nursing notes.  Pertinent labs & imaging results that were available during my care of the patient were reviewed by me and considered in my medical decision making (see chart for details).        Patient arriving today in police custody after being called by her mother.  Mother takes care of the patient but states she stormed out today and she was concerned for her safety.  She has been off meds for at least 6 months and today she is labile, angry and agitated.  She is paranoid.  The concern is that she will hurt herself by accidentally walking in front of a car.  Patient currently is refusing all vital signs and further physical exam.  She will need TTS evaluation.  Labs ordered but unclear when those will be drawn.  2:15 PM Blood testing is wnl.  VS are stable.  Pt seen by psych and deemed to be inpt.  Final Clinical Impressions(s) / ED Diagnoses   Final diagnoses:  Paranoia (psychosis) (HCC)  Schizophrenia, unspecified type [facility])    ED Discharge Orders    None       Gwyneth Sprout, MD 11/27/18 1416

## 2018-11-27 NOTE — ED Notes (Signed)
Patient refuses to change into scrubs. RN aware 

## 2018-11-27 NOTE — BH Assessment (Signed)
[facility] Assessment Progress Note Case was staffed with Lord DNP who recommended a inpatient admission to assist with stabilization.       

## 2018-11-27 NOTE — ED Notes (Addendum)
Pt awake, alert & responsive, no distress noted, calm at present.  Sleeping at present.  Monitoring for safety, Q 15 min checks in effect.  Pt is IVC.

## 2018-11-27 NOTE — ED Notes (Signed)
Medicated w/o difficulty, whispering to herself at times.

## 2018-11-27 NOTE — ED Notes (Signed)
Pt refused all night time meds. 

## 2018-11-27 NOTE — ED Notes (Addendum)
Talking w/ [employer], aminated, loud at times, continues to decline to change into scrubs.

## 2018-11-27 NOTE — BH Assessment (Addendum)
Assessment Note  [name] is an 33 y.o. female that presents this date with VH. Patient denies any S/I or H/I. Patient seems to be responding to internal stimuli as evidenced by patient asking this writer "did you see that" as she points to various objects in the room. It is unclear if patient is experiencing AH and will not elaborate on symptoms when prompted. Patient is observed to be disorganized at times and continues to ask "is this the right hospital?" Patient is currently in the process of having a IVC initiated by a family member per note review. Patient speaks in a low soft voice and is difficult to understand at times. Patient is oriented to place only and continues to ruminate on a verbal altercation that occurred earlier this date with her mother with whom she resides. Patient is difficult to redirect and cannot clarify what that altercation was in reference to. Patient states it was "all about identification" although renders limited history in reference to that incident. Patient denies any history of SA use with UDS pending this date. Patient states she has been receiving OP services from [facility] that assists with medication management. Patient cannot recall the last time she met with that provider and it is unclear if patient is current with medication regimen. This writer attempted unsuccessfully to contact patient's mother Telma Pyeatt [phone]) to obtain collateral information. Per notes patient was last seen on 05/11/18 presenting at that time with similar symptoms. Patient per that note (05/11/18), had called 911 in the middle of the night stating that her mother was having a coronary attack. When the police arrived, patient's mother was fine and they determined that the patient was psychotic and brought her to the ED. This date patient's thoughts were noted to be loose and disorganized and she abruptly ended the assessment by saying "that is all that needs to be said." Patient has a  history of Schizoaffective disorder per chart review although denies any symptoms this date. Case was staffed with Reita Cliche DNP who recommended a inpatient admission to assist with stabilization.     Diagnosis: F25.0 Schizoaffective disorder, Bipolar type    Past Medical History:  Past Medical History:  Diagnosis Date  . Bipolar 1 disorder ([place]) 2011  . Paranoia ([place])   . Schizoaffective disorder ([place])     No past surgical history on file.  Family History:  Family History  Problem Relation Age of Onset  . Cancer Father        lymphoma  . Hypertension Mother   . Diabetes Neg Hx   . Heart disease Neg Hx     Social History:  reports that she has never smoked. She has never used smokeless tobacco. She reports that she does not drink alcohol or use drugs.  Additional Social History:  Alcohol / Drug Use Pain Medications: see MAR Prescriptions: see MAR Over the Counter: see MAR History of alcohol / drug use?: No history of alcohol / drug abuse Longest period of sobriety (when/how long): NA Negative Consequences of Use: (NA) Withdrawal Symptoms: (NA)  CIWA: CIWA-Ar BP: 130/67 Pulse Rate: (!) 101 COWS:    Allergies:  Allergies  Allergen Reactions  . Quetiapine Other (See Comments)    Facial skin irritation  . Yes For Women [Intimacy Products] Rash  . Zyprexa [Olanzapine] Rash    Home Medications: (Not in a hospital admission)   OB/GYN Status:  No LMP recorded.  General Assessment Data [facility] of Assessment: [facility] TTS Assessment: In  system Is this a Tele or Face-to-Face Assessment?: Face-to-Face Is this an Initial Assessment or a Re-assessment for this encounter?: Initial Assessment Patient Accompanied by:: N/A Language Other than English: No Living Arrangements: Other (Comment)(Parent) What gender do you identify as?: Female Marital status: Single Maiden name: Porche Pregnancy Status: No Living Arrangements: Parent Can pt return to current living arrangement?:  Yes Admission Status: Involuntary(In progress per report) Petitioner: Family member Is patient capable of signing voluntary admission?: No Referral Source: Self/Family/Friend Insurance type: Medicaid  Medical Screening Exam ([place]) Medical Exam completed: Yes  Crisis Care Plan Living Arrangements: Parent Legal Guardian: (NA) Name of Psychiatrist: [place] Name of Therapist: None  Education Status Is patient currently in school?: No Is the patient employed, unemployed or receiving disability?: Receiving disability income  Risk to self with the past 6 months Suicidal Ideation: No Has patient been a risk to self within the past 6 months prior to admission? : No Suicidal Intent: No Has patient had any suicidal intent within the past 6 months prior to admission? : No Is patient at risk for suicide?: No Suicidal Plan?: No Has patient had any suicidal plan within the past 6 months prior to admission? : No Access to Means: No What has been your use of drugs/alcohol within the last 12 months?: Denies Previous Attempts/Gestures: No How many times?: 0 Other Self Harm Risks: (Medication non compliance) Triggers for Past Attempts: (NA) Intentional Self Injurious Behavior: None Family Suicide History: No Recent stressful life event(s): Other (Comment)(Altercation with family members) Persecutory voices/beliefs?: No Depression: No Depression Symptoms: (Denies ) Substance abuse history and/or treatment for substance abuse?: No Suicide prevention information given to non-admitted patients: Not applicable  Risk to Others within the past 6 months Homicidal Ideation: No Does patient have any lifetime risk of violence toward others beyond the six months prior to admission? : No Thoughts of Harm to Others: No Current Homicidal Intent: No Current Homicidal Plan: No Access to Homicidal Means: No Identified Victim: NA History of harm to others?: No Assessment of Violence: None  Noted Violent Behavior Description: NA Does 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 to weapons?: No Criminal Charges Pending?: No Does patient have a court date: No Is patient on probation?: No  Psychosis Hallucinations: Auditory, Visual Delusions: None noted  Mental Status Report Appearance/Hygiene: Unremarkable Eye Contact: Fair Motor Activity: Freedom of movement Speech: Soft, Slow Level of Consciousness: Quiet/awake Mood: Irritable Affect: Preoccupied Anxiety Level: Moderate Thought Processes: Thought Blocking Judgement: Partial Orientation: Place Obsessive Compulsive Thoughts/Behaviors: None  Cognitive Functioning Concentration: Decreased Memory: Recent Intact, Remote Intact Is patient IDD: No Insight: Fair Impulse Control: Poor Appetite: Good Have you had any weight changes? : No Change Sleep: No Change Total Hours of Sleep: 7 Vegetative Symptoms: None  ADLScreening [facility] Assessment Services) Patient's cognitive ability adequate to safely complete daily activities?: Yes Patient able to express need for assistance with ADLs?: Yes Independently performs ADLs?: Yes (appropriate for developmental age)  Prior Inpatient Therapy Prior Inpatient Therapy: Yes Prior Therapy Dates: 2019 Prior Therapy Facilty/Provider(s): [facility], [facility] Reason for Treatment: MH issues  Prior Outpatient Therapy Prior Outpatient Therapy: Yes Prior Therapy Dates: Ongoing Prior Therapy Facilty/Provider(s): [facility] Reason for Treatment: Med mang Does patient have an ACCT team?: No Does patient have Intensive In-House Services?  : No Does patient have [facility] services? : Yes Does patient have P4CC services?: No  ADL Screening (condition at time of admission) Patient's cognitive ability adequate to safely complete daily activities?: Yes Is the patient deaf or have  difficulty hearing?: No Does the patient have difficulty seeing, even when wearing glasses/contacts?: No Does the patient have difficulty  concentrating, remembering, or making decisions?: Yes Patient able to express need for assistance with ADLs?: Yes Does the patient have difficulty dressing or bathing?: No Independently performs ADLs?: Yes (appropriate for developmental age) Does the patient have difficulty walking or climbing stairs?: No Weakness of Legs: None Weakness of Arms/Hands: None  Home Assistive Devices/Equipment Home Assistive Devices/Equipment: None  Therapy Consults (therapy consults require a physician order) PT Evaluation Needed: No OT Evalulation Needed: No SLP Evaluation Needed: No Abuse/Neglect Assessment (Assessment to be complete while patient is alone) Physical Abuse: Denies Verbal Abuse: Denies Sexual Abuse: Denies Exploitation of patient/patient's resources: Denies Self-Neglect: Denies Values / Beliefs Cultural Requests During Hospitalization: None Spiritual Requests During Hospitalization: None Consults Spiritual Care Consult Needed: No Social Work Consult Needed: No Regulatory affairs officer (For Healthcare) Does Patient Have a Medical Advance Directive?: No Would patient like information on creating a medical advance directive?: No - Patient declined          Disposition: Case was staffed with Reita Cliche DNP who recommended a inpatient admission to assist with stabilization.     Disposition Initial Assessment Completed for this Encounter: Yes Disposition of Patient: Admit Type of inpatient treatment program: Adult Patient refused recommended treatment: No Mode of transportation if patient is discharged/movement?: (Unk)  On Site Evaluation by:   Reviewed with Physician:    Mamie Nick 11/27/2018 1:10 PM

## 2018-11-28 DIAGNOSIS — G4709 Other insomnia: Secondary | ICD-10-CM | POA: Diagnosis not present

## 2018-11-28 DIAGNOSIS — F23 Brief psychotic disorder: Secondary | ICD-10-CM

## 2018-11-28 DIAGNOSIS — F250 Schizoaffective disorder, bipolar type: Secondary | ICD-10-CM | POA: Diagnosis not present

## 2018-11-28 DIAGNOSIS — F918 Other conduct disorders: Secondary | ICD-10-CM | POA: Diagnosis not present

## 2018-11-28 DIAGNOSIS — F419 Anxiety disorder, unspecified: Secondary | ICD-10-CM | POA: Diagnosis not present

## 2018-11-28 DIAGNOSIS — R462 Strange and inexplicable behavior: Secondary | ICD-10-CM

## 2018-11-28 DIAGNOSIS — Z9114 Patient's other noncompliance with medication regimen: Secondary | ICD-10-CM | POA: Diagnosis not present

## 2018-11-28 MED ORDER — DIPHENHYDRAMINE HCL 50 MG/ML IJ SOLN
50.0000 mg | Freq: Once | INTRAMUSCULAR | Status: AC
  Administered 2018-11-28: 13:00:00 50 mg via INTRAMUSCULAR
  Filled 2018-11-28: qty 1

## 2018-11-28 MED ORDER — CARBAMAZEPINE 200 MG PO TABS
200.0000 mg | ORAL_TABLET | Freq: Two times a day (BID) | ORAL | Status: DC
  Administered 2018-11-28 – 2018-11-29 (×3): 200 mg via ORAL
  Filled 2018-11-28 (×3): qty 1

## 2018-11-28 MED ORDER — LORAZEPAM 2 MG/ML IJ SOLN
2.0000 mg | Freq: Once | INTRAMUSCULAR | Status: AC
  Administered 2018-11-28: 13:00:00 2 mg via INTRAMUSCULAR
  Filled 2018-11-28: qty 1

## 2018-11-28 MED ORDER — HALOPERIDOL DECANOATE 100 MG/ML IM SOLN
100.0000 mg | Freq: Once | INTRAMUSCULAR | Status: AC
  Administered 2018-11-28: 13:00:00 100 mg via INTRAMUSCULAR
  Filled 2018-11-28: qty 1

## 2018-11-28 NOTE — ED Notes (Signed)
Pt was given a urine cup and encouraged to provide sample.

## 2018-11-28 NOTE — ED Notes (Signed)
Pt awake, alert & responsive, no distress noted, calm at present, sleeping at present, paranoid and guarded.  Monitoring for safety, Q 15 min checks in effect.

## 2018-11-28 NOTE — ED Notes (Signed)
Pt is cooperative and somewhat calm.  She is paranoid, especially around certain men.  She is having some thought blocking and is difficult to talk to .  She has an excessive amount of body odor but finally took a shower for [place].  She is compliant with her medication today. 15 minute checks and video monitoring in place.

## 2018-11-28 NOTE — ED Notes (Signed)
PT REFUSED ALL NIGHT TIME MEDICATIONS.

## 2018-11-28 NOTE — Consult Note (Signed)
[facility] Face-to-Face Psychiatry Consult   Reason for Consult: psychotic, non-compliant with medication Referring Physician:  EDP Patient Identification: [name] MRN:  [id number] Principal Diagnosis: Schizoaffective disorder, bipolar type (HCC) Diagnosis:  Principal Problem:   Schizoaffective disorder, bipolar type (HCC)   Total Time spent with patient: 45 minutes  Subjective:   [name] is a 33 y.o. female patient admitted due to psychosis and disorganized behavior.  HPI:  Patient with long history of Paranoid schizophrenia and bipolar disorder who was brought to [facility] by [facility] police due to psychosis, erratic behavior, disorganized thought process and agitation. Collateral information from her mother revealed that patient stopped taking her medications about 6 months ago and has decompensated since then. Mother reports that patient has become more labile, easily agitated and has been talking to herself repetitively. Patient is too disorganized to give the sequence of events that lead to her current hospitalization.  Past Psychiatric History: as above  Risk to Self: Suicidal Ideation: No Suicidal Intent: No Is patient at risk for suicide?: No Suicidal Plan?: No Access to Means: No What has been your use of drugs/alcohol within the last 12 months?: Denies How many times?: 0 Other Self Harm Risks: (Medication non compliance) Triggers for Past Attempts: (NA) Intentional Self Injurious Behavior: None Risk to Others: Homicidal Ideation: No Thoughts of Harm to Others: No Current Homicidal Intent: No Current Homicidal Plan: No Access to Homicidal Means: No Identified Victim: NA History of harm to others?: No Assessment of Violence: None Noted Violent Behavior Description: NA Does 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 to weapons?: No Criminal Charges Pending?: No Does patient have a court date: No Prior Inpatient Therapy: Prior Inpatient Therapy: Yes Prior Therapy Dates: 2019 Prior Therapy  Facilty/Provider(s): [facility], [facility] Reason for Treatment: MH issues Prior Outpatient Therapy: Prior Outpatient Therapy: Yes Prior Therapy Dates: Ongoing Prior Therapy Facilty/Provider(s): [facility] Reason for Treatment: Med mang Does patient have an ACCT team?: No Does patient have Intensive In-House Services?  : No Does patient have [facility] services? : Yes Does patient have P4CC services?: No  Past Medical History:  Past Medical History:  Diagnosis Date  . Bipolar 1 disorder (HCC) 2011  . Paranoia (HCC)   . Schizoaffective disorder (HCC)    No past surgical history on file. Family History:  Family History  Problem Relation Age of Onset  . Cancer Father        lymphoma  . Hypertension Mother   . Diabetes Neg Hx   . Heart disease Neg Hx    Family Psychiatric  History:  Social History:  Social History   Substance and Sexual Activity  Alcohol Use No   Comment: Pt denies      Social History   Substance and Sexual Activity  Drug Use No   Comment: Pt denies    Social History   Socioeconomic History  . Marital status: Single    Spouse name: Not on file  . Number of children: 0  . Years of education: some colle  . Highest education level: Not on file  Occupational History  . Occupation: [name]     Comment: trying to start   Social Needs  . Financial resource strain: Not on file  . Food insecurity:    Worry: Not on file    Inability: Not on file  . Transportation needs:    Medical: Not on file    Non-medical: Not on file  Tobacco Use  . Smoking status: Never Smoker  . Smokeless tobacco:  Never Used  Substance and Sexual Activity  . Alcohol use: No    Comment: Pt denies   . Drug use: No    Comment: Pt denies  . Sexual activity: Not Currently    Birth control/protection: Abstinence, None  Lifestyle  . Physical activity:    Days per week: Not on file    Minutes per session: Not on file  . Stress: Not on file  Relationships  . Social connections:    Talks on  phone: Not on file    Gets together: Not on file    Attends religious service: Not on file    Active member of club or organization: Not on file    Attends meetings of clubs or organizations: Not on file    Relationship status: Not on file  Other Topics Concern  . Not on file  Social History Narrative   Work or School: not working   Home Situation: lives with mother   Spiritual Beliefs: Christian   Lifestyle: no regular exercise; healthy diet         Additional Social History:    Allergies:   Allergies  Allergen Reactions  . Quetiapine Other (See Comments)    Facial skin irritation  . Yes For Women [Intimacy Products] Rash  . Zyprexa [Olanzapine] Rash    Labs:  Results for orders placed or performed during the hospital encounter of 11/27/18 (from the past 48 hour(s))  CBC with Differential/Platelet     Status: None   Collection Time: 11/27/18 11:51 AM  Result Value Ref Range   WBC 4.2 4.0 - 10.5 K/uL   RBC 4.77 3.87 - 5.11 MIL/uL   Hemoglobin 12.9 12.0 - 15.0 g/dL   HCT 16.1 09.6 - 04.5 %   MCV 82.6 80.0 - 100.0 fL   MCH 27.0 26.0 - 34.0 pg   MCHC 32.7 30.0 - 36.0 g/dL   RDW 40.9 81.1 - 91.4 %   Platelets 288 150 - 400 K/uL   nRBC 0.0 0.0 - 0.2 %   Neutrophils Relative % 48 %   Neutro Abs 2.0 1.7 - 7.7 K/uL   Lymphocytes Relative 42 %   Lymphs Abs 1.8 0.7 - 4.0 K/uL   Monocytes Relative 8 %   Monocytes Absolute 0.3 0.1 - 1.0 K/uL   Eosinophils Relative 1 %   Eosinophils Absolute 0.1 0.0 - 0.5 K/uL   Basophils Relative 1 %   Basophils Absolute 0.1 0.0 - 0.1 K/uL   Immature Granulocytes 0 %   Abs Immature Granulocytes 0.00 0.00 - 0.07 K/uL    Comment: Performed at [facility], [address], [place], [place] [address]  Basic metabolic panel     Status: None   Collection Time: 11/27/18 11:51 AM  Result Value Ref Range   Sodium 136 135 - 145 mmol/L   Potassium 3.5 3.5 - 5.1 mmol/L   Chloride 100 98 - 111 mmol/L   CO2 27 22 - 32 mmol/L    Glucose, Bld 96 70 - 99 mg/dL   BUN 14 6 - 20 mg/dL   Creatinine, Ser 6.21 0.44 - 1.00 mg/dL   Calcium 9.2 8.9 - 30.8 mg/dL   GFR calc non Af Amer >60 >60 mL/min   GFR calc Af Amer >60 >60 mL/min   Anion gap 9 5 - 15    Comment: Performed at [facility], [address], [place], [place] [address]  Ethanol     Status: None  Collection Time: 11/27/18 11:51 AM  Result Value Ref Range   Alcohol, Ethyl (B) <10 <10 mg/dL    Comment: (NOTE) Lowest detectable limit for serum alcohol is 10 mg/dL. For medical purposes only. Performed at [facility], [address] [place], [place] [address]   I-Stat beta hCG blood, ED     Status: None   Collection Time: 11/27/18 12:29 PM  Result Value Ref Range   I-stat hCG, quantitative <5.0 <5 mIU/mL   Comment 3            Comment:   GEST. AGE      CONC.  (mIU/mL)   <=1 WEEK        5 - 50     2 WEEKS       50 - 500     3 WEEKS       100 - 10,000     4 WEEKS     1,000 - 30,000        FEMALE AND NON-PREGNANT FEMALE:     LESS THAN 5 mIU/mL     Current Facility-Administered Medications  Medication Dose Route Frequency Provider Last Rate Last Dose  . benztropine (COGENTIN) tablet 1 mg  1 mg Oral BID Charm Rings, NP   1 mg at 11/28/18 1013  . carbamazepine (TEGRETOL) tablet 200 mg  200 mg Oral BID PC Jazari Ober, MD      . diphenhydrAMINE (BENADRYL) injection 50 mg  50 mg Intramuscular Once Charm Rings, NP      . haloperidol (HALDOL) tablet 5 mg  5 mg Oral BID Charm Rings, NP   5 mg at 11/28/18 1013  . haloperidol decanoate (HALDOL DECANOATE) 100 MG/ML injection 100 mg  100 mg Intramuscular Once Charm Rings, NP      . hydrOXYzine (ATARAX/VISTARIL) tablet 50 mg  50 mg Oral Q6H PRN Charm Rings, NP   50 mg at 11/27/18 1323  . LORazepam (ATIVAN) injection 2 mg  2 mg Intramuscular Once Charm Rings, NP      . traZODone (DESYREL) tablet 50 mg  50 mg Oral QHS PRN,MR X 1 Lord, Herminio Heads, NP        Current Outpatient Medications  Medication Sig Dispense Refill  . benztropine (COGENTIN) 1 MG tablet Take 1 tablet (1 mg total) by mouth 2 (two) times daily. 60 tablet 0  . haloperidol (HALDOL) 5 MG tablet Take 1 tablet (5 mg total) by mouth 2 (two) times daily. 60 tablet 0  . hydrOXYzine (ATARAX/VISTARIL) 50 MG tablet Take 1 tablet (50 mg total) by mouth every 6 (six) hours as needed for anxiety. 30 tablet 0  . traZODone (DESYREL) 50 MG tablet Take 1 tablet (50 mg total) by mouth at bedtime as needed and may repeat dose one time if needed for sleep. 30 tablet 0    Musculoskeletal: Strength & Muscle Tone: within normal limits Gait & Station: normal Patient leans: N/A  Psychiatric Specialty Exam: Physical Exam  Psychiatric: Thought content normal. Her affect is labile. Her speech is rapid and/or pressured. She is agitated, aggressive and actively hallucinating. Cognition and memory are normal. She expresses impulsivity.    Review of Systems  Constitutional: Negative.   HENT: Negative.   Eyes: Negative.   Respiratory: Negative.   Cardiovascular: Negative.   Gastrointestinal: Negative.   Genitourinary: Negative.   Musculoskeletal: Negative.   Skin: Negative.   Neurological: Negative.   Endo/Heme/Allergies: Negative.   Psychiatric/Behavioral: Positive  for hallucinations.    Blood pressure 111/61, pulse (!) 56, temperature 98.8 F (37.1 C), temperature source Oral, resp. rate 18, weight 60.3 kg, SpO2 100 %.Body mass index is 22.83 kg/m.  General Appearance: Casual  Eye Contact:  Fair  Speech:  Clear and Coherent  Volume:  Increased  Mood:  Irritable  Affect:  Labile  Thought Process:  Disorganized  Orientation:  Full (Time, Place, and Person)  Thought Content:  Illogical and Hallucinations: Auditory  Suicidal Thoughts:  No  Homicidal Thoughts:  No  Memory:  Immediate;   Fair Recent;   Fair Remote;   Fair  Judgement:  Impaired  Insight:  Lacking  Psychomotor Activity:   Restlessness  Concentration:  Concentration: Fair and Attention Span: Fair  Recall:  [employer] of Knowledge:  Fair  Language:  Good  Akathisia:  No  Handed:  Right  AIMS (if indicated):     Assets:  Communication Skills Social Support  ADL's:  Intact  Cognition:  WNL  Sleep:        Treatment Plan Summary: Daily contact with patient to assess and evaluate symptoms and progress in treatment and Medication management  -Haldol 5 mg bid for psychosis -Carbamazepine 200 mg bid for bipolar -Cogentin  bid for EPS prevention -Consider Haldol decanoate due to patient being non-compliant with oral Haldol.  Disposition: Recommend psychiatric Inpatient admission when medically cleared.  Thedore Mins, MD 11/28/2018 10:32 AM

## 2018-11-29 ENCOUNTER — Encounter (HOSPITAL_COMMUNITY): Payer: Self-pay

## 2018-11-29 ENCOUNTER — Inpatient Hospital Stay (HOSPITAL_COMMUNITY)
Admission: AD | Admit: 2018-11-29 | Discharge: 2018-12-03 | DRG: 885 | Disposition: A | Discharge status: Home / Self Care | Payer: Medicaid Other | Attending: Psychiatry | Admitting: Psychiatry

## 2018-11-29 ENCOUNTER — Other Ambulatory Visit: Payer: Self-pay

## 2018-11-29 DIAGNOSIS — Z79899 Other long term (current) drug therapy: Secondary | ICD-10-CM

## 2018-11-29 DIAGNOSIS — F419 Anxiety disorder, unspecified: Secondary | ICD-10-CM | POA: Diagnosis not present

## 2018-11-29 DIAGNOSIS — R41843 Psychomotor deficit: Secondary | ICD-10-CM | POA: Diagnosis present

## 2018-11-29 DIAGNOSIS — F319 Bipolar disorder, unspecified: Secondary | ICD-10-CM | POA: Diagnosis present

## 2018-11-29 DIAGNOSIS — Z9119 Patient's noncompliance with other medical treatment and regimen: Secondary | ICD-10-CM

## 2018-11-29 DIAGNOSIS — F250 Schizoaffective disorder, bipolar type: Secondary | ICD-10-CM

## 2018-11-29 DIAGNOSIS — G4709 Other insomnia: Secondary | ICD-10-CM | POA: Diagnosis not present

## 2018-11-29 DIAGNOSIS — Z888 Allergy status to other drugs, medicaments and biological substances status: Secondary | ICD-10-CM

## 2018-11-29 DIAGNOSIS — F200 Paranoid schizophrenia: Principal | ICD-10-CM | POA: Diagnosis present

## 2018-11-29 DIAGNOSIS — F918 Other conduct disorders: Secondary | ICD-10-CM

## 2018-11-29 MED ORDER — CARBAMAZEPINE 200 MG PO TABS
200.0000 mg | ORAL_TABLET | Freq: Two times a day (BID) | ORAL | Status: DC
  Administered 2018-11-30 – 2018-12-03 (×6): 200 mg via ORAL
  Filled 2018-11-29 (×11): qty 1

## 2018-11-29 MED ORDER — ACETAMINOPHEN 325 MG PO TABS: 650.0000 mg | ORAL_TABLET | Freq: Four times a day (QID) | ORAL | Status: DC | PRN

## 2018-11-29 MED ORDER — ALUM & MAG HYDROXIDE-SIMETH 200-200-20 MG/5ML PO SUSP: 30.0000 mL | ORAL | Status: DC | PRN

## 2018-11-29 MED ORDER — HYDROXYZINE HCL 50 MG PO TABS
50.0000 mg | ORAL_TABLET | Freq: Four times a day (QID) | ORAL | Status: DC | PRN
  Filled 2018-11-29 (×2): qty 1

## 2018-11-29 MED ORDER — BENZTROPINE MESYLATE 1 MG PO TABS
1.0000 mg | ORAL_TABLET | Freq: Two times a day (BID) | ORAL | Status: DC
  Administered 2018-11-29 – 2018-12-03 (×7): 1 mg via ORAL
  Filled 2018-11-29 (×13): qty 1

## 2018-11-29 MED ORDER — HALOPERIDOL 5 MG PO TABS
5.0000 mg | ORAL_TABLET | Freq: Two times a day (BID) | ORAL | Status: DC
  Administered 2018-11-29: 5 mg via ORAL
  Filled 2018-11-29 (×5): qty 1

## 2018-11-29 MED ORDER — TRAZODONE HCL 50 MG PO TABS
50.0000 mg | ORAL_TABLET | Freq: Every evening | ORAL | Status: DC | PRN
  Filled 2018-11-29: qty 1

## 2018-11-29 MED ORDER — MAGNESIUM HYDROXIDE 400 MG/5ML PO SUSP: 30.0000 mL | Freq: Every day | ORAL | Status: DC | PRN

## 2018-11-29 NOTE — Tx Team (Signed)
Initial Treatment Plan 11/29/2018 9:38 PM [place] ISATA WOOLF CBJ:[id number]    PATIENT STRESSORS: Marital or family conflict Medication change or noncompliance   PATIENT STRENGTHS: General fund of knowledge Supportive family/friends   PATIENT IDENTIFIED PROBLEMS:   psychosis  Medication non-compliance  "going the natural course"  "I just need a escape goat, worried, something will go wrong with moms health, I need a mutual third party at home"               DISCHARGE CRITERIA:  Improved stabilization in mood, thinking, and/or behavior Verbal commitment to aftercare and medication compliance  PRELIMINARY DISCHARGE PLAN: Attend aftercare/continuing care group Outpatient therapy  PATIENT/FAMILY INVOLVEMENT: This treatment plan has been presented to and reviewed with the patient, Junior P Garde .  The patient and family have been given the opportunity to ask questions and make suggestions.  Delos Haring, RN 11/29/2018, 9:38 PM

## 2018-11-29 NOTE — ED Notes (Signed)
Transported to [facility] by [employer]. All belongings returned to pt. Pt calm and cooperative.

## 2018-11-29 NOTE — BH Assessment (Addendum)
[facility] Assessment Progress Note  Per Juanetta Beets, DO, this pt requires psychiatric hospitalization.  Malva Limes, RN, [facility] has assigned pt to [facility] Rm [id number]; she will call when [facility] is ready to receive pt.  Pt presents under IVC initiated by pt's mother, and upheld by Thedore Mins, MD, and  IVC documents have been faxed to [facility].  Pt's nurse, Diane, has been notified, and agrees to call report to [phone].  Pt is to be transported via Patent [employer].   Doylene Canning, [place] Behavioral Health Coordinator [phone]   Addendum:  At 15:00 this Clinical research associate spoke to [place], Charity fundraiser, [facility].  He reports that [facility] will be ready to accept this pt at 17:00.  Diane has been notified.  Doylene Canning, [place] Behavioral Health Coordinator [phone]

## 2018-11-29 NOTE — ED Notes (Signed)
Attempted to call report with no answer x2.

## 2018-11-29 NOTE — Progress Notes (Signed)
Pt admitted to [facility].  Pt searched and escorted to room.  Fifteen minute checks initiated for patient safety.  Pt safe on unit.

## 2018-11-29 NOTE — Consult Note (Addendum)
[facility] Face-to-Face Psychiatry Consult   Reason for Consult: psychotic, non-compliant with medication Referring Physician:  EDP Patient Identification: [name] MRN:  [id number] Principal Diagnosis: Schizoaffective disorder, bipolar type (HCC) Diagnosis:  Principal Problem:   Schizoaffective disorder, bipolar type (HCC)  Total Time spent with patient: 30 minutes  Subjective:   [name] is a 33 y.o. female patient admitted due to psychosis and disorganized behavior.  HPI:  Patient with long history of Paranoid schizophrenia and bipolar disorder who was brought to [facility] by [facility] police due to psychosis, erratic behavior, disorganized thought process and agitation. Collateral information from her mother revealed that patient stopped taking her medications about 6 months ago and has decompensated since then. Mother reports that patient has become more labile, easily agitated and has been talking to herself repetitively. Patient is too disorganized to give the sequence of events that lead to her current hospitalization.  Past Psychiatric History: as above  Risk to Self: Suicidal Ideation: No Suicidal Intent: No Is patient at risk for suicide?: No Suicidal Plan?: No Access to Means: No What has been your use of drugs/alcohol within the last 12 months?: Denies How many times?: 0 Other Self Harm Risks: (Medication non compliance) Triggers for Past Attempts: (NA) Intentional Self Injurious Behavior: None Risk to Others: Homicidal Ideation: No Thoughts of Harm to Others: No Current Homicidal Intent: No Current Homicidal Plan: No Access to Homicidal Means: No Identified Victim: NA History of harm to others?: No Assessment of Violence: None Noted Violent Behavior Description: NA Does 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 to weapons?: No Criminal Charges Pending?: No Does patient have a court date: No Prior Inpatient Therapy: Prior Inpatient Therapy: Yes Prior Therapy Dates: 2019 Prior Therapy  Facilty/Provider(s): [facility], [facility] Reason for Treatment: MH issues Prior Outpatient Therapy: Prior Outpatient Therapy: Yes Prior Therapy Dates: Ongoing Prior Therapy Facilty/Provider(s): [facility] Reason for Treatment: Med mang Does patient have an ACCT team?: No Does patient have Intensive In-House Services?  : No Does patient have [facility] services? : Yes Does patient have P4CC services?: No  Past Medical History:  Past Medical History:  Diagnosis Date  . Bipolar 1 disorder (HCC) 2011  . Paranoia (HCC)   . Schizoaffective disorder (HCC)    No past surgical history on file. Family History:  Family History  Problem Relation Age of Onset  . Cancer Father        lymphoma  . Hypertension Mother   . Diabetes Neg Hx   . Heart disease Neg Hx    Family Psychiatric  History:  None per chart review.  Social History:  Social History   Substance and Sexual Activity  Alcohol Use No   Comment: Pt denies      Social History   Substance and Sexual Activity  Drug Use No   Comment: Pt denies    Social History   Socioeconomic History  . Marital status: Single    Spouse name: Not on file  . Number of children: 0  . Years of education: some colle  . Highest education level: Not on file  Occupational History  . Occupation: [name]     Comment: trying to start   Social Needs  . Financial resource strain: Not on file  . Food insecurity:    Worry: Not on file    Inability: Not on file  . Transportation needs:    Medical: Not on file    Non-medical: Not on file  Tobacco Use  . Smoking status: Never Smoker  .  Smokeless tobacco: Never Used  Substance and Sexual Activity  . Alcohol use: No    Comment: Pt denies   . Drug use: No    Comment: Pt denies  . Sexual activity: Not Currently    Birth control/protection: Abstinence, None  Lifestyle  . Physical activity:    Days per week: Not on file    Minutes per session: Not on file  . Stress: Not on file  Relationships  . Social  connections:    Talks on phone: Not on file    Gets together: Not on file    Attends religious service: Not on file    Active member of club or organization: Not on file    Attends meetings of clubs or organizations: Not on file    Relationship status: Not on file  Other Topics Concern  . Not on file  Social History Narrative   Work or School: not working   Home Situation: lives with mother   Spiritual Beliefs: Christian   Lifestyle: no regular exercise; healthy diet         Additional Social History: N/A    Allergies:   Allergies  Allergen Reactions  . Quetiapine Other (See Comments)    Facial skin irritation  . Yes For Women [Intimacy Products] Rash  . Zyprexa [Olanzapine] Rash    Labs:  Results for orders placed or performed during the hospital encounter of 11/27/18 (from the past 48 hour(s))  CBC with Differential/Platelet     Status: None   Collection Time: 11/27/18 11:51 AM  Result Value Ref Range   WBC 4.2 4.0 - 10.5 K/uL   RBC 4.77 3.87 - 5.11 MIL/uL   Hemoglobin 12.9 12.0 - 15.0 g/dL   HCT 16.1 09.6 - 04.5 %   MCV 82.6 80.0 - 100.0 fL   MCH 27.0 26.0 - 34.0 pg   MCHC 32.7 30.0 - 36.0 g/dL   RDW 40.9 81.1 - 91.4 %   Platelets 288 150 - 400 K/uL   nRBC 0.0 0.0 - 0.2 %   Neutrophils Relative % 48 %   Neutro Abs 2.0 1.7 - 7.7 K/uL   Lymphocytes Relative 42 %   Lymphs Abs 1.8 0.7 - 4.0 K/uL   Monocytes Relative 8 %   Monocytes Absolute 0.3 0.1 - 1.0 K/uL   Eosinophils Relative 1 %   Eosinophils Absolute 0.1 0.0 - 0.5 K/uL   Basophils Relative 1 %   Basophils Absolute 0.1 0.0 - 0.1 K/uL   Immature Granulocytes 0 %   Abs Immature Granulocytes 0.00 0.00 - 0.07 K/uL    Comment: Performed at [facility], [address], [place], [place] [address]  Basic metabolic panel     Status: None   Collection Time: 11/27/18 11:51 AM  Result Value Ref Range   Sodium 136 135 - 145 mmol/L   Potassium 3.5 3.5 - 5.1 mmol/L   Chloride 100 98 - 111  mmol/L   CO2 27 22 - 32 mmol/L   Glucose, Bld 96 70 - 99 mg/dL   BUN 14 6 - 20 mg/dL   Creatinine, Ser 6.21 0.44 - 1.00 mg/dL   Calcium 9.2 8.9 - 30.8 mg/dL   GFR calc non Af Amer >60 >60 mL/min   GFR calc Af Amer >60 >60 mL/min   Anion gap 9 5 - 15    Comment: Performed at [facility], [address] [place], [place] [address]  Ethanol     Status:  None   Collection Time: 11/27/18 11:51 AM  Result Value Ref Range   Alcohol, Ethyl (B) <10 <10 mg/dL    Comment: (NOTE) Lowest detectable limit for serum alcohol is 10 mg/dL. For medical purposes only. Performed at [facility], [address] [place], [place] [address]   I-Stat beta hCG blood, ED     Status: None   Collection Time: 11/27/18 12:29 PM  Result Value Ref Range   I-stat hCG, quantitative <5.0 <5 mIU/mL   Comment 3            Comment:   GEST. AGE      CONC.  (mIU/mL)   <=1 WEEK        5 - 50     2 WEEKS       50 - 500     3 WEEKS       100 - 10,000     4 WEEKS     1,000 - 30,000        FEMALE AND NON-PREGNANT FEMALE:     LESS THAN 5 mIU/mL     Current Facility-Administered Medications  Medication Dose Route Frequency Provider Last Rate Last Dose  . benztropine (COGENTIN) tablet 1 mg  1 mg Oral BID Charm Rings, NP   1 mg at 11/29/18 0912  . carbamazepine (TEGRETOL) tablet 200 mg  200 mg Oral BID PC Akintayo, Mojeed, MD   200 mg at 11/29/18 0912  . haloperidol (HALDOL) tablet 5 mg  5 mg Oral BID Charm Rings, NP   5 mg at 11/29/18 0912  . hydrOXYzine (ATARAX/VISTARIL) tablet 50 mg  50 mg Oral Q6H PRN Charm Rings, NP   50 mg at 11/27/18 1323  . traZODone (DESYREL) tablet 50 mg  50 mg Oral QHS PRN,MR X 1 Lord, Herminio Heads, NP       Current Outpatient Medications  Medication Sig Dispense Refill  . benztropine (COGENTIN) 1 MG tablet Take 1 tablet (1 mg total) by mouth 2 (two) times daily. 60 tablet 0  . haloperidol (HALDOL) 5 MG tablet Take 1 tablet (5 mg total) by mouth 2  (two) times daily. 60 tablet 0  . hydrOXYzine (ATARAX/VISTARIL) 50 MG tablet Take 1 tablet (50 mg total) by mouth every 6 (six) hours as needed for anxiety. 30 tablet 0  . traZODone (DESYREL) 50 MG tablet Take 1 tablet (50 mg total) by mouth at bedtime as needed and may repeat dose one time if needed for sleep. 30 tablet 0    Musculoskeletal: Strength & Muscle Tone: within normal limits Gait & Station: normal Patient leans: N/A  Psychiatric Specialty Exam: Physical Exam  Nursing note and vitals reviewed. Constitutional: She is oriented to person, place, and time. She appears well-developed and well-nourished.  HENT:  Head: Normocephalic.  Neck: Normal range of motion.  Respiratory: Effort normal.  Musculoskeletal: Normal range of motion.  Neurological: She is alert and oriented to person, place, and time.  Psychiatric: Thought content normal. Her affect is labile. Her speech is rapid and/or pressured. She is actively hallucinating. Cognition and memory are normal. She expresses impulsivity.    Review of Systems  Constitutional: Negative.   HENT: Negative.   Eyes: Negative.   Respiratory: Negative.   Cardiovascular: Negative.   Gastrointestinal: Negative.   Genitourinary: Negative.   Musculoskeletal: Negative.   Skin: Negative.   Neurological: Negative.   Endo/Heme/Allergies: Negative.   Psychiatric/Behavioral: Positive for hallucinations.    Blood pressure 111/61,  pulse (!) 56, temperature 98.8 F (37.1 C), temperature source Oral, resp. rate 18, weight 60.3 kg, SpO2 100 %.Body mass index is 22.83 kg/m.  General Appearance: Casual  Eye Contact:  Fair  Speech:  Clear and Coherent  Volume:  Increased  Mood:  Irritable  Affect:  Labile  Thought Process:  Disorganized  Orientation:  Full (Time, Place, and Person)  Thought Content:  Illogical and Hallucinations: Auditory  Suicidal Thoughts:  No  Homicidal Thoughts:  No  Memory:  Immediate;   Fair Recent;   Fair Remote;    Fair  Judgement:  Impaired  Insight:  Lacking  Psychomotor Activity:  Restlessness  Concentration:  Concentration: Fair and Attention Span: Fair  Recall:  [employer] of Knowledge:  Fair  Language:  Good  Akathisia:  No  Handed:  Right  AIMS (if indicated):   N/A  Assets:  Housing Social Support  ADL's:  Intact  Cognition:  WNL  Sleep:   N/A     Treatment Plan Summary: Daily contact with patient to assess and evaluate symptoms and progress in treatment and Medication management  Schizophrenia, paranoid type: -Haldol 5 mg bid for psychosis -Carbamazepine 200 mg bid for bipolar -Haldol decanoate 100 mg given on 11/28/18 -Admit to inpatient psychiatric unit  EPS: -continued Cogentin 1 mg BID for EPS  Anxiety: -Vistaril 50 mg every six hours PRN   Insomnia: -continued Trazodone 50 mg at bedtime PRN  Disposition: Recommend psychiatric Inpatient admission when medically cleared.  Nanine Means, NP 11/29/2018 11:42 AM   Patient seen face-to-face for psychiatric evaluation, chart reviewed and case discussed with the physician extender and developed treatment plan. Reviewed the information documented and agree with the treatment plan.  Juanetta Beets, DO 11/30/18 1:19 PM

## 2018-11-29 NOTE — ED Notes (Signed)
Called report to [facility] at [facility]. [employer] called for transport.

## 2018-11-29 NOTE — Progress Notes (Signed)
Admission Note:  33 yr female who presented IVC in no acute distress for the treatment of psychosis and medication non-compliance. Pt appears flat and depressed. Pt was calm and cooperative with admission process. Pt was already on the unit at the beginning of the shift. Pt appeared irritable on the unit, pt seemed to be upset with her mother after talking to her on the phone on the unit. Pt just continued to state" I need a escape goat, a neutral third party at home"  A: Skin was assessed (by previous shift) , admission was completed as best that could be done at this time.   R: Pt had no additional questions or concerns.

## 2018-11-30 DIAGNOSIS — F200 Paranoid schizophrenia: Principal | ICD-10-CM

## 2018-11-30 MED ORDER — HALOPERIDOL 5 MG PO TABS
5.0000 mg | ORAL_TABLET | Freq: Three times a day (TID) | ORAL | Status: DC
  Administered 2018-11-30 – 2018-12-02 (×5): 5 mg via ORAL
  Filled 2018-11-30 (×9): qty 1

## 2018-11-30 MED ORDER — TRAZODONE HCL 150 MG PO TABS
150.0000 mg | ORAL_TABLET | Freq: Every evening | ORAL | Status: DC | PRN
  Filled 2018-11-30: qty 2
  Filled 2018-11-30: qty 1

## 2018-11-30 NOTE — Progress Notes (Signed)
D: Pt denies SI/HI/AVH. Pt is pleasant and cooperative most of the evening, pt visibly upset about not being D/C, but pt appeared to calm down when writer expressed she needed to keep it together to help her chances of D/C. Pt encouraged to continue to take her medication and talk with the doctor.    A: Pt was offered support and encouragement. Pt was encourage to attend groups. Q 15 minute checks were done for safety.   R: safety maintained on unit.  Problem: Coping: Goal: Ability to demonstrate self-control will improve Outcome: Progressing   Problem: Coping: Goal: Coping ability will improve Outcome: Progressing

## 2018-11-30 NOTE — [facility] Suicide Risk Assessment (Signed)
[facility] INPATIENT:  Family/Significant Other Suicide Prevention Education  Suicide Prevention Education:  Contact Attempts: Katriel Warzecha, mother, [phone], has been identified by the patient as the family member/significant other with whom the patient will be residing, and identified as the person(s) who will aid the patient in the event of a mental health crisis.  With written consent from the patient, two attempts were made to provide suicide prevention education, prior to and/or following the patient's discharge.  We were unsuccessful in providing suicide prevention education.  A suicide education pamphlet was given to the patient to share with family/significant other.  Date and time of first attempt:11/30/18, 1258 Date and time of second attempt:  Lorri Frederick, LCSW 11/30/2018, 12:58 PM

## 2018-11-30 NOTE — Progress Notes (Signed)
Recreation Therapy Notes  Date: 4.7.20 Time: 0950 Location: 500 Hall Dayroom  Group Topic: Coping Skills  Goal Area(s) Addresses:  Patient will identify positive coping skills. Patient will identify the importance of coping skills. Patient will identify benefit of using coping skills post d/c.  Behavioral Response:  Engaged  Intervention: Worksheet, Music  Activity:  Coping A to Z.  Patients were to identify a coping skill for each letter of the alphabet.  Patients would then share with the group their top 6 six coping skills.  Education: Pharmacologist, Building control surveyor.   Education Outcome: Acknowledges understanding/In group clarification offered/Needs additional education.   Clinical Observations/Feedback:  Pt arrived late to group.  Pt worked on her worksheet.  Pt asked for help when needed.  Pt identified some of her coping skills as carpentry, get hair done, learn something new, make paper airplane, baseball, eat ice cream, rest and read.    Caroll Rancher, LRT/CTRS     Lillia Abed, Clemmie Marxen A 11/30/2018 11:00 AM

## 2018-11-30 NOTE — Progress Notes (Signed)
Pt did not attend goals/orientation group this morning.  

## 2018-11-30 NOTE — Progress Notes (Signed)
Recreation Therapy Notes  Patient admitted to unit 4.6.20. Due to admission within last year, no new assessment conducted at this time. Last assessment conducted 9.18.19. Patient reports her stressors as "worry and stress".  Pt stated reason for admission was same as last time which was her "home situation".     Patient denies SI, HI, AVH at this time. Patient reports same goal as last admission which is "do outpatient services".  Information found below from assessment conducted 9.18.19   Coping Skills: Isolation, Arguments, Write, TV, Music, Exercise, Hot Bath/Shower, Meditate, Impulsivity, Prayer, Avoidance, Read, Deep Breathing  Leisure Interests:  Jump rope; [place]  Patient Strengths:  Analyze well; Reading  Areas of Improvement:  Communication; Feelings     [name] Lillia Abed, LRT/CTRS     Caroll Rancher A 11/30/2018 11:50 AM

## 2018-11-30 NOTE — Progress Notes (Signed)
Adult Psychoeducational Group Note  Date:  11/30/2018 Time:  9:28 PM  Group Topic/Focus:  Wrap-Up Group:   The focus of this group is to help patients review their daily goal of treatment and discuss progress on daily workbooks.  Participation Level:  Active  Participation Quality:  Appropriate  Affect:  Appropriate  Cognitive:  Appropriate  Insight: Appropriate  Engagement in Group:  Engaged  Modes of Intervention:  Discussion  Additional Comments:  Patient attended wrap-up group and said that her day was a 8.  Her coping skill for today were attending groups, watching tv and sleep.   Raini Tiley W Herma Uballe 11/30/2018, 9:28 PM

## 2018-11-30 NOTE — Progress Notes (Signed)
DAR NOTE: Patient presents with anxious affect and depressed mood.  Patient still guarded and paranoid.  Denies suicidal thoughts, pain, auditory and visual hallucinations.  Rates depression at 0, hopelessness at 0, and anxiety at 0.  Maintained on routine safety checks.  Medications given as prescribed.  Support and encouragement offered as needed.  Attended group and participated.  States goal for today is "keeping up with peers."  Patient is withdrawn and isolates to her room.  Needed a lot of encouragements before taking her medications.  Offered no complaint.
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
## 2018-11-30 NOTE — [facility] Group Notes (Signed)
[facility] LCSW Group Therapy Note  Date/Time: 11/30/18, 1100  Type of Therapy/Topic:  Group Therapy:  Feelings about Diagnosis  Participation Level:  Active   Mood:pleasant   Description of Group:    This group will allow patients to explore their thoughts and feelings about diagnoses they have received. Patients will be guided to explore their level of understanding and acceptance of these diagnoses. Facilitator will encourage patients to process their thoughts and feelings about the reactions of others to their diagnosis, and will guide patients in identifying ways to discuss their diagnosis with significant others in their lives. This group will be process-oriented, with patients participating in exploration of their own experiences as well as giving and receiving support and challenge from other group members.   Therapeutic Goals: 1. Patient will demonstrate understanding of diagnosis as evidence by identifying two or more symptoms of the disorder:  2. Patient will be able to express two feelings regarding the diagnosis 3. Patient will demonstrate ability to communicate their needs through discussion and/or role plays  Summary of Patient Progress:Pt pleasant and engaged in group discussion and made a number of comments.  Pt was able to identify symptoms of the diagnoses we discussed.  Good participation overall.         Therapeutic Modalities:   Cognitive Behavioral Therapy Brief Therapy Feelings Identification   Daleen Squibb, LCSW

## 2018-11-30 NOTE — H&P (Signed)
Psychiatric Admission Assessment Adult  Patient Identification: [name] MRN:  [id number] Date of Evaluation:  11/30/2018 Chief Complaint: Patient specific chief complaint is "it was a misunderstanding" yet she is here for chronic paranoid schizophrenia exacerbation  principal Diagnosis: Exacerbation and underlying psychotic disorder Diagnosis:  Active Problems:   Schizophrenia, paranoid type (HCC)  History of Present Illness:  This is the latest of multiple admissions and encounters for [name] 33 year old patient known to have a chronic psychotic disorder who has been admitted here previously, as recently as 6 months ago, as well as at other facilities where I have treated her as recently as 2016. Her presentations are generally the same she is noncompliant with her antipsychotics and deteriorates as far as her cognitive functioning and behavior. She presented on 4/4 complaining of noncompliance for least 6 months and mother was concerned about her safety specifically fearing that she will wander in traffic in a disorganized state While in the emergency department since 4/4 (transferred to our floor in 4/6) she was noted to have hallucinations, intermittent disorientation as to place situation so forth, responding to stimuli, and displaying disorganized thought and behavior.  The present time she is little more coherent she is oriented to the fact she is hospitalized she does not know the day date or time states she is here "because of a misunderstanding[place]" and begins to ramble in this type fashion saying nothing particular meaningful or related to her situation. Whispering and mumbling to herself as well so possibly still hallucinating.  Denied drug use  Denied head trauma  Received Haldol decanoate long-acting injectable 100 mg IM through the emergency department prior to being transferred here  Associated Signs/Symptoms: Depression Symptoms:   psychomotor retardation, (Hypo) Manic Symptoms:  Distractibility, Anxiety Symptoms:  Excessive Worry, Psychotic Symptoms:  Delusions, Hallucinations: Auditory PTSD Symptoms: NA Total Time spent with patient: 45 minutes  Past Psychiatric History: Sensitive as discussed  Is the patient at risk to self? Yes.    Has the patient been a risk to self in the past 6 months? Yes.    Has the patient been a risk to self within the distant past? Yes.    Is the patient a risk to others? No.  Has the patient been a risk to others in the past 6 months? No.  Has the patient been a risk to others within the distant past? No.   Prior Inpatient Therapy:   Prior Outpatient Therapy:    Alcohol Screening: 1. How often do you have a drink containing alcohol?: Never 2. How many drinks containing alcohol do you have on a typical day when you are drinking?: 1 or 2 3. How often do you have six or more drinks on one occasion?: Never AUDIT-C Score: 0 4. How often during the last year have you found that you were not able to stop drinking once you had started?: Never 5. How often during the last year have you failed to do what was normally expected from you becasue of drinking?: Never 6. How often during the last year have you needed a first drink in the morning to get yourself going after a heavy drinking session?: Never 7. How often during the last year have you had a feeling of guilt of remorse after drinking?: Never 8. How often during the last year have you been unable to remember what happened the night before because you had been drinking?: Never 9. Have you  or someone else been injured as a result of your drinking?: No 10. Has a relative or friend or a doctor or another health worker been concerned about your drinking or suggested you cut down?: No Alcohol Use Disorder Identification Test Final Score (AUDIT): 0 Alcohol Brief Interventions/Follow-up: AUDIT Score <7 follow-up not indicated Substance Abuse  History in the last 12 months:  No. Consequences of Substance Abuse: NA Previous Psychotropic Medications: It appears Haldol has been her best therapy Psychological Evaluations: No  Past Medical History:  Past Medical History:  Diagnosis Date  . Bipolar 1 disorder (HCC) 2011  . Paranoia (HCC)   . Schizoaffective disorder (HCC)    History reviewed. No pertinent surgical history. Family History:  Family History  Problem Relation Age of Onset  . Cancer Father        lymphoma  . Hypertension Mother   . Diabetes Neg Hx   . Heart disease Neg Hx    Family Psychiatric  History: Unknown to patient Tobacco Screening: Have you used any form of tobacco in the last 30 days? (Cigarettes, Smokeless Tobacco, Cigars, and/or Pipes): No Social History:  Social History   Substance and Sexual Activity  Alcohol Use No   Comment: Pt denies      Social History   Substance and Sexual Activity  Drug Use No   Comment: Pt denies    Additional Social History: Marital status: Single Are you sexually active?: No What is your sexual orientation?: Heterosexual Has your sexual activity been affected by drugs, alcohol, medication, or emotional stress?: No Does patient have children?: No                         Allergies:   Allergies  Allergen Reactions  . Quetiapine Other (See Comments)    Facial skin irritation  . Yes For Women [Intimacy Products] Rash  . Zyprexa [Olanzapine] Rash   Lab Results: No results found for this or any previous visit (from the past 48 hour(s)).  Blood Alcohol level:  Lab Results  Component Value Date   ETH <10 11/27/2018   ETH <10 05/11/2018    Metabolic Disorder Labs:  Lab Results  Component Value Date   HGBA1C 5.5 11/02/2017   MPG 111.15 11/02/2017   No results found for: PROLACTIN Lab Results  Component Value Date   CHOL 179 07/27/2014   TRIG 49 07/27/2014   HDL 44 07/27/2014   CHOLHDL 5 06/21/2014   VLDL 10 07/27/2014   LDLCALC 125 (H)  07/27/2014   LDLCALC 100 (H) 06/21/2014    Current Medications: Current Facility-Administered Medications  Medication Dose Route Frequency Provider Last Rate Last Dose  . acetaminophen (TYLENOL) tablet 650 mg  650 mg Oral Q6H PRN Charm Rings, NP      . alum & mag hydroxide-simeth (MAALOX/MYLANTA) 200-200-20 MG/5ML suspension 30 mL  30 mL Oral Q4H PRN Charm Rings, NP      . benztropine (COGENTIN) tablet 1 mg  1 mg Oral BID Charm Rings, NP   1 mg at 11/30/18 0831  . carbamazepine (TEGRETOL) tablet 200 mg  200 mg Oral BID PC Charm Rings, NP   200 mg at 11/30/18 0831  . haloperidol (HALDOL) tablet 5 mg  5 mg Oral TID Malvin Johns, MD      . hydrOXYzine (ATARAX/VISTARIL) tablet 50 mg  50 mg Oral Q6H PRN Charm Rings, NP      . magnesium hydroxide (MILK OF  MAGNESIA) suspension 30 mL  30 mL Oral Daily PRN Charm Rings, NP      . traZODone (DESYREL) tablet 150 mg  150 mg Oral QHS PRN,MR X 1 Malvin Johns, MD       PTA Medications: Medications Prior to Admission  Medication Sig Dispense Refill Last Dose  . benztropine (COGENTIN) 1 MG tablet Take 1 tablet (1 mg total) by mouth 2 (two) times daily. 60 tablet 0   . haloperidol (HALDOL) 5 MG tablet Take 1 tablet (5 mg total) by mouth 2 (two) times daily. 60 tablet 0   . hydrOXYzine (ATARAX/VISTARIL) 50 MG tablet Take 1 tablet (50 mg total) by mouth every 6 (six) hours as needed for anxiety. 30 tablet 0   . traZODone (DESYREL) 50 MG tablet Take 1 tablet (50 mg total) by mouth at bedtime as needed and may repeat dose one time if needed for sleep. 30 tablet 0     Musculoskeletal: Strength & Muscle Tone: within normal limits Gait & Station: normal Patient leans: N/A  Psychiatric Specialty Exam: Physical Exam  ROS  Blood pressure 100/76, pulse 86, temperature 98.3 F (36.8 C), temperature source Oral, resp. rate 16, height  (1.626 m), weight 60 kg.Body mass index is 22.71 kg/m.  General Appearance: Guarded  Eye Contact:   Fair  Speech:  Garbled  Volume:  Decreased  Mood:  Euthymic  Affect:  Congruent  Thought Process:  Disorganized  Orientation:  Other:  Person general place/situation only  Thought Content:  Illogical, Delusions and Hallucinations: Auditory  Suicidal Thoughts:  No  Homicidal Thoughts:  No  Memory:  Immediate;   Poor  Judgement:  Impaired  Insight:  Lacking  Psychomotor Activity:  Decreased  Concentration:  Concentration: Poor  Recall:  Poor  Fund of Knowledge:  Poor  Language:  Poor numerous nonsensical statements  Akathisia:  Negative  Handed:  Right  AIMS (if indicated):     Assets:  Physical Health Resilience  ADL's:  Intact  Cognition:  WNL  Sleep:  Number of Hours: 7.75    Treatment Plan Summary: Daily contact with patient to assess and evaluate symptoms and progress in treatment, Medication management and Plan Begin Haldol oral to supplement long-acting injectable continue cognitive and reality based therapies  Observation Level/Precautions:  15 minute checks  Laboratory:  UDS  Psychotherapy: Reality based  Medications: Haldol oral  Consultations: Not necessary  Discharge Concerns: Long-term compliance  Estimated LOS: 5-7 perhaps longer  Other: Axis I schizophrenia chronic paranoid type acute exacerbation/drug screen negative for all compounds tested 1 year ago no current drug screen   Physician Treatment Plan for Primary Diagnosis: <principal problem not specified> Long Term Goal(s): Improvement in symptoms so as ready for discharge  Short Term Goals: Ability to verbalize feelings will improve  Physician Treatment Plan for Secondary Diagnosis: Active Problems:   Schizophrenia, paranoid type (HCC)  Long Term Goal(s): Improvement in symptoms so as ready for discharge  Short Term Goals: Ability to identify and develop effective coping behaviors will improve  I certify that inpatient services furnished can reasonably be expected to improve the patient's  condition.    Malvin Johns, MD 4/7/202010:53 AM

## 2018-11-30 NOTE — [facility] Suicide Risk Assessment (Signed)
[facility] Admission Suicide Risk Assessment   Nursing information obtained from:  Patient Demographic factors:  Unemployed Current Mental Status:  NA Loss Factors:  NA Historical Factors:  NA Risk Reduction Factors:  NA  Total Time spent with patient: 45 minutes Principal Problem: Sedation and longstanding psychotic disorder Diagnosis:  Active Problems:   Schizophrenia, paranoid type (HCC)  Subjective Data: noncompliance for months leading to psychosis  Continued Clinical Symptoms:  Alcohol Use Disorder Identification Test Final Score (AUDIT): 0 The "Alcohol Use Disorders Identification Test", Guidelines for Use in Primary Care, Second Edition.  World Science writer [facility]). Score between 0-7:  no or low risk or alcohol related problems. Score between 8-15:  moderate risk of alcohol related problems. Score between 16-19:  high risk of alcohol related problems. Score 20 or above:  warrants further diagnostic evaluation for alcohol dependence and treatment.   CLINICAL FACTORS:   Schizophrenia:   Paranoid or undifferentiated type   COGNITIVE FEATURES THAT CONTRIBUTE TO RISK:  Loss of executive function    SUICIDE RISK:   Minimal: No identifiable suicidal ideation.  Patients presenting with no risk factors but with morbid ruminations; may be classified as minimal risk based on the severity of the depressive symptoms  PLAN OF CARE: Long-acting injectable already administered continue oral supplementation  I certify that inpatient services furnished can reasonably be expected to improve the patient's condition.   Malvin Johns, MD 11/30/2018, 10:51 AM

## 2018-12-01 NOTE — Progress Notes (Signed)
Pt did not attend goals/orientation group.  

## 2018-12-01 NOTE — Tx Team (Signed)
Interdisciplinary Treatment and Diagnostic Plan Update  12/01/2018 Time of Session: 0905 Monique Leydenansy P Volden MRN: [id number]  Principal Diagnosis: <principal problem not specified>  Secondary Diagnoses: Active Problems:   Schizophrenia, paranoid type (HCC)   Current Medications:  Current Facility-Administered Medications  Medication Dose Route Frequency Provider Last Rate Last Dose  . acetaminophen (TYLENOL) tablet 650 mg  650 mg Oral Q6H PRN Charm RingsLord, Jamison Y, NP      . alum & mag hydroxide-simeth (MAALOX/MYLANTA) 200-200-20 MG/5ML suspension 30 mL  30 mL Oral Q4H PRN Charm RingsLord, Jamison Y, NP      . benztropine (COGENTIN) tablet 1 mg  1 mg Oral BID Charm RingsLord, Jamison Y, NP   1 mg at 12/01/18 0802  . carbamazepine (TEGRETOL) tablet 200 mg  200 mg Oral BID PC Charm RingsLord, Jamison Y, NP   200 mg at 12/01/18 [id number]  . haloperidol (HALDOL) tablet 5 mg  5 mg Oral TID Malvin JohnsFarah, Brian, MD   5 mg at 12/01/18 0802  . hydrOXYzine (ATARAX/VISTARIL) tablet 50 mg  50 mg Oral Q6H PRN Charm RingsLord, Jamison Y, NP      . magnesium hydroxide (MILK OF MAGNESIA) suspension 30 mL  30 mL Oral Daily PRN Charm RingsLord, Jamison Y, NP      . traZODone (DESYREL) tablet 150 mg  150 mg Oral QHS PRN,MR X 1 Malvin JohnsFarah, Brian, MD       PTA Medications: Medications Prior to Admission  Medication Sig Dispense Refill Last Dose  . benztropine (COGENTIN) 1 MG tablet Take 1 tablet (1 mg total) by mouth 2 (two) times daily. 60 tablet 0   . haloperidol (HALDOL) 5 MG tablet Take 1 tablet (5 mg total) by mouth 2 (two) times daily. 60 tablet 0   . hydrOXYzine (ATARAX/VISTARIL) 50 MG tablet Take 1 tablet (50 mg total) by mouth every 6 (six) hours as needed for anxiety. 30 tablet 0   . traZODone (DESYREL) 50 MG tablet Take 1 tablet (50 mg total) by mouth at bedtime as needed and may repeat dose one time if needed for sleep. 30 tablet 0     Patient Stressors: Marital or family conflict Medication change or noncompliance  Patient Strengths: General fund of knowledge Supportive  family/friends  Treatment Modalities: Medication Management, Group therapy, Case management,  1 to 1 session with clinician, Psychoeducation, Recreational therapy.   Physician Treatment Plan for Primary Diagnosis: <principal problem not specified> Long Term Goal(s): Improvement in symptoms so as ready for discharge Improvement in symptoms so as ready for discharge   Short Term Goals: Ability to verbalize feelings will improve Ability to identify and develop effective coping behaviors will improve  Medication Management: Evaluate patient's response, side effects, and tolerance of medication regimen.  Therapeutic Interventions: 1 to 1 sessions, Unit Group sessions and Medication administration.  Evaluation of Outcomes: Progressing  Physician Treatment Plan for Secondary Diagnosis: Active Problems:   Schizophrenia, paranoid type (HCC)  Long Term Goal(s): Improvement in symptoms so as ready for discharge Improvement in symptoms so as ready for discharge   Short Term Goals: Ability to verbalize feelings will improve Ability to identify and develop effective coping behaviors will improve     Medication Management: Evaluate patient's response, side effects, and tolerance of medication regimen.  Therapeutic Interventions: 1 to 1 sessions, Unit Group sessions and Medication administration.  Evaluation of Outcomes: Progressing   RN Treatment Plan for Primary Diagnosis: <principal problem not specified> Long Term Goal(s): Knowledge of disease and therapeutic regimen to maintain health will improve  Short  Term Goals: Ability to identify and develop effective coping behaviors will improve and Compliance with prescribed medications will improve  Medication Management: RN will administer medications as ordered by provider, will assess and evaluate patient's response and provide education to patient for prescribed medication. RN will report any adverse and/or side effects to prescribing  provider.  Therapeutic Interventions: 1 on 1 counseling sessions, Psychoeducation, Medication administration, Evaluate responses to treatment, Monitor vital signs and CBGs as ordered, Perform/monitor CIWA, COWS, AIMS and Fall Risk screenings as ordered, Perform wound care treatments as ordered.  Evaluation of Outcomes: Progressing   LCSW Treatment Plan for Primary Diagnosis: <principal problem not specified> Long Term Goal(s): Safe transition to appropriate next level of care at discharge, Engage patient in therapeutic group addressing interpersonal concerns.  Short Term Goals: Engage patient in aftercare planning with referrals and resources, Increase social support and Increase skills for wellness and recovery  Therapeutic Interventions: Assess for all discharge needs, 1 to 1 time with Social worker, Explore available resources and support systems, Assess for adequacy in community support network, Educate family and significant other(s) on suicide prevention, Complete Psychosocial Assessment, Interpersonal group therapy.  Evaluation of Outcomes: Progressing   Progress in Treatment: Attending groups: Yes. Participating in groups: Yes. Taking medication as prescribed: Yes. Toleration medication: Yes. Family/Significant other contact made: No, will contact:  mother Patient understands diagnosis: No. Discussing patient identified problems/goals with staff: Yes. Medical problems stabilized or resolved: Yes. Denies suicidal/homicidal ideation: Yes. Issues/concerns per patient self-inventory: No. Other: none  New problem(s) identified: No, Describe:  none  New Short Term/Long Term Goal(s):  Patient Goals:  "dischage"  Discharge Plan or Barriers:   Reason for Continuation of Hospitalization: Hallucinations Medication stabilization  Estimated Length of Stay: 2-4 days.  Attendees: Patient:[name] 12/01/2018   Physician: Dr. Jeannine Kitten, MD 12/01/2018   Nursing: Dossie Arbour, RN  12/01/2018   RN Care Manager: 12/01/2018   Social Worker: Daleen Squibb, LCSW 12/01/2018   Recreational Therapist:  12/01/2018   Other:  12/01/2018   Other:  12/01/2018  Other: 12/01/2018     Scribe for Treatment Team: Lorri Frederick, LCSW 12/01/2018 10:19 AM

## 2018-12-01 NOTE — Plan of Care (Signed)
  Problem: Activity: Goal: Interest or engagement in activities will improve Outcome: Progressing   D: Pt alert and oriented on the unit. Pt denies SI/HI, A/VH, but appears to be responding to internal stimuli. Pt participated during unit groups and activities. Pt cooperative on the unit. A: Education, support and encouragement provided, q15 minute safety checks remain in effect. Medications administered per MD orders. R: No reactions/side effects to medicine noted. Pt denies any concerns at this time, and verbally contracts for safety. Pt ambulating on the unit with no issues. Pt remains safe on and off the unit.

## 2018-12-01 NOTE — [facility] Suicide Risk Assessment (Signed)
[facility] INPATIENT:  Family/Significant Other Suicide Prevention Education  Suicide Prevention Education:  Education Completed;  has been identified by the patient as the family member/significant other with whom the patient will be residing, and identified as the person(s) who will aid the patient in the event of a mental health crisis (suicidal ideations/suicide attempt).  With written consent from the patient, the family member/significant other has been provided the following suicide prevention education, prior to the and/or following the discharge of the patient.  The suicide prevention education provided includes the following:  Suicide risk factors  Suicide prevention and interventions  National Suicide Hotline telephone number  [facility] assessment telephone number  [facility] Emergency Assistance 911  [facility] and/or Residential Mobile Crisis Unit telephone number  Request made of family/significant other to:  Remove weapons (e.g., guns, rifles, knives), all items previously/currently identified as safety concern.  No guns, per mother.   Remove drugs/medications (over-the-counter, prescriptions, illicit drugs), all items previously/currently identified as a safety concern.  The family member/significant other verbalizes understanding of the suicide prevention education information provided.  The family member/significant other agrees to remove the items of safety concern listed above.  Mother reports she has spoken to pt daily since she has been at [facility] and she seems like she is doing better.    Lorri Frederick, LCSW 12/01/2018, 3:15 PM

## 2018-12-01 NOTE — Progress Notes (Signed)
Recreation Therapy Notes  Date: 4.8.20 Time: 1000 Location: 500 Hall Dayroom  Group Topic: Anxiety  Goal Area(s) Addresses:  Patient will identify triggers for anxiety. Patient will identify physical symptoms they have when anxious. Patient will identify coping skills for anxiety.   Behavioral Response:  Engaged  Intervention:  Worksheet  Activity: Intro to Anxiety.  Patients were to identify the things that trigger their anxiety, physical symptoms they have when anxious, thoughts they have when anxious and ways they cope with anxiety.  Education: Communication, Discharge Planning  Education Outcome: Acknowledges understanding/In group clarification offered/Needs additional education.   Clinical Observations/Feedback: Pt identified triggers to anxiety as not paying bills on time, unexpected bills and not knowing.  Symptoms to anxiety are sweaty palms, can't sleep and reckless behavior.  Thoughts that occur when anxious are cursing and "had I known it would be like this, I wouldn't have done it".  Pt identified coping skills as stress ball, squishy ball and counting to 10 backwards.     Caroll Rancher, LRT/CTRS     Lillia Abed, Isha Seefeld A 12/01/2018 11:11 AM

## 2018-12-01 NOTE — [facility] Group Notes (Signed)
  [facility] LCSW Group Therapy Note  Date/Time: 12/01/18, 1315  Type of Therapy/Topic:  Group Therapy:  Emotion Regulation  Participation Level:  Active   Mood: pleasant  Description of Group:    The purpose of this group is to assist patients in learning to regulate negative emotions and experience positive emotions. Patients will be guided to discuss ways in which they have been vulnerable to their negative emotions. These vulnerabilities will be juxtaposed with experiences of positive emotions or situations, and patients challenged to use positive emotions to combat negative ones. Special emphasis will be placed on coping with negative emotions in conflict situations, and patients will process healthy conflict resolution skills.  Therapeutic Goals: 1. Patient will identify two positive emotions or experiences to reflect on in order to balance out negative emotions:  2. Patient will label two or more emotions that they find the most difficult to experience:  3. Patient will be able to demonstrate positive conflict resolution skills through discussion or role plays:   Summary of Patient Progress:Pt was engaged throughout and made several comments during group discussion.  Pt said that sadness is an emotion that is difficult for her to experience and participated in the discussion about positive ways to handle negative emotions.         Therapeutic Modalities:   Cognitive Behavioral Therapy Feelings Identification Dialectical Behavioral Therapy  Daleen Squibb, LCSW

## 2018-12-01 NOTE — Progress Notes (Signed)
Nursing Progress Note: 7p-7a D: Pt currently presents with a anxiety/paranoia/blocking/circumstantial affect and behavior. Pt states "I don't want any meds. I'm fine. I need to talk to my mother, but I can't reach her. Why doesn't anyone understand me?" Interacting suspiciously with the milieu. Pt reports good sleep during the previous night with current medication regimen.   A: Pt refused medications per providers orders. Pt's labs and vitals were monitored throughout the night. Pt supported emotionally and encouraged to express concerns and questions. Pt educated on medications.  R: Pt's safety ensured with 15 minute and environmental checks. Pt currently denies SI, HI, and AVH, but observed talking to herself in hall and room. Pt verbally contracts to seek staff if SI,HI, or AVH occurs and to consult with staff before acting on any harmful thoughts. Will continue to monitor.

## 2018-12-01 NOTE — Progress Notes (Signed)
[facility] MD Progress Note  12/01/2018 9:02 AM [name]  MRN:  [id number] Subjective:    Patient is showing some improvement as far as her cognition, focused on discharge therefore will likely minimize symptoms if they are present. EPS or TD Pliant with oral meds and has long-acting injectable on board Denies positive symptoms Oriented fully conversant overall Principal Problem: Chronic noncompliance and frequent relapse regarding chronic psychotic disorder Diagnosis: Active Problems:   Schizophrenia, paranoid type (HCC)  Total Time spent with patient: 20 minutes  Past Medical History:  Past Medical History:  Diagnosis Date  . Bipolar 1 disorder (HCC) 2011  . Paranoia (HCC)   . Schizoaffective disorder (HCC)    History reviewed. No pertinent surgical history. Family History:  Family History  Problem Relation Age of Onset  . Cancer Father        lymphoma  . Hypertension Mother   . Diabetes Neg Hx   . Heart disease Neg Hx    Social History:  Social History   Substance and Sexual Activity  Alcohol Use No   Comment: Pt denies      Social History   Substance and Sexual Activity  Drug Use No   Comment: Pt denies    Social History   Socioeconomic History  . Marital status: Single    Spouse name: Not on file  . Number of children: 0  . Years of education: some colle  . Highest education level: Not on file  Occupational History  . Occupation: [name]     Comment: trying to start   Social Needs  . Financial resource strain: Not on file  . Food insecurity:    Worry: Not on file    Inability: Not on file  . Transportation needs:    Medical: Not on file    Non-medical: Not on file  Tobacco Use  . Smoking status: Never Smoker  . Smokeless tobacco: Never Used  Substance and Sexual Activity  . Alcohol use: No    Comment: Pt denies   . Drug use: No    Comment: Pt denies  . Sexual activity: Not Currently    Birth control/protection: Abstinence, None  Lifestyle   . Physical activity:    Days per week: Not on file    Minutes per session: Not on file  . Stress: Not on file  Relationships  . Social connections:    Talks on phone: Not on file    Gets together: Not on file    Attends religious service: Not on file    Active member of club or organization: Not on file    Attends meetings of clubs or organizations: Not on file    Relationship status: Not on file  Other Topics Concern  . Not on file  Social History Narrative   Work or School: not working   Home Situation: lives with mother   Spiritual Beliefs: Christian   Lifestyle: no regular exercise; healthy diet         Additional Social History:                         Sleep: Good  Appetite:  Good  Current Medications: Current Facility-Administered Medications  Medication Dose Route Frequency Provider Last Rate Last Dose  . acetaminophen (TYLENOL) tablet 650 mg  650 mg Oral Q6H PRN Charm Rings, NP      . alum & mag hydroxide-simeth (MAALOX/MYLANTA) 200-200-20 MG/5ML suspension 30 mL  30 mL Oral Q4H PRN Charm RingsLord, Jamison Y, NP      . benztropine (COGENTIN) tablet 1 mg  1 mg Oral BID Charm RingsLord, Jamison Y, NP   1 mg at 12/01/18 0802  . carbamazepine (TEGRETOL) tablet 200 mg  200 mg Oral BID PC Charm RingsLord, Jamison Y, NP   200 mg at 12/01/18 [id number]  . haloperidol (HALDOL) tablet 5 mg  5 mg Oral TID Malvin JohnsFarah, Demonte Dobratz, MD   5 mg at 12/01/18 0802  . hydrOXYzine (ATARAX/VISTARIL) tablet 50 mg  50 mg Oral Q6H PRN Charm RingsLord, Jamison Y, NP      . magnesium hydroxide (MILK OF MAGNESIA) suspension 30 mL  30 mL Oral Daily PRN Charm RingsLord, Jamison Y, NP      . traZODone (DESYREL) tablet 150 mg  150 mg Oral QHS PRN,MR X 1 Malvin JohnsFarah, Shauniece Kwan, MD        Lab Results: No results found for this or any previous visit (from the past 48 hour(s)).  Blood Alcohol level:  Lab Results  Component Value Date   ETH <10 11/27/2018   ETH <10 05/11/2018    Metabolic Disorder Labs: Lab Results  Component Value Date   HGBA1C 5.5  11/02/2017   MPG 111.15 11/02/2017   No results found for: PROLACTIN Lab Results  Component Value Date   CHOL 179 07/27/2014   TRIG 49 07/27/2014   HDL 44 07/27/2014   CHOLHDL 5 06/21/2014   VLDL 10 07/27/2014   LDLCALC 125 (H) 07/27/2014   LDLCALC 100 (H) 06/21/2014    Physical Findings: AIMS: Facial and Oral Movements Muscles of Facial Expression: None, normal Lips and Perioral Area: None, normal Jaw: None, normal Tongue: None, normal,Extremity Movements Upper (arms, wrists, hands, fingers): None, normal Lower (legs, knees, ankles, toes): None, normal, Trunk Movements Neck, shoulders, hips: None, normal, Overall Severity Severity of abnormal movements (highest score from questions above): None, normal Incapacitation due to abnormal movements: None, normal Patient's awareness of abnormal movements (rate only patient's report): No Awareness, Dental Status Current problems with teeth and/or dentures?: No Does patient usually wear dentures?: No  CIWA:  CIWA-Ar Total: 0 COWS:  COWS Total Score: 1  Musculoskeletal: Strength & Muscle Tone: within normal limits Gait & Station: normal Patient leans: N/A  Psychiatric Specialty Exam: Physical Exam  ROS  Blood pressure 100/76, pulse 86, temperature 98.3 F (36.8 C), temperature source Oral, resp. rate 16, height 5\' 4"  (1.626 m), weight 60 kg.Body mass index is 22.71 kg/m.  General Appearance: Fairly Groomed  Eye Contact:  Fair  Speech:  Normal Rate  Volume:  Decreased  Mood:  Euthymic  Affect:  Congruent  Thought Process:  Goal Directed  Orientation:  Full (Time, Place, and Person)  Thought Content:  Tangential  Suicidal Thoughts:  No  Homicidal Thoughts:  No  Memory:  Immediate;   Fair  Judgement:  Fair  Insight:  Fair  Psychomotor Activity:  Normal  Concentration:  Concentration: Fair  Recall:  FiservFair  Fund of Knowledge:  Fair  Language:  nl  Akathisia:  Negative  Handed:  Right  AIMS (if indicated):     Assets:   Physical Health Resilience Social Support  ADL's:  Intact  Cognition:  WNL  Sleep:  Number of Hours: 8.25     Treatment Plan Summary: Daily contact with patient to assess and evaluate symptoms and progress in treatment, Medication management and Plan No change in current meds or precautions continue cognitive and reality based therapies probable discharge in 2 to  3 days  Malvin Johns, MD 12/01/2018, 9:02 AM

## 2018-12-02 MED ORDER — HALOPERIDOL 5 MG PO TABS
5.0000 mg | ORAL_TABLET | Freq: Two times a day (BID) | ORAL | Status: DC
  Administered 2018-12-02 – 2018-12-03 (×2): 5 mg via ORAL
  Filled 2018-12-02 (×6): qty 1

## 2018-12-02 MED ORDER — HALOPERIDOL 5 MG PO TABS
10.0000 mg | ORAL_TABLET | Freq: Every day | ORAL | Status: DC
  Filled 2018-12-02 (×3): qty 2

## 2018-12-02 NOTE — [facility] Group Notes (Signed)
Adult Psychoeducational Group Note  Date:  12/02/2018 Time:  9:02 AM  Group Topic/Focus:  Goals Group:   The focus of this group is to help patients establish daily goals to achieve during treatment and discuss how the patient can incorporate goal setting into their daily lives to aide in recovery.  Participation Level:  Minimal  Participation Quality:  Appropriate  Affect:  Appropriate  Cognitive:  Appropriate  Insight: Appropriate  Engagement in Group:  Engaged  Modes of Intervention:  Discussion  Additional Comments:  Pt attended morning goals group.   [name] [name] 12/02/2018, 9:02 AM

## 2018-12-02 NOTE — [facility] Group Notes (Signed)
[facility] LCSW Group Therapy Note  Date/Time: 12/02/18, 1315  Type of Therapy/Topic:  Group Therapy:  Balance in Life  Participation Level:  Did not attend  Description of Group:    This group will address the concept of balance and how it feels and looks when one is unbalanced. Patients will be encouraged to process areas in their lives that are out of balance, and identify reasons for remaining unbalanced. Facilitators will guide patients utilizing problem- solving interventions to address and correct the stressor making their life unbalanced. Understanding and applying boundaries will be explored and addressed for obtaining  and maintaining a balanced life. Patients will be encouraged to explore ways to assertively make their unbalanced needs known to significant others in their lives, using other group members and facilitator for support and feedback.  Therapeutic Goals: 1. Patient will identify two or more emotions or situations they have that consume much of in their lives. 2. Patient will identify signs/triggers that life has become out of balance:  3. Patient will identify two ways to set boundaries in order to achieve balance in their lives:  4. Patient will demonstrate ability to communicate their needs through discussion and/or role plays  Summary of Patient Progress:          Therapeutic Modalities:   Cognitive Behavioral Therapy Solution-Focused Therapy Assertiveness Training  Greg Rejeana Fadness, LCSW 

## 2018-12-02 NOTE — Progress Notes (Signed)
  [facility] Adult Case Management Discharge Plan :  Will you be returning to the same living situation after discharge:  Yes,  with mother At discharge, do you have transportation home?: Yes,  mother Do you have the ability to pay for your medications: Yes,  medicaid  Release of information consent forms completed and in the chart;  Patient's signature needed at discharge.  Patient to Follow up at: Follow-up Information    [facility] Follow up on 12/08/2018.   Why:  Hospital follow up appointment is Wednesday, 4/15 at 9:00a.  At this time the appointment will be held over the telephone.  The provider will contact patient.  Contact information: [address] [place] [address] [phone]           Next level of care provider has access to [facility] Link:no  Safety Planning and Suicide Prevention discussed: Yes,  with mother  Have you used any form of tobacco in the last 30 days? (Cigarettes, Smokeless Tobacco, Cigars, and/or Pipes): No  Has patient been referred to the Quitline?: N/A patient is not a smoker  Patient has been referred for addiction treatment: N/A  Lorri Frederick, LCSW 12/02/2018, 3:25 PM

## 2018-12-02 NOTE — Progress Notes (Signed)
Patient ID: [name], female   DOB: 18-Nov-1985, 33 y.o.   MRN: [id number]  Pt did not attend afternoon group the [facility].

## 2018-12-02 NOTE — Progress Notes (Signed)
[facility] MD Progress Note  12/02/2018 9:38 AM [name]  MRN:  [id number] Subjective:    Patient is denying all symptoms on my interview denies current auditory and visual hallucinations staff reports that she still talking to herself when she believes she is unobserved, no EPS or TD probable discharge tomorrow after team discussion close to baseline but has long-acting injectable on board  Principal Problem: Exacerbation and underlying psychotic disorder Diagnosis: Active Problems:   Schizophrenia, paranoid type (HCC)  Total Time spent with patient: 20 minutes  Past Medical History:  Past Medical History:  Diagnosis Date  . Bipolar 1 disorder (HCC) 2011  . Paranoia (HCC)   . Schizoaffective disorder (HCC)    History reviewed. No pertinent surgical history. Family History:  Family History  Problem Relation Age of Onset  . Cancer Father        lymphoma  . Hypertension Mother   . Diabetes Neg Hx   . Heart disease Neg Hx    Social History:  Social History   Substance and Sexual Activity  Alcohol Use No   Comment: Pt denies      Social History   Substance and Sexual Activity  Drug Use No   Comment: Pt denies    Social History   Socioeconomic History  . Marital status: Single    Spouse name: Not on file  . Number of children: 0  . Years of education: some colle  . Highest education level: Not on file  Occupational History  . Occupation: [name]     Comment: trying to start   Social Needs  . Financial resource strain: Not on file  . Food insecurity:    Worry: Not on file    Inability: Not on file  . Transportation needs:    Medical: Not on file    Non-medical: Not on file  Tobacco Use  . Smoking status: Never Smoker  . Smokeless tobacco: Never Used  Substance and Sexual Activity  . Alcohol use: No    Comment: Pt denies   . Drug use: No    Comment: Pt denies  . Sexual activity: Not Currently    Birth control/protection: Abstinence, None  Lifestyle  .  Physical activity:    Days per week: Not on file    Minutes per session: Not on file  . Stress: Not on file  Relationships  . Social connections:    Talks on phone: Not on file    Gets together: Not on file    Attends religious service: Not on file    Active member of club or organization: Not on file    Attends meetings of clubs or organizations: Not on file    Relationship status: Not on file  Other Topics Concern  . Not on file  Social History Narrative   Work or School: not working   Home Situation: lives with mother   Spiritual Beliefs: Christian   Lifestyle: no regular exercise; healthy diet         Additional Social History:                         Sleep: Good  Appetite:  Good  Current Medications: Current Facility-Administered Medications  Medication Dose Route Frequency Provider Last Rate Last Dose  . acetaminophen (TYLENOL) tablet 650 mg  650 mg Oral Q6H PRN Charm Rings, NP      . alum & mag hydroxide-simeth (MAALOX/MYLANTA) 200-200-20 MG/5ML suspension 30  mL  30 mL Oral Q4H PRN Charm RingsLord, Jamison Y, NP      . benztropine (COGENTIN) tablet 1 mg  1 mg Oral BID Charm RingsLord, Jamison Y, NP   1 mg at 12/02/18 [id number]  . carbamazepine (TEGRETOL) tablet 200 mg  200 mg Oral BID PC Charm RingsLord, Jamison Y, NP   200 mg at 12/02/18 [id number]  . haloperidol (HALDOL) tablet 5 mg  5 mg Oral TID Malvin JohnsFarah, Loretha Ure, MD   5 mg at 12/02/18 [id number]  . hydrOXYzine (ATARAX/VISTARIL) tablet 50 mg  50 mg Oral Q6H PRN Charm RingsLord, Jamison Y, NP      . magnesium hydroxide (MILK OF MAGNESIA) suspension 30 mL  30 mL Oral Daily PRN Charm RingsLord, Jamison Y, NP      . traZODone (DESYREL) tablet 150 mg  150 mg Oral QHS PRN,MR X 1 Malvin JohnsFarah, Orville Mena, MD        Lab Results: No results found for this or any previous visit (from the past 48 hour(s)).  Blood Alcohol level:  Lab Results  Component Value Date   ETH <10 11/27/2018   ETH <10 05/11/2018    Metabolic Disorder Labs: Lab Results  Component Value Date   HGBA1C 5.5  11/02/2017   MPG 111.15 11/02/2017   No results found for: PROLACTIN Lab Results  Component Value Date   CHOL 179 07/27/2014   TRIG 49 07/27/2014   HDL 44 07/27/2014   CHOLHDL 5 06/21/2014   VLDL 10 07/27/2014   LDLCALC 125 (H) 07/27/2014   LDLCALC 100 (H) 06/21/2014    Physical Findings: AIMS: Facial and Oral Movements Muscles of Facial Expression: None, normal Lips and Perioral Area: None, normal Jaw: None, normal Tongue: None, normal,Extremity Movements Upper (arms, wrists, hands, fingers): None, normal Lower (legs, knees, ankles, toes): None, normal, Trunk Movements Neck, shoulders, hips: None, normal, Overall Severity Severity of abnormal movements (highest score from questions above): None, normal Incapacitation due to abnormal movements: None, normal Patient's awareness of abnormal movements (rate only patient's report): No Awareness, Dental Status Current problems with teeth and/or dentures?: No Does patient usually wear dentures?: No  CIWA:  CIWA-Ar Total: 0 COWS:  COWS Total Score: 1  Musculoskeletal: Strength & Muscle Tone: within normal limits Gait & Station: normal Psychiatric Specialty Exam: Physical Exam  ROS  Blood pressure 100/76, pulse 86, temperature 98.3 F (36.8 C), temperature source Oral, resp. rate 16, height 5\' 4"  (1.626 m), weight 60 kg.Body mass index is 22.71 kg/m.  General Appearance: Casual  Eye Contact:  Good  Speech:  Clear and Coherent  Volume:  Normal  Mood:  Euthymic  Affect:  Appropriate  Thought Process:  Goal Directed  Orientation:  Full (Time, Place, and Person)  Thought Content:  Hallucinations: Auditory  Suicidal Thoughts:  No  Homicidal Thoughts:  No  Memory:  3/3  Judgement:  Fair  Insight:  Fair  Psychomotor Activity:  Normal  Concentration:  Concentration: Fair  Recall:  Fair  Fund of Knowledge:  Fair  Language:  Fair  Akathisia:  Negative  Handed:  Right  AIMS (if indicated):     Assets:  Resilience Social  Support  ADL's:  Intact  Cognition:  WNL  Sleep:  Number of Hours: 6.5     Treatment Plan Summary: Daily contact with patient to assess and evaluate symptoms and progress in treatment, Medication management and Plan Placed on oral Haldol continue benztropine continue cognitive therapies no change otherwise probable discharge tomorrow  Malvin JohnsFARAH,Maxine Fredman, MD 12/02/2018, 9:38 AM

## 2018-12-02 NOTE — Progress Notes (Signed)
Recreation Therapy Notes  Date: 4.9.20 Time: 1000 Location: 500 Hall Dayroom  Group Topic: Communication, Team Building, Problem Solving  Goal Area(s) Addresses:  Patient will effectively work with peer towards shared goal.  Patient will identify skill used to make activity successful.  Patient will identify how skills used during activity can be used to reach post d/c goals.   Behavioral Response:  Engaged  Intervention: STEM Activity   Activity: In team's, using 20 small plastic cups, patients were asked to build the tallest free standing tower possible.    Education: Pharmacist, community, Building control surveyor.   Education Outcome: Acknowledges education/In group clarification offered/Needs additional education.   Clinical Observations/Feedback:  Pt was active and engaged.  Pt expressed it was hard to balance the cups.  Pt was hard to understand in most of her responses.  Pt talks very soft and sounds like pt is mumbling and has a hard time getting thoughts together.     Caroll Rancher, LRT/CTRS    Lillia Abed, Reynolds Kittel A 12/02/2018 11:13 AM

## 2018-12-03 MED ORDER — CARBAMAZEPINE 200 MG PO TABS: 200.0000 mg | ORAL_TABLET | Freq: Two times a day (BID) | ORAL | 2 refills | Status: DC

## 2018-12-03 MED ORDER — HALOPERIDOL DECANOATE 100 MG/ML IM SOLN: 100.0000 mg | INTRAMUSCULAR | 11 refills | Status: DC

## 2018-12-03 MED ORDER — HALOPERIDOL 10 MG PO TABS: 10.0000 mg | ORAL_TABLET | Freq: Every day | ORAL | 1 refills | Status: DC

## 2018-12-03 MED ORDER — BENZTROPINE MESYLATE 1 MG PO TABS: 1.0000 mg | ORAL_TABLET | Freq: Two times a day (BID) | ORAL | 1 refills | Status: DC

## 2018-12-03 NOTE — Progress Notes (Signed)
Patient ID: [name], female   DOB: 17-Aug-1986, 33 y.o.   MRN: [id number] Patient discharged to home/self care in the presence of her mother.  Patient was in a bright mood upon discharged and denies SI, HI and AVH.  Patient acknowledged understanding of all discharge instructions.

## 2018-12-03 NOTE — Plan of Care (Signed)
Pt attended and engaged in recreation therapy group sessions without prompting.   [name], LRT/CTRS 

## 2018-12-03 NOTE — Discharge Summary (Signed)
Physician Discharge Summary Note  Patient:  [name] is an 33 y.o., female MRN:  [id number] DOB:  06/18/1986 Patient phone:  [phone] (home)  Patient address[place]   [address] [place] [phone],  Total Time spent with patient: 15 minutes  Date of Admission:  11/29/2018 Date of Discharge: 12/03/18  Reason for Admission:  psychosis  Principal Problem: Schizophrenia, paranoid type [facility](HCC) Discharge Diagnoses: Principal Problem:   Schizophrenia, paranoid type (HCC)   Past Psychiatric History: Schizophrenia with numerous hospitalizations.  Past Medical History:  Past Medical History:  Diagnosis Date  . Bipolar 1 disorder (HCC) 2011  . Paranoia (HCC)   . Schizoaffective disorder (HCC)    History reviewed. No pertinent surgical history. Family History:  Family History  Problem Relation Age of Onset  . Cancer Father        lymphoma  . Hypertension Mother   . Diabetes Neg Hx   . Heart disease Neg Hx    Family Psychiatric  History: Denies Social History:  Social History   Substance and Sexual Activity  Alcohol Use No   Comment: Pt denies      Social History   Substance and Sexual Activity  Drug Use No   Comment: Pt denies    Social History   Socioeconomic History  . Marital status: Single    Spouse name: Not on file  . Number of children: 0  . Years of education: some colle  . Highest education level: Not on file  Occupational History  . Occupation: [employer]     Comment: trying to start   Social Needs  . Financial resource strain: Not on file  . Food insecurity:    Worry: Not on file    Inability: Not on file  . Transportation needs:    Medical: Not on file    Non-medical: Not on file  Tobacco Use  . Smoking status: Never Smoker  . Smokeless tobacco: Never Used  Substance and Sexual Activity  . Alcohol use: No    Comment: Pt denies   . Drug use: No    Comment: Pt denies  . Sexual activity: Not Currently    Birth control/protection:  Abstinence, None  Lifestyle  . Physical activity:    Days per week: Not on file    Minutes per session: Not on file  . Stress: Not on file  Relationships  . Social connections:    Talks on phone: Not on file    Gets together: Not on file    Attends religious service: Not on file    Active member of club or organization: Not on file    Attends meetings of clubs or organizations: Not on file    Relationship status: Not on file  Other Topics Concern  . Not on file  Social History Narrative   Work or School: not working   Home Situation: lives with mother   Spiritual Beliefs: Christian   Lifestyle: no regular exercise; healthy diet          Hospital Course:  From admission H&P 11/30/2018: This is the latest of multiple admissions and encounters for [name] 33 year old patient known to have a chronic psychotic disorder who has been admitted here previously, as recently as 6 months ago, as well as at other facilities where I have treated her as recently as 2016. Her presentations are generally the same she is noncompliant with her antipsychotics and deteriorates as far as her cognitive functioning and behavior.  She presented on 4/4 complaining of noncompliance for least 6 months and mother was concerned about her safety specifically fearing that she will wander in traffic in a disorganized state While in the emergency department since 4/4 (transferred to our floor in 4/6) she was noted to have hallucinations, intermittent disorientation as to place situation so forth, responding to stimuli, and displaying disorganized thought and behavior. The present time she is little more coherent she is oriented to the fact she is hospitalized she does not know the day date or time states she is here "because of a misunderstanding[place]" and begins to ramble in this type fashion saying nothing particular meaningful or related to her situation. Whispering and mumbling to  herself as well so possibly still hallucinating. Denied drug use. Denied head trauma.  Ms. Tollett was admitted for psychotic symptoms. She had been noncompliant with her antipsychotic medication. She received Haldol Decanoate 100 mg in the ED on 11/28/2018. Oral Haldol was continued, with Cogentin and Tegretol. She responded well to treatment with no adverse effects reported. She remained on the [facility] unit for 3 days. The patient stabilized on medication and therapy. Patient was discharged on the medications listed below. Patient has shown improvement with improved mood, affect, sleep, appetite, and interaction. She denies any SI/HI/AVH and contracts for safety. Patient agrees to follow up at [facility] (see below). Patient is provided with prescriptions for medications upon discharge. Her mother is picking her up for discharge home.  Physical Findings: AIMS: Facial and Oral Movements Muscles of Facial Expression: None, normal Lips and Perioral Area: None, normal Jaw: None, normal Tongue: None, normal,Extremity Movements Upper (arms, wrists, hands, fingers): None, normal Lower (legs, knees, ankles, toes): None, normal, Trunk Movements Neck, shoulders, hips: None, normal, Overall Severity Severity of abnormal movements (highest score from questions above): None, normal Incapacitation due to abnormal movements: None, normal Patient's awareness of abnormal movements (rate only patient's report): No Awareness, Dental Status Current problems with teeth and/or dentures?: No Does patient usually wear dentures?: No  CIWA:  CIWA-Ar Total: 0 COWS:  COWS Total Score: 1  Musculoskeletal: Strength & Muscle Tone: within normal limits Gait & Station: normal Patient leans: N/A  Psychiatric Specialty Exam: Physical Exam  Nursing note and vitals reviewed. Constitutional: She is oriented to person, place, and time. She appears well-developed and well-nourished.  Cardiovascular: Normal rate.  Respiratory: Effort  normal.  Neurological: She is alert and oriented to person, place, and time.    Review of Systems  Constitutional: Negative.   Psychiatric/Behavioral: Negative for depression, hallucinations, memory loss, substance abuse and suicidal ideas. The patient is not nervous/anxious and does not have insomnia.     Blood pressure 107/75, pulse 81, temperature 98.1 F (36.7 C), temperature source Oral, resp. rate 18, height  (1.626 m), weight 60 kg.Body mass index is 22.71 kg/m.  General Appearance: Casual  Eye Contact:  Fair  Speech:  Normal Rate  Volume:  Normal  Mood:  Euthymic  Affect:  Congruent  Thought Process:  Coherent  Orientation:  Full (Time, Place, and Person)  Thought Content:  WDL  Suicidal Thoughts:  No  Homicidal Thoughts:  No  Memory:  Immediate;   Fair  Judgement:  Intact  Insight:  Fair  Psychomotor Activity:  Normal  Concentration:  Concentration: Fair  Recall:  [employer] of Knowledge:  Fair  Language:  Good  Akathisia:  No  Handed:  Right  AIMS (if indicated):  Assets:  Housing Resilience Social Support  ADL's:  Intact  Cognition:  WNL  Sleep:  Number of Hours: 6.75     Have you used any form of tobacco in the last 30 days? (Cigarettes, Smokeless Tobacco, Cigars, and/or Pipes): No  Has this patient used any form of tobacco in the last 30 days? (Cigarettes, Smokeless Tobacco, Cigars, and/or Pipes)  No  Blood Alcohol level:  Lab Results  Component Value Date   ETH <10 11/27/2018   ETH <10 05/11/2018    Metabolic Disorder Labs:  Lab Results  Component Value Date   HGBA1C 5.5 11/02/2017   MPG 111.15 11/02/2017   No results found for: PROLACTIN Lab Results  Component Value Date   CHOL 179 07/27/2014   TRIG 49 07/27/2014   HDL 44 07/27/2014   CHOLHDL 5 06/21/2014   VLDL 10 07/27/2014   LDLCALC 125 (H) 07/27/2014   LDLCALC 100 (H) 06/21/2014    See Psychiatric Specialty Exam and Suicide Risk Assessment completed by Attending Physician  prior to discharge.  Discharge destination:  Home  Is patient on multiple antipsychotic therapies at discharge:  No   Has Patient had three or more failed trials of antipsychotic monotherapy by history:  No  Recommended Plan for Multiple Antipsychotic Therapies: NA   Allergies as of 12/03/2018      Reactions   Quetiapine Other (See Comments)   Facial skin irritation   Yes For Women [intimacy Products] Rash   Zyprexa [olanzapine] Rash      Medication List    STOP taking these medications   hydrOXYzine 50 MG tablet Commonly known as:  ATARAX/VISTARIL   traZODone 50 MG tablet Commonly known as:  DESYREL     TAKE these medications     Indication  benztropine 1 MG tablet Commonly known as:  COGENTIN Take 1 tablet (1 mg total) by mouth 2 (two) times daily.  Indication:  Extrapyramidal Reaction caused by Medications   carbamazepine 200 MG tablet Commonly known as:  TEGRETOL Take 1 tablet (200 mg total) by mouth 2 (two) times daily after a meal.  Indication:  Manic-Depression   haloperidol 10 MG tablet Commonly known as:  HALDOL Take 1 tablet (10 mg total) by mouth at bedtime. What changed:    medication strength  how much to take  when to take this  Indication:  Psychosis   haloperidol decanoate 100 MG/ML injection Commonly known as:  HALDOL DECANOATE Inject 1 mL (100 mg total) into the muscle every 28 (twenty-eight) days. Next due 12/28/18  Indication:  Schizophrenia      Follow-up Information    [facility] Follow up on 12/08/2018.   Why:  Hospital follow up appointment is Wednesday, 4/15 at 9:00a.  At this time the appointment will be held over the telephone.  The provider will contact patient.  Contact information: [address] [place] [address] [phone]           Follow-up recommendations: Activity as tolerated. Diet as recommended by primary care physician. Keep all scheduled follow-up appointments as recommended.   Comments:    Patient is instructed to take all prescribed medications as recommended. Report any side effects or adverse reactions to your outpatient psychiatrist. Patient is instructed to abstain from alcohol and illegal drugs while on prescription medications. In the event of worsening symptoms, patient is instructed to call the crisis hotline, 911, or go to the nearest emergency department for evaluation and treatment.  Signed: Aldean Baker, NP 12/03/2018, 1:23  PM

## 2018-12-03 NOTE — Progress Notes (Signed)
Nursing Progress Note: 7p-7a D: Pt currently presents with a anxiety/paranoia/blocking/circumstantial affect and behavior. Pt states "I don't want any meds." Interacting suspiciously with the milieu. Pt reports good sleep during the previous night with current medication regimen.   A: Pt refused medications per providers orders. Pt's labs and vitals were monitored throughout the night. Pt supported emotionally and encouraged to express concerns and questions. Pt educated on medications.  R: Pt's safety ensured with 15 minute and environmental checks. Pt currently denies SI, HI, and AVH, but observed talking to herself in hall and room. Pt verbally contracts to seek staff if SI,HI, or AVH occurs and to consult with staff before acting on any harmful thoughts. Will continue to monitor.

## 2018-12-03 NOTE — Progress Notes (Signed)
Recreation Therapy Notes  Date: 4.10.20 Time: 1000 Location: 500 Hall Dayroom  Group Topic: Communication  Goal Area(s) Addresses:  Patient will effectively communicate with staff in group.  Patient will identify benefits of effective communication.  Intervention:  Discussion  Activity: LRT had a discussion with patient about their day and what they wanted to accomplish over the course of the day.  Education: Communication, Discharge Planning  Education Outcome: Acknowledges understanding/In group clarification offered/Needs additional education.   Clinical Observations/Feedback: Pt did not attend group.    Jaquise Faux, LRT/CTRS         Octavis Sheeler A 12/03/2018 11:59 AM 

## 2018-12-03 NOTE — [facility] Suicide Risk Assessment (Signed)
[facility] Discharge Suicide Risk Assessment   Principal Problem: Exacerbation and underlying schizophrenic condition Discharge Diagnoses: Active Problems:   Schizophrenia, paranoid type (HCC)   Total Time spent with patient: 45 minutes Alert oriented without acute psychosis no thoughts of harming self or others contracting fully no EPS or TD compliant with meds  Mental Status Per Nursing Assessment::   On Admission:  NA  Demographic Factors:  Low socioeconomic status  Loss Factors: Decrease in vocational status  Historical Factors: NA  Risk Reduction Factors:   Religious beliefs about death  Continued Clinical Symptoms:  Schizophrenia:   Paranoid or undifferentiated type  Cognitive Features That Contribute To Risk:  Loss of executive function    Suicide Risk:  Minimal: No identifiable suicidal ideation.  Patients presenting with no risk factors but with morbid ruminations; may be classified as minimal risk based on the severity of the depressive symptoms  Follow-up Information    [facility] Follow up on 12/08/2018.   Why:  Hospital follow up appointment is Wednesday, 4/15 at 9:00a.  At this time the appointment will be held over the telephone.  The provider will contact patient.  Contact information: [address] [place] [address] [phone]           Plan Of Care/Follow-up recommendations:  Activity:  full  Teron Blais, MD 12/03/2018, 8:59 AM

## 2018-12-03 NOTE — Progress Notes (Signed)
  [facility] Adult Case Management Discharge Plan :  Will you be returning to the same living situation after discharge:  Yes,  home At discharge, do you have transportation home?: Yes,  mom is picking up Do you have the ability to pay for your medications: Yes,  Med[employer]  Release of information consent forms completed and in the chart Patient to Follow up at: Follow-up Information    [facility] Follow up on 12/08/2018.   Why:  Hospital follow up appointment is Wednesday, 4/15 at 9:00a.  At this time the appointment will be held over the telephone.  The provider will contact patient.  Contact information: [address] [place] [address] [phone]           Next level of care provider has access to [facility] Link:no  Safety Planning and Suicide Prevention discussed: Yes,  with mother  Have you used any form of tobacco in the last 30 days? (Cigarettes, Smokeless Tobacco, Cigars, and/or Pipes): No  Has patient been referred to the Quitline?: Patient refused referral  Patient has been referred for addiction treatment: Yes  Darreld Mclean, LCSWA 12/03/2018, 9:42 AM

## 2018-12-03 NOTE — Progress Notes (Signed)
Recreation Therapy Notes  INPATIENT RECREATION TR PLAN  Patient Details Name: [name] MRN: [id number] DOB: 1986-06-10 Today's Date: 12/03/2018  Rec Therapy Plan Is patient appropriate for Therapeutic Recreation?: Yes Treatment times per week: about 3 days Estimated Length of Stay: 5-7 days TR Treatment/Interventions: Group participation (Comment)  Discharge Criteria Pt will be discharged from therapy if:: Discharged Treatment plan/goals/alternatives discussed and agreed upon by:: Patient/family  Discharge Summary Short term goals set: See patient care plan Short term goals met: Complete Progress toward goals comments: Groups attended Which groups?: Coping skills, Other (Comment)(Team building, Anxiety) Reason goals not met: None Therapeutic equipment acquired: N/A Reason patient discharged from therapy: Discharge from hospital Pt/family agrees with progress & goals achieved: Yes Date patient discharged from therapy: 12/03/18    Victorino Sparrow, LRT/CTRS  Ria Comment, Francoise Chojnowski A 12/03/2018, 12:54 PM

## 2018-12-03 NOTE — Progress Notes (Signed)
The focus of this group is to help patients establish daily goals to achieve during treatment and discuss how the patient can incorporate goal setting into their daily lives to aide in recovery.Also her is to discharge.

## 2019-04-11 ENCOUNTER — Ambulatory Visit: Payer: Medicaid Other | Admitting: Obstetrics and Gynecology

## 2019-04-12 ENCOUNTER — Ambulatory Visit: Payer: Medicaid Other | Admitting: Obstetrics and Gynecology

## 2019-05-05 ENCOUNTER — Ambulatory Visit: Payer: Medicaid Other | Admitting: Nurse Practitioner

## 2019-06-15 ENCOUNTER — Ambulatory Visit: Payer: Medicaid Other | Admitting: Nurse Practitioner

## 2019-06-20 ENCOUNTER — Ambulatory Visit (HOSPITAL_COMMUNITY)
Admission: EM | Admit: 2019-06-20 | Discharge: 2019-06-20 | Disposition: A | Discharge status: Home / Self Care | Payer: Medicaid Other

## 2019-06-20 ENCOUNTER — Other Ambulatory Visit: Payer: Self-pay

## 2019-09-13 ENCOUNTER — Other Ambulatory Visit: Payer: Self-pay

## 2019-09-13 ENCOUNTER — Ambulatory Visit (INDEPENDENT_AMBULATORY_CARE_PROVIDER_SITE_OTHER): Payer: Medicaid Other | Admitting: Obstetrics and Gynecology

## 2019-09-13 ENCOUNTER — Encounter: Payer: Self-pay | Admitting: Obstetrics and Gynecology

## 2019-09-13 VITALS — Wt 143.7 lb

## 2019-09-13 DIAGNOSIS — Z01419 Encounter for gynecological examination (general) (routine) without abnormal findings: Secondary | ICD-10-CM

## 2019-09-13 DIAGNOSIS — Z0000 Encounter for general adult medical examination without abnormal findings: Secondary | ICD-10-CM | POA: Diagnosis not present

## 2019-09-13 NOTE — Progress Notes (Signed)
Subjective:     [name] is a 34 y.o. female with LMP 09/09/19 who is here for a comprehensive physical exam. The patient reports no problems. Patient is not sexually active. She is not currently on any contraception and is not interested in contraception at this time. Patient reports a monthly 5 day cycle. She denies any pelvic pain or abnormal discharge. Patient suffers from schizophrenia and is not taking her medications. She exhibits tangential thoughts and conversations.  Past Medical History:  Diagnosis Date  . Bipolar 1 disorder ([place]) 2011  . Paranoia ([place])   . Schizoaffective disorder ([place])    No past surgical history on file. Family History  Problem Relation Age of Onset  . Cancer Father        lymphoma  . Hypertension Mother   . Diabetes Neg Hx   . Heart disease Neg Hx     Social History   Socioeconomic History  . Marital status: Single    Spouse name: Not on file  . Number of children: 0  . Years of education: some colle  . Highest education level: Not on file  Occupational History  . Occupation: [employer]     Comment: trying to start   Tobacco Use  . Smoking status: Never Smoker  . Smokeless tobacco: Never Used  Substance and Sexual Activity  . Alcohol use: No    Comment: Pt denies   . Drug use: No    Comment: Pt denies  . Sexual activity: Not Currently    Birth control/protection: Abstinence, None  Other Topics Concern  . Not on file  Social History Narrative   Work or School: not working   Home Situation: lives with mother   Spiritual Beliefs: Christian   Lifestyle: no regular exercise; healthy diet         Social Determinants of Health   Financial Resource Strain:   . Difficulty of Paying Living Expenses: Not on file  Food Insecurity:   . Worried About Charity fundraiser in the Last Year: Not on file  . Ran Out of Food in the Last Year: Not on file  Transportation Needs:   . Lack of Transportation (Medical): Not on file  . Lack of  Transportation (Non-Medical): Not on file  Physical Activity:   . Days of Exercise per Week: Not on file  . Minutes of Exercise per Session: Not on file  Stress:   . Feeling of Stress : Not on file  Social Connections:   . Frequency of Communication with Friends and Family: Not on file  . Frequency of Social Gatherings with Friends and Family: Not on file  . Attends Religious Services: Not on file  . Active Member of Clubs or Organizations: Not on file  . Attends Archivist Meetings: Not on file  . Marital Status: Not on file  Intimate Partner Violence:   . Fear of Current or Ex-Partner: Not on file  . Emotionally Abused: Not on file  . Physically Abused: Not on file  . Sexually Abused: Not on file   Health Maintenance  Topic Date Due  . INFLUENZA VACCINE  03/26/2019  . PAP SMEAR-Modifier  05/22/2019  . TETANUS/TDAP  06/21/2024  . HIV Screening  Completed       Review of Systems Pertinent items noted in HPI and remainder of comprehensive ROS otherwise negative.   Objective:     Patient declined vitals and physical exam   Assessment:  female exam.      Plan:    Patient opted to return for physical exam with pap smear following her menses  See After Visit Summary for Counseling Recommendations

## 2019-09-13 NOTE — Progress Notes (Signed)
New Patient in office, pt reports LMP 09-09-19 and states she is not taking any medications.

## 2019-10-03 ENCOUNTER — Ambulatory Visit: Payer: Medicaid Other | Admitting: Obstetrics and Gynecology

## 2019-10-18 ENCOUNTER — Ambulatory Visit: Payer: Medicaid Other | Admitting: Obstetrics and Gynecology

## 2019-11-07 ENCOUNTER — Ambulatory Visit: Payer: Medicaid Other | Admitting: Obstetrics and Gynecology

## 2019-12-05 ENCOUNTER — Ambulatory Visit: Payer: Self-pay

## 2019-12-05 ENCOUNTER — Other Ambulatory Visit: Payer: Self-pay

## 2019-12-05 VITALS — BP 110/75 | HR 90 | Wt 141.0 lb

## 2019-12-05 DIAGNOSIS — Z1239 Encounter for other screening for malignant neoplasm of breast: Secondary | ICD-10-CM

## 2019-12-05 DIAGNOSIS — Z124 Encounter for screening for malignant neoplasm of cervix: Secondary | ICD-10-CM

## 2019-12-05 DIAGNOSIS — Z01419 Encounter for gynecological examination (general) (routine) without abnormal findings: Secondary | ICD-10-CM

## 2019-12-05 NOTE — Progress Notes (Deleted)
GYN presents for AEX/PAP/STD check.  Reports no problems today. 

## 2019-12-07 ENCOUNTER — Other Ambulatory Visit: Payer: Self-pay

## 2019-12-07 ENCOUNTER — Encounter (HOSPITAL_COMMUNITY): Payer: Self-pay

## 2019-12-07 ENCOUNTER — Emergency Department (HOSPITAL_COMMUNITY)
Admission: EM | Admit: 2019-12-07 | Discharge: 2019-12-07 | Disposition: A | Discharge status: Home / Self Care | Payer: Medicaid Other | Attending: Emergency Medicine | Admitting: Emergency Medicine

## 2019-12-07 DIAGNOSIS — Z79899 Other long term (current) drug therapy: Secondary | ICD-10-CM | POA: Insufficient documentation

## 2019-12-07 DIAGNOSIS — F200 Paranoid schizophrenia: Secondary | ICD-10-CM

## 2019-12-07 DIAGNOSIS — F201 Disorganized schizophrenia: Secondary | ICD-10-CM | POA: Diagnosis not present

## 2019-12-07 DIAGNOSIS — Z046 Encounter for general psychiatric examination, requested by authority: Secondary | ICD-10-CM | POA: Diagnosis present

## 2019-12-07 MED ORDER — HALOPERIDOL LACTATE 5 MG/ML IJ SOLN: 5.0000 mg | Freq: Once | INTRAMUSCULAR | Status: DC

## 2019-12-07 NOTE — ED Provider Notes (Signed)
[facility]-EMERGENCY DEPT Provider Note   CSN: [id number] Arrival date & time: 12/07/19  1022     History Chief Complaint  Patient presents with  . Psychiatric Evaluation    [name] is a 34 y.o. female.  HPI    Level 5 caveat for psychiatric breakdown.  34 year old female comes in with chief complaint of psych evaluation. Patient has been involuntarily committed by [employer]. Allegedly there was a phone call from her household yesterday, and when PD arrived patient was found in the parking lot being combative. The patient was threatening to walk away from the household and there was concern that since she was psychiatrically unstable, she might end up in an accident. PD actually initiated IVC paperwork but when they went back to the home patient was not to be found. They went to check on her again today and brought her to the ER.  Patient is noted to be disorganized and agitated. She does not know why she was brought to the emergency room.  Past Medical History:  Diagnosis Date  . Bipolar 1 disorder (H[facility]) 2011  . Paranoia (HCC)   . Schizoaffective disorder [facility])     Patient Active Problem List   Diagnosis Date Noted  . Schizophrenia, paranoid type (HCC) 07/14/2017  . Somatic dysfunction of pelvic region 10/26/2014  . Paranoia (HCC)   . Schizoaffective disorder, bipolar type (HCC) 03/11/2012    History reviewed. No pertinent surgical history.   OB History   No obstetric history on file.     Family History  Problem Relation Age of Onset  . Cancer Father        lymphoma  . Hypertension Mother   . Diabetes Neg Hx   . Heart disease Neg Hx     Social History   Tobacco Use  . Smoking status: Never Smoker  . Smokeless tobacco: Never Used  Substance Use Topics  . Alcohol use: No    Comment: Pt denies   . Drug use: No    Comment: Pt denies    Home Medications Prior to Admission medications   Medication Sig Start Date End Date Taking?  Authorizing Provider  benztropine (COGENTIN) 1 MG tablet Take 1 tablet (1 mg total) by mouth 2 (two) times daily. Patient not taking: Reported on 09/13/2019 12/03/18   Malvin Johns, MD  carbamazepine (TEGRETOL) 200 MG tablet Take 1 tablet (200 mg total) by mouth 2 (two) times daily after a meal. Patient not taking: Reported on 09/13/2019 12/03/18   Malvin Johns, MD  haloperidol (HALDOL) 10 MG tablet Take 1 tablet (10 mg total) by mouth at bedtime. Patient not taking: Reported on 09/13/2019 12/03/18   Malvin Johns, MD  haloperidol decanoate (HALDOL DECANOATE) 100 MG/ML injection Inject 1 mL (100 mg total) into the muscle every 28 (twenty-eight) days. Next due 12/28/18 Patient not taking: Reported on 09/13/2019 12/03/18   Malvin Johns, MD    Allergies    Quetiapine, Yes for women [intimacy products], and Zyprexa [olanzapine]  Review of Systems   Review of Systems  Unable to perform ROS: Psychiatric disorder    Physical Exam Updated Vital Signs BP (!) 148/92 (BP Location: Left Arm)   Pulse 92   Resp 20   Ht 5\' 3"  (1.6 m)   Wt 62.4 kg   LMP 11/06/2019   SpO2 99%   BMI 24.36 kg/m   Physical Exam Vitals and nursing note reviewed.  Constitutional:      Appearance: She is well-developed.  HENT:     Head: Atraumatic.  Cardiovascular:     Rate and Rhythm: Normal rate.  Pulmonary:     Effort: Pulmonary effort is normal.  Abdominal:     General: Bowel sounds are normal.  Musculoskeletal:     Cervical back: Normal range of motion and neck supple.  Skin:    General: Skin is warm and dry.  Neurological:     Mental Status: She is alert and oriented to person, place, and time.  Psychiatric:     Comments: Disorganized, tangential thinking, paranoid     ED Results / Procedures / Treatments   Labs (all labs ordered are listed, but only abnormal results are displayed) Labs Reviewed  I-STAT BETA HCG BLOOD, ED ([facility], [facility], [facility] ONLY)    EKG None  Radiology No results  found.  Procedures Procedures (including critical care time)  Medications Ordered in ED Medications - No data to display  ED Course  I have reviewed the triage vital signs and the nursing notes.  Pertinent labs & imaging results that were available during my care of the patient were reviewed by me and considered in my medical decision making (see chart for details).  Clinical Course as of Dec 07 1438  Wed Dec 07, 2019  1439 Psych team has assessed the patient. They to did not see any imminent danger for patient. They have called patient's mother and received collaborative history that has led to a recommendation of discharging the patient. IVC will be rescinded. I reassessed the patient. She continues to have flight of ideas and paranoid, but she is directable and she has complied thus far with her work-up.  PD will take patient back home.   [AN]    Clinical Course User Index [AN] Varney Biles, MD   MDM Rules/Calculators/A&P                      34 year old with history of paranoid schizophrenia comes in to the ER with chief complaint of inappropriate behavior. Currently patient is not posing any signs of danger to self or others. It appears that she had perhaps threatened to walk away from her house yesterday and PD was concerned that patient might end up get into an accident.  Chart reviewed. Patient was just discharged from the hospital few days ago. It appears that at baseline she is disorganized and she is noncompliant with her meds. Patient admits to being noncompliant with her meds even now. She is oriented to self, time and location. She does appear paranoid and has flights of ideas -however does not appear that she is at imminent danger to self or anyone else.  I have completed the first exam and requested TTS to evaluate.  Final Clinical Impression(s) / ED Diagnoses Final diagnoses:  Paranoid schizophrenia ([place])    Rx / DC Orders ED Discharge Orders    None        Varney Biles, MD 12/07/19 1440

## 2019-12-07 NOTE — Consult Note (Signed)
  [name], 34 y.o., female patient seen via tele psych by this provider, consulted Dr. Lucianne Muss; and chart reviewed on 12/07/19.  On evaluation [name] denies suicidal/self-harm/homicidal ideation, psychosis, and paranoia.    During evaluation [name] is alert/oriented to self and place she is some what calm during assessment; voice is loud and she is easily irritated. She is disorganized but at her baseline.  Patient does not appear to be responding to delusional thoughts or internal/external stimuli.  Patient has outpatient psychiatric services at [facility].   TTS spoke to patients mother for collateral information and was informed that patient left last night and did not come back home until today; mother was concerned; called the police but patient returned home on her own.  Mother stats that she does not feel that patient needs psychiatric hospitalization and the her current state is baseline and feel that patient can return home.     Disposition:  Patient psychiatrically cleared to follow up with [facility] No evidence of imminent risk to self or others at present.   Patient does not meet criteria for psychiatric inpatient admission. Supportive therapy provided about ongoing stressors. Discussed crisis plan, support from social network, calling 911, coming to the Emergency Department, and calling Suicide Hotline.    Discharge Instructions     For your behavioral health needs, you are advised to continue treatment with [facility]:       [facility]      [address]      [place], [place] [address]      [phone]      Crisis number: [phone]     Spoke with Dr. [name]; informed of disposition

## 2019-12-07 NOTE — ED Triage Notes (Signed)
Patient had IVC papers since yesterday. Patient had erratic behavior in her apartment parking lot. [place] sheriff's went to serve papers yesterday and patient was not to be found. Patient was found today and IVC papers served. Patient arrived yelling, cursing and talking about her family, etc. Security and [employer] remain outside of the patient's room in triage #4.

## 2019-12-07 NOTE — Discharge Instructions (Addendum)
For your behavioral health needs, you are advised to continue treatment with [facility]: ° °     [facility] °     [address] °     [place], [place] [address] °     [phone] °     Crisis number: [phone] ° °

## 2019-12-07 NOTE — ED Notes (Signed)
Pt discharged home. Discharged instructions read to pt who verbalized understanding. All belongings returned to pt who signed for same. Denies SI/HI. Escorted pt to the ED exit.   Patient at her baseline. Discharged to her mother.

## 2019-12-07 NOTE — ED Notes (Signed)
Pt is disorganized, [employer] present. Will not go to her room. Not aggressive at this time, but will not redirect.

## 2019-12-07 NOTE — ED Notes (Signed)
Called [employer]. [employer] will be transporting her back home.

## 2019-12-07 NOTE — BH Assessment (Signed)
Assessment Note  [name] is an 34 y.o. female who presented to the ED on IVC petitioned by the [employer] Team of the Police Department because she left home yesterday and did not let her mother know where she was going.  Mother called the police and a Silver Alert was almost issued.  Because patient's baseline is disorganized and she is non-compliant with her medication, the Behavioral Health response Team initiated the IVC. Patient was just discharged from the [facility] earlier this month and has evidently not been compliant with her medications. However, patient is not suicidal, homicidal and denies that she is experiencing AVH.  She states that she has no history of any drug use.  Patient states that she is sleeping five hours per night and states that her appetite has been good.  TTS contacted patient's mother, Rosemond Lyttle [phone], for collateral information.  She admits that patient is a little disorganized, but she does not feel like patient is a danger to herself or anyone else.  Mother states that she tried to tell the police that patient did not need to be admitted to the hospital, but she states that they would not listen to her.  Mother states that she is okay with patient coming back home.  Mother states, "if she gets really manic again and I cannot manage her at home, I will bring her back to the Emergency Room."  As stated earlier, patient is somewhat disorganized, but she is not actively hallucinating.  She is oriented and alert and her memory is intact.  At baseline, her judgment is partially impaired, her insight is limited and her impulse control is poor.  Her affect is blunted and flat, but she does not appear to be depressed.  Her speech is pressured, but her eye contact is good.  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 Bipolar 1 disorder ([place]) 2011  . Paranoia ([place])   . Schizoaffective disorder  ([place])     History reviewed. No pertinent surgical history.  Family History:  Family History  Problem Relation Age of Onset  . Cancer Father        lymphoma  . Hypertension Mother   . Diabetes Neg Hx   . Heart disease Neg Hx     Social History:  reports that she has never smoked. She has never used smokeless tobacco. She reports that she does not drink alcohol or use drugs.  Additional Social History:  Alcohol / Drug Use Pain Medications: see MAR Prescriptions: see MAR Over the Counter: see MAR History of alcohol / drug use?: No history of alcohol / drug abuse Longest period of sobriety (when/how long): NA  CIWA: CIWA-Ar BP: (!) 148/92 Pulse Rate: 92 COWS:    Allergies:  Allergies  Allergen Reactions  . Quetiapine Other (See Comments)    Facial skin irritation  . Yes For Women [Intimacy Products] Rash  . Zyprexa [Olanzapine] Rash    Home Medications: (Not in a hospital admission)   OB/GYN Status:  Patient's last menstrual period was 11/06/2019.  General Assessment Data [facility] of Assessment: [facility] TTS Assessment: In system Is this a Tele or Face-to-Face Assessment?: Face-to-Face Is this an Initial Assessment or a Re-assessment for this encounter?: Initial Assessment Patient Accompanied by:: Other(ACTT Team) Language Other than English: No Living Arrangements: Other (Comment)(lives with her mother) What gender do you identify as?: Female Marital status: Single Maiden name: Ciulla Pregnancy Status: No  Living Arrangements: Parent Can pt return to current living arrangement?: Yes Admission Status: Involuntary Petitioner: Other(ACTT Team) Is patient capable of signing voluntary admission?: No Referral Source: Other(ACTT Team) Insurance type: Medicaid     Crisis Care Plan Living Arrangements: Parent Legal Guardian: Other:(self) Name of Psychiatrist: Vesta Mixer Name of Therapist: [facility]  Education Status Is patient currently in school?: No Is the patient  employed, unemployed or receiving disability?: Unemployed, Receiving disability income  Risk to self with the past 6 months Suicidal Ideation: No Has patient been a risk to self within the past 6 months prior to admission? : No Suicidal Intent: No Has patient had any suicidal intent within the past 6 months prior to admission? : No Is patient at risk for suicide?: No Suicidal Plan?: No Has patient had any suicidal plan within the past 6 months prior to admission? : No Access to Means: No What has been your use of drugs/alcohol within the last 12 months?: none Previous Attempts/Gestures: No How many times?: 0 Other Self Harm Risks: psychosis Triggers for Past Attempts: None known Intentional Self Injurious Behavior: None Family Suicide History: No Recent stressful life event(s): Other (Comment)(none reported) Persecutory voices/beliefs?: No Depression: No Substance abuse history and/or treatment for substance abuse?: No Suicide prevention information given to non-admitted patients: Not applicable  Risk to Others within the past 6 months Homicidal Ideation: No Does patient have any lifetime risk of violence toward others beyond the six months prior to admission? : No Thoughts of Harm to Others: No Current Homicidal Intent: No Current Homicidal Plan: No Access to Homicidal Means: No Identified Victim: none History of harm to others?: No Assessment of Violence: None Noted Violent Behavior Description: none Does 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 to weapons?: No Criminal Charges Pending?: No Does patient have a court date: No Is patient on probation?: No  Psychosis Hallucinations: None noted Delusions: None noted  Mental Status Report Appearance/Hygiene: Disheveled Eye Contact: Good Motor Activity: Freedom of movement Speech: Pressured Level of Consciousness: Alert Mood: Anxious Affect: Flat Anxiety Level: Moderate Thought Processes: Flight of Ideas Judgement: Partial Orientation:  Person, Place, Time, Situation Obsessive Compulsive Thoughts/Behaviors: None  Cognitive Functioning Concentration: Decreased Memory: Recent Intact, Remote Intact Is patient IDD: No Insight: Fair Impulse Control: Fair Appetite: Good Have you had any weight changes? : No Change Sleep: No Change Total Hours of Sleep: 5 Vegetative Symptoms: Decreased grooming  ADLScreening [facility] Assessment Services) Patient's cognitive ability adequate to safely complete daily activities?: Yes Patient able to express need for assistance with ADLs?: Yes Independently performs ADLs?: Yes (appropriate for developmental age)  Prior Inpatient Therapy Prior Inpatient Therapy: Yes Prior Therapy Dates: 11/2018 Prior Therapy Facilty/Provider(s): [facility] Reason for Treatment: schizophrenia  Prior Outpatient Therapy Prior Outpatient Therapy: Yes Prior Therapy Dates: last year Prior Therapy Facilty/Provider(s): [facility] Reason for Treatment: schizophrenia Does patient have an ACCT team?: No Does patient have Intensive In-House Services?  : No Does patient have [facility] services? : No Does patient have P4CC services?: No  ADL Screening (condition at time of admission) Patient's cognitive ability adequate to safely complete daily activities?: Yes Patient able to express need for assistance with ADLs?: Yes Independently performs ADLs?: Yes (appropriate for developmental age)       Abuse/Neglect Assessment (Assessment to be complete while patient is alone) Abuse/Neglect Assessment Can Be Completed: Yes Physical Abuse: Denies Verbal Abuse: Denies Sexual Abuse: Denies Exploitation of patient/patient's resources: Denies Self-Neglect: Denies Values / Beliefs Cultural Requests During Hospitalization: None Spiritual Requests During Hospitalization: None Consults Spiritual  Care Consult Needed: No Transition of Care Team Consult Needed: No Advance Directives (For Healthcare) Does Patient Have a Medical Advance  Directive?: No Would patient like information on creating a medical advance directive?: No - Patient declined Nutrition Screen- [facility] Has the patient recently lost weight without trying?: No Has the patient been eating poorly because of a decreased appetite?: No Malnutrition Screening Tool Score: 0        Disposition:  Per Shuvon Rankin, NP, patient can be discharged home to F/U with [facility] Disposition Initial Assessment Completed for this Encounter: Yes Disposition of Patient: Discharge Patient refused recommended treatment: No Mode of transportation if patient is discharged/movement?: Car Patient referred to: Outpatient clinic referral  On Site Evaluation by:   Reviewed with Physician:    Arnoldo Lenis Tison Leibold 12/07/2019 2:04 PM

## 2019-12-08 NOTE — Progress Notes (Signed)
Patient left prior to being seen.   Cherre Robins MSN, CNM Advanced Practice Provider, [facility] [employer]

## 2020-01-26 ENCOUNTER — Emergency Department (HOSPITAL_COMMUNITY)
Admission: EM | Admit: 2020-01-26 | Discharge: 2020-01-29 | Disposition: A | Discharge status: Home / Self Care | Payer: Medicaid Other | Attending: Emergency Medicine | Admitting: Emergency Medicine

## 2020-01-26 ENCOUNTER — Other Ambulatory Visit: Payer: Self-pay

## 2020-01-26 DIAGNOSIS — F29 Unspecified psychosis not due to a substance or known physiological condition: Secondary | ICD-10-CM

## 2020-01-26 DIAGNOSIS — Z20822 Contact with and (suspected) exposure to covid-19: Secondary | ICD-10-CM | POA: Insufficient documentation

## 2020-01-26 DIAGNOSIS — F3010 Manic episode without psychotic symptoms, unspecified: Secondary | ICD-10-CM

## 2020-01-26 DIAGNOSIS — F250 Schizoaffective disorder, bipolar type: Secondary | ICD-10-CM | POA: Insufficient documentation

## 2020-01-26 LAB — COMPREHENSIVE METABOLIC PANEL
ALT: 19 U/L (ref 0–44)
AST: 20 U/L (ref 15–41)
Albumin: 4.4 g/dL (ref 3.5–5.0)
Alkaline Phosphatase: 47 U/L (ref 38–126)
Anion gap: 11 (ref 5–15)
BUN: 13 mg/dL (ref 6–20)
CO2: 23 mmol/L (ref 22–32)
Calcium: 9.2 mg/dL (ref 8.9–10.3)
Chloride: 107 mmol/L (ref 98–111)
Creatinine, Ser: 0.9 mg/dL (ref 0.44–1.00)
GFR calc Af Amer: >60 mL/min (ref >60)
GFR calc non Af Amer: >60 mL/min (ref >60)
Glucose, Bld: 94 mg/dL (ref 70–99)
Potassium: 4.1 mmol/L (ref 3.5–5.1)
Sodium: 141 mmol/L (ref 135–145)
Total Bilirubin: 0.9 mg/dL (ref 0.3–1.2)
Total Protein: 7.6 g/dL (ref 6.5–8.1)

## 2020-01-26 LAB — CBC WITH DIFFERENTIAL/PLATELET
Abs Immature Granulocytes: 0 10*3/uL (ref 0.00–0.07)
Basophils Absolute: 0.1 10*3/uL (ref 0.0–0.1)
Basophils Relative: 1 %
Eosinophils Absolute: 0 10*3/uL (ref 0.0–0.5)
Eosinophils Relative: 1 %
HCT: 38.3 % (ref 36.0–46.0)
Hemoglobin: 12.4 g/dL (ref 12.0–15.0)
Immature Granulocytes: 0 %
Lymphocytes Relative: 29 %
Lymphs Abs: 1.5 10*3/uL (ref 0.7–4.0)
MCH: 27 pg (ref 26.0–34.0)
MCHC: 32.4 g/dL (ref 30.0–36.0)
MCV: 83.3 fL (ref 80.0–100.0)
Monocytes Absolute: 0.4 10*3/uL (ref 0.1–1.0)
Monocytes Relative: 7 %
Neutro Abs: 3.3 10*3/uL (ref 1.7–7.7)
Neutrophils Relative %: 62 %
Platelets: 239 10*3/uL (ref 150–400)
RBC: 4.6 MIL/uL (ref 3.87–5.11)
RDW: 13.7 % (ref 11.5–15.5)
WBC: 5.3 10*3/uL (ref 4.0–10.5)
nRBC: 0 % (ref 0.0–0.2)

## 2020-01-26 LAB — ETHANOL: Alcohol, Ethyl (B): <10 mg/dL (ref <10)

## 2020-01-26 LAB — I-STAT BETA HCG BLOOD, ED (MC, WL, AP ONLY): I-stat hCG, quantitative: <5 m[IU]/mL (ref <5)

## 2020-01-26 LAB — SARS CORONAVIRUS 2 BY RT PCR (HOSPITAL ORDER, PERFORMED IN ~~LOC~~ HOSPITAL LAB): SARS Coronavirus 2: NEGATIVE

## 2020-01-26 MED ORDER — LORAZEPAM 1 MG PO TABS
1.0000 mg | ORAL_TABLET | Freq: Once | ORAL | Status: AC
  Filled 2020-01-26: qty 1

## 2020-01-26 MED ORDER — LORAZEPAM 2 MG/ML IJ SOLN
1.0000 mg | Freq: Once | INTRAMUSCULAR | Status: AC
  Administered 2020-01-26: 1 mg via INTRAMUSCULAR
  Filled 2020-01-26: qty 1

## 2020-01-26 NOTE — ED Notes (Signed)
Pt has been unwilling to change into scrubs.  She is non-compliant and very loud with pressured speech.  Social worker with [employer] is deescalating patient and trying to get her to change.

## 2020-01-26 NOTE — ED Triage Notes (Signed)
Pt here with [employer] with complaints of manic behavior.  Her mother talked her into coming to the ER to "Talk to Somone"  Pt is manic and uncooperative

## 2020-01-26 NOTE — Progress Notes (Addendum)
Received [name] this PM at the change of shift awake in her room with her mom at the bedside. She remained calm after her mom left, received a snack of her choice and drifted off to sleep. The sitter remained at the beside throughout the night.

## 2020-01-26 NOTE — ED Provider Notes (Signed)
[facility] DEPT Provider Note   CSN: [id number] Arrival date & time: 01/26/20  1314     History Chief Complaint  Patient presents with  . Manic Behavior    [name] is a 34 y.o. female.  Presents to ER with concern for manic behavior.  Patient came on request of mother.  Reports that patient having increased mania.  Patient does not provide any answers to medical or psychiatric questions.  Rambling, unable to provide concrete history.  Level 5 caveat history limited due to current psychiatric state.  Past history of bipolar and schizoaffective  HPI     Past Medical History:  Diagnosis Date  . Bipolar 1 disorder ([place]) 2011  . Paranoia ([place])   . Schizoaffective disorder [facility])     Patient Active Problem List   Diagnosis Date Noted  . Schizophrenia, paranoid type ([place]) 07/14/2017  . Somatic dysfunction of pelvic region 10/26/2014  . Paranoia ([place])   . Schizoaffective disorder, bipolar type ([place]) 03/11/2012    No past surgical history on file.   OB History   No obstetric history on file.     Family History  Problem Relation Age of Onset  . Cancer Father        lymphoma  . Hypertension Mother   . Diabetes Neg Hx   . Heart disease Neg Hx     Social History   Tobacco Use  . Smoking status: Never Smoker  . Smokeless tobacco: Never Used  Substance Use Topics  . Alcohol use: No    Comment: Pt denies   . Drug use: No    Comment: Pt denies    Home Medications Prior to Admission medications   Medication Sig Start Date End Date Taking? Authorizing Provider  Multiple Vitamin (MULTIVITAMIN ADULT) TABS Take 1 tablet by mouth daily.   Yes [provider]  OVER THE COUNTER MEDICATION Take 1 tablet by mouth daily. Calm   Yes [provider]  benztropine (COGENTIN) 1 MG tablet Take 1 tablet (1 mg total) by mouth 2 (two) times daily. Patient not taking: Reported on 09/13/2019 12/03/18   Johnn Hai, MD  carbamazepine  (TEGRETOL) 200 MG tablet Take 1 tablet (200 mg total) by mouth 2 (two) times daily after a meal. Patient not taking: Reported on 09/13/2019 12/03/18   Johnn Hai, MD  haloperidol (HALDOL) 10 MG tablet Take 1 tablet (10 mg total) by mouth at bedtime. Patient not taking: Reported on 09/13/2019 12/03/18   Johnn Hai, MD  haloperidol decanoate (HALDOL DECANOATE) 100 MG/ML injection Inject 1 mL (100 mg total) into the muscle every 28 (twenty-eight) days. Next due 12/28/18 Patient not taking: Reported on 09/13/2019 12/03/18   Johnn Hai, MD    Allergies    Quetiapine, Yes for women [intimacy products], and Zyprexa [olanzapine]  Review of Systems   Review of Systems  Unable to perform ROS: Psychiatric disorder    Physical Exam Updated Vital Signs BP 118/82 (BP Location: Left Arm)   Pulse 97   Temp 100 F (37.8 C) (Oral)   Resp 19   SpO2 100%   Physical Exam Vitals and nursing note reviewed.  Constitutional:      General: She is not in acute distress.    Appearance: She is well-developed.  HENT:     Head: Normocephalic and atraumatic.  Eyes:     Conjunctiva/sclera: Conjunctivae normal.  Cardiovascular:     Rate and Rhythm: Normal rate and regular rhythm.  Heart sounds: No murmur.  Pulmonary:     Effort: Pulmonary effort is normal. No respiratory distress.     Breath sounds: Normal breath sounds.  Abdominal:     Palpations: Abdomen is soft.     Tenderness: There is no abdominal tenderness.  Musculoskeletal:        General: No deformity or signs of injury.     Cervical back: Neck supple.  Skin:    General: Skin is warm and dry.     Capillary Refill: Capillary refill takes less than 2 seconds.  Neurological:     General: No focal deficit present.     Mental Status: She is alert. Mental status is at baseline.  Psychiatric:     Comments: Responding to internal stimuli, pressure speech, responding to hallucinations     ED Results / Procedures / Treatments   Labs (all  labs ordered are listed, but only abnormal results are displayed) Labs Reviewed  SARS CORONAVIRUS 2 BY RT PCR (HOSPITAL ORDER, PERFORMED IN [facility])  COMPREHENSIVE METABOLIC PANEL  ETHANOL  RAPID URINE DRUG SCREEN, HOSP PERFORMED  CBC WITH DIFFERENTIAL/PLATELET  URINALYSIS, ROUTINE W REFLEX MICROSCOPIC  I-STAT BETA HCG BLOOD, ED ([facility], [facility], [facility] ONLY)    EKG EKG Interpretation  Date/Time:  Thursday January 26 2020 15:38:08 EDT Ventricular Rate:  92 PR Interval:  164 QRS Duration: 80 QT Interval:  338 QTC Calculation: 417 R Axis:   54 Text Interpretation: Normal sinus rhythm Nonspecific T wave abnormality Abnormal ECG No significant change since last tracing Confirmed by Jacalyn Lefevre [phone]) on 01/26/2020 3:46:11 PM   Radiology No results found.  Procedures Procedures (including critical care time)  Medications Ordered in ED Medications  LORazepam (ATIVAN) tablet 1 mg ( Oral See Alternative 01/26/20 1423)    Or  LORazepam (ATIVAN) injection 1 mg (1 mg Intramuscular Given 01/26/20 1423)    ED Course  I have reviewed the triage vital signs and the nursing notes.  Pertinent labs & imaging results that were available during my care of the patient were reviewed by me and considered in my medical decision making (see chart for details).    MDM Rules/Calculators/A&P                      34 year old lady with past medical history of bipolar, schizoaffective disorder presenting to ER with concern for increased mania.  Patient in room is rambling, demonstrating pressured speech, responding to internal stimuli.  Does not respond to medical or psychiatric questioning.  Clinically concern for acute mania, psychosis.  Patient was unwilling to receive medication, labs, psych consult.  Given her very poor psychiatric state, concern that she is a threat to herself.  Completed IVC paperwork.  Will obtain labs, TTS consult.  Signed out to Drain while awaiting labs.   Final  Clinical Impression(s) / ED Diagnoses Final diagnoses:  Manic behavior (HCC)  Psychosis, unspecified psychosis type [facility])    Rx / DC Orders ED Discharge Orders    None       Milagros Loll, MD 01/26/20 [phone]

## 2020-01-27 LAB — URINALYSIS, ROUTINE W REFLEX MICROSCOPIC
Bilirubin Urine: NEGATIVE
Glucose, UA: NEGATIVE mg/dL
Hgb urine dipstick: NEGATIVE
Ketones, ur: NEGATIVE mg/dL
Leukocytes,Ua: NEGATIVE
Nitrite: NEGATIVE
Protein, ur: NEGATIVE mg/dL
Specific Gravity, Urine: 1.012 (ref 1.005–1.030)
pH: 7 (ref 5.0–8.0)

## 2020-01-27 LAB — RAPID URINE DRUG SCREEN, HOSP PERFORMED
Amphetamines: NOT DETECTED
Barbiturates: NOT DETECTED
Benzodiazepines: NOT DETECTED
Cocaine: NOT DETECTED
Opiates: NOT DETECTED
Tetrahydrocannabinol: NOT DETECTED

## 2020-01-27 MED ORDER — HALOPERIDOL 5 MG PO TABS
5.0000 mg | ORAL_TABLET | Freq: Two times a day (BID) | ORAL | Status: DC
  Administered 2020-01-28 – 2020-01-29 (×2): 5 mg via ORAL
  Filled 2020-01-27 (×3): qty 1

## 2020-01-27 MED ORDER — CARBAMAZEPINE 200 MG PO TABS
200.0000 mg | ORAL_TABLET | Freq: Two times a day (BID) | ORAL | Status: DC
  Administered 2020-01-28 – 2020-01-29 (×2): 200 mg via ORAL
  Filled 2020-01-27 (×3): qty 1

## 2020-01-27 MED ORDER — BENZTROPINE MESYLATE 1 MG/ML IJ SOLN
0.5000 mg | Freq: Two times a day (BID) | INTRAMUSCULAR | Status: DC
  Administered 2020-01-27: 0.5 mg via INTRAMUSCULAR
  Filled 2020-01-27: qty 2

## 2020-01-27 MED ORDER — BENZTROPINE MESYLATE 0.5 MG PO TABS
0.5000 mg | ORAL_TABLET | Freq: Two times a day (BID) | ORAL | Status: DC
  Administered 2020-01-28 – 2020-01-29 (×2): 0.5 mg via ORAL
  Filled 2020-01-27 (×3): qty 1

## 2020-01-27 MED ORDER — HALOPERIDOL LACTATE 5 MG/ML IJ SOLN
5.0000 mg | Freq: Two times a day (BID) | INTRAMUSCULAR | Status: DC
  Administered 2020-01-27: 5 mg via INTRAMUSCULAR
  Filled 2020-01-27 (×3): qty 1

## 2020-01-27 NOTE — ED Notes (Signed)
Pt yelling, rambling, irritated, pt redirected.

## 2020-01-27 NOTE — BH Assessment (Signed)
Assessment Note  [name] is an 34 y.o. female that presents this date with concern for manic behavior. Patient presented due to mother's request with patient reporting ongoing mania. Patient is noted this date to be hyper verbal and speech is pressured. Patient is speaking about unrelated topics as this Probation officer attempts to gather history. Patient is difficult to redirect. Patient denies any S/I, H/I although when asked in reference to [place] stated "I will have to think about that." Patient was last seen on 12/07/19 when she presented with IVC due to patient leaving her mother's home and was consider missing. Mother called the police and a Silver Alert was almost issued at that time. This date patient is observed to be disorganized and she has been non-compliant with her medications. Patient has been receiving services from [facility] who assists with medication management although this writer is uncertain when patient last met with that provider. She states that she has no history of any drug use. Patient states that she is sleeping five to seven hours per night and states that her appetite has been good.  TTS attempted to contact her mother [name] [phone]) for collateral information unsuccessfully this date.  Patient is somewhat disorganized and it is unclear if patient is currently responding to internal stimuli. Patient denies any prior attempts or gestures at self harm. She is oriented and alert and her memory is intact. At baseline, her judgment is partially impaired, her insight is limited and her impulse control is poor. Her affect is blunted and flat, but she does not appear to be depressed. Her speech is pressured, but her eye contact is good. Dwyane Dee MD, Hall Busing NP also evaluated patient and recommended a inpatient admission to assist with stabilization. Patient is voluntary at this time.   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 Bipolar 1 disorder ([place]) 2011  . Paranoia ([place])   . Schizoaffective disorder ([place])     No past surgical history on file.  Family History:  Family History  Problem Relation Age of Onset  . Cancer Father        lymphoma  . Hypertension Mother   . Diabetes Neg Hx   . Heart disease Neg Hx     Social History:  reports that she has never smoked. She has never used smokeless tobacco. She reports that she does not drink alcohol or use drugs.  Additional Social History:  Alcohol / Drug Use Pain Medications: See MAR Prescriptions: See MAR Over the Counter: See MAR History of alcohol / drug use?: No history of alcohol / drug abuse  CIWA: CIWA-Ar BP: 103/64 Pulse Rate: (!) 58 COWS:    Allergies:  Allergies  Allergen Reactions  . Quetiapine Other (See Comments)    Facial skin irritation  . Yes For Women [Intimacy Products] Rash  . Zyprexa [Olanzapine] Rash    Home Medications: (Not in a hospital admission)   OB/GYN Status:  No LMP recorded.  General Assessment Data [facility] of Assessment: [facility] TTS Assessment: In system Is this a Tele or Face-to-Face Assessment?: Face-to-Face Is this an Initial Assessment or a Re-assessment for this encounter?: Initial Assessment Patient Accompanied by:: N/A Language Other than English: No Living Arrangements: Other (Comment)(Lives with mother) What gender do you identify as?: Female Marital status: Single Maiden name: Debellis Pregnancy Status: No Living Arrangements: Parent Can pt return to current living arrangement?: Yes Admission Status: Voluntary Is patient capable  of signing voluntary admission?: No Referral Source: Self/Family/Friend Insurance type: Medicaid     Crisis Care Plan Living Arrangements: Parent Legal Guardian: Other:(Other self) Name of Psychiatrist: Beverly Sessions Name of Therapist: [facility]  Education Status Is patient currently in school?: No Is the patient employed, unemployed or receiving  disability?: Receiving disability income  Risk to self with the past 6 months Suicidal Ideation: No Has patient been a risk to self within the past 6 months prior to admission? : No Suicidal Intent: No Has patient had any suicidal intent within the past 6 months prior to admission? : No Is patient at risk for suicide?: No Suicidal Plan?: No Has patient had any suicidal plan within the past 6 months prior to admission? : No Access to Means: No What has been your use of drugs/alcohol within the last 12 months?: NA Previous Attempts/Gestures: No How many times?: 0 Other Self Harm Risks: (Ongoing MH issues) Triggers for Past Attempts: (NA) Intentional Self Injurious Behavior: None Family Suicide History: No Recent stressful life event(s): Other (Comment)(none reported) Persecutory voices/beliefs?: No Depression: No Depression Symptoms: (Pt denies) Substance abuse history and/or treatment for substance abuse?: No Suicide prevention information given to non-admitted patients: Not applicable  Risk to Others within the past 6 months Homicidal Ideation: No Does patient have any lifetime risk of violence toward others beyond the six months prior to admission? : No Thoughts of Harm to Others: No Current Homicidal Intent: No Current Homicidal Plan: No Access to Homicidal Means: No Identified Victim: None History of harm to others?: No Assessment of Violence: None Noted Violent Behavior Description: None Does 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 to weapons?: No Criminal Charges Pending?: No Does patient have a court date: No Is patient on probation?: No  Psychosis Hallucinations: None noted Delusions: None noted  Mental Status Report Appearance/Hygiene: Unremarkable Eye Contact: Good Motor Activity: Freedom of movement Speech: Pressured Level of Consciousness: Alert Mood: Anxious Affect: Appropriate to circumstance Anxiety Level: Moderate Thought Processes: Flight of Ideas Judgement:  Partial Orientation: Person, Place, Time Obsessive Compulsive Thoughts/Behaviors: None  Cognitive Functioning Concentration: Decreased Memory: Recent Intact, Remote Intact Is patient IDD: No Insight: Fair Impulse Control: Fair Appetite: Good Have you had any weight changes? : No Change Sleep: No Change Total Hours of Sleep: 6 Vegetative Symptoms: None  ADLScreening [facility] Assessment Services) Patient's cognitive ability adequate to safely complete daily activities?: Yes Patient able to express need for assistance with ADLs?: Yes Independently performs ADLs?: Yes (appropriate for developmental age)  Prior Inpatient Therapy Prior Inpatient Therapy: Yes Prior Therapy Dates: 2020, 2019 Prior Therapy Facilty/Provider(s): [facility] Reason for Treatment: MH issues  Prior Outpatient Therapy Prior Outpatient Therapy: Yes Prior Therapy Dates: 2020 Prior Therapy Facilty/Provider(s): [facility] Reason for Treatment: Med mang Does patient have an ACCT team?: No Does patient have Intensive In-House Services?  : No Does patient have [facility] services? : No Does patient have P4CC services?: No  ADL Screening (condition at time of admission) Patient's cognitive ability adequate to safely complete daily activities?: Yes Is the patient deaf or have difficulty hearing?: No Does the patient have difficulty seeing, even when wearing glasses/contacts?: No Does the patient have difficulty concentrating, remembering, or making decisions?: No Patient able to express need for assistance with ADLs?: Yes Does the patient have difficulty dressing or bathing?: No Independently performs ADLs?: Yes (appropriate for developmental age) Does the patient have difficulty walking or climbing stairs?: No Weakness of Legs: None Weakness of Arms/Hands: None  Home Assistive Devices/Equipment Home Assistive Devices/Equipment: None  Therapy Consults (therapy consults require a physician order) PT Evaluation Needed:  No OT Evalulation Needed: No SLP Evaluation Needed: No Abuse/Neglect Assessment (Assessment to be complete while patient is alone) Physical Abuse: Denies Verbal Abuse: Denies Sexual Abuse: Denies Exploitation of patient/patient's resources: Denies Self-Neglect: Denies Values / Beliefs Cultural Requests During Hospitalization: None Spiritual Requests During Hospitalization: None Consults Spiritual Care Consult Needed: No Transition of Care Team Consult Needed: No Advance Directives (For Healthcare) Does Patient Have a Medical Advance Directive?: No Would patient like information on creating a medical advance directive?: No - Patient declined          Disposition:  Dwyane Dee MD, Hall Busing NP also evaluated patient and recommended a inpatient admission to assist with stabilization. Patient is voluntary at this time.   Disposition Initial Assessment Completed for this Encounter: Yes Disposition of Patient: Admit Type of inpatient treatment program: Adult  On Site Evaluation by:   Reviewed with Physician:    Mamie Nick 01/27/2020 12:31 PM

## 2020-01-27 NOTE — Progress Notes (Signed)
Received [name] at the change of shift awake in her room with the sitter at the bedside. She remains disorganized, but redirectable. She refused her PO medications, therefore she was given an IM dose of medication per order. She is very psychotic and out of control at the present time. Security assisted with the administration of her IM medication. She refused to allow her VS to be monitored. She eventually drifted off to sleep after 12 mn. She requested to speak with a chaplain, she was given a bible. She woke up this morning at 0600 hrs responding to internal stimuli, pacing in her room, angry and slamming the  bedroom door.

## 2020-01-27 NOTE — ED Notes (Signed)
Notified EDP that pt complaining of "ear infection"  And "ear feels different".

## 2020-01-27 NOTE — BH Assessment (Signed)
[facility] Assessment Progress Note  Lucianne Muss MD, Arlana Pouch NP also evaluated patient and recommended a inpatient admission to assist with stabilization. Patient is voluntary at this time.

## 2020-01-27 NOTE — ED Notes (Signed)
Pt alert. Pt rambling . Pt uncooperative. Pt asking about going home, redirected.  Pt labile, incongruent.

## 2020-01-27 NOTE — Consult Note (Signed)
[facility] Psych ED Progress Note  01/27/2020 4:04 PM [name]  MRN:  [id number] Subjective: Patient states "I am doing okay, I am doing all right." Patient assessed by nurse practitioner along with Dr. Lucianne Muss. Patient alert and oriented, answers appropriately. Patient participates in assessment with vague responses.  Patient appears to be hyperverbal with pressured and tangential speech. Patient denies suicidal and homicidal ideations.  Patient denies auditory and visual hallucinations.  Patient denies paranoia. Patient reports she lives in [place] with her mother.  Patient denies access to weapons.  Patient denies alcohol and substance use.   Patient unable to discuss current outpatient psychiatric provider, unsure of current psychiatric medications.   Principal Problem: Schizoaffective disorder, bipolar type (HCC) Diagnosis:  Principal Problem:   Schizoaffective disorder, bipolar type (HCC)  Total Time spent with patient: 30 minutes  Past Psychiatric History: Schizoaffective disorder, bipolar type, schizophrenia, paranoid type  Past Medical History:  Past Medical History:  Diagnosis Date  . Bipolar 1 disorder (HCC) 2011  . Paranoia (HCC)   . Schizoaffective disorder (HCC)    No past surgical history on file. Family History:  Family History  Problem Relation Age of Onset  . Cancer Father        lymphoma  . Hypertension Mother   . Diabetes Neg Hx   . Heart disease Neg Hx    Family Psychiatric  History: None reported  Social History:  Social History   Substance and Sexual Activity  Alcohol Use No   Comment: Pt denies      Social History   Substance and Sexual Activity  Drug Use No   Comment: Pt denies    Social History   Socioeconomic History  . Marital status: Single    Spouse name: Not on file  . Number of children: 0  . Years of education: some colle  . Highest education level: Not on file  Occupational History  . Occupation: [name]     Comment: trying  to start   Tobacco Use  . Smoking status: Never Smoker  . Smokeless tobacco: Never Used  Substance and Sexual Activity  . Alcohol use: No    Comment: Pt denies   . Drug use: No    Comment: Pt denies  . Sexual activity: Not Currently    Birth control/protection: Abstinence, None  Other Topics Concern  . Not on file  Social History Narrative   Work or School: not working   Home Situation: lives with mother   Spiritual Beliefs: Christian   Lifestyle: no regular exercise; healthy diet         Social Determinants of Health   Financial Resource Strain:   . Difficulty of Paying Living Expenses:   Food Insecurity:   . Worried About Programme researcher, broadcasting/film/video in the Last Year:   . Barista in the Last Year:   Transportation Needs:   . Freight forwarder (Medical):   Marland Kitchen Lack of Transportation (Non-Medical):   Physical Activity:   . Days of Exercise per Week:   . Minutes of Exercise per Session:   Stress:   . Feeling of Stress :   Social Connections:   . Frequency of Communication with Friends and Family:   . Frequency of Social Gatherings with Friends and Family:   . Attends Religious Services:   . Active Member of Clubs or Organizations:   . Attends Banker Meetings:   Marland Kitchen Marital Status:     Sleep: Good  Appetite:  Good  Current Medications: Current Facility-Administered Medications  Medication Dose Route Frequency Provider Last Rate Last Admin  . benztropine (COGENTIN) tablet 0.5 mg  0.5 mg Oral BID Emmaline Kluver, FNP       Or  . benztropine mesylate (COGENTIN) injection 0.5 mg  0.5 mg Intramuscular BID Emmaline Kluver, FNP      . carbamazepine (TEGRETOL) tablet 200 mg  200 mg Oral BID Letitia Libra L, FNP      . haloperidol (HALDOL) tablet 5 mg  5 mg Oral BID Emmaline Kluver, FNP       Or  . haloperidol lactate (HALDOL) injection 5 mg  5 mg Intramuscular BID Emmaline Kluver, FNP       Current Outpatient Medications  Medication Sig Dispense Refill  . Multiple  Vitamin (MULTIVITAMIN ADULT) TABS Take 1 tablet by mouth daily.    Marland Kitchen OVER THE COUNTER MEDICATION Take 1 tablet by mouth daily. Calm    . benztropine (COGENTIN) 1 MG tablet Take 1 tablet (1 mg total) by mouth 2 (two) times daily. (Patient not taking: Reported on 09/13/2019) 60 tablet 1  . carbamazepine (TEGRETOL) 200 MG tablet Take 1 tablet (200 mg total) by mouth 2 (two) times daily after a meal. (Patient not taking: Reported on 09/13/2019) 60 tablet 2  . haloperidol (HALDOL) 10 MG tablet Take 1 tablet (10 mg total) by mouth at bedtime. (Patient not taking: Reported on 09/13/2019) 90 tablet 1  . haloperidol decanoate (HALDOL DECANOATE) 100 MG/ML injection Inject 1 mL (100 mg total) into the muscle every 28 (twenty-eight) days. Next due 12/28/18 (Patient not taking: Reported on 09/13/2019) 1 mL 11    Lab Results:  Results for orders placed or performed during the hospital encounter of 01/26/20 (from the past 48 hour(s))  SARS Coronavirus 2 by RT PCR (hospital order, performed in [facility] hospital lab) Nasopharyngeal Nasopharyngeal Swab     Status: None   Collection Time: 01/26/20  3:26 PM   Specimen: Nasopharyngeal Swab  Result Value Ref Range   SARS Coronavirus 2 NEGATIVE NEGATIVE    Comment: (NOTE) SARS-CoV-2 target nucleic acids are NOT DETECTED. The SARS-CoV-2 RNA is generally detectable in upper and lower respiratory specimens during the acute phase of infection. The lowest concentration of SARS-CoV-2 viral copies this assay can detect is 250 copies / mL. A negative result does not preclude SARS-CoV-2 infection and should not be used as the sole basis for treatment or other patient management decisions.  A negative result may occur with improper specimen collection / handling, submission of specimen other than nasopharyngeal swab, presence of viral mutation(s) within the areas targeted by this assay, and inadequate number of viral copies (<250 copies / mL). A negative result must be  combined with clinical observations, patient history, and epidemiological information. Fact Sheet for Patients:   StrictlyIdeas.no Fact Sheet for Healthcare Providers: BankingDealers.co.za This test is not yet approved or cleared  by the [place] FDA and has been authorized for detection and/or diagnosis of SARS-CoV-2 by FDA under an Emergency Use Authorization (EUA).  This EUA will remain in effect (meaning this test can be used) for the duration of the COVID-19 declaration under Section 564(b)(1) of the Act, 21 U.S.C. section 360bbb-3(b)(1), unless the authorization is terminated or revoked sooner. Performed at [facility], [place] [address], [place], [place] [address]   Comprehensive metabolic panel     Status: None   Collection Time: 01/26/20  3:30 PM  Result Value Ref Range   Sodium 141 135 - 145 mmol/L   Potassium 4.1 3.5 - 5.1 mmol/L   Chloride 107 98 - 111 mmol/L   CO2 23 22 - 32 mmol/L   Glucose, Bld 94 70 - 99 mg/dL    Comment: Glucose reference range applies only to samples taken after fasting for at least 8 hours.   BUN 13 6 - 20 mg/dL   Creatinine, Ser 4.69 0.44 - 1.00 mg/dL   Calcium 9.2 8.9 - 62.9 mg/dL   Total Protein 7.6 6.5 - 8.1 g/dL   Albumin 4.4 3.5 - 5.0 g/dL   AST 20 15 - 41 U/L   ALT 19 0 - 44 U/L   Alkaline Phosphatase 47 38 - 126 U/L   Total Bilirubin 0.9 0.3 - 1.2 mg/dL   GFR calc non Af Amer >60 >60 mL/min   GFR calc Af Amer >60 >60 mL/min   Anion gap 11 5 - 15    Comment: Performed at [facility], [address], [place], [place] [address]  Ethanol     Status: None   Collection Time: 01/26/20  3:30 PM  Result Value Ref Range   Alcohol, Ethyl (B) <10 <10 mg/dL    Comment: (NOTE) Lowest detectable limit for serum alcohol is 10 mg/dL. For medical purposes only. Performed at [facility], [address], [place], [place] [address]   CBC  with Diff     Status: None   Collection Time: 01/26/20  3:30 PM  Result Value Ref Range   WBC 5.3 4.0 - 10.5 K/uL   RBC 4.60 3.87 - 5.11 MIL/uL   Hemoglobin 12.4 12.0 - 15.0 g/dL   HCT 10.2 72.5 - 36.6 %   MCV 83.3 80.0 - 100.0 fL   MCH 27.0 26.0 - 34.0 pg   MCHC 32.4 30.0 - 36.0 g/dL   RDW 44.0 34.7 - 42.5 %   Platelets 239 150 - 400 K/uL   nRBC 0.0 0.0 - 0.2 %   Neutrophils Relative % 62 %   Neutro Abs 3.3 1.7 - 7.7 K/uL   Lymphocytes Relative 29 %   Lymphs Abs 1.5 0.7 - 4.0 K/uL   Monocytes Relative 7 %   Monocytes Absolute 0.4 0.1 - 1.0 K/uL   Eosinophils Relative 1 %   Eosinophils Absolute 0.0 0.0 - 0.5 K/uL   Basophils Relative 1 %   Basophils Absolute 0.1 0.0 - 0.1 K/uL   Immature Granulocytes 0 %   Abs Immature Granulocytes 0.00 0.00 - 0.07 K/uL    Comment: Performed at [facility], [address], [place], [place] [address]  I-Stat beta hCG blood, ED     Status: None   Collection Time: 01/26/20  3:41 PM  Result Value Ref Range   I-stat hCG, quantitative <5.0 <5 mIU/mL   Comment 3            Comment:   GEST. AGE      CONC.  (mIU/mL)   <=1 WEEK        5 - 50     2 WEEKS       50 - 500     3 WEEKS       100 - 10,000     4 WEEKS     1,000 - 30,000        FEMALE AND NON-PREGNANT FEMALE:     LESS THAN 5 mIU/mL   Urine rapid drug  screen (hosp performed)     Status: None   Collection Time: 01/27/20 10:16 AM  Result Value Ref Range   Opiates NONE DETECTED NONE DETECTED   Cocaine NONE DETECTED NONE DETECTED   Benzodiazepines NONE DETECTED NONE DETECTED   Amphetamines NONE DETECTED NONE DETECTED   Tetrahydrocannabinol NONE DETECTED NONE DETECTED   Barbiturates NONE DETECTED NONE DETECTED    Comment: (NOTE) DRUG SCREEN FOR MEDICAL PURPOSES ONLY.  IF CONFIRMATION IS NEEDED FOR ANY PURPOSE, NOTIFY LAB WITHIN 5 DAYS. LOWEST DETECTABLE LIMITS FOR URINE DRUG SCREEN Drug Class                     Cutoff (ng/mL) Amphetamine and metabolites     1000 Barbiturate and metabolites    200 Benzodiazepine                 200 Tricyclics and metabolites     300 Opiates and metabolites        300 Cocaine and metabolites        300 THC                            50 Performed at [facility], [address] [place], [place] [address]   Urinalysis, Routine w reflex microscopic     Status: None   Collection Time: 01/27/20 10:16 AM  Result Value Ref Range   Color, Urine YELLOW YELLOW   APPearance CLEAR CLEAR   Specific Gravity, Urine 1.012 1.005 - 1.030   pH 7.0 5.0 - 8.0   Glucose, UA NEGATIVE NEGATIVE mg/dL   Hgb urine dipstick NEGATIVE NEGATIVE   Bilirubin Urine NEGATIVE NEGATIVE   Ketones, ur NEGATIVE NEGATIVE mg/dL   Protein, ur NEGATIVE NEGATIVE mg/dL   Nitrite NEGATIVE NEGATIVE   Leukocytes,Ua NEGATIVE NEGATIVE    Comment: Performed at [facility], [address] [place], [place] [address]    Blood Alcohol level:  Lab Results  Component Value Date   ETH <10 01/26/2020   ETH <10 11/27/2018    Physical Findings: AIMS:  , ,  ,  ,    CIWA:    COWS:     Musculoskeletal: Strength & Muscle Tone: within normal limits Gait & Station: normal Patient leans: N/A  Psychiatric Specialty Exam: Physical Exam Vitals and nursing note reviewed.  Constitutional:      Appearance: She is well-developed.  HENT:     Head: Normocephalic.  Cardiovascular:     Rate and Rhythm: Normal rate.  Pulmonary:     Effort: Pulmonary effort is normal.  Neurological:     Mental Status: She is alert and oriented to person, place, and time.  Psychiatric:        Attention and Perception: Attention normal.        Mood and Affect: Mood is anxious.        Speech: Speech is rapid and pressured.        Behavior: Behavior is cooperative.     Review of Systems  Blood pressure 117/73, pulse 86, temperature 98.7 F (37.1 C), temperature source Oral, resp. rate 20, SpO2 95 %.There is no height or weight on  file to calculate BMI.  General Appearance: Casual  Eye Contact:  Good  Speech:  Pressured  Volume:  Normal  Mood:  Euthymic  Affect:  Non-Congruent  Thought Process:  Disorganized and Descriptions of Associations: Loose  Orientation:  Full (Time, Place, and  Person)  Thought Content:  Tangential  Suicidal Thoughts:  No  Homicidal Thoughts:  No  Memory:  Immediate;   Fair  Judgement:  Impaired  Insight:  Lacking  Psychomotor Activity:  Normal  Concentration:  Concentration: Poor and Attention Span: Poor  Recall:  FiservFair  Fund of Knowledge:  Fair  Language:  Fair  Akathisia:  No  Handed:  Right  AIMS (if indicated):     Assets:  Communication Skills Desire for Improvement Financial Resources/Insurance Housing Intimacy Leisure Time Physical Health Resilience Social Support  ADL's:  Intact  Cognition:  WNL  Sleep:         Treatment Plan Summary: Plan Inpatient psychiatric treatment recommended.  Patient currently involuntarily committed.  Home medications initiated.   Patrcia Dollyina L Tate, FNP 01/27/2020, 4:04 PM

## 2020-01-28 MED ORDER — LORAZEPAM 2 MG/ML IJ SOLN
2.0000 mg | Freq: Once | INTRAMUSCULAR | Status: AC
  Administered 2020-01-28: 2 mg via INTRAMUSCULAR
  Filled 2020-01-28: qty 1

## 2020-01-28 NOTE — ED Notes (Signed)
Pt has been redirectable today.  Took po meds with some hesitation. Her mother visited and asked to go ahead and take her home.  Explained that pt is now under IVC and cannot leave and will be reevaluated in the morning.

## 2020-01-28 NOTE — ED Notes (Signed)
Pt continues to sleep after medication administration early this am.  Marlinda Mike NP aware that this writer is not walking pt up for 10 medications.

## 2020-01-28 NOTE — Consult Note (Signed)
Patient continues to rest in bed with her eyes closed, NAD after receiving prn Ativan IM at 0630 this morning for agitation.  Patient unable to be assessed at this time.

## 2020-01-28 NOTE — BH Assessment (Signed)
Ophelia Shoulder, NP continues to recommend inpatient treatment. Patient referred to the following facilities for consideration of a bed.   [facility] Details       [facility] Details       [facility] Details       [facility] Details       [facility] Details       [facility] Details       [facility] Details       [facility] Details       [facility] Details       [facility] Details       [facility]

## 2020-01-29 ENCOUNTER — Other Ambulatory Visit: Payer: Self-pay

## 2020-01-29 ENCOUNTER — Encounter (HOSPITAL_COMMUNITY): Payer: Self-pay | Admitting: Adult Health

## 2020-01-29 ENCOUNTER — Inpatient Hospital Stay (HOSPITAL_COMMUNITY)
Admission: AD | Admit: 2020-01-29 | Discharge: 2020-02-08 | DRG: 885 | Disposition: A | Discharge status: Home / Self Care | Payer: Medicaid Other | Attending: Psychiatry | Admitting: Psychiatry

## 2020-01-29 DIAGNOSIS — Z807 Family history of other malignant neoplasms of lymphoid, hematopoietic and related tissues: Secondary | ICD-10-CM

## 2020-01-29 DIAGNOSIS — F319 Bipolar disorder, unspecified: Secondary | ICD-10-CM | POA: Diagnosis not present

## 2020-01-29 DIAGNOSIS — R45851 Suicidal ideations: Secondary | ICD-10-CM | POA: Diagnosis present

## 2020-01-29 DIAGNOSIS — G4700 Insomnia, unspecified: Secondary | ICD-10-CM | POA: Diagnosis present

## 2020-01-29 DIAGNOSIS — F22 Delusional disorders: Secondary | ICD-10-CM | POA: Diagnosis present

## 2020-01-29 DIAGNOSIS — F410 Panic disorder [episodic paroxysmal anxiety] without agoraphobia: Secondary | ICD-10-CM | POA: Diagnosis present

## 2020-01-29 DIAGNOSIS — Z888 Allergy status to other drugs, medicaments and biological substances status: Secondary | ICD-10-CM

## 2020-01-29 DIAGNOSIS — F200 Paranoid schizophrenia: Secondary | ICD-10-CM | POA: Diagnosis present

## 2020-01-29 DIAGNOSIS — Z8249 Family history of ischemic heart disease and other diseases of the circulatory system: Secondary | ICD-10-CM

## 2020-01-29 DIAGNOSIS — F250 Schizoaffective disorder, bipolar type: Secondary | ICD-10-CM | POA: Diagnosis present

## 2020-01-29 DIAGNOSIS — I10 Essential (primary) hypertension: Secondary | ICD-10-CM | POA: Diagnosis present

## 2020-01-29 DIAGNOSIS — E569 Vitamin deficiency, unspecified: Secondary | ICD-10-CM | POA: Diagnosis present

## 2020-01-29 DIAGNOSIS — Z9114 Patient's other noncompliance with medication regimen: Secondary | ICD-10-CM | POA: Diagnosis not present

## 2020-01-29 MED ORDER — HALOPERIDOL LACTATE 5 MG/ML IJ SOLN
5.0000 mg | Freq: Two times a day (BID) | INTRAMUSCULAR | Status: DC
  Filled 2020-01-29 (×4): qty 1

## 2020-01-29 MED ORDER — BENZTROPINE MESYLATE 1 MG/ML IJ SOLN
0.5000 mg | Freq: Two times a day (BID) | INTRAMUSCULAR | Status: DC
  Filled 2020-01-29 (×24): qty 0.5

## 2020-01-29 MED ORDER — ALUM & MAG HYDROXIDE-SIMETH 200-200-20 MG/5ML PO SUSP: 30.0000 mL | ORAL | Status: DC | PRN

## 2020-01-29 MED ORDER — ADULT MULTIVITAMIN W/MINERALS CH
1.0000 | ORAL_TABLET | Freq: Every day | ORAL | Status: DC
  Filled 2020-01-29: qty 1

## 2020-01-29 MED ORDER — ADULT MULTIVITAMIN W/MINERALS CH
1.0000 | ORAL_TABLET | Freq: Every day | ORAL | Status: DC
  Administered 2020-01-29: 1 via ORAL
  Filled 2020-01-29: qty 1

## 2020-01-29 MED ORDER — CARBAMAZEPINE 200 MG PO TABS
200.0000 mg | ORAL_TABLET | Freq: Two times a day (BID) | ORAL | Status: DC
  Administered 2020-01-29 – 2020-02-02 (×8): 200 mg via ORAL
  Filled 2020-01-29 (×11): qty 1

## 2020-01-29 MED ORDER — ACETAMINOPHEN 325 MG PO TABS: 650.0000 mg | ORAL_TABLET | Freq: Four times a day (QID) | ORAL | Status: DC | PRN

## 2020-01-29 MED ORDER — HALOPERIDOL 5 MG PO TABS
5.0000 mg | ORAL_TABLET | Freq: Two times a day (BID) | ORAL | Status: DC
  Administered 2020-01-29 – 2020-01-30 (×2): 5 mg via ORAL
  Filled 2020-01-29 (×5): qty 1

## 2020-01-29 MED ORDER — BENZTROPINE MESYLATE 0.5 MG PO TABS
0.5000 mg | ORAL_TABLET | Freq: Two times a day (BID) | ORAL | Status: DC
  Administered 2020-01-29 – 2020-02-08 (×20): 0.5 mg via ORAL
  Filled 2020-01-29 (×26): qty 1

## 2020-01-29 NOTE — Progress Notes (Signed)
Patient meets criteria for inpatient treatment per Ophelia Shoulder, NP. No appropriate beds at [facility] currently. CSW faxed referrals to the following facilities for review:   [facility]   [facility]   [facility]   [facility]   [facility]   [facility]   [facility]   [facility]   [facility]      TTS will continue to seek bed placement.     Ruthann Cancer MSW, [facility] Clincal Social Worker Disposition  [facility] Ph: [phone] Fax: [phone] 01/29/2020 11:53 AM

## 2020-01-29 NOTE — Consult Note (Signed)
[facility] Psych ED Progress Note  01/29/2020 3:39 PM [name]  MRN:  [id number] Subjective:  "I think I am ready to get out of here"  January 29, 2020:  [name] is seen for follow-up evaluation by this writer via telephyschiatry and reviewed with Dr. Lucianne Muss.  On evaluation, the patient tells me she feels better and is ready to go home.  She is more clear and coherent today, able to engage in conversation with this Clinical research associate.  Her speech is not pressured and she pauses appropriately to allow for 2 way interactive dialogue with this Clinical research associate.  This is an improvement from her initial presentation in which she was acutely psychotic, disorganized and tangential, but she does not appear at baseline.  Patient would benefit from additional inpatient stay for continued medication management.  She had previously refused IM haldol and was given via IM; per nursing staff, today she accepts haldol 5mg  po with gentle encouraged from her mother.    January 27, 2020 Subjective:  Patient states "I am doing okay, I am doing all right." Patient assessed by nurse practitioner along with Dr. January 29, 2020. Patient alert and oriented, answers appropriately. Patient participates in assessment with vague responses.  Patient appears to be hyperverbal with pressured and tangential speech. Patient denies suicidal and homicidal ideations.  Patient denies auditory and visual hallucinations.  Patient denies paranoia. Patient reports she lives in [place] with her mother.  Patient denies access to weapons.  Patient denies alcohol and substance use.   Patient unable to discuss current outpatient psychiatric provider, unsure of current psychiatric medications.   Principal Problem: Schizoaffective disorder, bipolar type (HCC)   Diagnosis:  Principal Problem:   Schizoaffective disorder, bipolar type (HCC)     Total Time spent with patient: 15 minutes  Past Psychiatric History: Schizoaffective disorder, bipolar type, schizophrenia, paranoid  type  Past Medical History:  Past Medical History:  Diagnosis Date  . Bipolar 1 disorder (HCC) 2011  . Paranoia (HCC)   . Schizoaffective disorder (HCC)    No past surgical history on file. Family History:  Family History  Problem Relation Age of Onset  . Cancer Father        lymphoma  . Hypertension Mother   . Diabetes Neg Hx   . Heart disease Neg Hx    Family Psychiatric  History: unknwons Social History:  Social History   Substance and Sexual Activity  Alcohol Use No   Comment: Pt denies      Social History   Substance and Sexual Activity  Drug Use No   Comment: Pt denies    Social History   Socioeconomic History  . Marital status: Single    Spouse name: Not on file  . Number of children: 0  . Years of education: some colle  . Highest education level: Not on file  Occupational History  . Occupation: 2012     Comment: trying to start   Tobacco Use  . Smoking status: Never Smoker  . Smokeless tobacco: Never Used  Substance and Sexual Activity  . Alcohol use: No    Comment: Pt denies   . Drug use: No    Comment: Pt denies  . Sexual activity: Not Currently    Birth control/protection: Abstinence, None  Other Topics Concern  . Not on file  Social History Narrative   Work or School: not working   Home Situation: lives with mother   Spiritual Beliefs: Christian   Lifestyle: no regular exercise; healthy diet  Social Determinants of Health   Financial Resource Strain:   . Difficulty of Paying Living Expenses:   Food Insecurity:   . Worried About Charity fundraiser in the Last Year:   . Arboriculturist in the Last Year:   Transportation Needs:   . Film/video editor (Medical):   Marland Kitchen Lack of Transportation (Non-Medical):   Physical Activity:   . Days of Exercise per Week:   . Minutes of Exercise per Session:   Stress:   . Feeling of Stress :   Social Connections:   . Frequency of Communication with Friends and Family:   . Frequency  of Social Gatherings with Friends and Family:   . Attends Religious Services:   . Active Member of Clubs or Organizations:   . Attends Archivist Meetings:   Marland Kitchen Marital Status:     Sleep: Good  Appetite:  Good  Current Medications: Current Facility-Administered Medications  Medication Dose Route Frequency Provider Last Rate Last Admin  . benztropine (COGENTIN) tablet 0.5 mg  0.5 mg Oral BID Emmaline Kluver, FNP   0.5 mg at 01/29/20 3532   Or  . benztropine mesylate (COGENTIN) injection 0.5 mg  0.5 mg Intramuscular BID Emmaline Kluver, FNP   0.5 mg at 01/27/20 2228  . carbamazepine (TEGRETOL) tablet 200 mg  200 mg Oral BID Emmaline Kluver, FNP   200 mg at 01/29/20 0943  . haloperidol (HALDOL) tablet 5 mg  5 mg Oral BID Emmaline Kluver, FNP   5 mg at 01/29/20 9924   Or  . haloperidol lactate (HALDOL) injection 5 mg  5 mg Intramuscular BID Emmaline Kluver, FNP   5 mg at 01/27/20 2229  . multivitamin with minerals tablet 1 tablet  1 tablet Oral Daily Lucrezia Starch, MD       Current Outpatient Medications  Medication Sig Dispense Refill  . Multiple Vitamin (MULTIVITAMIN ADULT) TABS Take 1 tablet by mouth daily.    Marland Kitchen OVER THE COUNTER MEDICATION Take 1 tablet by mouth daily. Calm    . benztropine (COGENTIN) 1 MG tablet Take 1 tablet (1 mg total) by mouth 2 (two) times daily. (Patient not taking: Reported on 09/13/2019) 60 tablet 1  . carbamazepine (TEGRETOL) 200 MG tablet Take 1 tablet (200 mg total) by mouth 2 (two) times daily after a meal. (Patient not taking: Reported on 09/13/2019) 60 tablet 2  . haloperidol (HALDOL) 10 MG tablet Take 1 tablet (10 mg total) by mouth at bedtime. (Patient not taking: Reported on 09/13/2019) 90 tablet 1  . haloperidol decanoate (HALDOL DECANOATE) 100 MG/ML injection Inject 1 mL (100 mg total) into the muscle every 28 (twenty-eight) days. Next due 12/28/18 (Patient not taking: Reported on 09/13/2019) 1 mL 11    Lab Results: No results found for this or any  previous visit (from the past 48 hour(s)).  Blood Alcohol level:  Lab Results  Component Value Date   ETH <10 01/26/2020   ETH <10 11/27/2018    Physical Findings: AIMS:  , ,  ,  ,    CIWA:    COWS:     Musculoskeletal: Strength & Muscle Tone: within normal limits Gait & Station: normal Patient leans: N/A  Psychiatric Specialty Exam: Physical Exam Cardiovascular:     Rate and Rhythm: Normal rate.  Pulmonary:     Effort: Pulmonary effort is normal.  Musculoskeletal:     Cervical back: Normal range of motion.  Neurological:  Mental Status: She is alert.     Review of Systems  Psychiatric/Behavioral: Positive for dysphoric mood.    Blood pressure (!) (P) 109/91, pulse (P) 95, temperature (P) 98.4 F (36.9 C), temperature source (P) Oral, resp. rate (P) 18, SpO2 (P) 100 %.There is no height or weight on file to calculate BMI.  General Appearance: Casual and her hair is groomed, improvement since admission  Eye Contact:  Good  Speech:  Clear and Coherent and Normal Rate  Volume:  Normal  Mood:  Euthymic and relaxed  Affect:  Congruent and appropriately smiles during assessment  Thought Process:  Coherent and Descriptions of Associations: Intact  Orientation:  Full (Time, Place, and Person)  Thought Content:  Logical and improved since admission but not as baseline  Suicidal Thoughts:  No  Homicidal Thoughts:  No  Memory:  Immediate;   Fair  Judgement:  Fair, in lieu of chronic mental illness and medication non-compliance  Insight:  Fair  Psychomotor Activity:  Normal  Concentration:  Concentration: Fair and Attention Span: Fair  Recall:  [employer] of Knowledge:  Good  Language:  Good  Akathisia:  NA  Handed:  Right  AIMS (if indicated):     Assets:  Desire for Improvement Financial Resources/Insurance Social Support  ADL's:  Intact  Cognition:  Impaired,  Mild, improving   Sleep:   > 8 hours    Treatment Plan Summary: Daily contact with patient to  assess and evaluate symptoms and progress in treatment and Medication management  Patient continues to meet inpatient criteria.  There are no appropriate rooms available at [facility], thus SW will continue to fax her out for placement.    In the interim, recommend she continue medications as listed below: Haldol 5mg  po for psychosis BID Cogentin 0.5mg  po BID for EPS  Disposition discussed with Dr. , EDP.   Stevie Kern, NP 01/29/2020, 3:39 PM

## 2020-01-29 NOTE — Progress Notes (Signed)
01/29/2020  1730  Called Police for transport to [facility] [phone].

## 2020-01-29 NOTE — Progress Notes (Signed)
Pt accepted to [facility] Room 506-01, to the service of MD Clary.  Report may be called to [phone]

## 2020-01-29 NOTE — Progress Notes (Signed)
Pt a new admit to [facility] from [facility] ED under Involuntary commitment status. Pt Comitted for increased Manic behavior.  Presents to [facility] suspicious and paranoid  With soft speech and disorganized thoughts. States she thinks people are out to get her.  When asked why she was committed to the hospital states " My mother. A spirit was following her. I need to stop using [employer] phones." Pt. ambulatory with a steady gait. Denies SI, HI, AVH and pain at this time. Denies substance use. Skin assessment completed and belonging searched per protocol. Q 15 minutes safety checks initiated without self harm gestures. Writer encouraged pt to voice concerns. Pt remains safe on unit. Kathlene November RN will assume care of patient for rest of the shift and pass on report to oncoming shift.

## 2020-01-29 NOTE — Tx Team (Signed)
Initial Treatment Plan 01/29/2020 6:47 PM [name] MIT:[id number]    PATIENT STRESSORS: Financial difficulties Medication change or noncompliance   PATIENT STRENGTHS: Physical Health Religious Affiliation Supportive family/friends   PATIENT IDENTIFIED PROBLEMS: Acute Psychosis   Disorganized thoughts  Paranoia   " I want to work on being truthful and being myself"               DISCHARGE CRITERIA:  Ability to meet basic life and health needs Adequate post-discharge living arrangements Safe-care adequate arrangements made Verbal commitment to aftercare and medication compliance  PRELIMINARY DISCHARGE PLAN: Return to previous living arrangement  PATIENT/FAMILY INVOLVEMENT: This treatment plan has been presented to and reviewed with the patient, [name], and/or family member.  The patient and family have been given the opportunity to ask questions and make suggestions.  Normajean Baxter, RN 01/29/2020, 6:47 PM

## 2020-01-29 NOTE — Progress Notes (Signed)
Adult Psychoeducational Group Note  Date:  01/29/2020 Time:  11:28 PM  Group Topic/Focus:  Wrap-Up Group:   The focus of this group is to help patients review their daily goal of treatment and discuss progress on daily workbooks.  Participation Level:  Did Not Attend  Participation Quality:  Did Not Attend  Affect:  Did Not Attend  Cognitive:  Did Not Attend  Insight: None  Engagement in Group:  Did Not Attend  Modes of Intervention:  Did Not Attend  Additional Comments:  Pt did not attend evening wrap up group tonight.  Felipa Furnace 01/29/2020, 11:28 PM

## 2020-01-29 NOTE — ED Provider Notes (Signed)
Emergency Medicine Observation Re-evaluation Note  [name] is a 34 y.o. female, seen on rounds today.  Pt initially presented to the ED for complaints of Manic Behavior Currently, the patient is awake, resting in bed, no distress.  Physical Exam  BP 104/75 (BP Location: Left Arm)   Pulse 98   Temp 98.9 F (37.2 C) (Oral)   Resp 18   SpO2 97%  Physical Exam Constitutional:      Appearance: Normal appearance.  HENT:     Head: Normocephalic.     Mouth/Throat:     Mouth: Mucous membranes are moist.  Pulmonary:     Effort: Pulmonary effort is normal. No respiratory distress.  Musculoskeletal:        General: No deformity or signs of injury.  Skin:    Capillary Refill: Capillary refill takes less than 2 seconds.  Neurological:     General: No focal deficit present.     Mental Status: She is alert.     ED Course / MDM  EKG:EKG Interpretation  Date/Time:  Thursday January 26 2020 15:38:08 EDT Ventricular Rate:  92 PR Interval:  164 QRS Duration: 80 QT Interval:  338 QTC Calculation: 417 R Axis:   54 Text Interpretation: Normal sinus rhythm Nonspecific T wave abnormality Abnormal ECG No significant change since last tracing Confirmed by Jacalyn Lefevre [phone]) on 01/26/2020 3:46:11 PM    I have reviewed the labs performed to date as well as medications administered while in observation.  Recent changes in the last 24 hours include awaiting placement. Plan  Current plan is for in pt psych. Patient is under full IVC at this time.   Milagros Loll, MD 01/29/20 [phone]

## 2020-01-29 NOTE — Progress Notes (Signed)
01/29/2020  1735  Called [phone]. Report given to [facility].

## 2020-01-29 NOTE — Progress Notes (Signed)
   01/29/20 2115  COVID-19 Daily Checkoff  Have you had a fever (temp > 37.80C/100F)  in the past 24 hours?  No  If you have had runny nose, nasal congestion, sneezing in the past 24 hours, has it worsened? No  COVID-19 EXPOSURE  Have you traveled outside the state in the past 14 days? No  Have you been in contact with someone with a confirmed diagnosis of COVID-19 or PUI in the past 14 days without wearing appropriate PPE? No  Have you been living in the same home as a person with confirmed diagnosis of COVID-19 or a PUI (household contact)? No  Have you been diagnosed with COVID-19? No

## 2020-01-29 NOTE — Progress Notes (Signed)
Patient has been observed and has been preoccupied and is responding to internal stimuli. Writer had to explain the importance of hr taking her medications because she did not want to take them. She is isolative to her room tonight. Support given and safety maintained with 15 min checks.

## 2020-01-30 DIAGNOSIS — F250 Schizoaffective disorder, bipolar type: Principal | ICD-10-CM

## 2020-01-30 MED ORDER — HALOPERIDOL 5 MG PO TABS
10.0000 mg | ORAL_TABLET | Freq: Two times a day (BID) | ORAL | Status: DC
  Administered 2020-01-30 – 2020-02-01 (×5): 10 mg via ORAL
  Filled 2020-01-30 (×8): qty 2

## 2020-01-30 MED ORDER — HALOPERIDOL DECANOATE 100 MG/ML IM SOLN
100.0000 mg | Freq: Once | INTRAMUSCULAR | Status: AC
  Administered 2020-01-30: 100 mg via INTRAMUSCULAR
  Filled 2020-01-30: qty 1

## 2020-01-30 MED ORDER — HALOPERIDOL LACTATE 5 MG/ML IJ SOLN
10.0000 mg | Freq: Two times a day (BID) | INTRAMUSCULAR | Status: DC
  Filled 2020-01-30 (×8): qty 2

## 2020-01-30 NOTE — Progress Notes (Signed)
Recreation Therapy Notes  Date: 6.7.21 Time: 1000 Location: 500 Hall Dayroom   Group Topic: Coping Skills  Goal Area(s) Addresses:  Patient will identify positive coping skills. Patient will identify benefit of using positive coping skills post d/c.  Intervention: Worksheet, pencils  Activity:  Orthoptist.  Patients were to identify the things they struggle with that holds them back and write them inside the web. Patients would then identify at least 2 coping skills for each struggle they identified and write on the outside of the web.  Education: Pharmacologist, Building control surveyor.   Education Outcome: Acknowledges understanding/In group clarification offered/Needs additional education.   Clinical Observations/Feedback:  Pt did not attend group session.    Caroll Rancher, LRT/CTRS         Caroll Rancher A 01/30/2020 11:31 AM

## 2020-01-30 NOTE — Progress Notes (Signed)
Pt kept to herself much of the evening. Pt continues to appear to be responding to internal stimuli.    01/30/20 2100  Psych Admission Type (Psych Patients Only)  Admission Status Involuntary  Psychosocial Assessment  Patient Complaints Suspiciousness  Eye Contact Fair  Facial Expression Anxious;Pensive;Worried  Affect Anxious;Preoccupied  Furniture conservator/restorer;Restless;Unsteady  Appearance/Hygiene Improved  Behavior Characteristics Cooperative;Guarded  Mood Preoccupied  Thought Process  Coherency Disorganized;Flight of ideas  Content Confabulation;Delusions;Preoccupation;Paranoia  Delusions Controlled;Paranoid;Persecutory  Perception Derealization;Hallucinations  Hallucination None reported or observed;Auditory  Judgment Poor  Confusion Moderate  Danger to Self  Current suicidal ideation? Denies  Danger to Others  Danger to Others None reported or observed

## 2020-01-30 NOTE — H&P (Signed)
Psychiatric Admission Assessment Adult  Patient Identification: [name] MRN:  [id number] Date of Evaluation:  01/30/2020 Chief Complaint:  Bipolar affective disorder (HCC) [F31.9] Principal Diagnosis: <principal problem not specified> Diagnosis:  Active Problems:   Bipolar affective disorder (HCC)  History of Present Illness: Patient is seen and examined.  Patient is a 34 year old female with a past psychiatric history significant for schizoaffective disorder; bipolar type versus bipolar disorder type I who presented to the [facility] emergency department on 01/27/2020 with mania.  The patient was noted on admission to be hyperverbal, and her speech was pressured.  The assessment was unable to really determine what had taken place.  Attempts to contact the patient's mother with whom she lives were not successful in the emergency department.  On examination today the patient speech is not hyperverbal per se or pressured but is very disorganized.  Review of the electronic medical record revealed that the patient had been previously diagnosed with manic behavior as well as paranoid schizophrenia.  Her last psychiatric hospitalization at our facility was in April 2020.  Her discharge medications at that time included Cogentin, Tegretol, Haldol and Haldol Decanoate.  Review of the electronic medical record revealed that it appeared the last time she had gotten a Haldol Decanoate injection was on 09/13/2019.  She had also been taking Tegretol at that time.  She was admitted to the hospital for evaluation and stabilization.  Associated Signs/Symptoms: Depression Symptoms:  anhedonia, insomnia, fatigue, difficulty concentrating, hopelessness, suicidal thoughts without plan, anxiety, panic attacks, disturbed sleep, (Hypo) Manic Symptoms:  Delusions, Hallucinations, Impulsivity, Irritable Mood, Labiality of Mood, Anxiety Symptoms:  Excessive Worry, Psychotic Symptoms:   Delusions, Paranoia, PTSD Symptoms: Negative Total Time spent with patient: 30 minutes  Past Psychiatric History: Patient has previously been diagnosed with schizoaffective disorder; bipolar type as well as bipolar disorder.  She has had at least 6 or 7 psychiatric hospitalizations in the past.  Her last psychiatric hospitalization at our facility was on 11/29/2018.  It does appear that she has been noncompliant with medications as well as treatment since her last hospitalization.  Her medications on discharge from the hospitalization in 2020 included Cogentin, Tegretol, Haldol as well as Haldol Decanoate 100 mg IM.  Is the patient at risk to self? Yes.    Has the patient been a risk to self in the past 6 months? Yes.    Has the patient been a risk to self within the distant past? Yes.    Is the patient a risk to others? No.  Has the patient been a risk to others in the past 6 months? No.  Has the patient been a risk to others within the distant past? No.   Prior Inpatient Therapy:   Prior Outpatient Therapy:    Alcohol Screening: 1. How often do you have a drink containing alcohol?: Never 2. How many drinks containing alcohol do you have on a typical day when you are drinking?: 1 or 2 3. How often do you have six or more drinks on one occasion?: Never AUDIT-C Score: 0 4. How often during the last year have you found that you were not able to stop drinking once you had started?: Never 5. How often during the last year have you failed to do what was normally expected from you because of drinking?: Never 6. How often during the last year have you needed a first drink in the morning to get yourself going after a heavy drinking session?: Never  7. How often during the last year have you had a feeling of guilt of remorse after drinking?: Never 8. How often during the last year have you been unable to remember what happened the night before because you had been drinking?: Never 9. Have you or  someone else been injured as a result of your drinking?: No 10. Has a relative or friend or a doctor or another health worker been concerned about your drinking or suggested you cut down?: No Alcohol Use Disorder Identification Test Final Score (AUDIT): 0 Substance Abuse History in the last 12 months:  No. Consequences of Substance Abuse: Negative Previous Psychotropic Medications: Yes  Psychological Evaluations: Yes  Past Medical History:  Past Medical History:  Diagnosis Date  . Bipolar 1 disorder ([place]) 2011  . Paranoia ([place])   . Schizoaffective disorder ([place])    History reviewed. No pertinent surgical history. Family History:  Family History  Problem Relation Age of Onset  . Cancer Father        lymphoma  . Hypertension Mother   . Diabetes Neg Hx   . Heart disease Neg Hx    Family Psychiatric  History: Although currently disorganized the patient reported no family history of psychiatric illness. Tobacco Screening:   Social History:  Social History   Substance and Sexual Activity  Alcohol Use No   Comment: Pt denies      Social History   Substance and Sexual Activity  Drug Use No   Comment: Pt denies    Additional Social History:                           Allergies:   Allergies  Allergen Reactions  . Quetiapine Other (See Comments)    Facial skin irritation  . Yes For Women [Intimacy Products] Rash  . Zyprexa [Olanzapine] Rash   Lab Results: No results found for this or any previous visit (from the past 48 hour(s)).  Blood Alcohol level:  Lab Results  Component Value Date   ETH <10 01/26/2020   ETH <10 14/78/2956    Metabolic Disorder Labs:  Lab Results  Component Value Date   HGBA1C 5.5 11/02/2017   MPG 111.15 11/02/2017   No results found for: PROLACTIN Lab Results  Component Value Date   CHOL 179 07/27/2014   TRIG 49 07/27/2014   HDL 44 07/27/2014   CHOLHDL 5 06/21/2014   VLDL 10 07/27/2014   LDLCALC 125 (H) 07/27/2014    LDLCALC 100 (H) 06/21/2014    Current Medications: Current Facility-Administered Medications  Medication Dose Route Frequency Provider Last Rate Last Admin  . acetaminophen (TYLENOL) tablet 650 mg  650 mg Oral Q6H PRN Mallie Darting, NP      . alum & mag hydroxide-simeth (MAALOX/MYLANTA) 200-200-20 MG/5ML suspension 30 mL  30 mL Oral Q4H PRN Merlyn Lot E, NP      . benztropine (COGENTIN) tablet 0.5 mg  0.5 mg Oral BID Merlyn Lot E, NP   0.5 mg at 01/30/20 2130   Or  . benztropine mesylate (COGENTIN) injection 0.5 mg  0.5 mg Intramuscular BID Merlyn Lot E, NP      . carbamazepine (TEGRETOL) tablet 200 mg  200 mg Oral BID Merlyn Lot E, NP   200 mg at 01/30/20 [id number]  . haloperidol (HALDOL) tablet 10 mg  10 mg Oral BID Sharma Covert, MD       Or  . haloperidol lactate (HALDOL) injection 10 mg  10 mg Intramuscular BID Antonieta Pert, MD       PTA Medications: Medications Prior to Admission  Medication Sig Dispense Refill Last Dose  . benztropine (COGENTIN) 1 MG tablet Take 1 tablet (1 mg total) by mouth 2 (two) times daily. (Patient not taking: Reported on 09/13/2019) 60 tablet 1   . carbamazepine (TEGRETOL) 200 MG tablet Take 1 tablet (200 mg total) by mouth 2 (two) times daily after a meal. (Patient not taking: Reported on 09/13/2019) 60 tablet 2   . haloperidol (HALDOL) 10 MG tablet Take 1 tablet (10 mg total) by mouth at bedtime. (Patient not taking: Reported on 09/13/2019) 90 tablet 1   . haloperidol decanoate (HALDOL DECANOATE) 100 MG/ML injection Inject 1 mL (100 mg total) into the muscle every 28 (twenty-eight) days. Next due 12/28/18 (Patient not taking: Reported on 09/13/2019) 1 mL 11   . Multiple Vitamin (MULTIVITAMIN ADULT) TABS Take 1 tablet by mouth daily.     Marland Kitchen OVER THE COUNTER MEDICATION Take 1 tablet by mouth daily. Calm       Musculoskeletal: Strength & Muscle Tone: within normal limits Gait & Station: normal Patient leans: N/A  Psychiatric Specialty  Exam: Physical Exam  Nursing note and vitals reviewed. Constitutional: She is oriented to person, place, and time. She appears well-developed and well-nourished.  HENT:  Head: Normocephalic and atraumatic.  Respiratory: Effort normal.  Neurological: She is alert and oriented to person, place, and time.    Review of Systems  Blood pressure 101/84, pulse 83, temperature 98.8 F (37.1 C), temperature source Oral, resp. rate 20, height 5\' 3"  (1.6 m), weight 60.8 kg, SpO2 100 %.Body mass index is 23.74 kg/m.  General Appearance: Disheveled  Eye Contact:  Fair  Speech:  Pressured  Volume:  Normal  Mood:  Anxious, Depressed and Dysphoric  Affect:  Flat  Thought Process:  Disorganized and Descriptions of Associations: Loose  Orientation:  Full (Time, Place, and Person)  Thought Content:  Delusions, Hallucinations: Auditory, Paranoid Ideation, Rumination and Tangential  Suicidal Thoughts:  No  Homicidal Thoughts:  No  Memory:  Immediate;   Poor Recent;   Poor Remote;   Poor  Judgement:  Impaired  Insight:  Lacking  Psychomotor Activity:  Increased  Concentration:  Concentration: Fair and Attention Span: Fair  Recall:  of Knowledge:  Fair  Language:  Fair  Akathisia:  Negative  Handed:  Right  AIMS (if indicated):     Assets:  Desire for Improvement Resilience  ADL's:  Impaired  Cognition:  WNL  Sleep:  Number of Hours: 4.25    Treatment Plan Summary: Daily contact with patient to assess and evaluate symptoms and progress in treatment, Medication management and Plan :  Patient is seen and examined.  Patient is a 34 year old female with what appears to be schizophrenia versus schizoaffective disorder who presented disorganized and manic.  She will be admitted to the hospital.  She will be integrated in the milieu.  She will be encouraged to attend groups.  On admission she was restarted on Tegretol and Haldol.  She was given 5 mg p.o. twice daily or 5 mg IM twice daily if  she refused the oral medication.  She was started on Tegretol 200 mg p.o. twice daily as well as her Cogentin.  We will attempt to give her the long-acting Haldol injection today.  We will contact the mother for collateral information and find out how long she has been noncompliant with medications.  Review of her admission laboratories from 6/3 is showed essentially normal electrolytes including liver function enzymes.  Normal CBC, normal differential.  Beta-hCG was negative.  Urinalysis was negative.  Drug screen was negative.  Blood alcohol was less than 10.  EKG was a normal sinus rhythm with a QTC of 417.  Observation Level/Precautions:  15 minute checks  Laboratory:  Chemistry Profile  Psychotherapy:    Medications:    Consultations:    Discharge Concerns:    Estimated LOS:  Other:     Physician Treatment Plan for Primary Diagnosis: <principal problem not specified> Long Term Goal(s): Improvement in symptoms so as ready for discharge  Short Term Goals: Ability to identify changes in lifestyle to reduce recurrence of condition will improve, Ability to verbalize feelings will improve, Ability to demonstrate self-control will improve, Ability to identify and develop effective coping behaviors will improve, Ability to maintain clinical measurements within normal limits will improve and Compliance with prescribed medications will improve  Physician Treatment Plan for Secondary Diagnosis: Active Problems:   Bipolar affective disorder (HCC)  Long Term Goal(s): Improvement in symptoms so as ready for discharge  Short Term Goals: Ability to identify changes in lifestyle to reduce recurrence of condition will improve, Ability to verbalize feelings will improve, Ability to demonstrate self-control will improve, Ability to identify and develop effective coping behaviors will improve, Ability to maintain clinical measurements within normal limits will improve and Compliance with prescribed medications  will improve  I certify that inpatient services furnished can reasonably be expected to improve the patient's condition.    Antonieta Pert, MD 6/7/20211:59 PM

## 2020-01-30 NOTE — BHH Suicide Risk Assessment (Signed)
[facility] Admission Suicide Risk Assessment   Nursing information obtained from:  Patient Demographic factors:  Low socioeconomic status Current Mental Status:  NA Loss Factors:  Decline in physical health Historical Factors:  Impulsivity Risk Reduction Factors:  Sense of responsibility to family, Religious beliefs about death, Positive therapeutic relationship, Positive social support, Living with another person, especially a relative  Total Time spent with patient: 45 minutes Principal Problem: <principal problem not specified> Diagnosis:  Active Problems:   Bipolar affective disorder (HCC)  Subjective Data: Patient is seen and examined.  Patient is a 34 year old female with a past psychiatric history significant for schizoaffective disorder; bipolar type versus bipolar disorder type I who presented to the [facility] emergency department on 01/27/2020 with mania.  The patient was noted on admission to be hyperverbal, and her speech was pressured.  The assessment was unable to really determine what had taken place.  Attempts to contact the patient's mother with whom she lives were not successful in the emergency department.  On examination today the patient speech is not hyperverbal per se or pressured but is very disorganized.  Review of the electronic medical record revealed that the patient had been previously diagnosed with manic behavior as well as paranoid schizophrenia.  Her last psychiatric hospitalization at our facility was in April 2020.  Her discharge medications at that time included Cogentin, Tegretol, Haldol and Haldol Decanoate.  Review of the electronic medical record revealed that it appeared the last time she had gotten a Haldol Decanoate injection was on 09/13/2019.  She had also been taking Tegretol at that time.  She was admitted to the hospital for evaluation and stabilization.  Continued Clinical Symptoms:  Alcohol Use Disorder Identification Test Final Score  (AUDIT): 0 The "Alcohol Use Disorders Identification Test", Guidelines for Use in Primary Care, Second Edition.  World Science writer [facility]). Score between 0-7:  no or low risk or alcohol related problems. Score between 8-15:  moderate risk of alcohol related problems. Score between 16-19:  high risk of alcohol related problems. Score 20 or above:  warrants further diagnostic evaluation for alcohol dependence and treatment.   CLINICAL FACTORS:   Bipolar Disorder:   Mixed State Schizophrenia:   Less than 3 years old Paranoid or undifferentiated type   Musculoskeletal: Strength & Muscle Tone: within normal limits Gait & Station: normal Patient leans: N/A  Psychiatric Specialty Exam: Physical Exam  Nursing note and vitals reviewed. Constitutional: She is oriented to person, place, and time. She appears well-developed and well-nourished.  HENT:  Head: Normocephalic and atraumatic.  Respiratory: Effort normal.  Neurological: She is alert and oriented to person, place, and time.    Review of Systems  Blood pressure 101/84, pulse 83, temperature 98.8 F (37.1 C), temperature source Oral, resp. rate 20, height 5\' 3"  (1.6 m), weight 60.8 kg, SpO2 100 %.Body mass index is 23.74 kg/m.  General Appearance: Casual  Eye Contact:  Minimal  Speech:  Garbled and Normal Rate  Volume:  Normal  Mood:  Dysphoric  Affect:  Congruent  Thought Process:  Disorganized and Descriptions of Associations: Circumstantial  Orientation:  Full (Time, Place, and Person)  Thought Content:  Delusions, Paranoid Ideation and Rumination  Suicidal Thoughts:  No  Homicidal Thoughts:  No  Memory:  Immediate;   Poor Recent;   Poor Remote;   Poor  Judgement:  Impaired  Insight:  Lacking  Psychomotor Activity:  Increased  Concentration:  Concentration: Poor and Attention Span: Poor  Recall:  Poor  Fund of Knowledge:  Poor  Language:  Poor  Akathisia:  Negative  Handed:  Right  AIMS (if indicated):      Assets:  Desire for Improvement Resilience  ADL's:  Intact  Cognition:  WNL  Sleep:  Number of Hours: 4.25      COGNITIVE FEATURES THAT CONTRIBUTE TO RISK:  None    SUICIDE RISK:   Minimal: No identifiable suicidal ideation.  Patients presenting with no risk factors but with morbid ruminations; may be classified as minimal risk based on the severity of the depressive symptoms  PLAN OF CARE: Patient is seen and examined.  Patient is a 34 year old female with what appears to be schizophrenia versus schizoaffective disorder who presented disorganized and manic.  She will be admitted to the hospital.  She will be integrated in the milieu.  She will be encouraged to attend groups.  On admission she was restarted on Tegretol and Haldol.  She was given 5 mg p.o. twice daily or 5 mg IM twice daily if she refused the oral medication.  She was started on Tegretol 200 mg p.o. twice daily as well as her Cogentin.  We will attempt to give her the long-acting Haldol injection today.  We will contact the mother for collateral information and find out how long she has been noncompliant with medications.  Review of her admission laboratories from 6/3 is showed essentially normal electrolytes including liver function enzymes.  Normal CBC, normal differential.  Beta-hCG was negative.  Urinalysis was negative.  Drug screen was negative.  Blood alcohol was less than 10.  EKG was a normal sinus rhythm with a QTC of 417.  I certify that inpatient services furnished can reasonably be expected to improve the patient's condition.   Sharma Covert, MD 01/30/2020, 10:37 AM

## 2020-01-30 NOTE — BHH Suicide Risk Assessment (Signed)
[facility] INPATIENT:  Family/Significant Other Suicide Prevention Education  Suicide Prevention Education: Education Completed; Mother, [name] [phone]), has been identified by the patient as the family member/significant other with whom the patient will be residing, and identified as the person(s) who will aid the patient in the event of a mental health crisis (suicidal ideations/suicide attempt).  With written consent from the patient, the family member/significant other has been provided the following suicide prevention education, prior to the and/or following the discharge of the patient.  The suicide prevention education provided includes the following:  Suicide risk factors  Suicide prevention and interventions  National Suicide Hotline telephone number  [facility] assessment telephone number  [facility] Emergency Assistance 911  [facility] and/or Residential Mobile Crisis Unit telephone number   Request made of family/significant other to:  Remove weapons (e.g., guns, rifles, knives), all items previously/currently identified as safety concern.    Remove drugs/medications (over-the-counter, prescriptions, illicit drugs), all items previously/currently identified as a safety concern.   The family member/significant other verbalizes understanding of the suicide prevention education information provided.  The family member/significant other agrees to remove the items of safety concern listed above.   CSW spoke with patients mother who stated that this patient had been receiving services through [facility] the [place], however she stated that this patient has not been attending appointments consistently. Patients mother stated that this patient tends to refuse injections and prefers to take oral medications. Per patients mother pt is able to return home to continue living with her once discharged and she reported having no safety concerns for this  patient.    Ruthann Cancer MSW, [employer] Clincal Social Worker  [facility]

## 2020-01-30 NOTE — Progress Notes (Signed)
DAR NOTE: Patient presents with anxious affect and mood.  Observed talking to herself and responding internally.  Preoccupied with getting discharge.  Denies Suicidal thoughts/HI and pain.   Rates depression at 0, hopelessness at 0, and anxiety at 5.  Maintained on routine safety checks.  Medications given as prescribed.  Support and encouragement offered as needed.    States goal for today is "make it to discharge properly."  Patient visible in milieu and hallway interacting with staff and peers.  Patient is safe on and off the unit.  Offered no complaint.
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
## 2020-01-30 NOTE — Tx Team (Signed)
Interdisciplinary Treatment and Diagnostic Plan Update  01/30/2020 Time of Session: 9:10am CHERIL SLATTERY MRN: [id number]  Principal Diagnosis: <principal problem not specified>  Secondary Diagnoses: Active Problems:   Bipolar affective disorder ([place])   Current Medications:  Current Facility-Administered Medications  Medication Dose Route Frequency Provider Last Rate Last Admin  . acetaminophen (TYLENOL) tablet 650 mg  650 mg Oral Q6H PRN Mallie Darting, NP      . alum & mag hydroxide-simeth (MAALOX/MYLANTA) 200-200-20 MG/5ML suspension 30 mL  30 mL Oral Q4H PRN Merlyn Lot E, NP      . benztropine (COGENTIN) tablet 0.5 mg  0.5 mg Oral BID Merlyn Lot E, NP   0.5 mg at 01/30/20 4034   Or  . benztropine mesylate (COGENTIN) injection 0.5 mg  0.5 mg Intramuscular BID Merlyn Lot E, NP      . carbamazepine (TEGRETOL) tablet 200 mg  200 mg Oral BID Merlyn Lot E, NP   200 mg at 01/30/20 7425  . haloperidol (HALDOL) tablet 5 mg  5 mg Oral BID Merlyn Lot E, NP   5 mg at 01/30/20 0827   Or  . haloperidol lactate (HALDOL) injection 5 mg  5 mg Intramuscular BID Merlyn Lot E, NP       PTA Medications: Medications Prior to Admission  Medication Sig Dispense Refill Last Dose  . benztropine (COGENTIN) 1 MG tablet Take 1 tablet (1 mg total) by mouth 2 (two) times daily. (Patient not taking: Reported on 09/13/2019) 60 tablet 1   . carbamazepine (TEGRETOL) 200 MG tablet Take 1 tablet (200 mg total) by mouth 2 (two) times daily after a meal. (Patient not taking: Reported on 09/13/2019) 60 tablet 2   . haloperidol (HALDOL) 10 MG tablet Take 1 tablet (10 mg total) by mouth at bedtime. (Patient not taking: Reported on 09/13/2019) 90 tablet 1   . haloperidol decanoate (HALDOL DECANOATE) 100 MG/ML injection Inject 1 mL (100 mg total) into the muscle every 28 (twenty-eight) days. Next due 12/28/18 (Patient not taking: Reported on 09/13/2019) 1 mL 11   . Multiple Vitamin (MULTIVITAMIN ADULT) TABS Take 1  tablet by mouth daily.     Marland Kitchen OVER THE COUNTER MEDICATION Take 1 tablet by mouth daily. Calm       Patient Stressors: Financial difficulties Medication change or noncompliance  Patient Strengths: Physical Health Religious Affiliation Supportive family/friends  Treatment Modalities: Medication Management, Group therapy, Case management,  1 to 1 session with clinician, Psychoeducation, Recreational therapy.   Physician Treatment Plan for Primary Diagnosis: <principal problem not specified> Long Term Goal(s):     Short Term Goals:    Medication Management: Evaluate patient's response, side effects, and tolerance of medication regimen.  Therapeutic Interventions: 1 to 1 sessions, Unit Group sessions and Medication administration.  Evaluation of Outcomes: Not Met  Physician Treatment Plan for Secondary Diagnosis: Active Problems:   Bipolar affective disorder ([place])  Long Term Goal(s):     Short Term Goals:       Medication Management: Evaluate patient's response, side effects, and tolerance of medication regimen.  Therapeutic Interventions: 1 to 1 sessions, Unit Group sessions and Medication administration.  Evaluation of Outcomes: Not Met   RN Treatment Plan for Primary Diagnosis: <principal problem not specified> Long Term Goal(s): Knowledge of disease and therapeutic regimen to maintain health will improve  Short Term Goals: Ability to remain free from injury will improve, Ability to participate in decision making will improve, Ability to identify and develop effective coping behaviors  will improve and Compliance with prescribed medications will improve  Medication Management: RN will administer medications as ordered by provider, will assess and evaluate patient's response and provide education to patient for prescribed medication. RN will report any adverse and/or side effects to prescribing provider.  Therapeutic Interventions: 1 on 1 counseling sessions,  Psychoeducation, Medication administration, Evaluate responses to treatment, Monitor vital signs and CBGs as ordered, Perform/monitor CIWA, COWS, AIMS and Fall Risk screenings as ordered, Perform wound care treatments as ordered.  Evaluation of Outcomes: Not Met   LCSW Treatment Plan for Primary Diagnosis: <principal problem not specified> Long Term Goal(s): Safe transition to appropriate next level of care at discharge, Engage patient in therapeutic group addressing interpersonal concerns.  Short Term Goals: Engage patient in aftercare planning with referrals and resources, Increase social support, Identify triggers associated with mental health/substance abuse issues and Increase skills for wellness and recovery  Therapeutic Interventions: Assess for all discharge needs, 1 to 1 time with Social worker, Explore available resources and support systems, Assess for adequacy in community support network, Educate family and significant other(s) on suicide prevention, Complete Psychosocial Assessment, Interpersonal group therapy.  Evaluation of Outcomes: Not Met   Progress in Treatment: Attending groups: No. Participating in groups: No. Taking medication as prescribed: Yes. Toleration medication: Yes. Family/Significant other contact made: No, will contact:  if consent is given.  Patient understands diagnosis: Yes. Discussing patient identified problems/goals with staff: Yes. Medical problems stabilized or resolved: No. Denies suicidal/homicidal ideation: Yes. Issues/concerns per patient self-inventory: No. Other: none   New problem(s) identified: No, Describe:  CSW will assess.  New Short Term/Long Term Goal(s):  detox, medication management for mood stabilization; elimination of SI thoughts; development of comprehensive mental wellness/sobriety plan  Patient Goals:  "Make it out of here properly"  Discharge Plan or Barriers: Patient recently admitted. CSW will continue to follow and  assess for appropriate referrals and possible discharge planning.   Reason for Continuation of Hospitalization: Delusions  Mania Medication stabilization  Estimated Length of Stay: 3-5 days   Attendees: Patient: [name] 01/30/2020   Physician: Myles Lipps, MD 01/30/2020   Nursing:  01/30/2020   RN Care Manager: 01/30/2020  Social Worker: Stephanie Acre, [place]  01/30/2020  Recreational Therapist:  01/30/2020   Other:  01/30/2020   Other:  01/30/2020  Other: 01/30/2020     Scribe for Treatment Team: Vassie Moselle, [place] 01/30/2020 10:53 AM

## 2020-01-30 NOTE — Progress Notes (Signed)
Recreation Therapy Notes  INPATIENT RECREATION THERAPY ASSESSMENT  Patient Details Name: [name] MRN: [id number] DOB: 1986-01-03 Today's Date: 01/30/2020       Information Obtained From: Patient  Able to Participate in Assessment/Interview: Yes  Patient Presentation: Alert  Reason for Admission (Per Patient): Other (Comments)(Pt stated her mom, not having her sister's number and no incentives are the reason she is here.)  Patient Stressors: Other (Comment)(Mom, not having sister's number and not incentives)  Coping Skills:   Journal, Arguments, Music, Meditate, Deep Breathing, Art, Prayer, Avoidance, Read, Dance, Hot Bath/Shower  Leisure Interests (2+):  Community - Other (Comment), Individual - Other (Comment)(Musical art; [employer])  Frequency of Recreation/Participation: Other (Comment)(Seldom)  Awareness of Community Resources:  Yes  Community Resources:  Other (Comment)([employer]; [place] sessions)  Current Use: Yes  If no, Barriers?:    Expressed Interest in [employer] Information: No  County of Residence:  [place]  Patient Main Form of Transportation: Car  Patient Strengths:  Understanding; Being a good leader  Patient Identified Areas of Improvement:  Weight lifting  Patient Goal for Hospitalization:  "learn the necessary coping mechanisms"  Current SI (including self-harm):  No  Current HI:  No  Current AVH: No  Staff Intervention Plan: Group Attendance, Collaborate with Interdisciplinary Treatment Team  Consent to Intern Participation: N/A    Caroll Rancher, LRT/CTRS  Caroll Rancher A 01/30/2020, 12:14 PM

## 2020-01-31 DIAGNOSIS — F319 Bipolar disorder, unspecified: Secondary | ICD-10-CM

## 2020-01-31 MED ORDER — HALOPERIDOL DECANOATE 100 MG/ML IM SOLN: 100.0000 mg | INTRAMUSCULAR | Status: DC

## 2020-01-31 NOTE — Progress Notes (Signed)
[facility] MD Progress Note  01/31/2020 11:08 AM SMERA GUYETTE  MRN:  [id number]   Subjective: Follow-up for this 34 year old female diagnosed with bipolar affective disorder who presented to the hospital under IVC due to noncompliance with medications and reported manic behavior. Patient reports today that she is doing fine and states that she does not know why she was here she was told that she was going to a counseling session.  Patient denies any suicidal or homicidal ideations and denies any hallucinations.  Patient denies attending any groups.  She reports that she slept well she got up to eat breakfast this morning.  Principal Problem: Bipolar affective disorder ([place]) Diagnosis: Principal Problem:   Bipolar affective disorder ([place])  Total Time spent with patient: 20 minutes  Past Psychiatric History: Patient has previously been diagnosed with schizoaffective disorder; bipolar type as well as bipolar disorder.  She has had at least 6 or 7 psychiatric hospitalizations in the past.  Her last psychiatric hospitalization at our facility was on 11/29/2018.  It does appear that she has been noncompliant with medications as well as treatment since her last hospitalization.  Her medications on discharge from the hospitalization in 2020 included Cogentin, Tegretol, Haldol as well as Haldol Decanoate 100 mg IM.  Past Medical History:  Past Medical History:  Diagnosis Date  . Bipolar 1 disorder ([place]) 2011  . Paranoia ([place])   . Schizoaffective disorder ([place])    History reviewed. No pertinent surgical history. Family History:  Family History  Problem Relation Age of Onset  . Cancer Father        lymphoma  . Hypertension Mother   . Diabetes Neg Hx   . Heart disease Neg Hx    Family Psychiatric  History: Although currently disorganized the patient reported no family history of psychiatric illness Social History:  Social History   Substance and Sexual Activity  Alcohol Use No   Comment: Pt denies       Social History   Substance and Sexual Activity  Drug Use No   Comment: Pt denies    Social History   Socioeconomic History  . Marital status: Single    Spouse name: Not on file  . Number of children: 0  . Years of education: some colle  . Highest education level: Not on file  Occupational History  . Occupation: [employer]     Comment: trying to start   Tobacco Use  . Smoking status: Never Smoker  . Smokeless tobacco: Never Used  Substance and Sexual Activity  . Alcohol use: No    Comment: Pt denies   . Drug use: No    Comment: Pt denies  . Sexual activity: Not Currently    Birth control/protection: Abstinence, None  Other Topics Concern  . Not on file  Social History Narrative   Work or School: not working   Home Situation: lives with mother   Spiritual Beliefs: Christian   Lifestyle: no regular exercise; healthy diet         Social Determinants of Health   Financial Resource Strain:   . Difficulty of Paying Living Expenses:   Food Insecurity:   . Worried About Charity fundraiser in the Last Year:   . Arboriculturist in the Last Year:   Transportation Needs:   . Film/video editor (Medical):   Marland Kitchen Lack of Transportation (Non-Medical):   Physical Activity:   . Days of Exercise per Week:   . Minutes of Exercise  per Session:   Stress:   . Feeling of Stress :   Social Connections:   . Frequency of Communication with Friends and Family:   . Frequency of Social Gatherings with Friends and Family:   . Attends Religious Services:   . Active Member of Clubs or Organizations:   . Attends Banker Meetings:   Marland Kitchen Marital Status:    Additional Social History:                         Sleep: Good  Appetite:  Good  Current Medications: Current Facility-Administered Medications  Medication Dose Route Frequency Provider Last Rate Last Admin  . acetaminophen (TYLENOL) tablet 650 mg  650 mg Oral Q6H PRN Chales Abrahams, NP      . alum & mag  hydroxide-simeth (MAALOX/MYLANTA) 200-200-20 MG/5ML suspension 30 mL  30 mL Oral Q4H PRN Ophelia Shoulder E, NP      . benztropine (COGENTIN) tablet 0.5 mg  0.5 mg Oral BID Ophelia Shoulder E, NP   0.5 mg at 01/31/20 0820   Or  . benztropine mesylate (COGENTIN) injection 0.5 mg  0.5 mg Intramuscular BID Ophelia Shoulder E, NP      . carbamazepine (TEGRETOL) tablet 200 mg  200 mg Oral BID Ophelia Shoulder E, NP   200 mg at 01/31/20 0820  . haloperidol (HALDOL) tablet 10 mg  10 mg Oral BID Antonieta Pert, MD   10 mg at 01/31/20 0820   Or  . haloperidol lactate (HALDOL) injection 10 mg  10 mg Intramuscular BID Antonieta Pert, MD      . Melene Muller ON 02/28/2020] haloperidol decanoate (HALDOL DECANOATE) 100 MG/ML injection 100 mg  100 mg Intramuscular Q30 days Neenah Canter, Gerlene Burdock, FNP        Lab Results: No results found for this or any previous visit (from the past 48 hour(s)).  Blood Alcohol level:  Lab Results  Component Value Date   ETH <10 01/26/2020   ETH <10 11/27/2018    Metabolic Disorder Labs: Lab Results  Component Value Date   HGBA1C 5.5 11/02/2017   MPG 111.15 11/02/2017   No results found for: PROLACTIN Lab Results  Component Value Date   CHOL 179 07/27/2014   TRIG 49 07/27/2014   HDL 44 07/27/2014   CHOLHDL 5 06/21/2014   VLDL 10 07/27/2014   LDLCALC 125 (H) 07/27/2014   LDLCALC 100 (H) 06/21/2014    Physical Findings: AIMS: Facial and Oral Movements Muscles of Facial Expression: None, normal Lips and Perioral Area: None, normal Jaw: None, normal Tongue: None, normal,Extremity Movements Upper (arms, wrists, hands, fingers): None, normal Lower (legs, knees, ankles, toes): None, normal, Trunk Movements Neck, shoulders, hips: None, normal, Overall Severity Severity of abnormal movements (highest score from questions above): None, normal Incapacitation due to abnormal movements: None, normal Patient's awareness of abnormal movements (rate only patient's report): No Awareness,  Dental Status Current problems with teeth and/or dentures?: No Does patient usually wear dentures?: No  CIWA:    COWS:     Musculoskeletal: Strength & Muscle Tone: within normal limits Gait & Station: normal Patient leans: N/A  Psychiatric Specialty Exam: Physical Exam  Nursing note and vitals reviewed. Constitutional: She is oriented to person, place, and time. She appears well-developed and well-nourished.  Cardiovascular: Normal rate.  Respiratory: Effort normal.  Musculoskeletal:        General: Normal range of motion.  Neurological: She is alert and oriented to  person, place, and time.  Skin: Skin is warm.    Review of Systems  Constitutional: Negative.   HENT: Negative.   Eyes: Negative.   Respiratory: Negative.   Cardiovascular: Negative.   Gastrointestinal: Negative.   Genitourinary: Negative.   Musculoskeletal: Negative.   Skin: Negative.   Neurological: Negative.     Blood pressure 101/84, pulse 83, temperature 98.8 F (37.1 C), temperature source Oral, resp. rate 20, height 5\' 3"  (1.6 m), weight 60.8 kg, SpO2 100 %.Body mass index is 23.74 kg/m.  General Appearance: Disheveled  Eye Contact:  Fair  Speech:  Clear and Coherent and Normal Rate  Volume:  Decreased  Mood:  Anxious  Affect:  Flat  Thought Process:  Disorganized and Descriptions of Associations: Loose  Orientation:  Full (Time, Place, and Person)  Thought Content:  Hallucinations: Auditory and Paranoid Ideation  Suicidal Thoughts:  No  Homicidal Thoughts:  No  Memory:  Immediate;   Poor Recent;   Fair Remote;   Fair  Judgement:  Impaired  Insight:  Lacking  Psychomotor Activity:  Normal  Concentration:  Concentration: Fair  Recall:  of Knowledge:  Fair  Language:  Fair  Akathisia:  No  Handed:  Right  AIMS (if indicated):     Assets:  Desire for Improvement Financial Resources/Insurance Housing Social Support Transportation  ADL's:  Intact  Cognition:  WNL  Sleep:   Number of Hours: 8   Assessment: Patient presents in her bed lying down but is awake.  Patient is pleasant, calm, cooperative.  Patient has poor insight as to why she is here at the hospital stating she thought she was going to a counseling session.  She is denying all today and does not appear to be responding to any internal or external stimuli during the evaluation.  She has not been aggressive or shown any threatening behavior.  Was reported by nursing staff that the patient had been speaking to herself yesterday afternoon but there have been no new reports as of since.  Potential discharge in the next 2 to 3 days. No new labs. Vital signs are WNL.  Treatment Plan Summary: Daily contact with patient to assess and evaluate symptoms and progress in treatment and Medication management Continue Cogentin 0.5 mg p.o. or IM twice daily for EPS Continue Tegretol 200 mg p.o. twice daily for mood stability Continue Haldol 10 mg p.o. or IM twice daily for bipolar affective disorder Continue Haldol Decanoate 100 mg IM every 30 days with next dose on 02/28/2020 for bipolar affective disorder Encourage group therapy participation Continue every 15 minute safety checks  04/30/2020 Tifanie Gardiner, FNP 01/31/2020, 11:08 AM

## 2020-01-31 NOTE — Progress Notes (Signed)
   01/31/20 2100  Psych Admission Type (Psych Patients Only)  Admission Status Involuntary  Psychosocial Assessment  Patient Complaints Suspiciousness  Eye Contact Fair  Facial Expression Anxious;Pensive;Worried  Affect Anxious;Preoccupied  Furniture conservator/restorer;Restless;Unsteady  Appearance/Hygiene Improved  Behavior Characteristics Appropriate to situation  Mood Suspicious  Thought Process  Coherency Disorganized;Flight of ideas  Content Confabulation;Delusions;Preoccupation;Paranoia  Delusions Controlled;Paranoid;Persecutory  Perception Derealization;Hallucinations  Hallucination None reported or observed;Auditory  Judgment Poor  Confusion Moderate  Danger to Self  Current suicidal ideation? Denies  Danger to Others  Danger to Others None reported or observed

## 2020-01-31 NOTE — Progress Notes (Signed)
   01/31/20 1100  Psych Admission Type (Psych Patients Only)  Admission Status Involuntary  Psychosocial Assessment  Patient Complaints Suspiciousness  Eye Contact Fair  Facial Expression Anxious;Pensive;Worried  Affect Anxious;Preoccupied  Furniture conservator/restorer;Restless;Unsteady  Appearance/Hygiene Improved  Behavior Characteristics Appropriate to situation  Mood Preoccupied  Thought Process  Coherency Disorganized;Flight of ideas  Content Confabulation;Delusions;Preoccupation;Paranoia  Delusions Controlled;Paranoid;Persecutory  Perception Derealization;Hallucinations  Hallucination None reported or observed;Auditory  Judgment Poor  Confusion Moderate  Danger to Self  Current suicidal ideation? Denies  Danger to Others  Danger to Others None reported or observed

## 2020-02-01 NOTE — Progress Notes (Signed)
Pt keeps to herself , continues to respond to internal stimuli     02/01/20 2100  Psych Admission Type (Psych Patients Only)  Admission Status Involuntary  Psychosocial Assessment  Eye Contact Fair  Facial Expression Anxious;Pensive;Worried  Affect Anxious;Preoccupied  Furniture conservator/restorer;Restless;Unsteady  Appearance/Hygiene Improved  Behavior Characteristics Appropriate to situation  Mood Suspicious;Preoccupied  Thought Process  Coherency Disorganized;Flight of ideas  Content Confabulation;Delusions;Preoccupation;Paranoia  Delusions Controlled;Paranoid;Persecutory  Perception Derealization;Hallucinations  Hallucination None reported or observed;Auditory  Judgment Poor  Confusion Moderate  Danger to Self  Current suicidal ideation? Denies  Danger to Others  Danger to Others None reported or observed

## 2020-02-01 NOTE — Progress Notes (Signed)
[facility] MD Progress Note  02/01/2020 12:44 PM [name]  MRN:  [id number]   Subjective:  Patient seen for follow-up for Bipolar Affective Disorder.  Patient reports that she is doing better today and she is focused on being discharged home.  She continues stating that she is feeling better and she took her injection and she needs to discharge.  When attempting to discuss with patient the plan after discharge she started stating that she will take her medications when she feels she needs them because she got the injection and that her mom provides her better care than she gets from here and that her mother is the doctor that she sees.  She does deny any suicidal homicidal ideations and denies any hallucinations.  Patient reported sleeping well last night and states that she has been getting up and going to meals.  Principal Problem: Bipolar affective disorder ([place]) Diagnosis: Principal Problem:   Bipolar affective disorder ([place])  Total Time spent with patient: 20 minutes  Past Psychiatric History: Patient has previously been diagnosed with schizoaffective disorder; bipolar type as well as bipolar disorder.  She has had at least 6 or 7 psychiatric hospitalizations in the past.  Her last psychiatric hospitalization at our facility was on 11/29/2018.  It does appear that she has been noncompliant with medications as well as treatment since her last hospitalization.  Her medications on discharge from the hospitalization in 2020 included Cogentin, Tegretol, Haldol as well as Haldol Decanoate 100 mg IM  Past Medical History:  Past Medical History:  Diagnosis Date  . Bipolar 1 disorder ([place]) 2011  . Paranoia ([place])   . Schizoaffective disorder ([place])    History reviewed. No pertinent surgical history. Family History:  Family History  Problem Relation Age of Onset  . Cancer Father        lymphoma  . Hypertension Mother   . Diabetes Neg Hx   . Heart disease Neg Hx    Family Psychiatric  History:  Although currently disorganized the patient reported no family history of psychiatric illness Social History:  Social History   Substance and Sexual Activity  Alcohol Use No   Comment: Pt denies      Social History   Substance and Sexual Activity  Drug Use No   Comment: Pt denies    Social History   Socioeconomic History  . Marital status: Single    Spouse name: Not on file  . Number of children: 0  . Years of education: some colle  . Highest education level: Not on file  Occupational History  . Occupation: [employer]     Comment: trying to start   Tobacco Use  . Smoking status: Never Smoker  . Smokeless tobacco: Never Used  Substance and Sexual Activity  . Alcohol use: No    Comment: Pt denies   . Drug use: No    Comment: Pt denies  . Sexual activity: Not Currently    Birth control/protection: Abstinence, None  Other Topics Concern  . Not on file  Social History Narrative   Work or School: not working   Home Situation: lives with mother   Spiritual Beliefs: Christian   Lifestyle: no regular exercise; healthy diet         Social Determinants of Health   Financial Resource Strain:   . Difficulty of Paying Living Expenses:   Food Insecurity:   . Worried About Charity fundraiser in the Last Year:   . [employer] of  Food in the Last Year:   Transportation Needs:   . Freight forwarder (Medical):   Marland Kitchen Lack of Transportation (Non-Medical):   Physical Activity:   . Days of Exercise per Week:   . Minutes of Exercise per Session:   Stress:   . Feeling of Stress :   Social Connections:   . Frequency of Communication with Friends and Family:   . Frequency of Social Gatherings with Friends and Family:   . Attends Religious Services:   . Active Member of Clubs or Organizations:   . Attends Banker Meetings:   Marland Kitchen Marital Status:    Additional Social History:                         Sleep: Good  Appetite:  Good  Current  Medications: Current Facility-Administered Medications  Medication Dose Route Frequency Provider Last Rate Last Admin  . acetaminophen (TYLENOL) tablet 650 mg  650 mg Oral Q6H PRN Chales Abrahams, NP      . alum & mag hydroxide-simeth (MAALOX/MYLANTA) 200-200-20 MG/5ML suspension 30 mL  30 mL Oral Q4H PRN Ophelia Shoulder E, NP      . benztropine (COGENTIN) tablet 0.5 mg  0.5 mg Oral BID Ophelia Shoulder E, NP   0.5 mg at 02/01/20 6803   Or  . benztropine mesylate (COGENTIN) injection 0.5 mg  0.5 mg Intramuscular BID Ophelia Shoulder E, NP      . carbamazepine (TEGRETOL) tablet 200 mg  200 mg Oral BID Ophelia Shoulder E, NP   200 mg at 02/01/20 2122  . haloperidol (HALDOL) tablet 10 mg  10 mg Oral BID Antonieta Pert, MD   10 mg at 02/01/20 4825   Or  . haloperidol lactate (HALDOL) injection 10 mg  10 mg Intramuscular BID Antonieta Pert, MD      . Melene Muller ON 02/28/2020] haloperidol decanoate (HALDOL DECANOATE) 100 MG/ML injection 100 mg  100 mg Intramuscular Q30 days Lenka Zhao, Gerlene Burdock, FNP        Lab Results: No results found for this or any previous visit (from the past 48 hour(s)).  Blood Alcohol level:  Lab Results  Component Value Date   ETH <10 01/26/2020   ETH <10 11/27/2018    Metabolic Disorder Labs: Lab Results  Component Value Date   HGBA1C 5.5 11/02/2017   MPG 111.15 11/02/2017   No results found for: PROLACTIN Lab Results  Component Value Date   CHOL 179 07/27/2014   TRIG 49 07/27/2014   HDL 44 07/27/2014   CHOLHDL 5 06/21/2014   VLDL 10 07/27/2014   LDLCALC 125 (H) 07/27/2014   LDLCALC 100 (H) 06/21/2014    Physical Findings: AIMS: Facial and Oral Movements Muscles of Facial Expression: None, normal Lips and Perioral Area: None, normal Jaw: None, normal Tongue: None, normal,Extremity Movements Upper (arms, wrists, hands, fingers): None, normal Lower (legs, knees, ankles, toes): None, normal, Trunk Movements Neck, shoulders, hips: None, normal, Overall  Severity Severity of abnormal movements (highest score from questions above): None, normal Incapacitation due to abnormal movements: None, normal Patient's awareness of abnormal movements (rate only patient's report): No Awareness, Dental Status Current problems with teeth and/or dentures?: No Does patient usually wear dentures?: No  CIWA:    COWS:     Musculoskeletal: Strength & Muscle Tone: within normal limits Gait & Station: normal Patient leans: N/A  Psychiatric Specialty Exam: Physical Exam  Nursing note and vitals reviewed. Constitutional: She is  oriented to person, place, and time. She appears well-developed and well-nourished.  Cardiovascular: Normal rate.  Respiratory: Effort normal.  Musculoskeletal:        General: Normal range of motion.  Neurological: She is alert and oriented to person, place, and time.  Skin: Skin is warm.  Psychiatric: Her mood appears anxious.    Review of Systems  Constitutional: Negative.   HENT: Negative.   Eyes: Negative.   Respiratory: Negative.   Cardiovascular: Negative.   Gastrointestinal: Negative.   Genitourinary: Negative.   Musculoskeletal: Negative.   Skin: Negative.   Neurological: Negative.   Psychiatric/Behavioral: Negative.     Blood pressure 101/84, pulse 83, temperature 98.8 F (37.1 C), temperature source Oral, resp. rate 20, height 5\' 3"  (1.6 m), weight 60.8 kg, SpO2 100 %.Body mass index is 23.74 kg/m.  General Appearance: Disheveled  Eye Contact:  Fair  Speech:  Clear and Coherent and Normal Rate  Volume:  Decreased  Mood:  Anxious  Affect:  Constricted  Thought Process:  Goal Directed and Descriptions of Associations: Loose  Orientation:  Full (Time, Place, and Person)  Thought Content:  Illogical, Rumination and Tangential  Suicidal Thoughts:  No  Homicidal Thoughts:  No  Memory:  Immediate;   Fair Recent;   Fair Remote;   Fair  Judgement:  Impaired  Insight:  Lacking  Psychomotor Activity:  Normal   Concentration:  Concentration: Fair  Recall:  of Knowledge:  Fair  Language:  Fair  Akathisia:  No  Handed:  Right  AIMS (if indicated):     Assets:  Desire for Improvement Financial Resources/Insurance  ADL's:  Intact  Cognition:  WNL  Sleep:  Number of Hours: 9.75   Assessment: Patient presents in her room and is pleasant, calm, and cooperative.  Patient still appears to be disheveled and has very little insight into her treatment.  Also feeling that her mother is a doctor because her mother provides her the medications when she is home she was little understanding to her follow-up treatment with her providers when she discharges.  Patient has shown improvement but feels that she will still need 2-3 more days for stability to ensure appropriate treatment and to reduce her risk of quick turnaround and return to the hospital.  We will continue all medications as currently prescribed.  Patient slept 9.75 hours last night.  Patient vitals are WNL.  No new labs.  Plan to order Tegretol level in the next 1 to 2 days.  Treatment Plan Summary: Daily contact with patient to assess and evaluate symptoms and progress in treatment and Medication management Continue Cogentin 0.5 mg p.o. or IM twice daily for EPS Continue Tegretol 200 mg p.o. twice daily for mood stability Continue Haldol 10 mg p.o. or IM twice daily for bipolar affective disorder Continue Haldol Decanoate 100 mg IM every 30 days with next dose due on 02/28/2020 Encourage group therapy participation Continue every 15 minute safety checks  04/30/2020, FNP 02/01/2020, 12:44 PM

## 2020-02-01 NOTE — [facility] Counselor (Signed)
CSW spoke with mother, Saman Giddens [phone]). Mother reports that she notes some improvement in patient when they spoke on the phone this morning, but patient continues to present with some delusional thoughts. Mother confirmed for CSW that she is not a physician, as patient told care team this morning.  Mother reports she feels comfortable with patient discharging within the next 24-48 hours for additional stabilization.  Enid Cutter, MSW, LCSW-A Clinical Social Worker

## 2020-02-01 NOTE — Progress Notes (Signed)
   02/01/20 0900  Psych Admission Type (Psych Patients Only)  Admission Status Involuntary  Psychosocial Assessment  Patient Complaints Suspiciousness  Eye Contact Fair  Facial Expression Anxious;Pensive;Worried  Affect Anxious;Preoccupied  Furniture conservator/restorer;Restless;Unsteady  Appearance/Hygiene Improved  Behavior Characteristics Appropriate to situation  Mood Suspicious;Preoccupied  Thought Process  Coherency Disorganized;Flight of ideas  Content Confabulation;Delusions;Preoccupation;Paranoia  Delusions Controlled;Paranoid;Persecutory  Perception Derealization;Hallucinations  Hallucination None reported or observed;Auditory  Judgment Poor  Confusion Moderate  Danger to Self  Current suicidal ideation? Denies  Danger to Others  Danger to Others None reported or observed

## 2020-02-02 MED ORDER — TRAZODONE HCL 50 MG PO TABS
50.0000 mg | ORAL_TABLET | Freq: Every evening | ORAL | Status: DC | PRN
  Administered 2020-02-02: 50 mg via ORAL
  Filled 2020-02-02 (×4): qty 1

## 2020-02-02 MED ORDER — CARBAMAZEPINE 100 MG PO CHEW
300.0000 mg | CHEWABLE_TABLET | Freq: Two times a day (BID) | ORAL | Status: DC
  Administered 2020-02-02 – 2020-02-08 (×12): 300 mg via ORAL
  Filled 2020-02-02 (×18): qty 3

## 2020-02-02 MED ORDER — HALOPERIDOL 5 MG PO TABS
10.0000 mg | ORAL_TABLET | Freq: Every day | ORAL | Status: DC
  Administered 2020-02-02: 10 mg via ORAL

## 2020-02-02 MED ORDER — HALOPERIDOL LACTATE 5 MG/ML IJ SOLN: 10.0000 mg | Freq: Every day | INTRAMUSCULAR | Status: DC

## 2020-02-02 MED ORDER — HALOPERIDOL 5 MG PO TABS
15.0000 mg | ORAL_TABLET | Freq: Two times a day (BID) | ORAL | Status: DC
  Administered 2020-02-02 – 2020-02-03 (×2): 15 mg via ORAL
  Filled 2020-02-02 (×6): qty 3

## 2020-02-02 MED ORDER — HALOPERIDOL LACTATE 5 MG/ML IJ SOLN
15.0000 mg | Freq: Two times a day (BID) | INTRAMUSCULAR | Status: DC
  Filled 2020-02-02 (×6): qty 3

## 2020-02-02 MED ORDER — HALOPERIDOL 5 MG PO TABS: 15.0000 mg | ORAL_TABLET | Freq: Every day | ORAL | Status: DC

## 2020-02-02 MED ORDER — HALOPERIDOL LACTATE 5 MG/ML IJ SOLN: 15.0000 mg | Freq: Every day | INTRAMUSCULAR | Status: DC

## 2020-02-02 NOTE — Progress Notes (Signed)
Pt kept to herself much of the evening. Pt was cooperative when getting medication, pt appeared to be responding to internal stimuli at times.     02/02/20 2200  Psych Admission Type (Psych Patients Only)  Admission Status Involuntary  Psychosocial Assessment  Patient Complaints Anxiety  Eye Contact Fair  Facial Expression Anxious;Pensive;Worried  Affect Anxious;Preoccupied  Furniture conservator/restorer;Restless;Unsteady  Appearance/Hygiene Improved  Behavior Characteristics Anxious  Mood Suspicious;Labile  Thought Process  Coherency Disorganized;Flight of ideas  Content Confabulation;Delusions;Preoccupation;Paranoia  Delusions Controlled;Paranoid;Persecutory  Perception Derealization;Hallucinations  Hallucination None reported or observed;Auditory  Judgment Poor  Confusion Moderate  Danger to Self  Current suicidal ideation? Denies  Danger to Others  Danger to Others None reported or observed

## 2020-02-02 NOTE — Progress Notes (Signed)
[facility] Second Physician Opinion Progress Note for Medication Administration to Non-consenting Patients (For Involuntarily Committed Patients)  Patient: [name] Date of Birth: [birth year] MRN: [id number]  Reason for the Medication: There is, without the benefit of the specific treatment measure, a significant possibility that the patient will harm self or others before improvement of the patient's condition is realized.  Consideration of Side Effects: Consideration of the side effects related to the medication plan has been given.  Rationale for Medication Administration: Patient is a 34 year old female with a past psychiatric history significant for schizoaffective disorder; bipolar type versus bipolar disorder type I with psychotic features who was admitted on 01/29/2020 with worsening paranoid delusions.  Patient has expressed significant paranoia to the way that the Haldol pill appears, and it is felt that she is often very difficult to take her oral medication and without the possibility of forced medication she will deteriorate.   Antonieta Pert, MD 02/02/20  1:18 PM   This documentation is good for (7) seven days from the date of the MD signature. New documentation must be completed every seven (7) days with detailed justification in the medical record if the patient requires continued non-emergent administration of psychotropic medications.

## 2020-02-02 NOTE — Progress Notes (Signed)
[facility] MD Progress Note  02/02/2020 10:57 AM [name]  MRN:  [id number] Subjective: Patient is a 34 year old female with a past psychiatric history significant for schizoaffective disorder; bipolar type versus bipolar disorder type I who presented to the [facility] emergency department on 01/27/2020 with mania.  Objective: Patient is seen and examined.  Patient is a 34 year old female with the above-stated past psychiatric history who is seen in follow-up.  Since I saw her on admission she continues on Haldol 10 mg p.o. or IM twice daily.  It does not appear as though she has received a Haldol Decanoate dosage.  Additionally her Tegretol is still at 200 mg p.o. twice daily.  The patient still appears to be significantly paranoid.  She is looking over her shoulder during walking up and down the hallway.  When I find her later in the morning she is rolled up in a fetal position facing the window.  Her only concern right now is to go home.  Progress note from yesterday stated that she was feeling better and needed to discharge.  Yesterday and today she denied any auditory or visual hallucinations.  She denied any suicidal or homicidal ideation.  Her vital signs are stable, she is afebrile.  She only slept 5 hours last night.  Review of her admission laboratories showed essentially normal electrolytes, normal CBC, normal differential, drug screen was negative, blood alcohol was negative, beta-hCG was negative.  Principal Problem: Bipolar affective disorder ([place]) Diagnosis: Principal Problem:   Bipolar affective disorder ([place])  Total Time spent with patient: 20 minutes  Past Psychiatric History: See admission H&P  Past Medical History:  Past Medical History:  Diagnosis Date  . Bipolar 1 disorder ([place]) 2011  . Paranoia ([place])   . Schizoaffective disorder ([place])    History reviewed. No pertinent surgical history. Family History:  Family History  Problem Relation Age of Onset  .  Cancer Father        lymphoma  . Hypertension Mother   . Diabetes Neg Hx   . Heart disease Neg Hx    Family Psychiatric  History: See admission H&P Social History:  Social History   Substance and Sexual Activity  Alcohol Use No   Comment: Pt denies      Social History   Substance and Sexual Activity  Drug Use No   Comment: Pt denies    Social History   Socioeconomic History  . Marital status: Single    Spouse name: Not on file  . Number of children: 0  . Years of education: some colle  . Highest education level: Not on file  Occupational History  . Occupation: [employer]     Comment: trying to start   Tobacco Use  . Smoking status: Never Smoker  . Smokeless tobacco: Never Used  Vaping Use  . Vaping Use: Never used  Substance and Sexual Activity  . Alcohol use: No    Comment: Pt denies   . Drug use: No    Comment: Pt denies  . Sexual activity: Not Currently    Birth control/protection: Abstinence, None  Other Topics Concern  . Not on file  Social History Narrative   Work or School: not working   Home Situation: lives with mother   Spiritual Beliefs: Christian   Lifestyle: no regular exercise; healthy diet         Social Determinants of Health   Financial Resource Strain:   . Difficulty of Paying Living Expenses:  Food Insecurity:   . Worried About Programme researcher, broadcasting/film/video in the Last Year:   . Barista in the Last Year:   Transportation Needs:   . Freight forwarder (Medical):   Marland Kitchen Lack of Transportation (Non-Medical):   Physical Activity:   . Days of Exercise per Week:   . Minutes of Exercise per Session:   Stress:   . Feeling of Stress :   Social Connections:   . Frequency of Communication with Friends and Family:   . Frequency of Social Gatherings with Friends and Family:   . Attends Religious Services:   . Active Member of Clubs or Organizations:   . Attends Banker Meetings:   Marland Kitchen Marital Status:    Additional Social  History:                         Sleep: Fair  Appetite:  Fair  Current Medications: Current Facility-Administered Medications  Medication Dose Route Frequency Provider Last Rate Last Admin  . acetaminophen (TYLENOL) tablet 650 mg  650 mg Oral Q6H PRN Chales Abrahams, NP      . alum & mag hydroxide-simeth (MAALOX/MYLANTA) 200-200-20 MG/5ML suspension 30 mL  30 mL Oral Q4H PRN Ophelia Shoulder E, NP      . benztropine (COGENTIN) tablet 0.5 mg  0.5 mg Oral BID Ophelia Shoulder E, NP   0.5 mg at 02/01/20 1636   Or  . benztropine mesylate (COGENTIN) injection 0.5 mg  0.5 mg Intramuscular BID Ophelia Shoulder E, NP      . carbamazepine (TEGRETOL) tablet 200 mg  200 mg Oral BID Ophelia Shoulder E, NP   200 mg at 02/01/20 1636  . haloperidol (HALDOL) tablet 10 mg  10 mg Oral BID Antonieta Pert, MD   10 mg at 02/01/20 1635   Or  . haloperidol lactate (HALDOL) injection 10 mg  10 mg Intramuscular BID Antonieta Pert, MD      . Melene Muller ON 02/28/2020] haloperidol decanoate (HALDOL DECANOATE) 100 MG/ML injection 100 mg  100 mg Intramuscular Q30 days Money, Gerlene Burdock, FNP        Lab Results: No results found for this or any previous visit (from the past 48 hour(s)).  Blood Alcohol level:  Lab Results  Component Value Date   ETH <10 01/26/2020   ETH <10 11/27/2018    Metabolic Disorder Labs: Lab Results  Component Value Date   HGBA1C 5.5 11/02/2017   MPG 111.15 11/02/2017   No results found for: PROLACTIN Lab Results  Component Value Date   CHOL 179 07/27/2014   TRIG 49 07/27/2014   HDL 44 07/27/2014   CHOLHDL 5 06/21/2014   VLDL 10 07/27/2014   LDLCALC 125 (H) 07/27/2014   LDLCALC 100 (H) 06/21/2014    Physical Findings: AIMS: Facial and Oral Movements Muscles of Facial Expression: None, normal Lips and Perioral Area: None, normal Jaw: None, normal Tongue: None, normal,Extremity Movements Upper (arms, wrists, hands, fingers): None, normal Lower (legs, knees, ankles, toes):  None, normal, Trunk Movements Neck, shoulders, hips: None, normal, Overall Severity Severity of abnormal movements (highest score from questions above): None, normal Incapacitation due to abnormal movements: None, normal Patient's awareness of abnormal movements (rate only patient's report): No Awareness, Dental Status Current problems with teeth and/or dentures?: No Does patient usually wear dentures?: No  CIWA:    COWS:     Musculoskeletal: Strength & Muscle Tone: within normal limits Gait &  Station: normal Patient leans: N/A  Psychiatric Specialty Exam: Physical Exam  Nursing note and vitals reviewed. Constitutional: She is oriented to person, place, and time.  HENT:  Head: Normocephalic and atraumatic.  Respiratory: Effort normal.  GI: Normal appearance.  Neurological: She is alert and oriented to person, place, and time.    Review of Systems  Blood pressure 105/72, pulse (!) 102, temperature 98.4 F (36.9 C), temperature source Oral, resp. rate 20, height 5\' 3"  (1.6 m), weight 60.8 kg, SpO2 100 %.Body mass index is 23.74 kg/m.  General Appearance: Disheveled  Eye Contact:  Fair  Speech:  Normal Rate  Volume:  Decreased  Mood:  Dysphoric  Affect:  Congruent  Thought Process:  Coherent and Descriptions of Associations: Circumstantial  Orientation:  Full (Time, Place, and Person)  Thought Content:  Delusions and Paranoid Ideation  Suicidal Thoughts:  No  Homicidal Thoughts:  No  Memory:  Immediate;   Fair Recent;   Fair Remote;   Fair  Judgement:  Impaired  Insight:  Fair  Psychomotor Activity:  Decreased  Concentration:  Concentration: Fair and Attention Span: Fair  Recall:  of Knowledge:  Fair  Language:  Fair  Akathisia:  Negative  Handed:  Right  AIMS (if indicated):     Assets:  Desire for Improvement Resilience  ADL's:  Intact  Cognition:  WNL  Sleep:  Number of Hours: 5     Treatment Plan Summary: Daily contact with patient to assess  and evaluate symptoms and progress in treatment, Medication management and Plan : Patient is seen and examined.  Patient is a 34 year old female with the above-stated past psychiatric history who is seen in follow-up.   Diagnosis: 1.  Schizoaffective disorder; bipolar type  Pertinent findings on examination today: 1.  Continued paranoia and delusional thinking. 2.  Denied any suicidal or homicidal ideation. 3.  Denied any auditory or visual hallucinations. 4.  Poor sleep  Plan: 1.  Continue Tegretol 200 mg p.o. twice daily for mood stability. 2.  Continue haloperidol 10 mg p.o. or IM twice daily for psychosis. 3.  Patient received a Haldol Decanoate 100 mg/mL injection on the date of admission. 4.  Will order CBC with differential, LFTs and Tegretol level in a.m. tomorrow. 5.  Add trazodone 50 mg p.o. nightly as needed insomnia secondary to poor sleep. 6.  Disposition planning-in progress.   20, MD 02/02/2020, 10:57 AM

## 2020-02-02 NOTE — Progress Notes (Signed)
[facility] Second Physician Opinion Progress Note for Medication Administration to Non-consenting Patients (For Involuntarily Committed Patients)  Patient: [name] Date of Birth: [birth year] MRN: [id number]  Reason for the Medication: There is, without the benefit of the specific treatment measure, a significant possibility that the patient will harm self or others before improvement of the patient's condition is realized.  Consideration of Side Effects: Consideration of the side effects related to the medication plan has been given.  Rationale for Medication Administration:  Asked by Dr. Jola Babinski, Attending Psychiatrist, to evaluate patient for medication administration to non consenting patient. 47 y old female, history of schizoaffective disorder, presented with disorganized, manic symptoms.  On unit has presented paranoid, disorganized, guarded, limited interaction with peers/staff.     Craige Cotta, MD 02/02/20  1:47 PM   This documentation is good for (7) seven days from the date of the MD signature. New documentation must be completed every seven (7) days with detailed justification in the medical record if the patient requires continued non-emergent administration of psychotropic medications.

## 2020-02-03 LAB — CBC WITH DIFFERENTIAL/PLATELET
Abs Immature Granulocytes: 0 10*3/uL (ref 0.00–0.07)
Basophils Absolute: 0.1 10*3/uL (ref 0.0–0.1)
Basophils Relative: 1 %
Eosinophils Absolute: 0.2 10*3/uL (ref 0.0–0.5)
Eosinophils Relative: 4 %
HCT: 38.1 % (ref 36.0–46.0)
Hemoglobin: 12.3 g/dL (ref 12.0–15.0)
Immature Granulocytes: 0 %
Lymphocytes Relative: 37 %
Lymphs Abs: 1.6 10*3/uL (ref 0.7–4.0)
MCH: 27.4 pg (ref 26.0–34.0)
MCHC: 32.3 g/dL (ref 30.0–36.0)
MCV: 84.9 fL (ref 80.0–100.0)
Monocytes Absolute: 0.4 10*3/uL (ref 0.1–1.0)
Monocytes Relative: 10 %
Neutro Abs: 2.1 10*3/uL (ref 1.7–7.7)
Neutrophils Relative %: 48 %
Platelets: 209 10*3/uL (ref 150–400)
RBC: 4.49 MIL/uL (ref 3.87–5.11)
RDW: 14 % (ref 11.5–15.5)
WBC: 4.3 10*3/uL (ref 4.0–10.5)
nRBC: 0 % (ref 0.0–0.2)

## 2020-02-03 LAB — HEPATIC FUNCTION PANEL
ALT: 14 U/L (ref 0–44)
AST: 15 U/L (ref 15–41)
Albumin: 3.6 g/dL (ref 3.5–5.0)
Alkaline Phosphatase: 43 U/L (ref 38–126)
Bilirubin, Direct: <0.1 mg/dL (ref 0.0–0.2)
Total Bilirubin: 0.5 mg/dL (ref 0.3–1.2)
Total Protein: 6.8 g/dL (ref 6.5–8.1)

## 2020-02-03 LAB — CARBAMAZEPINE LEVEL, TOTAL: Carbamazepine Lvl: 8.4 ug/mL (ref 4.0–12.0)

## 2020-02-03 MED ORDER — RISPERIDONE 2 MG PO TBDP
2.0000 mg | ORAL_TABLET | Freq: Two times a day (BID) | ORAL | Status: DC
  Administered 2020-02-03 – 2020-02-06 (×6): 2 mg via ORAL
  Filled 2020-02-03 (×9): qty 1

## 2020-02-03 MED ORDER — LORAZEPAM 1 MG PO TABS: 2.0000 mg | ORAL_TABLET | Freq: Four times a day (QID) | ORAL | Status: DC | PRN

## 2020-02-03 MED ORDER — HALOPERIDOL LACTATE 5 MG/ML IJ SOLN
10.0000 mg | Freq: Three times a day (TID) | INTRAMUSCULAR | Status: DC
  Filled 2020-02-03 (×8): qty 2

## 2020-02-03 MED ORDER — LORAZEPAM 2 MG/ML IJ SOLN: 2.0000 mg | Freq: Four times a day (QID) | INTRAMUSCULAR | Status: DC | PRN

## 2020-02-03 MED ORDER — HALOPERIDOL 5 MG PO TABS
10.0000 mg | ORAL_TABLET | Freq: Three times a day (TID) | ORAL | Status: DC
  Administered 2020-02-03 – 2020-02-04 (×3): 10 mg via ORAL
  Filled 2020-02-03 (×8): qty 2

## 2020-02-03 NOTE — Progress Notes (Signed)
   02/03/20 1100  Pain Assessment  Pain Scale 0-10  Pain Score 0

## 2020-02-03 NOTE — Progress Notes (Signed)
Pt continues to be paranoid and respond to internal stimuli     02/03/20 2000  Psych Admission Type (Psych Patients Only)  Admission Status Involuntary  Psychosocial Assessment  Patient Complaints Anxiety  Eye Contact Fair  Facial Expression Anxious;Pensive;Worried  Affect Anxious;Preoccupied  Furniture conservator/restorer;Restless;Unsteady  Appearance/Hygiene Improved  Behavior Characteristics Unwilling to participate  Mood Suspicious  Thought Process  Coherency Disorganized;Flight of ideas  Content Confabulation;Delusions;Preoccupation;Paranoia  Delusions Controlled;Paranoid;Persecutory  Perception Derealization;Hallucinations  Hallucination None reported or observed;Auditory  Judgment Poor  Confusion Moderate  Danger to Self  Current suicidal ideation? Denies  Danger to Others  Danger to Others None reported or observed

## 2020-02-03 NOTE — Progress Notes (Signed)
   02/03/20 1200  Psych Admission Type (Psych Patients Only)  Admission Status Involuntary  Psychosocial Assessment  Patient Complaints Irritability  Eye Contact Fair  Facial Expression Anxious;Pensive;Worried  Affect Anxious;Preoccupied  Furniture conservator/restorer;Restless;Unsteady  Appearance/Hygiene Improved  Behavior Characteristics Unwilling to participate  Mood Suspicious;Labile  Thought Process  Coherency Disorganized;Flight of ideas  Content Confabulation;Delusions;Preoccupation;Paranoia  Delusions Controlled;Paranoid;Persecutory  Perception Derealization;Hallucinations  Hallucination None reported or observed;Auditory  Judgment Poor  Confusion Moderate  Danger to Self  Current suicidal ideation? Denies  Danger to Others  Danger to Others None reported or observed

## 2020-02-03 NOTE — Tx Team (Signed)
Interdisciplinary Treatment and Diagnostic Plan Update  02/03/2020 Time of Session: 9:25am [name] MRN: [id number]  Principal Diagnosis: Bipolar affective disorder [facility])  Secondary Diagnoses: Principal Problem:   Bipolar affective disorder ([place])   Current Medications:  Current Facility-Administered Medications  Medication Dose Route Frequency Provider Last Rate Last Admin  . acetaminophen (TYLENOL) tablet 650 mg  650 mg Oral Q6H PRN Mallie Darting, NP      . alum & mag hydroxide-simeth (MAALOX/MYLANTA) 200-200-20 MG/5ML suspension 30 mL  30 mL Oral Q4H PRN Merlyn Lot E, NP      . benztropine (COGENTIN) tablet 0.5 mg  0.5 mg Oral BID Merlyn Lot E, NP   0.5 mg at 02/03/20 4888   Or  . benztropine mesylate (COGENTIN) injection 0.5 mg  0.5 mg Intramuscular BID Merlyn Lot E, NP      . carbamazepine (TEGRETOL) chewable tablet 300 mg  300 mg Oral BID Sharma Covert, MD   300 mg at 02/03/20 9169  . haloperidol lactate (HALDOL) injection 15 mg  15 mg Intramuscular BID Sharma Covert, MD       Or  . haloperidol (HALDOL) tablet 15 mg  15 mg Oral BID Sharma Covert, MD   15 mg at 02/03/20 0819  . LORazepam (ATIVAN) tablet 2 mg  2 mg Oral Q6H PRN Sharma Covert, MD       Or  . LORazepam (ATIVAN) injection 2 mg  2 mg Intramuscular Q6H PRN Sharma Covert, MD      . risperiDONE (RISPERDAL M-TABS) disintegrating tablet 2 mg  2 mg Oral BID Sharma Covert, MD      . traZODone (DESYREL) tablet 50 mg  50 mg Oral QHS PRN Sharma Covert, MD   50 mg at 02/02/20 2103   PTA Medications: Medications Prior to Admission  Medication Sig Dispense Refill Last Dose  . benztropine (COGENTIN) 1 MG tablet Take 1 tablet (1 mg total) by mouth 2 (two) times daily. (Patient not taking: Reported on 09/13/2019) 60 tablet 1   . carbamazepine (TEGRETOL) 200 MG tablet Take 1 tablet (200 mg total) by mouth 2 (two) times daily after a meal. (Patient not taking: Reported on 09/13/2019) 60  tablet 2   . haloperidol (HALDOL) 10 MG tablet Take 1 tablet (10 mg total) by mouth at bedtime. (Patient not taking: Reported on 09/13/2019) 90 tablet 1   . haloperidol decanoate (HALDOL DECANOATE) 100 MG/ML injection Inject 1 mL (100 mg total) into the muscle every 28 (twenty-eight) days. Next due 12/28/18 (Patient not taking: Reported on 09/13/2019) 1 mL 11   . Multiple Vitamin (MULTIVITAMIN ADULT) TABS Take 1 tablet by mouth daily.     Marland Kitchen OVER THE COUNTER MEDICATION Take 1 tablet by mouth daily. Calm       Patient Stressors: Financial difficulties Medication change or noncompliance  Patient Strengths: Physical Health Religious Affiliation Supportive family/friends  Treatment Modalities: Medication Management, Group therapy, Case management,  1 to 1 session with clinician, Psychoeducation, Recreational therapy.   Physician Treatment Plan for Primary Diagnosis: Bipolar affective disorder ([place]) Long Term Goal(s): Improvement in symptoms so as ready for discharge Improvement in symptoms so as ready for discharge   Short Term Goals: Ability to identify changes in lifestyle to reduce recurrence of condition will improve Ability to verbalize feelings will improve Ability to demonstrate self-control will improve Ability to identify and develop effective coping behaviors will improve Ability to maintain clinical measurements within normal limits will improve  Compliance with prescribed medications will improve Ability to identify changes in lifestyle to reduce recurrence of condition will improve Ability to verbalize feelings will improve Ability to demonstrate self-control will improve Ability to identify and develop effective coping behaviors will improve Ability to maintain clinical measurements within normal limits will improve Compliance with prescribed medications will improve  Medication Management: Evaluate patient's response, side effects, and tolerance of medication  regimen.  Therapeutic Interventions: 1 to 1 sessions, Unit Group sessions and Medication administration.  Evaluation of Outcomes: Not Met  Physician Treatment Plan for Secondary Diagnosis: Principal Problem:   Bipolar affective disorder ([place])  Long Term Goal(s): Improvement in symptoms so as ready for discharge Improvement in symptoms so as ready for discharge   Short Term Goals: Ability to identify changes in lifestyle to reduce recurrence of condition will improve Ability to verbalize feelings will improve Ability to demonstrate self-control will improve Ability to identify and develop effective coping behaviors will improve Ability to maintain clinical measurements within normal limits will improve Compliance with prescribed medications will improve Ability to identify changes in lifestyle to reduce recurrence of condition will improve Ability to verbalize feelings will improve Ability to demonstrate self-control will improve Ability to identify and develop effective coping behaviors will improve Ability to maintain clinical measurements within normal limits will improve Compliance with prescribed medications will improve     Medication Management: Evaluate patient's response, side effects, and tolerance of medication regimen.  Therapeutic Interventions: 1 to 1 sessions, Unit Group sessions and Medication administration.  Evaluation of Outcomes: Not Met   RN Treatment Plan for Primary Diagnosis: Bipolar affective disorder ([place]) Long Term Goal(s): Knowledge of disease and therapeutic regimen to maintain health will improve  Short Term Goals: Ability to remain free from injury will improve, Ability to participate in decision making will improve, Ability to identify and develop effective coping behaviors will improve and Compliance with prescribed medications will improve  Medication Management: RN will administer medications as ordered by provider, will assess and evaluate  patient's response and provide education to patient for prescribed medication. RN will report any adverse and/or side effects to prescribing provider.  Therapeutic Interventions: 1 on 1 counseling sessions, Psychoeducation, Medication administration, Evaluate responses to treatment, Monitor vital signs and CBGs as ordered, Perform/monitor CIWA, COWS, AIMS and Fall Risk screenings as ordered, Perform wound care treatments as ordered.  Evaluation of Outcomes: Not Met   LCSW Treatment Plan for Primary Diagnosis: Bipolar affective disorder ([place]) Long Term Goal(s): Safe transition to appropriate next level of care at discharge, Engage patient in therapeutic group addressing interpersonal concerns.  Short Term Goals: Engage patient in aftercare planning with referrals and resources, Increase social support, Identify triggers associated with mental health/substance abuse issues and Increase skills for wellness and recovery  Therapeutic Interventions: Assess for all discharge needs, 1 to 1 time with Social worker, Explore available resources and support systems, Assess for adequacy in community support network, Educate family and significant other(s) on suicide prevention, Complete Psychosocial Assessment, Interpersonal group therapy.  Evaluation of Outcomes: Not Met   Progress in Treatment: Attending groups: No. Participating in groups: No. Taking medication as prescribed: Yes. Toleration medication: Yes. Family/Significant other contact made: Yes, individual(s) contacted:  mother, [name]. Patient understands diagnosis: Yes. Discussing patient identified problems/goals with staff: Yes. Medical problems stabilized or resolved: No. Denies suicidal/homicidal ideation: Yes. Issues/concerns per patient self-inventory: No. Other: none   New problem(s) identified: None  New Short Term/Long Term Goal(s):  detox, medication management for mood  stabilization; elimination of SI thoughts;  development of comprehensive mental wellness/sobriety plan  Patient Goals:  "Make it out of here properly"  Discharge Plan or Barriers: Plans to receive services through [facility] once discharged. Plans to return home to live with mother.  Reason for Continuation of Hospitalization: Delusions  Hallucinations Mania Medication stabilization  Estimated Length of Stay: 3-5 days   Attendees: Patient:  02/03/2020   Physician: Myles Lipps, MD 02/03/2020   Nursing:  02/03/2020   RN Care Manager: 02/03/2020  Social Worker: Darletta Moll, [place]  02/03/2020  Recreational Therapist:  02/03/2020   Other:  02/03/2020   Other:  02/03/2020  Other: 02/03/2020     Scribe for Treatment Team: Vassie Moselle, [place] 02/03/2020 10:15 AM

## 2020-02-03 NOTE — Progress Notes (Signed)
[facility] MD Progress Note  02/03/2020 10:41 AM [name]  MRN:  [id number] Subjective:  Patient is a 34 year old female with a past psychiatric history significant for schizoaffective disorder; bipolar type versus bipolar disorder type I who presented to the [facility] emergency department on 01/27/2020 with mania.  Objective: Patient is seen and examined. Patient is a 34 year old female with the above-stated past psychiatric history who is seen in follow-up. She is essentially unchanged from yesterday. She is more irritable, more intrusive and paranoid. She reportedly took her medications last night, but a forced medication protocol was in place. Unfortunately I can hear her on the telephone telling her mother that she is ready to be discharged and that mother needs to come get her. She knocks on my door incessantly trying to get me to talk to her mother on the phone in the hallway, and I told her I would call her mother later today when we could speak in private. Her vital signs are stable, she is afebrile. She reportedly slept 5 hours last night. Repeat liver function enzymes from 6/11 showed normal LFTs. Her CBC was normal.  Principal Problem: Bipolar affective disorder ([place]) Diagnosis: Principal Problem:   Bipolar affective disorder ([place])  Total Time spent with patient: 20 minutes  Past Psychiatric History: See admission H&P  Past Medical History:  Past Medical History:  Diagnosis Date   Bipolar 1 disorder ([place]) 2011   Paranoia ([place])    Schizoaffective disorder ([place])    History reviewed. No pertinent surgical history. Family History:  Family History  Problem Relation Age of Onset   Cancer Father        lymphoma   Hypertension Mother    Diabetes Neg Hx    Heart disease Neg Hx    Family Psychiatric  History: See admission H&P Social History:  Social History   Substance and Sexual Activity  Alcohol Use No   Comment: Pt denies      Social History    Substance and Sexual Activity  Drug Use No   Comment: Pt denies    Social History   Socioeconomic History   Marital status: Single    Spouse name: Not on file   Number of children: 0   Years of education: some colle   Highest education level: Not on file  Occupational History   Occupation: [employer]     Comment: trying to start   Tobacco Use   Smoking status: Never Smoker   Smokeless tobacco: Never Used  Vaping Use   Vaping Use: Never used  Substance and Sexual Activity   Alcohol use: No    Comment: Pt denies    Drug use: No    Comment: Pt denies   Sexual activity: Not Currently    Birth control/protection: Abstinence, None  Other Topics Concern   Not on file  Social History Narrative   Work or School: not working   Home Situation: lives with mother   Spiritual Beliefs: Christian   Lifestyle: no regular exercise; healthy diet         Social Determinants of Health   Financial Resource Strain:    Difficulty of Paying Living Expenses:   Food Insecurity:    Worried About Charity fundraiser in the Last Year:    Arboriculturist in the Last Year:   Transportation Needs:    Film/video editor (Medical):    Lack of Transportation (Non-Medical):   Physical Activity:  Days of Exercise per Week:    Minutes of Exercise per Session:   Stress:    Feeling of Stress :   Social Connections:    Frequency of Communication with Friends and Family:    Frequency of Social Gatherings with Friends and Family:    Attends Religious Services:    Active Member of Clubs or Organizations:    Attends Banker Meetings:    Marital Status:    Additional Social History:                         Sleep: Fair  Appetite:  Fair  Current Medications: Current Facility-Administered Medications  Medication Dose Route Frequency Provider Last Rate Last Admin   acetaminophen (TYLENOL) tablet 650 mg  650 mg Oral Q6H PRN Chales Abrahams,  NP       alum & mag hydroxide-simeth (MAALOX/MYLANTA) 200-200-20 MG/5ML suspension 30 mL  30 mL Oral Q4H PRN Chales Abrahams, NP       benztropine (COGENTIN) tablet 0.5 mg  0.5 mg Oral BID Ophelia Shoulder E, NP   0.5 mg at 02/03/20 2122   Or   benztropine mesylate (COGENTIN) injection 0.5 mg  0.5 mg Intramuscular BID Chales Abrahams, NP       carbamazepine (TEGRETOL) chewable tablet 300 mg  300 mg Oral BID Antonieta Pert, MD   300 mg at 02/03/20 4825   haloperidol lactate (HALDOL) injection 15 mg  15 mg Intramuscular BID Antonieta Pert, MD       Or   haloperidol (HALDOL) tablet 15 mg  15 mg Oral BID Antonieta Pert, MD   15 mg at 02/03/20 0819   LORazepam (ATIVAN) tablet 2 mg  2 mg Oral Q6H PRN Antonieta Pert, MD       Or   LORazepam (ATIVAN) injection 2 mg  2 mg Intramuscular Q6H PRN Antonieta Pert, MD       risperiDONE (RISPERDAL M-TABS) disintegrating tablet 2 mg  2 mg Oral BID Antonieta Pert, MD       traZODone (DESYREL) tablet 50 mg  50 mg Oral QHS PRN Antonieta Pert, MD   50 mg at 02/02/20 2103    Lab Results:  Results for orders placed or performed during the hospital encounter of 01/29/20 (from the past 48 hour(s))  CBC with Differential/Platelet     Status: None   Collection Time: 02/03/20  6:45 AM  Result Value Ref Range   WBC 4.3 4.0 - 10.5 K/uL   RBC 4.49 3.87 - 5.11 MIL/uL   Hemoglobin 12.3 12.0 - 15.0 g/dL   HCT 00.3 36 - 46 %   MCV 84.9 80.0 - 100.0 fL   MCH 27.4 26.0 - 34.0 pg   MCHC 32.3 30.0 - 36.0 g/dL   RDW 70.4 88.8 - 91.6 %   Platelets 209 150 - 400 K/uL   nRBC 0.0 0.0 - 0.2 %   Neutrophils Relative % 48 %   Neutro Abs 2.1 1.7 - 7.7 K/uL   Lymphocytes Relative 37 %   Lymphs Abs 1.6 0.7 - 4.0 K/uL   Monocytes Relative 10 %   Monocytes Absolute 0.4 0 - 1 K/uL   Eosinophils Relative 4 %   Eosinophils Absolute 0.2 0 - 0 K/uL   Basophils Relative 1 %   Basophils Absolute 0.1 0 - 0 K/uL   Immature Granulocytes 0 %   Abs  Immature Granulocytes 0.00  0.00 - 0.07 K/uL    Comment: Performed at [facility], [address] [place], [place] [address]  Hepatic function panel     Status: None   Collection Time: 02/03/20  6:45 AM  Result Value Ref Range   Total Protein 6.8 6.5 - 8.1 g/dL   Albumin 3.6 3.5 - 5.0 g/dL   AST 15 15 - 41 U/L   ALT 14 0 - 44 U/L   Alkaline Phosphatase 43 38 - 126 U/L   Total Bilirubin 0.5 0.3 - 1.2 mg/dL   Bilirubin, Direct <2.7 0.0 - 0.2 mg/dL   Indirect Bilirubin NOT CALCULATED 0.3 - 0.9 mg/dL    Comment: Performed at [facility], [address] [place], [place] [address]    Blood Alcohol level:  Lab Results  Component Value Date   ETH <10 01/26/2020   ETH <10 11/27/2018    Metabolic Disorder Labs: Lab Results  Component Value Date   HGBA1C 5.5 11/02/2017   MPG 111.15 11/02/2017   No results found for: PROLACTIN Lab Results  Component Value Date   CHOL 179 07/27/2014   TRIG 49 07/27/2014   HDL 44 07/27/2014   CHOLHDL 5 06/21/2014   VLDL 10 07/27/2014   LDLCALC 125 (H) 07/27/2014   LDLCALC 100 (H) 06/21/2014    Physical Findings: AIMS: Facial and Oral Movements Muscles of Facial Expression: None, normal Lips and Perioral Area: None, normal Jaw: None, normal Tongue: None, normal,Extremity Movements Upper (arms, wrists, hands, fingers): None, normal Lower (legs, knees, ankles, toes): None, normal, Trunk Movements Neck, shoulders, hips: None, normal, Overall Severity Severity of abnormal movements (highest score from questions above): None, normal Incapacitation due to abnormal movements: None, normal Patient's awareness of abnormal movements (rate only patient's report): No Awareness, Dental Status Current problems with teeth and/or dentures?: No Does patient usually wear dentures?: No  CIWA:    COWS:     Musculoskeletal: Strength & Muscle Tone: within normal limits Gait & Station: normal Patient leans:  N/A  Psychiatric Specialty Exam: Physical Exam  Nursing note and vitals reviewed. HENT:  Head: Normocephalic and atraumatic.  GI: Normal appearance.  Neurological: She is alert.    Review of Systems  Blood pressure 111/88, pulse 83, temperature 98.8 F (37.1 C), temperature source Oral, resp. rate 20, height 5\' 3"  (1.6 m), weight 60.8 kg, SpO2 100 %.Body mass index is 23.74 kg/m.  General Appearance: Disheveled  Eye Contact:  Fair  Speech:  Pressured  Volume:  Increased  Mood:  Anxious, Dysphoric and Irritable  Affect:  Labile  Thought Process:  Disorganized and Descriptions of Associations: Tangential  Orientation:  Negative  Thought Content:  Delusions, Paranoid Ideation and Rumination  Suicidal Thoughts:  No  Homicidal Thoughts:  No  Memory:  Immediate;   Poor Recent;   Poor Remote;   Poor  Judgement:  Impaired  Insight:  Lacking  Psychomotor Activity:  Increased  Concentration:  Concentration: Fair and Attention Span: Fair  Recall:  of Knowledge:  Fair  Language:  Good  Akathisia:  Negative  Handed:  Right  AIMS (if indicated):     Assets:  Desire for Improvement Resilience  ADL's:  Impaired  Cognition:  WNL  Sleep:  Number of Hours: 5     Treatment Plan Summary: Daily contact with patient to assess and evaluate symptoms and progress in treatment, Medication management and Plan : Patient is seen and examined. Patient is a 34 year old female with the  above-stated past psychiatric history who is seen in follow-up.   Diagnosis: 1.  Schizoaffective disorder; bipolar type  Pertinent findings on examination today: 1.  Continued paranoia and delusional thinking. 2.  Denied any suicidal or homicidal ideation. 3.  Denied any auditory or visual hallucinations. 4.  Poor sleep  Plan: 1. Continue Cogentin 0.5 mg p.o. or IM twice daily for side effects of medication. 2. Increase Tegretol to 300 mg p.o. twice daily for mood stability. 3. Increase  haloperidol to 15 mg p.o. or IM twice daily for psychosis and mood stability 4. Continue lorazepam 2 mg p.o. or IM every 6 hours as needed anxiety or agitation. 5. Add Risperdal 2 mg p.o. twice daily for psychosis and mood stability. This is secondary to the failure of Haldol at least at this point. 6. Continue trazodone 50 mg p.o. every 6 hours as needed insomnia. 7. Disposition planning-in progress.  Antonieta Pert, MD 02/03/2020, 10:41 AM

## 2020-02-04 MED ORDER — HALOPERIDOL LACTATE 5 MG/ML IJ SOLN
5.0000 mg | Freq: Three times a day (TID) | INTRAMUSCULAR | Status: DC
  Filled 2020-02-04 (×17): qty 1

## 2020-02-04 MED ORDER — HALOPERIDOL 5 MG PO TABS
5.0000 mg | ORAL_TABLET | Freq: Three times a day (TID) | ORAL | Status: DC
  Administered 2020-02-04 – 2020-02-08 (×12): 5 mg via ORAL
  Filled 2020-02-04 (×18): qty 1

## 2020-02-04 NOTE — Progress Notes (Signed)
   02/04/20 1200  Psych Admission Type (Psych Patients Only)  Admission Status Involuntary  Psychosocial Assessment  Patient Complaints None  Eye Contact Fair  Facial Expression Anxious  Affect Anxious;Labile  Speech Logical/coherent  Interaction Assertive  Motor Activity Other (Comment) (WDL)  Appearance/Hygiene Unremarkable  Behavior Characteristics Cooperative  Mood Suspicious  Thought Process  Coherency WDL  Content WDL  Delusions Paranoid  Perception Derealization  Hallucination None reported or observed  Judgment Poor  Confusion Mild  Danger to Self  Current suicidal ideation? Denies  Danger to Others  Danger to Others None reported or observed   Pt was mildly guarded in taking her medication and needed encouragement. Pt has been less pressured in speech and hyperverbal compared to prior shift report. Pt has been sleepy this afternoon and medication dosage has been decreased. Pt denies si and hi. Safety maintained on the unit.

## 2020-02-04 NOTE — Progress Notes (Signed)
Patient asleep in bed much of shift.  Minimal interaction with staff and peers this evening. Denies SI, HI, AVH and pain.  Denies any issues or concerns this am. Pt remains on Q 15 minutes safety checks and without self harm gestures. Writer encouraged pt to voice concerns. Pt remains safe on unit.

## 2020-02-04 NOTE — Progress Notes (Signed)
[facility] MD Progress Note  02/04/2020 12:25 PM [name]  MRN:  [id number]   Subjective: Follow-up for this 34 year old female diagnosed with bipolar affective disorder.  Patient reports that she is doing okay today except she is very sleepy.  She states that she is to call her medications that she was ordered.  She denies any suicidal or homicidal ideations and denies any paranoia today.  She also denies having any hallucinations.  She states that she slept extremely well last night.  She reports that she has been getting up and having meals and that she feels very calm today.  Patient denies any complaints except for being drowsy this morning.  Principal Problem: Bipolar affective disorder ([facility]) Diagnosis: Principal Problem:   Bipolar affective disorder ([facility])  Total Time spent with patient: 20 minutes  Past Psychiatric History: Patient has previously been diagnosed with schizoaffective disorder; bipolar type as well as bipolar disorder.  She has had at least 6 or 7 psychiatric hospitalizations in the past.  Her last psychiatric hospitalization at our facility was on 11/29/2018.  It does appear that she has been noncompliant with medications as well as treatment since her last hospitalization.  Her medications on discharge from the hospitalization in 2020 included Cogentin, Tegretol, Haldol as well as Haldol Decanoate 100 mg IM  Past Medical History:  Past Medical History:  Diagnosis Date  . Bipolar 1 disorder ([facility]) 2011  . Paranoia ([facility])   . Schizoaffective disorder ([facility])    History reviewed. No pertinent surgical history. Family History:  Family History  Problem Relation Age of Onset  . Cancer Father        lymphoma  . Hypertension Mother   . Diabetes Neg Hx   . Heart disease Neg Hx    Family Psychiatric  History: Although currently disorganized the patient reported no family history of psychiatric illness Social History:  Social History   Substance and Sexual Activity  Alcohol Use  No   Comment: Pt denies      Social History   Substance and Sexual Activity  Drug Use No   Comment: Pt denies    Social History   Socioeconomic History  . Marital status: Single    Spouse name: Not on file  . Number of children: 0  . Years of education: some colle  . Highest education level: Not on file  Occupational History  . Occupation: [name]     Comment: trying to start   Tobacco Use  . Smoking status: Never Smoker  . Smokeless tobacco: Never Used  Vaping Use  . Vaping Use: Never used  Substance and Sexual Activity  . Alcohol use: No    Comment: Pt denies   . Drug use: No    Comment: Pt denies  . Sexual activity: Not Currently    Birth control/protection: Abstinence, None  Other Topics Concern  . Not on file  Social History Narrative   Work or School: not working   Home Situation: lives with mother   Spiritual Beliefs: Christian   Lifestyle: no regular exercise; healthy diet         Social Determinants of Health   Financial Resource Strain:   . Difficulty of Paying Living Expenses:   Food Insecurity:   . Worried About Programme researcher, broadcasting/film/video in the Last Year:   . Barista in the Last Year:   Transportation Needs:   . Freight forwarder (Medical):   Marland Kitchen Lack of Transportation (Non-Medical):  Physical Activity:   . Days of Exercise per Week:   . Minutes of Exercise per Session:   Stress:   . Feeling of Stress :   Social Connections:   . Frequency of Communication with Friends and Family:   . Frequency of Social Gatherings with Friends and Family:   . Attends Religious Services:   . Active Member of Clubs or Organizations:   . Attends Banker Meetings:   Marland Kitchen Marital Status:    Additional Social History:                         Sleep: Good  Appetite:  Good  Current Medications: Current Facility-Administered Medications  Medication Dose Route Frequency Provider Last Rate Last Admin  . acetaminophen (TYLENOL)  tablet 650 mg  650 mg Oral Q6H PRN Ophelia Shoulder E, NP      . alum & mag hydroxide-simeth (MAALOX/MYLANTA) 200-200-20 MG/5ML suspension 30 mL  30 mL Oral Q4H PRN Ophelia Shoulder E, NP      . benztropine (COGENTIN) tablet 0.5 mg  0.5 mg Oral BID Ophelia Shoulder E, NP   0.5 mg at 02/04/20 1443   Or  . benztropine mesylate (COGENTIN) injection 0.5 mg  0.5 mg Intramuscular BID Ophelia Shoulder E, NP      . carbamazepine (TEGRETOL) chewable tablet 300 mg  300 mg Oral BID Antonieta Pert, MD   300 mg at 02/04/20 0818  . haloperidol (HALDOL) tablet 10 mg  10 mg Oral TID Antonieta Pert, MD   10 mg at 02/04/20 1212   Or  . haloperidol lactate (HALDOL) injection 10 mg  10 mg Intramuscular TID Antonieta Pert, MD      . LORazepam (ATIVAN) tablet 2 mg  2 mg Oral Q6H PRN Antonieta Pert, MD       Or  . LORazepam (ATIVAN) injection 2 mg  2 mg Intramuscular Q6H PRN Antonieta Pert, MD      . risperiDONE (RISPERDAL M-TABS) disintegrating tablet 2 mg  2 mg Oral BID Antonieta Pert, MD   2 mg at 02/04/20 0818  . traZODone (DESYREL) tablet 50 mg  50 mg Oral QHS PRN Antonieta Pert, MD   50 mg at 02/02/20 2103    Lab Results:  Results for orders placed or performed during the hospital encounter of 01/29/20 (from the past 48 hour(s))  CBC with Differential/Platelet     Status: None   Collection Time: 02/03/20  6:45 AM  Result Value Ref Range   WBC 4.3 4.0 - 10.5 K/uL   RBC 4.49 3.87 - 5.11 MIL/uL   Hemoglobin 12.3 12.0 - 15.0 g/dL   HCT 15.4 36 - 46 %   MCV 84.9 80.0 - 100.0 fL   MCH 27.4 26.0 - 34.0 pg   MCHC 32.3 30.0 - 36.0 g/dL   RDW 00.8 67.6 - 19.5 %   Platelets 209 150 - 400 K/uL   nRBC 0.0 0.0 - 0.2 %   Neutrophils Relative % 48 %   Neutro Abs 2.1 1.7 - 7.7 K/uL   Lymphocytes Relative 37 %   Lymphs Abs 1.6 0.7 - 4.0 K/uL   Monocytes Relative 10 %   Monocytes Absolute 0.4 0 - 1 K/uL   Eosinophils Relative 4 %   Eosinophils Absolute 0.2 0 - 0 K/uL   Basophils Relative 1 %    Basophils Absolute 0.1 0 - 0 K/uL   Immature Granulocytes  0 %   Abs Immature Granulocytes 0.00 0.00 - 0.07 K/uL    Comment: Performed at [facility], [place] [address], [place], [place] [address]  Carbamazepine level, total     Status: None   Collection Time: 02/03/20  6:45 AM  Result Value Ref Range   Carbamazepine Lvl 8.4 4.0 - 12.0 ug/mL    Comment: Performed at [facility] [address], [place], [place] [address]  Hepatic function panel     Status: None   Collection Time: 02/03/20  6:45 AM  Result Value Ref Range   Total Protein 6.8 6.5 - 8.1 g/dL   Albumin 3.6 3.5 - 5.0 g/dL   AST 15 15 - 41 U/L   ALT 14 0 - 44 U/L   Alkaline Phosphatase 43 38 - 126 U/L   Total Bilirubin 0.5 0.3 - 1.2 mg/dL   Bilirubin, Direct <0.1 0.0 - 0.2 mg/dL   Indirect Bilirubin NOT CALCULATED 0.3 - 0.9 mg/dL    Comment: Performed at [facility], [place] [address] [place], [place] [address]    Blood Alcohol level:  Lab Results  Component Value Date   ETH <10 01/26/2020   ETH <10 81/08/7508    Metabolic Disorder Labs: Lab Results  Component Value Date   HGBA1C 5.5 11/02/2017   MPG 111.15 11/02/2017   No results found for: PROLACTIN Lab Results  Component Value Date   CHOL 179 07/27/2014   TRIG 49 07/27/2014   HDL 44 07/27/2014   CHOLHDL 5 06/21/2014   VLDL 10 07/27/2014   LDLCALC 125 (H) 07/27/2014   LDLCALC 100 (H) 06/21/2014    Physical Findings: AIMS: Facial and Oral Movements Muscles of Facial Expression: None, normal Lips and Perioral Area: None, normal Jaw: None, normal Tongue: None, normal,Extremity Movements Upper (arms, wrists, hands, fingers): None, normal Lower (legs, knees, ankles, toes): None, normal, Trunk Movements Neck, shoulders, hips: None, normal, Overall Severity Severity of abnormal movements (highest score from questions above): None, normal Incapacitation due to abnormal movements: None, normal Patient's awareness  of abnormal movements (rate only patient's report): No Awareness, Dental Status Current problems with teeth and/or dentures?: No Does patient usually wear dentures?: No  CIWA:    COWS:     Musculoskeletal: Strength & Muscle Tone: within normal limits Gait & Station: normal Patient leans: N/A  Psychiatric Specialty Exam: Physical Exam  Nursing note and vitals reviewed. Constitutional: She is oriented to person, place, and time. She appears well-developed.  Cardiovascular: Normal rate.  Respiratory: Effort normal.  Musculoskeletal:        General: Normal range of motion.  Neurological: She is alert and oriented to person, place, and time.  Skin: Skin is warm.    Review of Systems  Constitutional: Negative.   HENT: Negative.   Eyes: Negative.   Respiratory: Negative.   Cardiovascular: Negative.   Gastrointestinal: Negative.   Genitourinary: Negative.   Musculoskeletal: Negative.   Skin: Negative.   Neurological: Negative.     Blood pressure 105/75, pulse 76, temperature 98.6 F (37 C), temperature source Oral, resp. rate 20, height 5\' 3"  (1.6 m), weight 60.8 kg, SpO2 100 %.Body mass index is 23.74 kg/m.  General Appearance: Disheveled  Eye Contact:  Fair  Speech:  Clear and Coherent and minor slurring when initially starting to speak  Volume:  Decreased  Mood:  Euthymic  Affect:  Congruent  Thought Process:  Coherent and Descriptions of Associations: Intact  Orientation:  Negative  Thought  Content:  Paranoid Ideation  Suicidal Thoughts:  No  Homicidal Thoughts:  No  Memory:  Immediate;   Poor Recent;   Poor Remote;   Poor  Judgement:  Impaired  Insight:  Lacking  Psychomotor Activity:  Decreased  Concentration:  Concentration: Fair  Recall:  Poor  Fund of Knowledge:  Fair  Language:  Fair  Akathisia:  No  Handed:  Right  AIMS (if indicated):     Assets:  Desire for Improvement Financial Resources/Insurance Resilience  ADL's:  Impaired  Cognition:  WNL   Sleep:  Number of Hours: 10.25   Assessment: Patient presents in her room lying in the bed but is easily awakened.  Patient appears to be groggy with some very minor slurred of speech when she initially started speaking.  Patient reports she feels it is due to her medications.  She has not been agitated today and has been very calm and compliant.  Patient has remained in her room most of the day and has a documented of 10.25 hours of sleep.  Vital signs are WNL.  No new labs to report today.  Due to patient's increased sedation and drowsiness we will decrease Haldol to 5 mg p.o. 3 times daily, especially since patient was compliant with taking her Risperdal as prescribed.  Since patient has been compliant with her Risperdal at this time we will continue the original plan by Dr. Jola Babinski of trying to switch patient over to Risperdal.  Treatment Plan Summary: Daily contact with patient to assess and evaluate symptoms and progress in treatment and Medication management Continue Cogentin 0.5 mg p.o. or IM twice daily for EPS Continue Tegretol 300 mg p.o. twice daily for mood stability Decrease Haldol 5 mg p.o. or IM 3 times daily for bipolar affective disorder Continue Ativan 2 mg p.o. or IM every 6 hours as needed for anxiety agitation Continue Risperdal M tabs 2 mg p.o. twice daily for bipolar affective disorder Continue trazodone 50 mg p.o. nightly as needed for insomnia Encourage group therapy participation Continue every 15 minute safety checks  Maryfrances Bunnell, FNP 02/04/2020, 12:25 PM

## 2020-02-04 NOTE — [facility] Group Notes (Signed)
  [facility] LCSW Group Therapy Note  Date/Time:  02/04/2020 11:15AM-12:00PM  Type of Therapy and Topic:  Group Therapy:  Feelings About Hospitalization  Participation Level:  Did Not Attend   Description of Group This process group involved patients discussing their feelings related to being hospitalized, as well as the benefits they see to being in the hospital.  These feelings and benefits were itemized.  The group then brainstormed specific ways in which they could seek those same benefits when they discharge and return home.  Therapeutic Goals 1. Patient will identify and describe positive and negative feelings related to hospitalization 2. Patient will verbalize benefits of hospitalization to themselves personally 3. Patients will brainstorm together ways they can obtain similar benefits in the outpatient setting, identify barriers to wellness and possible solutions  Summary of Patient Progress:  The patient was invited to group but did not attend  Therapeutic Modalities Cognitive Behavioral Therapy Motivational Interviewing    Ambrose Mantle, LCSW 02/04/2020, 12:03 PM

## 2020-02-04 NOTE — [facility] Group Notes (Signed)
Adult Psychoeducational Group Note  Date:  02/04/2020 Time:  11:37 AM  Group Topic/Focus:  Goals Group:   The focus of this group is to help patients establish daily goals to achieve during treatment and discuss how the patient can incorporate goal setting into their daily lives to aide in recovery.  Participation Level:  Did Not Attend   Dione Housekeeper 02/04/2020, 11:37 AM

## 2020-02-05 NOTE — Plan of Care (Signed)
Progress note  D: pt found in bed; compliant with medication administration. Pt denies any physical complaints or pain, but does present anxious and preoccupied with medications. Pt has appropriate complaints of not wanting to be overly sedated. Pt continues to have moments of laughing inappropriately, but has presented childlike with most interactions. Pt denies si/hi/ah/vh and verbally agrees to approach staff if these become apparent or before harming themself/others while at [facility].  A: Pt provided support and encouragement. Pt given medication per protocol and standing orders. Q32m safety checks implemented and continued.  R: Pt safe on the unit. Will continue to monitor.  Pt progressing in the following metrics  Problem: Education: Goal: Ability to state activities that reduce stress will improve Outcome: Progressing   Problem: Coping: Goal: Ability to identify and develop effective coping behavior will improve Outcome: Progressing   Problem: Self-Concept: Goal: Level of anxiety will decrease Outcome: Progressing   Problem: Education: Goal: Utilization of techniques to improve thought processes will improve Outcome: Progressing

## 2020-02-05 NOTE — [facility] Group Notes (Signed)
Adult Psychoeducational Group Note  Date:  02/05/2020  Time:  9:00am-9:45am  Group Topic/Focus: PROGRESSIVE RELAXATION. A group where deep breathing is taught and tensing and relaxation muscle groups is used. Imagery is used as well.  Pts are asked to imagine 3 pillars that hold them up when they are not able to hold themselves up.  Participation Level:  Active  Participation Quality:  Appropriate  Affect:  Appropriate  Cognitive:  Oriented  Insight: Improving  Engagement in Group:  Engaged  Modes of Intervention:  Activity, Discussion, Education, and Support  Additional Comments:  Pt rates her energy level  at a 5/10.States her pillars are her father, mother and music. States that she has learned that we are all different, but all the same  [name] A 1:06 PM

## 2020-02-05 NOTE — [facility] Group Notes (Signed)
[facility] LCSW Group Therapy Note  Date/Time:  02/05/2020  11:00AM-12:00PM  Type of Therapy and Topic:  Group Therapy:  Music and Mood  Participation Level:  Active   Description of Group: In this process group, members listened to a variety of genres of music and identified that different types of music evoke different responses.  Patients were encouraged to identify music that was soothing for them and music that was energizing for them.  Patients discussed how this knowledge can help with wellness and recovery in various ways including managing depression and anxiety as well as encouraging healthy sleep habits.    Therapeutic Goals: Patients will explore the impact of different varieties of music on mood Patients will verbalize the thoughts they have when listening to different types of music Patients will identify music that is soothing to them as well as music that is energizing to them Patients will discuss how to use this knowledge to assist in maintaining wellness and recovery Patients will explore the use of music as a coping skill  Summary of Patient Progress:  At the beginning of group, patient expressed that she felt sluggish.  She participated fully throughout group.  At the end of group, patient expressed that she felt mellow and uplifted, and was smiling continuously.    Therapeutic Modalities: Solution Focused Brief Therapy Activity   Ambrose Mantle, LCSW

## 2020-02-05 NOTE — Progress Notes (Signed)

## 2020-02-05 NOTE — Progress Notes (Signed)
   02/05/20 2154  Psych Admission Type (Psych Patients Only)  Admission Status Involuntary  Psychosocial Assessment  Patient Complaints Anxiety  Eye Contact Brief  Facial Expression Flat  Affect Appropriate to circumstance  Speech Logical/coherent  Interaction Childlike  Motor Activity Other (Comment) (WDL)  Appearance/Hygiene Unremarkable  Behavior Characteristics Appropriate to situation  Mood Preoccupied;Suspicious  Thought Chartered certified accountant of ideas  Content Paranoia;Obsessions  Delusions Paranoid  Perception Derealization  Hallucination None reported or observed  Judgment Limited  Confusion None  Danger to Self  Current suicidal ideation? Denies  Danger to Others  Danger to Others None reported or observed

## 2020-02-05 NOTE — Progress Notes (Signed)
[facility] MD Progress Note  02/05/2020 1:21 PM [name]  MRN:  [id number]   Subjective: Follow-up for this 34 year old female diagnosed with bipolar affective disorder.  Patient states that she is doing good today.  She states that she does not feel as groggy or sleepy as she did yesterday.  She reports that she feels the medications are working a little bit better for her today.  She denies any suicidal homicidal ideations and denies any hallucinations.  She reports that she got up and went and ate her meals today and she is also been going to groups.  She reports that she does feel better today and feels like she can get out of her room more.  She states that she has been taking her medications as prescribed.  Principal Problem: Bipolar affective disorder (HCC) Diagnosis: Principal Problem:   Bipolar affective disorder (HCC)  Total Time spent with patient: 20 minutes  Past Psychiatric History: Patient has previously been diagnosed with schizoaffective disorder; bipolar type as well as bipolar disorder.  She has had at least 6 or 7 psychiatric hospitalizations in the past.  Her last psychiatric hospitalization at our facility was on 11/29/2018.  It does appear that she has been noncompliant with medications as well as treatment since her last hospitalization.  Her medications on discharge from the hospitalization in 2020 included Cogentin, Tegretol, Haldol as well as Haldol Decanoate 100 mg IM  Past Medical History:  Past Medical History:  Diagnosis Date  . Bipolar 1 disorder (HCC) 2011  . Paranoia (HCC)   . Schizoaffective disorder (HCC)    History reviewed. No pertinent surgical history. Family History:  Family History  Problem Relation Age of Onset  . Cancer Father        lymphoma  . Hypertension Mother   . Diabetes Neg Hx   . Heart disease Neg Hx    Family Psychiatric  History: Although currently disorganized the patient reported no family history of psychiatric illness Social  History:  Social History   Substance and Sexual Activity  Alcohol Use No   Comment: Pt denies      Social History   Substance and Sexual Activity  Drug Use No   Comment: Pt denies    Social History   Socioeconomic History  . Marital status: Single    Spouse name: Not on file  . Number of children: 0  . Years of education: some colle  . Highest education level: Not on file  Occupational History  . Occupation: [name]     Comment: trying to start   Tobacco Use  . Smoking status: Never Smoker  . Smokeless tobacco: Never Used  Vaping Use  . Vaping Use: Never used  Substance and Sexual Activity  . Alcohol use: No    Comment: Pt denies   . Drug use: No    Comment: Pt denies  . Sexual activity: Not Currently    Birth control/protection: Abstinence, None  Other Topics Concern  . Not on file  Social History Narrative   Work or School: not working   Home Situation: lives with mother   Spiritual Beliefs: Christian   Lifestyle: no regular exercise; healthy diet         Social Determinants of Health   Financial Resource Strain:   . Difficulty of Paying Living Expenses:   Food Insecurity:   . Worried About Programme researcher, broadcasting/film/video in the Last Year:   . [employer] of Food in the Last  Year:   Transportation Needs:   . Freight forwarder (Medical):   Marland Kitchen Lack of Transportation (Non-Medical):   Physical Activity:   . Days of Exercise per Week:   . Minutes of Exercise per Session:   Stress:   . Feeling of Stress :   Social Connections:   . Frequency of Communication with Friends and Family:   . Frequency of Social Gatherings with Friends and Family:   . Attends Religious Services:   . Active Member of Clubs or Organizations:   . Attends Banker Meetings:   Marland Kitchen Marital Status:    Additional Social History:                         Sleep: Good  Appetite:  Good  Current Medications: Current Facility-Administered Medications  Medication Dose Route  Frequency Provider Last Rate Last Admin  . acetaminophen (TYLENOL) tablet 650 mg  650 mg Oral Q6H PRN Chales Abrahams, NP      . alum & mag hydroxide-simeth (MAALOX/MYLANTA) 200-200-20 MG/5ML suspension 30 mL  30 mL Oral Q4H PRN Ophelia Shoulder E, NP      . benztropine (COGENTIN) tablet 0.5 mg  0.5 mg Oral BID Ophelia Shoulder E, NP   0.5 mg at 02/05/20 0740   Or  . benztropine mesylate (COGENTIN) injection 0.5 mg  0.5 mg Intramuscular BID Ophelia Shoulder E, NP      . carbamazepine (TEGRETOL) chewable tablet 300 mg  300 mg Oral BID Antonieta Pert, MD   300 mg at 02/05/20 0740  . haloperidol (HALDOL) tablet 5 mg  5 mg Oral TID Livio Ledwith, Gerlene Burdock, FNP   5 mg at 02/05/20 1138   Or  . haloperidol lactate (HALDOL) injection 5 mg  5 mg Intramuscular TID Selda Jalbert, Gerlene Burdock, FNP      . LORazepam (ATIVAN) tablet 2 mg  2 mg Oral Q6H PRN Antonieta Pert, MD       Or  . LORazepam (ATIVAN) injection 2 mg  2 mg Intramuscular Q6H PRN Antonieta Pert, MD      . risperiDONE (RISPERDAL M-TABS) disintegrating tablet 2 mg  2 mg Oral BID Antonieta Pert, MD   2 mg at 02/05/20 0740  . traZODone (DESYREL) tablet 50 mg  50 mg Oral QHS PRN Antonieta Pert, MD   50 mg at 02/02/20 2103    Lab Results:  No results found for this or any previous visit (from the past 48 hour(s)).  Blood Alcohol level:  Lab Results  Component Value Date   ETH <10 01/26/2020   ETH <10 11/27/2018    Metabolic Disorder Labs: Lab Results  Component Value Date   HGBA1C 5.5 11/02/2017   MPG 111.15 11/02/2017   No results found for: PROLACTIN Lab Results  Component Value Date   CHOL 179 07/27/2014   TRIG 49 07/27/2014   HDL 44 07/27/2014   CHOLHDL 5 06/21/2014   VLDL 10 07/27/2014   LDLCALC 125 (H) 07/27/2014   LDLCALC 100 (H) 06/21/2014    Physical Findings: AIMS: Facial and Oral Movements Muscles of Facial Expression: None, normal Lips and Perioral Area: None, normal Jaw: None, normal Tongue: None, normal,Extremity  Movements Upper (arms, wrists, hands, fingers): None, normal Lower (legs, knees, ankles, toes): None, normal, Trunk Movements Neck, shoulders, hips: None, normal, Overall Severity Severity of abnormal movements (highest score from questions above): None, normal Incapacitation due to abnormal movements: None, normal Patient's awareness  of abnormal movements (rate only patient's report): No Awareness, Dental Status Current problems with teeth and/or dentures?: No Does patient usually wear dentures?: No  CIWA:    COWS:     Musculoskeletal: Strength & Muscle Tone: within normal limits Gait & Station: normal Patient leans: N/A  Psychiatric Specialty Exam: Physical Exam  Nursing note and vitals reviewed. Constitutional: She is oriented to person, place, and time. She appears well-developed.  Cardiovascular: Normal rate.  Respiratory: Effort normal.  Musculoskeletal:        General: Normal range of motion.  Neurological: She is alert and oriented to person, place, and time.  Skin: Skin is warm.    Review of Systems  Constitutional: Negative.   HENT: Negative.   Eyes: Negative.   Respiratory: Negative.   Cardiovascular: Negative.   Gastrointestinal: Negative.   Genitourinary: Negative.   Musculoskeletal: Negative.   Skin: Negative.   Neurological: Negative.     Blood pressure 108/74, pulse 82, temperature 98.3 F (36.8 C), temperature source Oral, resp. rate 20, height 5\' 3"  (1.6 m), weight 60.8 kg, SpO2 100 %.Body mass index is 23.74 kg/m.  General Appearance: Disheveled  Eye Contact:  Fair  Speech:  Clear and Coherent and minor slurring when initially starting to speak  Volume:  Decreased  Mood:  Euthymic  Affect:  Congruent  Thought Process:  Coherent and Descriptions of Associations: Intact  Orientation:  Full (Time, Place, and Person)  Thought Content:  WDL  Suicidal Thoughts:  No  Homicidal Thoughts:  No  Memory:  Immediate;   Poor Recent;   Poor Remote;   Poor   Judgement:  Fair  Insight:  Fair  Psychomotor Activity:  Decreased  Concentration:  Concentration: Fair  Recall:  Poor  Fund of Knowledge:  Fair  Language:  Fair  Akathisia:  No  Handed:  Right  AIMS (if indicated):     Assets:  Desire for Improvement Financial Resources/Insurance Resilience  ADL's:  Impaired  Cognition:  WNL  Sleep:  Number of Hours: 6025   Assessment: Patient presents walking around the milieu and has been seen interacting with peers and staff appropriately.  Patient has been attending groups.  Patient continues to smile inappropriately at times but is not as significant as it has been.  Today she does not appear to be responding to any internal or external stimuli and has not been aggressive or threatening to anyone.  Patient's conversation seems to be much more logical today with discussing medications as well as how she is feeling with her mood.  Vital signs are WNL.  No new labs to report.  Treatment Plan Summary: Daily contact with patient to assess and evaluate symptoms and progress in treatment and Medication management Continue Cogentin 0.5 mg p.o. or IM twice daily for EPS Continue Tegretol 300 mg p.o. twice daily for mood stability Continue Haldol 5 mg p.o. or IM 3 times daily for bipolar affective disorder Continue Ativan 2 mg p.o. or IM every 6 hours as needed for anxiety agitation Continue Risperdal M tabs 2 mg p.o. twice daily for bipolar affective disorder Continue trazodone 50 mg p.o. nightly as needed for insomnia Encourage group therapy participation Continue every 15 minute safety checks  Lewis Shock, FNP 02/05/2020, 1:21 PM

## 2020-02-06 MED ORDER — RISPERIDONE 1 MG PO TBDP
1.0000 mg | ORAL_TABLET | ORAL | Status: AC
  Filled 2020-02-06: qty 1

## 2020-02-06 MED ORDER — RISPERIDONE 3 MG PO TBDP
3.0000 mg | ORAL_TABLET | Freq: Two times a day (BID) | ORAL | Status: DC
  Administered 2020-02-06 – 2020-02-08 (×4): 3 mg via ORAL
  Filled 2020-02-06 (×8): qty 1

## 2020-02-06 NOTE — Progress Notes (Signed)
Recreation Therapy Notes  Date: 6.14.21 Time: 1000 Location: 500 Hall Dayroom  Group Topic: Communication  Goal Area(s) Addresses:  Patient will identify biggest triggers. Patient will identify strategies to avoid exposure to triggers Patient will identify strategies to deal with triggers head on.  Behavioral Response: Engaged  Intervention: Worksheet, pencils   Activity: Triggers.  Patients were to identify their biggest triggers.  Patients then identified ways to avoid those triggers and identify strategies to deal with triggers head on when unable to be avoided.  Education: Communication, Discharge Planning  Education Outcome: Acknowledges understanding/In group clarification offered/Needs additional education.   Clinical Observations/Feedback: Pt didn't identify triggers but was able to identify coping strategies to deal with them.  Pt avoids triggers by remaining calm and turn on music, walk outside and start a walking trail and go to the park and feed the ducks.  Pt faces triggers head on by counting to 10 backwards, walking away and stay away for hours.    Caroll Rancher, LRT/CTRS     Caroll Rancher A 02/06/2020 12:13 PM

## 2020-02-06 NOTE — [facility] Counselor (Signed)
CSW spoke with patient's mother, Devynne Sturdivant [phone]). Mother asked questions regarding general progress. Mother spoke with patient by phone this morning and notes improvement from admission. However, mother shares patient called her last night after 11pm and told her mother to "lock the doors, I have a bad feeling." CSW shared with mother that patient did not sleep well last night and reported having nightmares.  Mother requested a call from psychiatry to discuss progress  Enid Cutter, MSW, LCSW-A Clinical Social Worker [facility]

## 2020-02-06 NOTE — Progress Notes (Signed)
   02/06/20 2200  Psych Admission Type (Psych Patients Only)  Admission Status Involuntary  Psychosocial Assessment  Patient Complaints Anxiety  Eye Contact Brief  Facial Expression Flat  Affect Appropriate to circumstance  Speech Logical/coherent  Interaction Childlike  Motor Activity Other (Comment) (WDL)  Appearance/Hygiene Unremarkable  Behavior Characteristics Cooperative  Mood Suspicious;Preoccupied  Thought Chartered certified accountant of ideas  Content Paranoia;Obsessions  Delusions Paranoid  Perception Derealization  Hallucination None reported or observed  Judgment Limited  Confusion None  Danger to Self  Current suicidal ideation? Denies  Danger to Others  Danger to Others None reported or observed

## 2020-02-06 NOTE — Progress Notes (Signed)
Pt up and crying, states that she has had a bad dream.  Pt has not been sleeping, however.  Pt offered medication again after stating that she "can't sleep", and again pt refused medication.  Pt has been nearly constantly at nurses station and appears paranoid since 0230.  Will continue to monitor.

## 2020-02-06 NOTE — Progress Notes (Signed)
Pt up most of night, refused medications.  Appeared paranoid, needed near constant reassurance that she was safe.  Stated she did not wish to lay back down or take anything "I have my reasons".

## 2020-02-06 NOTE — Progress Notes (Signed)
[facility] MD Progress Note  02/06/2020 1:59 PM [name]  MRN:  [id number] Subjective:  Patient is a 34 year old female with a past psychiatric history significant for schizoaffective disorder; bipolar type versus bipolar disorder type I who presented to the [facility] emergency department on 01/27/2020 with mania.  Objective: Patient is seen and examined.  Patient is a 34 year old female with the above-stated past psychiatric history seen in follow-up.  She is less irritable and depressed today, but still having psychotic features.  She discusses the fact that she saw "3 men" walking on a line outside of her window.  She stated they were all dressed the same.  One had a hat on.  This meant something special to her.  She was paranoid about that.  Her blood pressure is still variable.  Initially in the morning is 113/71, little bit later is 144/114.  She is afebrile.  Her sleep is still poor.  She only slept 3.25 hours last night.  Over the weekend she apparently began taking the Risperdal.  She took 2 mg p.o. twice daily, and continued on Haldol 5 mg p.o. or IM 3 times daily for psychosis.  Her laboratories from 6/11 showed normal liver function enzymes.  Normal CBC.  Her Tegretol level was 8.4.  Principal Problem: Bipolar affective disorder (HCC) Diagnosis: Principal Problem:   Bipolar affective disorder (HCC)  Total Time spent with patient: 15 minutes  Past Psychiatric History: See admission H&P  Past Medical History:  Past Medical History:  Diagnosis Date  . Bipolar 1 disorder (HCC) 2011  . Paranoia (HCC)   . Schizoaffective disorder (HCC)    History reviewed. No pertinent surgical history. Family History:  Family History  Problem Relation Age of Onset  . Cancer Father        lymphoma  . Hypertension Mother   . Diabetes Neg Hx   . Heart disease Neg Hx    Family Psychiatric  History: See admission H&P Social History:  Social History   Substance and Sexual Activity   Alcohol Use No   Comment: Pt denies      Social History   Substance and Sexual Activity  Drug Use No   Comment: Pt denies    Social History   Socioeconomic History  . Marital status: Single    Spouse name: Not on file  . Number of children: 0  . Years of education: some colle  . Highest education level: Not on file  Occupational History  . Occupation: [name]     Comment: trying to start   Tobacco Use  . Smoking status: Never Smoker  . Smokeless tobacco: Never Used  Vaping Use  . Vaping Use: Never used  Substance and Sexual Activity  . Alcohol use: No    Comment: Pt denies   . Drug use: No    Comment: Pt denies  . Sexual activity: Not Currently    Birth control/protection: Abstinence, None  Other Topics Concern  . Not on file  Social History Narrative   Work or School: not working   Home Situation: lives with mother   Spiritual Beliefs: Christian   Lifestyle: no regular exercise; healthy diet         Social Determinants of Health   Financial Resource Strain:   . Difficulty of Paying Living Expenses:   Food Insecurity:   . Worried About Programme researcher, broadcasting/film/video in the Last Year:   . [employer] of Food in the Last Year:  Transportation Needs:   . Freight forwarder (Medical):   Marland Kitchen Lack of Transportation (Non-Medical):   Physical Activity:   . Days of Exercise per Week:   . Minutes of Exercise per Session:   Stress:   . Feeling of Stress :   Social Connections:   . Frequency of Communication with Friends and Family:   . Frequency of Social Gatherings with Friends and Family:   . Attends Religious Services:   . Active Member of Clubs or Organizations:   . Attends Banker Meetings:   Marland Kitchen Marital Status:    Additional Social History:                         Sleep: Fair  Appetite:  Fair  Current Medications: Current Facility-Administered Medications  Medication Dose Route Frequency Provider Last Rate Last Admin  . acetaminophen  (TYLENOL) tablet 650 mg  650 mg Oral Q6H PRN Chales Abrahams, NP      . alum & mag hydroxide-simeth (MAALOX/MYLANTA) 200-200-20 MG/5ML suspension 30 mL  30 mL Oral Q4H PRN Ophelia Shoulder E, NP      . benztropine (COGENTIN) tablet 0.5 mg  0.5 mg Oral BID Ophelia Shoulder E, NP   0.5 mg at 02/06/20 0818   Or  . benztropine mesylate (COGENTIN) injection 0.5 mg  0.5 mg Intramuscular BID Ophelia Shoulder E, NP      . carbamazepine (TEGRETOL) chewable tablet 300 mg  300 mg Oral BID Antonieta Pert, MD   300 mg at 02/06/20 0817  . haloperidol (HALDOL) tablet 5 mg  5 mg Oral TID Money, Gerlene Burdock, FNP   5 mg at 02/06/20 1245   Or  . haloperidol lactate (HALDOL) injection 5 mg  5 mg Intramuscular TID Money, Gerlene Burdock, FNP      . LORazepam (ATIVAN) tablet 2 mg  2 mg Oral Q6H PRN Antonieta Pert, MD       Or  . LORazepam (ATIVAN) injection 2 mg  2 mg Intramuscular Q6H PRN Antonieta Pert, MD      . risperiDONE (RISPERDAL M-TABS) disintegrating tablet 2 mg  2 mg Oral BID Antonieta Pert, MD   2 mg at 02/06/20 0818  . traZODone (DESYREL) tablet 50 mg  50 mg Oral QHS PRN Antonieta Pert, MD   50 mg at 02/02/20 2103    Lab Results: No results found for this or any previous visit (from the past 48 hour(s)).  Blood Alcohol level:  Lab Results  Component Value Date   ETH <10 01/26/2020   ETH <10 11/27/2018    Metabolic Disorder Labs: Lab Results  Component Value Date   HGBA1C 5.5 11/02/2017   MPG 111.15 11/02/2017   No results found for: PROLACTIN Lab Results  Component Value Date   CHOL 179 07/27/2014   TRIG 49 07/27/2014   HDL 44 07/27/2014   CHOLHDL 5 06/21/2014   VLDL 10 07/27/2014   LDLCALC 125 (H) 07/27/2014   LDLCALC 100 (H) 06/21/2014    Physical Findings: AIMS: Facial and Oral Movements Muscles of Facial Expression: None, normal Lips and Perioral Area: None, normal Jaw: None, normal Tongue: None, normal,Extremity Movements Upper (arms, wrists, hands, fingers): None,  normal Lower (legs, knees, ankles, toes): None, normal, Trunk Movements Neck, shoulders, hips: None, normal, Overall Severity Severity of abnormal movements (highest score from questions above): None, normal Incapacitation due to abnormal movements: None, normal Patient's awareness of abnormal movements (rate  only patient's report): No Awareness, Dental Status Current problems with teeth and/or dentures?: No Does patient usually wear dentures?: No  CIWA:    COWS:     Musculoskeletal: Strength & Muscle Tone: within normal limits Gait & Station: normal Patient leans: N/A  Psychiatric Specialty Exam: Physical Exam  Nursing note and vitals reviewed. Constitutional: She is oriented to person, place, and time.  HENT:  Head: Normocephalic and atraumatic.  Respiratory: Effort normal.  GI: Normal appearance.  Neurological: She is alert and oriented to person, place, and time.    Review of Systems  Blood pressure (!) 144/114, pulse 92, temperature 98.7 F (37.1 C), temperature source Oral, resp. rate 20, height 5\' 3"  (1.6 m), weight 60.8 kg, SpO2 100 %.Body mass index is 23.74 kg/m.  General Appearance: Casual  Eye Contact:  Fair  Speech:  Normal Rate  Volume:  Increased  Mood:  Anxious and Dysphoric  Affect:  Labile  Thought Process:  Coherent and Descriptions of Associations: Loose  Orientation:  Full (Time, Place, and Person)  Thought Content:  Delusions, Hallucinations: Auditory Visual and Paranoid Ideation  Suicidal Thoughts:  No  Homicidal Thoughts:  No  Memory:  Immediate;   Fair Recent;   Fair Remote;   Fair  Judgement:  Intact  Insight:  Fair  Psychomotor Activity:  Increased  Concentration:  Concentration: Fair and Attention Span: Fair  Recall:  [employer] of Knowledge:  Fair  Language:  Fair  Akathisia:  Negative  Handed:  Right  AIMS (if indicated):     Assets:  Desire for Improvement Resilience  ADL's:  Intact  Cognition:  WNL  Sleep:  Number of Hours:  3.25     Treatment Plan Summary: Daily contact with patient to assess and evaluate symptoms and progress in treatment, Medication management and Plan : Patient is seen and examined.  Patient is a 34 year old female with the above-stated past psychiatric history who is seen in follow-up.   Diagnosis: 1. Schizoaffective disorder; bipolar type  Pertinent findings on examination today: 1.  Continued paranoia and delusional thinking. 2.  Continued auditory and visual hallucinations. 3.  Denied any suicidal or homicidal ideation. 4.  Continued problems with sleep. 5.  Variable hypertension  Plan: 1.  Continue Cogentin 0.5 mg p.o. or IM twice daily for side effects of medication. 2.  Continue Tegretol chewable tablets 300 mg p.o. twice daily for mood stability. 3.  Continue haloperidol 5 mg p.o. 3 times daily p.o. or IM for psychosis. 4.  Continue lorazepam 2 mg p.o. or IM every 6 hours as needed agitation or anxiety. 5.  Increase Risperdal to 3 mg p.o. twice daily for psychosis. 6.  Continue trazodone 50 mg p.o. nightly as needed insomnia. 7.  Disposition planning-in progress.  Sharma Covert, MD 02/06/2020, 1:59 PM

## 2020-02-07 MED ORDER — AMLODIPINE BESYLATE 5 MG PO TABS
5.0000 mg | ORAL_TABLET | Freq: Every day | ORAL | Status: DC
  Administered 2020-02-07 – 2020-02-08 (×2): 5 mg via ORAL
  Filled 2020-02-07 (×5): qty 1

## 2020-02-07 NOTE — Progress Notes (Signed)
[facility] MD Progress Note  02/07/2020 10:18 AM [name]  MRN:  [id number] Subjective:  Patient is a 34 year old female with a past psychiatric history significant for schizoaffective disorder; bipolar type versus bipolar disorder type I who presented to the [facility] emergency department on 01/27/2020 with mania.  Objective: Patient is seen and examined.  Patient is a 34 year old female with the above-stated past psychiatric history who is seen in follow-up.  She continues to slowly improve.  Her irritability has decreased significantly, and her depression has improved as well.  She denied any auditory or visual hallucinations today.  She did sleep better last night.  I contacted her mother and discussed the case with her and she felt as though her conversations over the telephone with her have improved significantly since admission.  She denied any suicidal or homicidal ideation.  She denied any side effects to her current medications.  Her blood pressure remains elevated.  Is 144/114 today.  Pulse is 92.  She is afebrile.  Her sleep is significantly better.  Yesterday she slept 3.25 hours, but she slept 8.25 hours last night.  No new laboratories.  Principal Problem: Bipolar affective disorder (HCC) Diagnosis: Principal Problem:   Bipolar affective disorder (HCC)  Total Time spent with patient: 15 minutes  Past Psychiatric History: See admission H&P  Past Medical History:  Past Medical History:  Diagnosis Date   Bipolar 1 disorder (HCC) 2011   Paranoia (HCC)    Schizoaffective disorder (HCC)    History reviewed. No pertinent surgical history. Family History:  Family History  Problem Relation Age of Onset   Cancer Father        lymphoma   Hypertension Mother    Diabetes Neg Hx    Heart disease Neg Hx    Family Psychiatric  History: See admission H&P Social History:  Social History   Substance and Sexual Activity  Alcohol Use No   Comment: Pt denies       Social History   Substance and Sexual Activity  Drug Use No   Comment: Pt denies    Social History   Socioeconomic History   Marital status: Single    Spouse name: Not on file   Number of children: 0   Years of education: some colle   Highest education level: Not on file  Occupational History   Occupation: [name]     Comment: trying to start   Tobacco Use   Smoking status: Never Smoker   Smokeless tobacco: Never Used  Vaping Use   Vaping Use: Never used  Substance and Sexual Activity   Alcohol use: No    Comment: Pt denies    Drug use: No    Comment: Pt denies   Sexual activity: Not Currently    Birth control/protection: Abstinence, None  Other Topics Concern   Not on file  Social History Narrative   Work or School: not working   Home Situation: lives with mother   Spiritual Beliefs: Christian   Lifestyle: no regular exercise; healthy diet         Social Determinants of Health   Financial Resource Strain:    Difficulty of Paying Living Expenses:   Food Insecurity:    Worried About Programme researcher, broadcasting/film/video in the Last Year:    Barista in the Last Year:   Transportation Needs:    Freight forwarder (Medical):    Lack of Transportation (Non-Medical):   Physical Activity:  Days of Exercise per Week:    Minutes of Exercise per Session:   Stress:    Feeling of Stress :   Social Connections:    Frequency of Communication with Friends and Family:    Frequency of Social Gatherings with Friends and Family:    Attends Religious Services:    Active Member of Clubs or Organizations:    Attends Banker Meetings:    Marital Status:    Additional Social History:                         Sleep: Good  Appetite:  Good  Current Medications: Current Facility-Administered Medications  Medication Dose Route Frequency Provider Last Rate Last Admin   acetaminophen (TYLENOL) tablet 650 mg  650 mg Oral Q6H PRN  Chales Abrahams, NP       alum & mag hydroxide-simeth (MAALOX/MYLANTA) 200-200-20 MG/5ML suspension 30 mL  30 mL Oral Q4H PRN Ophelia Shoulder E, NP       benztropine (COGENTIN) tablet 0.5 mg  0.5 mg Oral BID Ophelia Shoulder E, NP   0.5 mg at 02/07/20 4259   Or   benztropine mesylate (COGENTIN) injection 0.5 mg  0.5 mg Intramuscular BID Ophelia Shoulder E, NP       carbamazepine (TEGRETOL) chewable tablet 300 mg  300 mg Oral BID Antonieta Pert, MD   300 mg at 02/07/20 0827   haloperidol (HALDOL) tablet 5 mg  5 mg Oral TID Money, Gerlene Burdock, FNP   5 mg at 02/07/20 5638   Or   haloperidol lactate (HALDOL) injection 5 mg  5 mg Intramuscular TID Money, Gerlene Burdock, FNP       LORazepam (ATIVAN) tablet 2 mg  2 mg Oral Q6H PRN Antonieta Pert, MD       Or   LORazepam (ATIVAN) injection 2 mg  2 mg Intramuscular Q6H PRN Antonieta Pert, MD       risperiDONE (RISPERDAL M-TABS) disintegrating tablet 1 mg  1 mg Oral NOW Jola Babinski, Marlane Mingle, MD       risperiDONE (RISPERDAL M-TABS) disintegrating tablet 3 mg  3 mg Oral BID Antonieta Pert, MD   3 mg at 02/07/20 0827    Lab Results: No results found for this or any previous visit (from the past 48 hour(s)).  Blood Alcohol level:  Lab Results  Component Value Date   ETH <10 01/26/2020   ETH <10 11/27/2018    Metabolic Disorder Labs: Lab Results  Component Value Date   HGBA1C 5.5 11/02/2017   MPG 111.15 11/02/2017   No results found for: PROLACTIN Lab Results  Component Value Date   CHOL 179 07/27/2014   TRIG 49 07/27/2014   HDL 44 07/27/2014   CHOLHDL 5 06/21/2014   VLDL 10 07/27/2014   LDLCALC 125 (H) 07/27/2014   LDLCALC 100 (H) 06/21/2014    Physical Findings: AIMS: Facial and Oral Movements Muscles of Facial Expression: None, normal Lips and Perioral Area: None, normal Jaw: None, normal Tongue: None, normal,Extremity Movements Upper (arms, wrists, hands, fingers): None, normal Lower (legs, knees, ankles, toes): None,  normal, Trunk Movements Neck, shoulders, hips: None, normal, Overall Severity Severity of abnormal movements (highest score from questions above): None, normal Incapacitation due to abnormal movements: None, normal Patient's awareness of abnormal movements (rate only patient's report): No Awareness, Dental Status Current problems with teeth and/or dentures?: No Does patient usually wear dentures?: No  CIWA:  COWS:     Musculoskeletal: Strength & Muscle Tone: within normal limits Gait & Station: normal Patient leans: N/A  Psychiatric Specialty Exam: Physical Exam  Constitutional: She is oriented to person, place, and time.  HENT:  Head: Normocephalic and atraumatic.  GI: Normal appearance.  Neurological: She is alert and oriented to person, place, and time.    Review of Systems  Blood pressure (!) 144/114, pulse 92, temperature 98.7 F (37.1 C), temperature source Oral, resp. rate 20, height 5\' 3"  (1.6 m), weight 60.8 kg, SpO2 100 %.Body mass index is 23.74 kg/m.  General Appearance: Casual  Eye Contact:  Fair  Speech:  Normal Rate  Volume:  Normal  Mood:  Anxious  Affect:  Congruent  Thought Process:  Coherent and Descriptions of Associations: Circumstantial  Orientation:  Full (Time, Place, and Person)  Thought Content:  Logical  Suicidal Thoughts:  No  Homicidal Thoughts:  No  Memory:  Immediate;   Fair Recent;   Fair Remote;   Fair  Judgement:  Intact  Insight:  Fair  Psychomotor Activity:  Normal  Concentration:  Concentration: Fair and Attention Span: Fair  Recall:  [employer] of Knowledge:  Fair  Language:  Fair  Akathisia:  Negative  Handed:  Right  AIMS (if indicated):     Assets:  Desire for Improvement Housing Resilience Social Support  ADL's:  Intact  Cognition:  WNL  Sleep:  Number of Hours: 8.25     Treatment Plan Summary: Daily contact with patient to assess and evaluate symptoms and progress in treatment, Medication management and Plan :  Patient is seen and examined.  Patient is a 34 year old female with the above-stated past psychiatric history who is seen in follow-up.   Diagnosis: 1. Schizoaffective disorder; bipolar type 2.  Hypertension  Pertinent findings on examination today: 1.  Decreased agitation, delusional thinking and paranoia. 2.  Improved sleep. 3.  No suicidal or homicidal ideation. 4.  Elevated blood pressure.  Plan: 1.  Continue Tegretol 300 mg p.o. twice daily for mood stability. 2.  Continue Haldol 5 mg p.o. 3 times daily for psychosis and mood stability. 3.  Continue Risperdal 3 mg p.o. twice daily for mood stability and psychosis. 4.  Add amlodipine 5 mg p.o. daily for hypertension. 5.  Disposition planning-if all goes well discharge in 1 to 2 days.  Sharma Covert, MD 02/07/2020, 10:18 AM

## 2020-02-07 NOTE — Progress Notes (Signed)
Pt continues to keep to herself much of the evening. Pt forwards little information    02/07/20 2200  Psych Admission Type (Psych Patients Only)  Admission Status Involuntary  Psychosocial Assessment  Patient Complaints Anxiety  Eye Contact Brief  Facial Expression Flat  Affect Appropriate to circumstance  Speech Logical/coherent  Interaction Childlike  Motor Activity Other (Comment) (WDL)  Appearance/Hygiene Unremarkable  Behavior Characteristics Cooperative  Mood Preoccupied  Thought Process  Coherency Flight of ideas  Content Paranoia;Obsessions  Delusions Paranoid  Perception Derealization  Hallucination None reported or observed  Judgment Limited  Confusion None  Danger to Self  Current suicidal ideation? Denies  Danger to Others  Danger to Others None reported or observed

## 2020-02-07 NOTE — Progress Notes (Signed)
   02/07/20 1400  Psych Admission Type (Psych Patients Only)  Admission Status Involuntary  Psychosocial Assessment  Patient Complaints Anxiety  Eye Contact Brief  Facial Expression Flat  Affect Appropriate to circumstance  Speech Logical/coherent  Interaction Childlike  Motor Activity Other (Comment) (WDL)  Appearance/Hygiene Unremarkable  Behavior Characteristics Cooperative  Mood Preoccupied  Thought Process  Coherency Flight of ideas  Content Paranoia;Obsessions  Delusions Paranoid  Perception Derealization  Hallucination None reported or observed  Judgment Limited  Confusion None  Danger to Self  Current suicidal ideation? Denies  Danger to Others  Danger to Others None reported or observed

## 2020-02-08 MED ORDER — BENZTROPINE MESYLATE 0.5 MG PO TABS: 0.5000 mg | ORAL_TABLET | Freq: Two times a day (BID) | ORAL | 0 refills | Status: AC

## 2020-02-08 MED ORDER — AMLODIPINE BESYLATE 5 MG PO TABS: 5.0000 mg | ORAL_TABLET | Freq: Every day | ORAL | 0 refills | Status: AC

## 2020-02-08 MED ORDER — RISPERIDONE 3 MG PO TBDP: 3.0000 mg | ORAL_TABLET | Freq: Two times a day (BID) | ORAL | 0 refills | Status: DC

## 2020-02-08 MED ORDER — CARBAMAZEPINE 100 MG PO CHEW: 300.0000 mg | CHEWABLE_TABLET | Freq: Two times a day (BID) | ORAL | 1 refills | Status: AC

## 2020-02-08 MED ORDER — HALOPERIDOL 5 MG PO TABS: 5.0000 mg | ORAL_TABLET | Freq: Three times a day (TID) | ORAL | 0 refills | Status: AC

## 2020-02-08 NOTE — BHH Suicide Risk Assessment (Signed)
[facility] Discharge Suicide Risk Assessment   Principal Problem: Bipolar affective disorder [facility]) Discharge Diagnoses: Principal Problem:   Bipolar affective disorder (HCC)   Total Time spent with patient: 20 minutes  Musculoskeletal: Strength & Muscle Tone: within normal limits Gait & Station: normal Patient leans: N/A  Psychiatric Specialty Exam: Review of Systems  All other systems reviewed and are negative.   Blood pressure 113/77, pulse 89, temperature 98.7 F (37.1 C), temperature source Oral, resp. rate 20, height 5\' 3"  (1.6 m), weight 60.8 kg, SpO2 100 %.Body mass index is 23.74 kg/m.  General Appearance: Casual  Eye Contact::  Fair  Speech:  Normal Rate409  Volume:  Normal  Mood:  Euthymic  Affect:  Congruent  Thought Process:  Coherent and Descriptions of Associations: Intact  Orientation:  Full (Time, Place, and Person)  Thought Content:  Logical  Suicidal Thoughts:  No  Homicidal Thoughts:  No  Memory:  Immediate;   Fair Recent;   Fair Remote;   Fair  Judgement:  Intact  Insight:  Fair  Psychomotor Activity:  Normal  Concentration:  Fair  Recall:  [id number] of Knowledge:Fair  Language: Fair  Akathisia:  Negative  Handed:  Right  AIMS (if indicated):     Assets:  Desire for Improvement Housing Resilience Social Support  Sleep:  Number of Hours: 7.25  Cognition: WNL  ADL's:  Intact   Mental Status Per Nursing Assessment::   On Admission:  NA  Demographic Factors:  Low socioeconomic status and Unemployed  Loss Factors: NA  Historical Factors: Impulsivity  Risk Reduction Factors:   Living with another person, especially a relative and Positive social support  Continued Clinical Symptoms:  Bipolar Disorder:   Mixed State Schizophrenia:   Paranoid or undifferentiated type  Cognitive Features That Contribute To Risk:  None    Suicide Risk:  Minimal: No identifiable suicidal ideation.  Patients presenting with no risk factors but with morbid  ruminations; may be classified as minimal risk based on the severity of the depressive symptoms   Follow-up Information    Counseling, [facility]. Call.   Why: Please contact this provider for information regarding establishing therapy services.   Contact information: [address] [place] [address] [place] [phone]        [facility]. Go on 02/16/2020.   Specialty: Behavioral Health Why: You have an appointment for therapy on 02/16/20 at 1:00 pm.  This appointment will be held in person. Contact information: [address] [place] [phone]              Plan Of Care/Follow-up recommendations:  Activity:  ad lib  [phone], MD 02/08/2020, 9:23 AM

## 2020-02-08 NOTE — Tx Team (Signed)
Interdisciplinary Treatment and Diagnostic Plan Update  02/08/2020 Time of Session: 9:00am [name] MRN: [id number]  Principal Diagnosis: Bipolar affective disorder [employer])  Secondary Diagnoses: Principal Problem:   Bipolar affective disorder ([place])   Current Medications:  Current Facility-Administered Medications  Medication Dose Route Frequency Provider Last Rate Last Admin  . acetaminophen (TYLENOL) tablet 650 mg  650 mg Oral Q6H PRN Merlyn Lot E, NP      . alum & mag hydroxide-simeth (MAALOX/MYLANTA) 200-200-20 MG/5ML suspension 30 mL  30 mL Oral Q4H PRN Merlyn Lot E, NP      . amLODipine (NORVASC) tablet 5 mg  5 mg Oral Daily Sharma Covert, MD   5 mg at 02/08/20 0817  . benztropine (COGENTIN) tablet 0.5 mg  0.5 mg Oral BID Merlyn Lot E, NP   0.5 mg at 02/08/20 9833   Or  . benztropine mesylate (COGENTIN) injection 0.5 mg  0.5 mg Intramuscular BID Merlyn Lot E, NP      . carbamazepine (TEGRETOL) chewable tablet 300 mg  300 mg Oral BID Sharma Covert, MD   300 mg at 02/08/20 0817  . haloperidol (HALDOL) tablet 5 mg  5 mg Oral TID Money, Lowry Ram, FNP   5 mg at 02/08/20 8250   Or  . haloperidol lactate (HALDOL) injection 5 mg  5 mg Intramuscular TID Money, Lowry Ram, FNP      . LORazepam (ATIVAN) tablet 2 mg  2 mg Oral Q6H PRN Sharma Covert, MD       Or  . LORazepam (ATIVAN) injection 2 mg  2 mg Intramuscular Q6H PRN Sharma Covert, MD      . risperiDONE (RISPERDAL M-TABS) disintegrating tablet 3 mg  3 mg Oral BID Sharma Covert, MD   3 mg at 02/08/20 5397   PTA Medications: Medications Prior to Admission  Medication Sig Dispense Refill Last Dose  . benztropine (COGENTIN) 1 MG tablet Take 1 tablet (1 mg total) by mouth 2 (two) times daily. (Patient not taking: Reported on 09/13/2019) 60 tablet 1   . carbamazepine (TEGRETOL) 200 MG tablet Take 1 tablet (200 mg total) by mouth 2 (two) times daily after a meal. (Patient not taking: Reported on  09/13/2019) 60 tablet 2   . haloperidol (HALDOL) 10 MG tablet Take 1 tablet (10 mg total) by mouth at bedtime. (Patient not taking: Reported on 09/13/2019) 90 tablet 1   . haloperidol decanoate (HALDOL DECANOATE) 100 MG/ML injection Inject 1 mL (100 mg total) into the muscle every 28 (twenty-eight) days. Next due 12/28/18 (Patient not taking: Reported on 09/13/2019) 1 mL 11   . Multiple Vitamin (MULTIVITAMIN ADULT) TABS Take 1 tablet by mouth daily.     Marland Kitchen OVER THE COUNTER MEDICATION Take 1 tablet by mouth daily. Calm       Patient Stressors: Financial difficulties Medication change or noncompliance  Patient Strengths: Physical Health Religious Affiliation Supportive family/friends  Treatment Modalities: Medication Management, Group therapy, Case management,  1 to 1 session with clinician, Psychoeducation, Recreational therapy.   Physician Treatment Plan for Primary Diagnosis: Bipolar affective disorder ([place]) Long Term Goal(s): Improvement in symptoms so as ready for discharge Improvement in symptoms so as ready for discharge   Short Term Goals: Ability to identify changes in lifestyle to reduce recurrence of condition will improve Ability to verbalize feelings will improve Ability to demonstrate self-control will improve Ability to identify and develop effective coping behaviors will improve Ability to maintain clinical measurements within normal limits  will improve Compliance with prescribed medications will improve Ability to identify changes in lifestyle to reduce recurrence of condition will improve Ability to verbalize feelings will improve Ability to demonstrate self-control will improve Ability to identify and develop effective coping behaviors will improve Ability to maintain clinical measurements within normal limits will improve Compliance with prescribed medications will improve  Medication Management: Evaluate patient's response, side effects, and tolerance of medication  regimen.  Therapeutic Interventions: 1 to 1 sessions, Unit Group sessions and Medication administration.  Evaluation of Outcomes: Adequate for Discharge  Physician Treatment Plan for Secondary Diagnosis: Principal Problem:   Bipolar affective disorder (HCC)  Long Term Goal(s): Improvement in symptoms so as ready for discharge Improvement in symptoms so as ready for discharge   Short Term Goals: Ability to identify changes in lifestyle to reduce recurrence of condition will improve Ability to verbalize feelings will improve Ability to demonstrate self-control will improve Ability to identify and develop effective coping behaviors will improve Ability to maintain clinical measurements within normal limits will improve Compliance with prescribed medications will improve Ability to identify changes in lifestyle to reduce recurrence of condition will improve Ability to verbalize feelings will improve Ability to demonstrate self-control will improve Ability to identify and develop effective coping behaviors will improve Ability to maintain clinical measurements within normal limits will improve Compliance with prescribed medications will improve     Medication Management: Evaluate patient's response, side effects, and tolerance of medication regimen.  Therapeutic Interventions: 1 to 1 sessions, Unit Group sessions and Medication administration.  Evaluation of Outcomes: Adequate for Discharge   RN Treatment Plan for Primary Diagnosis: Bipolar affective disorder (HCC) Long Term Goal(s): Knowledge of disease and therapeutic regimen to maintain health will improve  Short Term Goals: Ability to participate in decision making will improve and Compliance with prescribed medications will improve  Medication Management: RN will administer medications as ordered by provider, will assess and evaluate patient's response and provide education to patient for prescribed medication. RN will report any  adverse and/or side effects to prescribing provider.  Therapeutic Interventions: 1 on 1 counseling sessions, Psychoeducation, Medication administration, Evaluate responses to treatment, Monitor vital signs and CBGs as ordered, Perform/monitor CIWA, COWS, AIMS and Fall Risk screenings as ordered, Perform wound care treatments as ordered.  Evaluation of Outcomes: Adequate for Discharge   LCSW Treatment Plan for Primary Diagnosis: Bipolar affective disorder [facility]) Long Term Goal(s): Safe transition to appropriate next level of care at discharge, Engage patient in therapeutic group addressing interpersonal concerns.  Short Term Goals: Increase social support and Increase skills for wellness and recovery  Therapeutic Interventions: Assess for all discharge needs, 1 to 1 time with Social worker, Explore available resources and support systems, Assess for adequacy in community support network, Educate family and significant other(s) on suicide prevention, Complete Psychosocial Assessment, Interpersonal group therapy.  Evaluation of Outcomes: Adequate for Discharge   Progress in Treatment: Attending groups: Yes. Participating in groups: Yes. Taking medication as prescribed: Yes. Toleration medication: Yes. Family/Significant other contact made: Yes, individual(s) contacted:  mother, [name]. Patient understands diagnosis: Yes. Discussing patient identified problems/goals with staff: Yes. Medical problems stabilized or resolved: Yes. Denies suicidal/homicidal ideation: Yes. Issues/concerns per patient self-inventory: No. Other: none   New problem(s) identified: None  New Short Term/Long Term Goal(s):  detox, medication management for mood stabilization; elimination of SI thoughts; development of comprehensive mental wellness/sobriety plan  Patient Goals:  "Make it out of here properly"  Discharge Plan or Barriers: Plans to receive  services through [facility] once discharged. Plans to return home to live with mother.  Reason for Continuation of Hospitalization: Medication stabilization  Estimated Length of Stay: Patient to be discharged this date.   Attendees: Patient:  02/08/2020   Physician:  02/08/2020   Nursing:  02/08/2020   RN Care Manager: 02/08/2020  Social Worker: Ruthann Cancer, LCSWA  02/08/2020  Recreational Therapist:  02/08/2020   Other:  02/08/2020   Other:  02/08/2020  Other: 02/08/2020     Scribe for Treatment Team: Otelia Santee, LCSWA 02/08/2020 9:24 AM

## 2020-02-08 NOTE — Progress Notes (Signed)
RN met with pt and reviewed pt's discharge instructions.  Pt verbalized understanding of discharge instructions and pt did not have any questions. RN reviewed and provided pt with a copy of SRA, AVS and Transition Record.  RN returned pt's belongings to pt.  Prescriptions were given to pt. Pt denied SI and HI and voiced no concerns.  Pt was appreciative of the care pt received at [facility].  Patient discharged to the lobby where mom was waiting to take pt home.

## 2020-02-08 NOTE — Discharge Summary (Signed)
Physician Discharge Summary Note  Patient:  [name] is an 34 y.o., female  MRN:  [id number]  DOB:  19-Aug-1986  Patient phone:  [phone] (home)   Patient address:   [address] [place] [address],   Total Time spent with patient: Greater than 30 minutes  Date of Admission:  01/29/2020  Date of Discharge: 02-08-20  Reason for Admission: Worsening mania.  Principal Problem: Schizophrenia, paranoid type [facility])  Discharge Diagnoses: Patient Active Problem List   Diagnosis Date Noted   Schizophrenia, paranoid type (HCC) [F20.0] 07/14/2017    Priority: High   Bipolar affective disorder (HCC) [F31.9] 01/29/2020   Somatic dysfunction of pelvic region [M99.05] 10/26/2014   Paranoia (HCC) [F22]    Schizoaffective disorder, bipolar type (HCC) [F25.0] 03/11/2012   Past Psychiatric History: Schizophrenia  Past Medical History:  Past Medical History:  Diagnosis Date   Bipolar 1 disorder (HCC) 2011   Paranoia (HCC)    Schizoaffective disorder (HCC)    History reviewed. No pertinent surgical history.  Family History:  Family History  Problem Relation Age of Onset   Cancer Father        lymphoma   Hypertension Mother    Diabetes Neg Hx    Heart disease Neg Hx    Family Psychiatric  History: See H&P  Social History:  Social History   Substance and Sexual Activity  Alcohol Use No   Comment: Pt denies      Social History   Substance and Sexual Activity  Drug Use No   Comment: Pt denies    Social History   Socioeconomic History   Marital status: Single    Spouse name: Not on file   Number of children: 0   Years of education: some colle   Highest education level: Not on file  Occupational History   Occupation: [name]     Comment: trying to start   Tobacco Use   Smoking status: Never Smoker   Smokeless tobacco: Never Used  Vaping Use   Vaping Use: Never used  Substance and Sexual Activity   Alcohol use: No    Comment: Pt  denies    Drug use: No    Comment: Pt denies   Sexual activity: Not Currently    Birth control/protection: Abstinence, None  Other Topics Concern   Not on file  Social History Narrative   Work or School: not working   Home Situation: lives with mother   Spiritual Beliefs: Christian   Lifestyle: no regular exercise; healthy diet         Social Determinants of Corporate investment banker Strain:    Difficulty of Paying Living Expenses:   Food Insecurity:    Worried About Programme researcher, broadcasting/film/video in the Last Year:    Barista in the Last Year:   Transportation Needs:    Freight forwarder (Medical):    Lack of Transportation (Non-Medical):   Physical Activity:    Days of Exercise per Week:    Minutes of Exercise per Session:   Stress:    Feeling of Stress :   Social Connections:    Frequency of Communication with Friends and Family:    Frequency of Social Gatherings with Friends and Family:    Attends Religious Services:    Active Member of Clubs or Organizations:    Attends Banker Meetings:    Marital Status:    Hospital Course: (Per Md's admission evaluation notes):  Patient is a 34 year old female with a past psychiatric history significant for schizoaffective disorder; bipolar type versus bipolar disorder type I who presented to the [facility] emergency department on 01/27/2020 with mania. The patient was noted on admission to be hyperverbal, and her speech was pressured. The assessment was unable to really determine what had taken place. Attempts to contact the patient's mother with whom she lives were not successful in the emergency department. On examination today the patient speech is not hyperverbal per se or pressured but is very disorganized. Review of the electronic medical record revealed that the patient had been previously diagnosed with manic behavior as well as paranoid schizophrenia. Her last psychiatric  hospitalization at our facility was in April 2020. Her discharge medications at that time included Cogentin, Tegretol, Haldol and Haldol Decanoate. Review of the electronic medical record revealed that it appeared the last time she had gotten a Haldol Decanoate injection was on 09/13/2019. She had also been taking Tegretol at that time. She was admitted to the hospital for evaluation and stabilization.  This is one of several psychiatric discharge summaries for this 34 year old AA female with hx of chronic mental illness & multiple psychiatric admissions. She is known in this [facility] & other psychiatric hospitals within the surrounding areas for worsening symptoms Schizophrenia & mania. She has been tried on multiple psychotropic medications for her symptoms & it appears nothing has actually been helpful in stabilizing her symptoms. She was brought to the [facility] this time around for evaluation & treatment for worsening mania.  After evaluation of her presenting symptoms, Corlene was recommended for mood stabilization treatments. The medication regimen for her presenting symptoms were discussed & with her consent initiated. She received, stabilized & was discharged on the medications as listed below on her discharge medication lists. She was also enrolled but seldom participated in the group counseling sessions being offered & held on this unit. She was to learn coping skills. She presented with on this admission, other chronic medical conditions that required treatment & monitoring. She was resumed/discharged on all her pertinent home medications for those health issues. She tolerated her treatment regimen without any adverse effects or reactions reported.  And because of the chronic nature of her psychiatric symptoms & their resistance to the treatment, Glady was treated, stabilized & being discharged on two separate antipsychotic medications (Haldol & Risperdal). This is because she has not been able to achieve  symptoms control under an antipsychotic monotherapy. These two combination antipsychotic therapies seem effective in stabilizing her symptoms at this time. It will benefit Evoleth to continue on these combination antipsychotic therapies as recommended. However, as her symptoms continue to improve, she may then be titrated down to an antipsychotic monotherapy. This is to prevent the chances for the development of metabolic syndrome usually associated with use of multiple antipsychotic regimen. This has to be done within the proper monitoring, discretion & judgement of her outpatient psychiatric provider.  During the course of her hospitalization, the 15-minute checks were adequate to ensure Ilda's safety.  Patient did not display any dangerous, violent or suicidal behavior on the unit. However, she was noted be be highly paranoid as a result will question her treatment recommendations & other times will try to refuse to take them. But, with encouragement, she did comply to taking her medications. She interacted with the other patient & staff to some extent. She seldom participated in the group sessions/therapies. Her medications were addressed & adjusted to meet  her needs. She was recommended for outpatient follow-up care & medication management upon discharge to assure her continuity of care.  At the time of discharge, patient is not reporting any acute suicidal/homicidal ideations. She currently denies any new issues or concerns. Education and supportive counseling provided throughout her hospital stay & upon discharge.  Today upon her discharge evaluation with the attending psychiatrist, Yardley presents much stable than when first admitted to the hospital. She denies any other specific concerns. She is sleeping well. Her appetite is good. She denies other physical complaints. She denies AH/VH. She feels that her medications have been helpful & is in agreement to continue her current treatment regimen as  recommended. She was able to engage in safety planning including [facility] or contact emergency services if she feels unable to maintain her own safety or the safety of others. Pt had no further questions, comments, or concerns. [facility] in no apparent distress. Transportation per her mother .  Physical Findings: AIMS: Facial and Oral Movements Muscles of Facial Expression: None, normal Lips and Perioral Area: None, normal Jaw: None, normal Tongue: None, normal,Extremity Movements Upper (arms, wrists, hands, fingers): None, normal Lower (legs, knees, ankles, toes): None, normal, Trunk Movements Neck, shoulders, hips: None, normal, Overall Severity Severity of abnormal movements (highest score from questions above): None, normal Incapacitation due to abnormal movements: None, normal Patient's awareness of abnormal movements (rate only patient's report): No Awareness, Dental Status Current problems with teeth and/or dentures?: No Does patient usually wear dentures?: No  CIWA:    COWS:     Musculoskeletal: Strength & Muscle Tone: within normal limits Gait & Station: normal Patient leans: N/A  Psychiatric Specialty Exam: Physical Exam  Constitutional: She appears well-developed.  HENT:  Head: Normocephalic.  Eyes: Pupils are equal, round, and reactive to light.  Cardiovascular: Normal rate.  Respiratory: Effort normal.  GI: Soft.  Genitourinary:    Genitourinary Comments: Deferred   Musculoskeletal:        General: Normal range of motion.     Cervical back: Normal range of motion.  Neurological: She is alert.  Skin: Skin is warm.    Review of Systems  Constitutional: Negative.   HENT: Negative.   Eyes: Negative.   Respiratory: Negative.   Cardiovascular: Negative.   Gastrointestinal: Negative.   Genitourinary: Negative.   Musculoskeletal: Negative.   Skin: Negative.   Neurological: Negative.   Endo/Heme/Allergies: Negative.    Psychiatric/Behavioral: Positive for depression (Stabilized with medication priro to discharge) and hallucinations (Hx. psychosis). Negative for memory loss, substance abuse and suicidal ideas. The patient has insomnia (Stabilized with medication prior to discharge). The patient is not nervous/anxious.     Blood pressure 113/77, pulse 89, temperature 98.7 F (37.1 C), temperature source Oral, resp. rate 20, height 5\' 3"  (1.6 m), weight 60.8 kg, SpO2 100 %.Body mass index is 23.74 kg/m.  See Md's SRA      Has this patient used any form of tobacco in the last 30 days? (Cigarettes, Smokeless Tobacco, Cigars, and/or Pipes):  N/A  Blood Alcohol level:  Lab Results  Component Value Date   ETH <10 01/26/2020   ETH <10 11/27/2018   Metabolic Disorder Labs:  Lab Results  Component Value Date   HGBA1C 5.5 11/02/2017   MPG 111.15 11/02/2017   No results found for: PROLACTIN Lab Results  Component Value Date   CHOL 179 07/27/2014   TRIG 49 07/27/2014   HDL 44 07/27/2014  CHOLHDL 5 06/21/2014   VLDL 10 07/27/2014   LDLCALC 125 (H) 07/27/2014   LDLCALC 100 (H) 06/21/2014   See Psychiatric Specialty Exam and Suicide Risk Assessment completed by Attending Physician prior to discharge.  Discharge destination:  Home  Is patient on multiple antipsychotic therapies at discharge:  Yes,   Do you recommend tapering to monotherapy for antipsychotics?  Yes   Has Patient had three or more failed trials of antipsychotic monotherapy by history:  Yes,   Antipsychotic medications that previously failed include:   1.  Haldol., 2.  Invega. and 3.  Geodon.  Recommended Plan for Multiple Antipsychotic Therapies: Additional reason(s) for multiple antispychotic treatment: This patient has not been able to achieve symptoms control under an antipsychotic monotherapy making it necessary to add another antipsychotic therapy. The combination of the current two antipsychotic therapies (Risperdal & Haldol) has  proved effective in stabilizing patient's symptoms leading to this discharge.  Allergies as of 02/08/2020      Reactions   Quetiapine Other (See Comments)   Facial skin irritation   Yes For Women [intimacy Products] Rash   Zyprexa [olanzapine] Rash      Medication List    STOP taking these medications   carbamazepine 200 MG tablet Commonly known as: TEGRETOL Replaced by: carbamazepine 100 MG chewable tablet   haloperidol decanoate 100 MG/ML injection Commonly known as: HALDOL DECANOATE   OVER THE COUNTER MEDICATION     TAKE these medications     Indication  amLODipine 5 MG tablet Commonly known as: NORVASC Take 1 tablet (5 mg total) by mouth daily. For high blood pressure Start taking on: February 09, 2020  Indication: High Blood Pressure Disorder   benztropine 0.5 MG tablet Commonly known as: COGENTIN Take 1 tablet (0.5 mg total) by mouth 2 (two) times daily. For prevention of drug induced tremors What changed:   medication strength  how much to take  additional instructions  Indication: Extrapyramidal Reaction caused by Medications   carbamazepine 100 MG chewable tablet Commonly known as: TEGRETOL Chew 3 tablets (300 mg total) by mouth 2 (two) times daily. For mood stabilization Replaces: carbamazepine 200 MG tablet  Indication: Mood stabilization   haloperidol 5 MG tablet Commonly known as: HALDOL Take 1 tablet (5 mg total) by mouth 3 (three) times daily. For mood control What changed:   medication strength  how much to take  when to take this  additional instructions  Indication: Mood control   Multivitamin Adult Tabs Take 1 tablet by mouth daily.  Indication: Vitamin Deficiency   risperidone 3 MG disintegrating tablet Commonly known as: RISPERDAL M-TABS Take 1 tablet (3 mg total) by mouth 2 (two) times daily. For mood control  Indication: Mood control       Follow-up Information    Coun[facility]. Call.   Why: Please contact  this provider for information regarding establishing therapy services.   Contact information: [address] [place] [address] [place]. Go on 02/16/2020.   Specialty: Behavioral Health Why: You have an appointment for therapy on 02/16/20 at 1:00 pm.  This appointment will be held in person. Contact information: [place] [phone]             Follow-up recommendations: Activity:  As tolerated Diet: As recommended by your primary care doctor. Keep all scheduled follow-up appointments as recommended.  Comments: Prescriptions given at discharge.  Patient agreeable to plan.  Given opportunity to ask questions.  Appears to feel comfortable with discharge denies any current suicidal or homicidal thought. Patient is also instructed prior to discharge to: Take all medications as prescribed by his/her mental healthcare provider. Report any adverse effects and or reactions from the medicines to his/her outpatient provider promptly. Patient has been instructed & cautioned: To not engage in alcohol and or illegal drug use while on prescription medicines. In the event of worsening symptoms, patient is instructed to call the crisis hotline, 911 and or go to the nearest ED for appropriate evaluation and treatment of symptoms. To follow-up with his/her primary care provider for your other medical issues, concerns and or health care needs.  Signed: Armandina Stammer, NP, PMHNP, FNP-BC 02/08/2020, 10:50 AM

## 2020-02-08 NOTE — Progress Notes (Signed)
  [facility] Adult Case Management Discharge Plan :  Will you be returning to the same living situation after discharge:  Yes,  to return home with mother. At discharge, do you have transportation home?: Yes,  mother to pick pt up. Do you have the ability to pay for your medications: Yes,  has insurance.   Release of information consent forms completed and in the chart;  Patient's signature needed at discharge.  Patient to Follow up at:  Follow-up Information    Counseling, [facility]. Call.   Why: Please contact this provider for information regarding establishing therapy services.   Contact information: [address] [place] [address] [place] [address] [phone]        [facility]. Go on 02/16/2020.   Specialty: Behavioral Health Why: You have an appointment for therapy on 02/16/20 at 1:00 pm.  This appointment will be held in person. Contact information: [address] [place] [address] [phone]              Next level of care provider has access to [facility] Link:yes  Safety Planning and Suicide Prevention discussed: Yes,  with mother.     Has patient been referred to the Quitline?: Patient refused referral  Patient has been referred for addiction treatment: N/A  Otelia Santee, LCSWA 02/08/2020, 9:18 AM

## 2020-02-16 ENCOUNTER — Ambulatory Visit (HOSPITAL_COMMUNITY): Payer: Medicaid Other | Admitting: Clinical

## 2022-08-01 ENCOUNTER — Encounter (HOSPITAL_COMMUNITY): Payer: Self-pay

## 2022-08-01 ENCOUNTER — Emergency Department (HOSPITAL_COMMUNITY)
Admission: EM | Admit: 2022-08-01 | Discharge: 2022-08-01 | Discharge status: Against Medical Advice | Payer: 59 | Attending: Emergency Medicine | Admitting: Emergency Medicine

## 2022-08-01 ENCOUNTER — Other Ambulatory Visit: Payer: Self-pay

## 2022-08-01 DIAGNOSIS — F23 Brief psychotic disorder: Secondary | ICD-10-CM | POA: Insufficient documentation

## 2022-08-01 DIAGNOSIS — F309 Manic episode, unspecified: Secondary | ICD-10-CM | POA: Diagnosis not present

## 2022-08-01 DIAGNOSIS — R69 Illness, unspecified: Secondary | ICD-10-CM | POA: Diagnosis not present

## 2022-08-01 DIAGNOSIS — R462 Strange and inexplicable behavior: Secondary | ICD-10-CM | POA: Diagnosis not present

## 2022-08-01 DIAGNOSIS — F250 Schizoaffective disorder, bipolar type: Secondary | ICD-10-CM | POA: Diagnosis present

## 2022-08-01 LAB — RAPID URINE DRUG SCREEN, HOSP PERFORMED
Amphetamines: NOT DETECTED
Barbiturates: NOT DETECTED
Benzodiazepines: NOT DETECTED
Cocaine: NOT DETECTED
Opiates: NOT DETECTED
Tetrahydrocannabinol: NOT DETECTED

## 2022-08-01 MED ORDER — RISPERIDONE 0.5 MG PO TBDP: 1.0000 mg | ORAL_TABLET | Freq: Two times a day (BID) | ORAL | Status: DC

## 2022-08-01 NOTE — ED Provider Notes (Addendum)
[facility]-EMERGENCY DEPT Provider Note   CSN: [id number] Arrival date & time: 08/01/22  1431     History  Chief Complaint  Patient presents with   Psychiatric Evaluation    [name] is a 36 y.o. female brought to the ED by [employer] due to erratic behavior at the [employer] building she lives across the street from.  Patient has history significant for schizoaffective disorder, bipolar type and paranoia.  She is manic and is not answering medical or psychiatric questions.  Patient continues to request a chaplain to speak with regarding her spiritual needs.  Patient admits to having "spiritual visions and hallucinations".  She does not answer SI or HI questions directly, only continues to speak of religion.  Denies medical complaint.        Home Medications Prior to Admission medications   Medication Sig Start Date End Date Taking? Authorizing Provider  amLODipine (NORVASC) 5 MG tablet Take 1 tablet (5 mg total) by mouth daily. For high blood pressure 02/09/20   Nwoko, Nicole Kindred I, NP  benztropine (COGENTIN) 0.5 MG tablet Take 1 tablet (0.5 mg total) by mouth 2 (two) times daily. For prevention of drug induced tremors 02/08/20   Armandina Stammer I, NP  carbamazepine (TEGRETOL) 100 MG chewable tablet Chew 3 tablets (300 mg total) by mouth 2 (two) times daily. For mood stabilization 02/08/20   Armandina Stammer I, NP  haloperidol (HALDOL) 5 MG tablet Take 1 tablet (5 mg total) by mouth 3 (three) times daily. For mood control 02/08/20   Armandina Stammer I, NP  Multiple Vitamin (MULTIVITAMIN ADULT) TABS Take 1 tablet by mouth daily.    [provider]  risperidone (RISPERDAL M-TABS) 3 MG disintegrating tablet Take 1 tablet (3 mg total) by mouth 2 (two) times daily. For mood control 02/08/20   Armandina Stammer I, NP      Allergies    Quetiapine, Yes for women [intimacy products], and Zyprexa [olanzapine]    Review of Systems   Review of Systems  Unable to perform ROS: Psychiatric disorder     Physical Exam Updated Vital Signs BP (!) 142/73 (BP Location: Left Arm)   Pulse (!) 102   Temp 98.7 F (37.1 C) (Oral)   Resp 16   Ht 5\' 4"  (1.626 m)   Wt 56.2 kg   SpO2 98%   BMI 21.28 kg/m  Physical Exam Vitals and nursing note reviewed.  Constitutional:      General: She is not in acute distress.    Appearance: Normal appearance. She is not ill-appearing or diaphoretic.  Pulmonary:     Effort: Pulmonary effort is normal.  Neurological:     Mental Status: She is alert. Mental status is at baseline.  Psychiatric:        Attention and Perception: She perceives auditory and visual hallucinations.        Speech: Speech is rapid and pressured and tangential.        Behavior: Behavior is uncooperative and hyperactive. Behavior is not aggressive or combative.        Thought Content: Thought content is paranoid and delusional.     Comments: Patient sees and hears "spirits".  She does not answer SI/HI questions directly.      ED Results / Procedures / Treatments   Labs (all labs ordered are listed, but only abnormal results are displayed) Labs Reviewed  SARS CORONAVIRUS 2 BY RT PCR  COMPREHENSIVE METABOLIC PANEL  ETHANOL  RAPID URINE DRUG SCREEN, HOSP  PERFORMED  CBC WITH DIFFERENTIAL/PLATELET  I-STAT BETA HCG BLOOD, ED ([facility], [facility], [facility] ONLY)    EKG None  Radiology No results found.  Procedures Procedures    Medications Ordered in ED Medications - No data to display  ED Course/ Medical Decision Making/ A&P                           Medical Decision Making Amount and/or Complexity of Data Reviewed Labs: ordered.   This patient presents to the ED with chief complaint(s) of mania with delusions and "spiritual hallucinations" with pertinent past medical history of schizoaffective disorder, bipolar type with previous IVCs due to mania and paranoia .The complaint involves an extensive differential diagnosis and also carries with it a high risk of complications and  morbidity.    The differential diagnosis includes acute psychosis,    The initial plan is to place patient under IVC, consult TTS and order labs for medical clearance  Additional history obtained: Records reviewed previous admission documents  Initial Assessment:   On initial exam, patient is sitting in triage room, appears anxious, has pressured speech and asks why she has not yet seen a chaplain after requesting one.  Patient does not answer medical or psychiatric questions.  Denies medical complaint, states she needs to speak to someone about "seeing and hearing spirits" and continues to request a chaplain or a transfer to a [facility].  Patient only speaks of religion, does not seem acutely aware of situation, however, she is oriented to place.  Patient appears manic.  Independent ECG/labs interpretation:  Lab Orders         SARS Coronavirus 2 by RT PCR (hospital order, performed in [facility] hospital lab) *cepheid single result test* Anterior Nasal Swab         Comprehensive metabolic panel         Ethanol         Urine rapid drug screen (hosp performed)         CBC with Diff         I-Stat beta hCG blood, ED      Disposition:   Patient presents with mania and acute psychosis.  She has history significant for schizoaffective disorder, bipolar type and does not appear to be taking daily medications.  She has pressured, tangential speech and does not answer questions directly.  Patient only speaks of religion and needing a chaplain.  I believe based on patient's presentation and history, she will benefit from IVC and a psychiatric evaluation.  Appropriate consults and lab orders were placed.  Attempted to IVC patient, was denied by magistrate due to patient not acutely SI/HI.  Patient was evaluated by psychiatry and agreed she is acutely psychotic.  Patient is not actively attempting to leave, will try to obtain medical clearance on patient and reevaluate.    Patient was  evaluated by Nira Conn, NP with psychiatry who recommended inpatient psychiatric admission when medically cleared.  See his note for more detail.  Patient refusing all laboratory work up and physical exam.  Informed by RN that patient is requesting to leave with "discharge paperwork in hand".  Patient is experiencing acute psychosis, recommended to be admitted for psychiatric evaluation/treatment, but is not under IVC due to being declined by magistrate and is leaving the hospital AMA.  Patient informed she is leaving against medical advice and is still choosing to leave at this time.  Final Clinical Impression(s) / ED Diagnoses Final diagnoses:  Acute psychosis (HCC)  [facility])    Rx / DC Orders ED Discharge Orders     None         Lenard Simmer, PA 08/01/22 1635    Lenard Simmer, [place] 08/01/22 1838    Tegeler, Canary Brim, MD 08/01/22 2312

## 2022-08-01 NOTE — ED Notes (Signed)
Unable to change out pt.

## 2022-08-01 NOTE — ED Triage Notes (Signed)
Patient was  brought in by [employer].  Patient is manic and states she is "all about the numbers."  Patient requests many times to see the chaplain.  Patient denies suicidal thoughts, but states she needs to pray.

## 2022-08-01 NOTE — ED Notes (Addendum)
Pt continues to refuse labs and to be changed out into purple scrubs.

## 2022-08-01 NOTE — Progress Notes (Signed)
Patient is not is not medically cleared for TTS consult.

## 2022-08-01 NOTE — ED Notes (Signed)
Pt refusing labs- states "That's not feasible at this time"

## 2022-08-01 NOTE — Consult Note (Cosign Needed Addendum)
BH ED ASSESSMENT   Reason for Consult:  Erratic Behavior Referring Physician:  Melton Alar, PA-C  Patient Identification: [name] MRN:  [id number] ED Chief Complaint: Schizoaffective disorder, bipolar type (HCC)  Diagnosis:  Principal Problem:   Schizoaffective disorder, bipolar type [facility])   ED Assessment Time Calculation: Start Time: 1650 Stop Time: 1510 Total Time in Minutes (Assessment Completion): 1340   Subjective:   [name] is a 36 y.o. female brought to the ED by [employer] due to erratic behavior at the [employer] building she lives across the street from.  Patient has history significant for schizoaffective disorder, bipolar type and paranoia.     HPI:   Patient is sitting in chair, constantly fidgeting with her fingers and shaking her legs.  Eye contact is fleeting.  She is disheveled.  She is very malodorous.  Speech is clear, but she is very difficult to follow due to tangentiality and pressured speech.  She makes several comments about religion.  When asked about auditory hallucinations she states that she talks to the omnipresent who she states is the [employer].  When asked about visual hallucinations, she makes the same statement about talking to the omnipresent.  She discusses her father passing away in 2009 due to cancer.  Then she immediately starts making statements about her younger sister.  She denies suicidal ideations and states "I want to live as long as possible."  When asked about homicidal ideations states no and starts discussing religion again.  Patient states that she does not currently take any prescribed medications.  She states that she takes supplements for "female issues."  When asked about illicit substance use, she makes a statement about being some type of religious being and being immune to illicit substances.  She states that she lives in [place] with her mother.  Patient has a history of schizoaffective disorder bipolar type.  On chart review, it  appears that she may have been lost to follow-up for the past 2 years.  She was last inpatient at [facility] in June 2021.  At that time she was discharged on carbamazepine 300 mg twice daily, haloperidol 5 mg 3 times daily, risperidone 3 mg twice daily, and benztropine 0.5 mg twice daily.  On review of PTA medications, the above medications are the only psychotropics listed.  Attempted to contact the patient's mother, [name] [phone], call went straight to voicemail left HIPAA compliant message.  Past Psychiatric History: Schizoaffective Disorder, Bipolar Type  Risk to Self or Others: Is the patient at risk to self? Yes Has the patient been a risk to self in the past 6 months? No Has the patient been a risk to self within the distant past? Yes Is the patient a risk to others? No Has the patient been a risk to others in the past 6 months? No Has the patient been a risk to others within the distant past? No  [place] Scale:  Flowsheet Row ED from 08/01/2022 in [facility]) [facility]  C-SSRS RISK CATEGORY No Risk No Risk No Risk       AIMS:  , , ,  ,   ASAM:    Substance Abuse:     Past Medical History:  Past Medical History:  Diagnosis Date   Bipolar 1 disorder (HCC) 2011   Paranoia (HCC)    Schizoaffective disorder (  HCC)    History reviewed. No pertinent surgical history. Family History:  Family History  Problem Relation Age of Onset   Cancer Father        lymphoma   Hypertension Mother    Diabetes Neg Hx    Heart disease Neg Hx     Social History:  Social History   Substance and Sexual Activity  Alcohol Use No   Comment: Pt denies      Social History   Substance and Sexual Activity  Drug Use No   Comment: Pt denies    Social History   Socioeconomic History    Marital status: Single    Spouse name: Not on file   Number of children: 0   Years of education: some colle   Highest education level: Not on file  Occupational History   Occupation: [name]     Comment: trying to start   Tobacco Use   Smoking status: Never   Smokeless tobacco: Never  Vaping Use   Vaping Use: Never used  Substance and Sexual Activity   Alcohol use: No    Comment: Pt denies    Drug use: No    Comment: Pt denies   Sexual activity: Not Currently    Birth control/protection: Abstinence, None  Other Topics Concern   Not on file  Social History Narrative   Work or School: not working   Home Situation: lives with mother   Spiritual Beliefs: Christian   Lifestyle: no regular exercise; healthy diet         Social Determinants of Health   Financial Resource Strain: Not on file  Food Insecurity: Not on file  Transportation Needs: Not on file  Physical Activity: Not on file  Stress: Not on file  Social Connections: Not on file   Additional Social History:    Allergies:   Allergies  Allergen Reactions   Quetiapine Other (See Comments)    Facial skin irritation   Yes For Women [Intimacy Products] Rash   Zyprexa [Olanzapine] Rash    Labs: No results found for this or any previous visit (from the past 48 hour(s)).  No current facility-administered medications for this encounter.   Current Outpatient Medications  Medication Sig Dispense Refill   amLODipine (NORVASC) 5 MG tablet Take 1 tablet (5 mg total) by mouth daily. For high blood pressure 10 tablet 0   benztropine (COGENTIN) 0.5 MG tablet Take 1 tablet (0.5 mg total) by mouth 2 (two) times daily. For prevention of drug induced tremors 60 tablet 0   carbamazepine (TEGRETOL) 100 MG chewable tablet Chew 3 tablets (300 mg total) by mouth 2 (two) times daily. For mood stabilization 90 tablet 1   haloperidol (HALDOL) 5 MG tablet Take 1 tablet (5 mg total) by mouth 3 (three) times daily. For mood control 90  tablet 0   Multiple Vitamin (MULTIVITAMIN ADULT) TABS Take 1 tablet by mouth daily.     risperidone (RISPERDAL M-TABS) 3 MG disintegrating tablet Take 1 tablet (3 mg total) by mouth 2 (two) times daily. For mood control 60 tablet 0    Musculoskeletal: Strength & Muscle Tone: within normal limits Gait & Station: normal Patient leans: N/A   Psychiatric Specialty Exam: Presentation  General Appearance:  Disheveled  Eye Contact: Fleeting  Speech: Clear and Coherent; Pressured  Speech Volume: Normal  Handedness: Right   Mood and Affect  Mood: Euthymic  Affect: Non-Congruent   Thought Process  Thought Processes: Disorganized; Irrevelant  Descriptions of Associations:Tangential  Orientation:Partial  Thought Content:Illogical  History of Schizophrenia/Schizoaffective disorder:No data recorded Duration of Psychotic Symptoms:No data recorded Hallucinations:Hallucinations: Other (comment) (patient states that she talk to the omnipresent when asked about AVH)  Ideas of Reference:Delusions  Suicidal Thoughts:Suicidal Thoughts: No  Homicidal Thoughts:Homicidal Thoughts: No   Sensorium  Memory: Immediate Poor; Recent Poor; Remote Poor  Judgment: Impaired  Insight: Lacking   Executive Functions  Concentration: Poor  Attention Span: Poor  Recall: Poor  Fund of Knowledge: Poor  Language: Poor   Psychomotor Activity  Psychomotor Activity: Psychomotor Activity: Restlessness   Assets  Assets: Physical Health    Sleep  Sleep:No data recorded  Physical Exam: Physical Exam Vitals and nursing note reviewed.  Constitutional:      General: She is not in acute distress.    Appearance: She is not ill-appearing, toxic-appearing or diaphoretic.  Eyes:     General:        Right eye: No discharge.        Left eye: No discharge.  Cardiovascular:     Rate and Rhythm: Normal rate.  Pulmonary:     Effort: Pulmonary effort is normal.   Musculoskeletal:        General: Normal range of motion.     Cervical back: Normal range of motion.  Neurological:     Mental Status: She is alert.    Review of Systems  Respiratory:  Negative for cough.   Cardiovascular:  Negative for chest pain.  Gastrointestinal:  Negative for nausea.  Psychiatric/Behavioral:  Negative for depression and suicidal ideas.    Blood pressure (!) 142/73, pulse (!) 102, temperature 98.7 F (37.1 C), temperature source Oral, resp. rate 16, height 5\' 4"  (1.626 m), weight 56.2 kg, SpO2 98 %. Body mass index is 21.28 kg/m.  Medical Decision Making: Roxene Alviar Lighty is a 36 y.o. female brought to the ED by [employer] due to erratic behavior at the [employer] building she lives across the street from.  Patient has history significant for schizoaffective disorder, bipolar type and paranoia.   Patient has decompensated and could benefit from restarting medications and inpatient psychiatric treatment.  The EDP has attempted to IVC patient but this has been declined by the magistrate  Patient is refusing labs and other procedures.  Plan to restart Risperdal M tabs at 1 mg twice daily for schizoaffective disorder   Disposition: Recommend psychiatric Inpatient admission when medically cleared.  31, NP 08/01/2022 5:17 PM

## 2022-08-01 NOTE — ED Provider Triage Note (Cosign Needed Addendum)
Emergency Medicine Provider Triage Evaluation Note  [name] , a 36 y.o. female  was evaluated in triage.  Pt complains of delusional. She lives [place] apartments from the [employer], was brought in by [facility] for erratic behavior. Patient is requesting to talk to a chaplain and to see a spiritual counselor and also mental health support. She is tangential yelling at the staff present    Review of Systems  Positive: N/A Negative: N/S  Physical Exam  There were no vitals taken for this visit. Gen:   Awake, no distress   Resp:  Normal effort  MSK:   Moves extremities without difficulty  Other:  Not cooperative, yelling for resources, tangential with rapid a speech with manic episode  Medical Decision Making  Medically screening exam initiated at 2:47 PM.  Appropriate orders placed.  Andrell P Uplinger was informed that the remainder of the evaluation will be completed by another provider, this initial triage assessment does not replace that evaluation, and the importance of remaining in the ED until their evaluation is complete.     Claude Manges, PA-C 08/01/22 1450    Claude Manges, PA-C 08/01/22 1452

## 2022-12-22 DIAGNOSIS — F209 Schizophrenia, unspecified: Secondary | ICD-10-CM | POA: Diagnosis not present

## 2022-12-22 DIAGNOSIS — F250 Schizoaffective disorder, bipolar type: Secondary | ICD-10-CM | POA: Diagnosis not present

## 2022-12-23 DIAGNOSIS — R9431 Abnormal electrocardiogram [ECG] [EKG]: Secondary | ICD-10-CM | POA: Diagnosis not present

## 2022-12-23 DIAGNOSIS — F209 Schizophrenia, unspecified: Secondary | ICD-10-CM | POA: Diagnosis not present

## 2022-12-24 DIAGNOSIS — F209 Schizophrenia, unspecified: Secondary | ICD-10-CM | POA: Diagnosis not present

## 2022-12-25 DIAGNOSIS — F209 Schizophrenia, unspecified: Secondary | ICD-10-CM | POA: Diagnosis not present

## 2022-12-26 DIAGNOSIS — F209 Schizophrenia, unspecified: Secondary | ICD-10-CM | POA: Diagnosis not present

## 2022-12-27 DIAGNOSIS — F209 Schizophrenia, unspecified: Secondary | ICD-10-CM | POA: Diagnosis not present

## 2022-12-28 DIAGNOSIS — F209 Schizophrenia, unspecified: Secondary | ICD-10-CM | POA: Diagnosis not present

## 2022-12-29 DIAGNOSIS — F209 Schizophrenia, unspecified: Secondary | ICD-10-CM | POA: Diagnosis not present

## 2022-12-30 DIAGNOSIS — F209 Schizophrenia, unspecified: Secondary | ICD-10-CM | POA: Diagnosis not present

## 2022-12-31 DIAGNOSIS — F209 Schizophrenia, unspecified: Secondary | ICD-10-CM | POA: Diagnosis not present

## 2023-01-12 DIAGNOSIS — F29 Unspecified psychosis not due to a substance or known physiological condition: Secondary | ICD-10-CM | POA: Diagnosis not present

## 2023-01-26 ENCOUNTER — Ambulatory Visit
Admission: EM | Admit: 2023-01-26 | Discharge: 2023-01-26 | Disposition: A | Discharge status: Home / Self Care | Payer: 59

## 2023-01-26 DIAGNOSIS — F29 Unspecified psychosis not due to a substance or known physiological condition: Secondary | ICD-10-CM | POA: Diagnosis not present

## 2023-01-26 NOTE — ED Triage Notes (Signed)
Pt brought to tx area by [employer], rad tech for VS-pt requested sports physical-was advised we do not perform sports physical and to f/u with her PCP-pt left UC in NAD

## 2023-01-30 DIAGNOSIS — F29 Unspecified psychosis not due to a substance or known physiological condition: Secondary | ICD-10-CM | POA: Diagnosis not present

## 2023-02-19 DIAGNOSIS — E7800 Pure hypercholesterolemia, unspecified: Secondary | ICD-10-CM | POA: Diagnosis not present

## 2023-02-19 DIAGNOSIS — F200 Paranoid schizophrenia: Secondary | ICD-10-CM | POA: Diagnosis not present

## 2023-02-19 DIAGNOSIS — Z029 Encounter for administrative examinations, unspecified: Secondary | ICD-10-CM | POA: Diagnosis not present

## 2023-02-19 DIAGNOSIS — D649 Anemia, unspecified: Secondary | ICD-10-CM | POA: Diagnosis not present

## 2023-02-27 DIAGNOSIS — F29 Unspecified psychosis not due to a substance or known physiological condition: Secondary | ICD-10-CM | POA: Diagnosis not present

## 2023-03-04 DIAGNOSIS — F29 Unspecified psychosis not due to a substance or known physiological condition: Secondary | ICD-10-CM | POA: Diagnosis not present

## 2023-03-27 DIAGNOSIS — F29 Unspecified psychosis not due to a substance or known physiological condition: Secondary | ICD-10-CM | POA: Diagnosis not present

## 2023-04-24 DIAGNOSIS — F29 Unspecified psychosis not due to a substance or known physiological condition: Secondary | ICD-10-CM | POA: Diagnosis not present

## 2023-05-07 ENCOUNTER — Other Ambulatory Visit: Payer: Self-pay

## 2023-05-07 ENCOUNTER — Emergency Department (HOSPITAL_COMMUNITY)
Admission: EM | Admit: 2023-05-07 | Discharge: 2023-05-07 | Disposition: A | Discharge status: Home / Self Care | Payer: MEDICAID | Attending: Emergency Medicine | Admitting: Emergency Medicine

## 2023-05-07 DIAGNOSIS — R519 Headache, unspecified: Secondary | ICD-10-CM | POA: Diagnosis not present

## 2023-05-07 DIAGNOSIS — Z79899 Other long term (current) drug therapy: Secondary | ICD-10-CM | POA: Diagnosis not present

## 2023-05-07 DIAGNOSIS — I10 Essential (primary) hypertension: Secondary | ICD-10-CM | POA: Diagnosis not present

## 2023-05-07 NOTE — ED Triage Notes (Signed)
Pt. Stated, I was told I have a soft VS and I need to know what to do.

## 2023-05-07 NOTE — Discharge Instructions (Signed)
Your exam today decreased urine.  Vital signs are normal.  For any concerning symptoms return to the emergency room.

## 2023-05-07 NOTE — ED Provider Notes (Signed)
[place] EMERGENCY DEPARTMENT AT [facility] Provider Note   CSN: [id number] Arrival date & time: 05/07/23  7829     History  Chief Complaint  Patient presents with   Hypertension    [name] is a 37 y.o. female.  37 year old female with past medical history significant for bipolar, schizoaffective disorder who reports compliance with Cog[employer] presents today to have her vital signs checked.  She states 2 days ago she had an episode of throwing up.  Has not been nauseous and has remained without another episode of throwing up since then.  No abdominal pain, dysuria.  She states today she had a falling out with someone and had a slight headache after.  Headache has since resolved.  But she states she wanted her blood pressure checked to ensure that it was not significantly elevated.  She denies SI, or HI.  No other complaints.  The history is provided by the patient. No language interpreter was used.       Home Medications Prior to Admission medications   Medication Sig Start Date End Date Taking? Authorizing Provider  amLODipine (NORVASC) 5 MG tablet Take 1 tablet (5 mg total) by mouth daily. For high blood pressure 02/09/20   Nwoko, Nicole Kindred I, NP  benztropine (COGENTIN) 0.5 MG tablet Take 1 tablet (0.5 mg total) by mouth 2 (two) times daily. For prevention of drug induced tremors 02/08/20   Armandina Stammer I, NP  carbamazepine (TEGRETOL) 100 MG chewable tablet Chew 3 tablets (300 mg total) by mouth 2 (two) times daily. For mood stabilization 02/08/20   Armandina Stammer I, NP  haloperidol (HALDOL) 5 MG tablet Take 1 tablet (5 mg total) by mouth 3 (three) times daily. For mood control 02/08/20   Armandina Stammer I, NP  Multiple Vitamin (MULTIVITAMIN ADULT) TABS Take 1 tablet by mouth daily.    [provider]  risperidone (RISPERDAL M-TABS) 3 MG disintegrating tablet Take 1 tablet (3 mg total) by mouth 2 (two) times daily. For mood control 02/08/20   Armandina Stammer I, NP       Allergies    Quetiapine, Yes for women [intimacy products], and Zyprexa [olanzapine]    Review of Systems   Review of Systems  Respiratory:  Negative for shortness of breath.   Cardiovascular:  Negative for chest pain and palpitations.  Gastrointestinal:  Negative for abdominal pain, nausea and vomiting.  Neurological:  Positive for headaches (now resolved). Negative for syncope and light-headedness.  All other systems reviewed and are negative.   Physical Exam Updated Vital Signs BP 127/84   Pulse 72   Temp 99 F (37.2 C)   Resp 19   Ht 5' 3.5" (1.613 m)   Wt 57.2 kg   LMP 05/04/2023   SpO2 100%   BMI 21.97 kg/m  Physical Exam Vitals and nursing note reviewed.  Constitutional:      General: She is not in acute distress.    Appearance: Normal appearance. She is not ill-appearing.  HENT:     Head: Normocephalic and atraumatic.     Nose: Nose normal.  Eyes:     Conjunctiva/sclera: Conjunctivae normal.  Cardiovascular:     Rate and Rhythm: Normal rate.  Pulmonary:     Effort: Pulmonary effort is normal. No respiratory distress.  Musculoskeletal:        General: No deformity. Normal range of motion.     Cervical back: Normal range of motion.  Skin:    Findings: No rash.  Neurological:     Mental Status: She is alert.  Psychiatric:        Mood and Affect: Mood normal.     ED Results / Procedures / Treatments   Labs (all labs ordered are listed, but only abnormal results are displayed) Labs Reviewed - No data to display  EKG None  Radiology No results found.  Procedures Procedures    Medications Ordered in ED Medications - No data to display  ED Course/ Medical Decision Making/ A&P                                 Medical Decision Making  37 year old female with past medical history as above presents today for concern of having her vital signs checked.  She had a slight headache earlier after an argument which has since resolved.  No current  complaints.  She just wants her vital signs checked.  Vital signs are within normal limits.  No acute distress noted.  She is appropriate for discharge.  Discharged in stable condition.  Does have history of bipolar and schizoaffective disorder.  Reports compliance with her home medications.  Lives with her sister.  Denies SI or HI today.   Final Clinical Impression(s) / ED Diagnoses Final diagnoses:  Nonintractable headache, unspecified chronicity pattern, unspecified headache type    Rx / DC Orders ED Discharge Orders     None         Marita [place], PA-C 05/07/23 1057    Jacalyn Lefevre, MD 05/07/23 1242

## 2023-05-22 DIAGNOSIS — F29 Unspecified psychosis not due to a substance or known physiological condition: Secondary | ICD-10-CM | POA: Diagnosis not present

## 2023-05-25 ENCOUNTER — Encounter (HOSPITAL_COMMUNITY): Payer: Self-pay

## 2023-05-25 ENCOUNTER — Emergency Department (HOSPITAL_COMMUNITY)
Admission: EM | Admit: 2023-05-25 | Discharge: 2023-05-25 | Disposition: A | Discharge status: Home / Self Care | Payer: 59 | Attending: Emergency Medicine | Admitting: Emergency Medicine

## 2023-05-25 ENCOUNTER — Other Ambulatory Visit: Payer: Self-pay

## 2023-05-25 DIAGNOSIS — R55 Syncope and collapse: Secondary | ICD-10-CM | POA: Diagnosis not present

## 2023-05-25 DIAGNOSIS — I959 Hypotension, unspecified: Secondary | ICD-10-CM | POA: Diagnosis not present

## 2023-05-25 DIAGNOSIS — K5900 Constipation, unspecified: Secondary | ICD-10-CM | POA: Diagnosis not present

## 2023-05-25 DIAGNOSIS — R42 Dizziness and giddiness: Secondary | ICD-10-CM | POA: Diagnosis not present

## 2023-05-25 LAB — URINALYSIS, ROUTINE W REFLEX MICROSCOPIC
Bilirubin Urine: NEGATIVE
Glucose, UA: NEGATIVE mg/dL
Ketones, ur: NEGATIVE mg/dL
Leukocytes,Ua: NEGATIVE
Nitrite: NEGATIVE
Protein, ur: NEGATIVE mg/dL
Specific Gravity, Urine: 1.009 (ref 1.005–1.030)
pH: 6 (ref 5.0–8.0)

## 2023-05-25 LAB — CBC
HCT: 39.5 % (ref 36.0–46.0)
Hemoglobin: 12 g/dL (ref 12.0–15.0)
MCH: 24.9 pg — ABNORMAL LOW (ref 26.0–34.0)
MCHC: 30.4 g/dL (ref 30.0–36.0)
MCV: 82.1 fL (ref 80.0–100.0)
Platelets: 222 10*3/uL (ref 150–400)
RBC: 4.81 MIL/uL (ref 3.87–5.11)
RDW: 16 % — ABNORMAL HIGH (ref 11.5–15.5)
WBC: 7.9 10*3/uL (ref 4.0–10.5)
nRBC: 0 % (ref 0.0–0.2)

## 2023-05-25 LAB — BASIC METABOLIC PANEL
Anion gap: 9 (ref 5–15)
BUN: 8 mg/dL (ref 6–20)
CO2: 23 mmol/L (ref 22–32)
Calcium: 8.4 mg/dL — ABNORMAL LOW (ref 8.9–10.3)
Chloride: 103 mmol/L (ref 98–111)
Creatinine, Ser: 0.78 mg/dL (ref 0.44–1.00)
GFR, Estimated: >60 mL/min (ref >60)
Glucose, Bld: 115 mg/dL — ABNORMAL HIGH (ref 70–99)
Potassium: 3.7 mmol/L (ref 3.5–5.1)
Sodium: 135 mmol/L (ref 135–145)

## 2023-05-25 LAB — HCG, SERUM, QUALITATIVE: Preg, Serum: NEGATIVE

## 2023-05-25 LAB — CBG MONITORING, ED: Glucose-Capillary: 107 mg/dL — ABNORMAL HIGH (ref 70–99)

## 2023-05-25 NOTE — ED Triage Notes (Signed)
Pt arrived from home via [facility] s/p syncopal episode. Pt denies any pain anywhere.

## 2023-05-25 NOTE — ED Provider Notes (Signed)
[name] Provider Note   CSN: [id number] Arrival date & time: 05/25/23  [id number]     History  Chief Complaint  Patient presents with   Loss of Consciousness    [name] is a 37 y.o. female.  Patient is a 37 year old female with a history of mental illness who is currently on Haldol and benztropine who is presenting today after a syncopal event at home.  Patient reports that she was watching the ball game last night and fell asleep in the front room on the couch.  She felt fine yesterday but reports that when she woke up this morning she felt dizzy.  She was walking into the kitchen and remembers feeling dizzy and lightheaded and then woke up on the floor.  She was able to get up and called 911.  She states it took about 5 minutes for them to get there and she did report they told her when they got there that her blood pressure was low.  She denies having any chest pain, palpitations or shortness of breath before or after the event.  She was feeling fine over the last few days and has been eating and drinking normally.  She does not use any drugs or alcohol and does not take any blood pressure medication at this time.  She denies prior history of syncope.  Menses have been normal.  She denies any recent illnesses.  She was given fluid by EMS and reports now she feels back to her normal self.  She denies any headaches or injury from falling.  The history is provided by the patient, medical records and a relative.  Loss of Consciousness      Home Medications Prior to Admission medications   Medication Sig Start Date End Date Taking? Authorizing Provider  amLODipine (NORVASC) 5 MG tablet Take 1 tablet (5 mg total) by mouth daily. For high blood pressure 02/09/20   Nwoko, Nicole Kindred I, NP  benztropine (COGENTIN) 0.5 MG tablet Take 1 tablet (0.5 mg total) by mouth 2 (two) times daily. For prevention of drug induced tremors 02/08/20   Armandina Stammer I, NP   carbamazepine (TEGRETOL) 100 MG chewable tablet Chew 3 tablets (300 mg total) by mouth 2 (two) times daily. For mood stabilization 02/08/20   Armandina Stammer I, NP  haloperidol (HALDOL) 5 MG tablet Take 1 tablet (5 mg total) by mouth 3 (three) times daily. For mood control 02/08/20   Armandina Stammer I, NP  Multiple Vitamin (MULTIVITAMIN ADULT) TABS Take 1 tablet by mouth daily.    [provider]  risperidone (RISPERDAL M-TABS) 3 MG disintegrating tablet Take 1 tablet (3 mg total) by mouth 2 (two) times daily. For mood control 02/08/20   Armandina Stammer I, NP      Allergies    Quetiapine, Yes for women [intimacy products], and Zyprexa [olanzapine]    Review of Systems   Review of Systems  Cardiovascular:  Positive for syncope.    Physical Exam Updated Vital Signs BP 100/70   Pulse 81   Temp 99 F (37.2 C) (Oral)   Resp 18   LMP 05/04/2023 (Exact Date)   SpO2 99%  Physical Exam Vitals and nursing note reviewed.  Constitutional:      General: She is not in acute distress.    Appearance: She is well-developed.  HENT:     Head: Normocephalic and atraumatic.  Eyes:     Pupils: Pupils are equal, round, and reactive  to light.  Cardiovascular:     Rate and Rhythm: Normal rate and regular rhythm.     Heart sounds: Normal heart sounds. No murmur heard.    No friction rub.  Pulmonary:     Effort: Pulmonary effort is normal.     Breath sounds: Normal breath sounds. No wheezing or rales.  Abdominal:     General: Bowel sounds are normal. There is no distension.     Palpations: Abdomen is soft.     Tenderness: There is no abdominal tenderness. There is no guarding or rebound.  Musculoskeletal:        General: No tenderness. Normal range of motion.     Cervical back: Normal range of motion and neck supple. No tenderness.     Right lower leg: No edema.     Left lower leg: No edema.     Comments: No edema  Skin:    General: Skin is warm and dry.     Findings: No rash.  Neurological:      Mental Status: She is alert and oriented to person, place, and time. Mental status is at baseline.     Cranial Nerves: No cranial nerve deficit.  Psychiatric:        Mood and Affect: Mood normal.        Behavior: Behavior normal.     ED Results / Procedures / Treatments   Labs (all labs ordered are listed, but only abnormal results are displayed) Labs Reviewed  BASIC METABOLIC PANEL - Abnormal; Notable for the following components:      Result Value   Glucose, Bld 115 (*)    Calcium 8.4 (*)    All other components within normal limits  CBC - Abnormal; Notable for the following components:   MCH 24.9 (*)    RDW 16.0 (*)    All other components within normal limits  URINALYSIS, ROUTINE W REFLEX MICROSCOPIC - Abnormal; Notable for the following components:   Hgb urine dipstick MODERATE (*)    Bacteria, UA RARE (*)    All other components within normal limits  CBG MONITORING, ED - Abnormal; Notable for the following components:   Glucose-Capillary 107 (*)    All other components within normal limits  HCG, SERUM, QUALITATIVE    EKG EKG Interpretation Date/Time:  Monday May 25 2023 06:53:15 EDT Ventricular Rate:  83 PR Interval:  175 QRS Duration:  77 QT Interval:  340 QTC Calculation: 400 R Axis:   49  Text Interpretation: Sinus rhythm Low voltage, precordial leads Borderline T abnormalities, anterior leads No significant change since last tracing Confirmed by [name] ([id number]) on 05/25/2023 7:53:26 AM  Radiology No results found.  Procedures Procedures    Medications Ordered in ED Medications - No data to display  ED Course/ Medical Decision Making/ A&P                                 Medical Decision Making Amount and/or Complexity of Data Reviewed Labs: ordered. Decision-making details documented in ED Course. ECG/medicine tests: ordered and independent interpretation performed. Decision-making details documented in ED Course.   Pt   presenting today with a complaint that caries a high risk for morbidity and mortality.  Here today after a syncopal event at home.  Patient did have prodromal symptoms of feeling dizzy and lightheaded prior to the event.  Also EMS reported that patient's blood pressure was low upon their  arrival and improved with IV fluids.  Patient reports symptoms free now with a blood pressure in the low 100s over 70s.  She denies any recent illness.  No findings to suggest PE.  Will ensure no evidence of pregnancy however patient denies and has been having normal menses.  Low suspicion for anemia or GI bleeding.  Patient does not take any medications that should affect her blood pressure.  Suspect orthostatic event.  Patient tolerating p.o.'s and well-appearing at this time.  I independently interpreted patient's labs and EKG.  BMP within normal limits, CBC within normal limits with a normal hemoglobin of 12, hCG is negative.  EKG without significant findings today.  No signs of dysrhythmia, prolonged QT, Brugada's or WPW. 9:58 AM Patient's blood pressure has remained normal.  She was able to stand and walk to the bathroom without symptoms of feeling lightheaded or dizzy.  All the findings were discussed with the patient and her family member who is at bedside.  In the past she had been prescribed blood pressure medication but consistently reiterates that she is not taking anything now.  At this time feel that patient does not need any further testing and is stable for discharge home but did give precautions on staying hydrated and returning with worsening episodes of syncope or new symptoms of chest pain or shortness of breath.  They are comfortable with this plan.         Final Clinical Impression(s) / ED Diagnoses Final diagnoses:  Syncope, unspecified syncope type    Rx / DC Orders ED Discharge Orders     None         Monique Sprout, MD 05/25/23 [phone]

## 2023-05-25 NOTE — Discharge Instructions (Addendum)
All your blood work today looks normal.  You most likely passed out today because your blood pressure dropped when you stood up.  Make sure you are getting plenty to drink and eat.  If you start having any chest pain, shortness of breath or recurrent episodes of passing out return to the emergency room.

## 2023-05-27 DIAGNOSIS — Z0000 Encounter for general adult medical examination without abnormal findings: Secondary | ICD-10-CM | POA: Diagnosis not present

## 2023-05-27 DIAGNOSIS — E559 Vitamin D deficiency, unspecified: Secondary | ICD-10-CM | POA: Diagnosis not present

## 2023-05-27 DIAGNOSIS — Z13220 Encounter for screening for lipoid disorders: Secondary | ICD-10-CM | POA: Diagnosis not present

## 2023-05-27 DIAGNOSIS — R6889 Other general symptoms and signs: Secondary | ICD-10-CM | POA: Diagnosis not present

## 2023-06-19 DIAGNOSIS — F29 Unspecified psychosis not due to a substance or known physiological condition: Secondary | ICD-10-CM | POA: Diagnosis not present

## 2023-06-30 ENCOUNTER — Telehealth: Payer: Self-pay

## 2023-06-30 NOTE — Telephone Encounter (Signed)
Transition Care Management Unsuccessful Follow-up Telephone Call  Date of discharge and from where:  [facility]  Attempts:  1st Attempt  Reason for unsuccessful TCM follow-up call:  No answer/busy   Lenard Forth [place]  [facility], [facility] Guide, Phone: [phone] Website: [name]

## 2023-07-01 ENCOUNTER — Telehealth: Payer: Self-pay

## 2023-07-01 NOTE — Telephone Encounter (Signed)
Transition Care Management Unsuccessful Follow-up Telephone Call  Date of discharge and from where:  [name] 9/30  Attempts:  2nd Attempt  Reason for unsuccessful TCM follow-up call:  No answer/busy   [name]  [facility], [facility] Guide, Phone: [phone] Website: [name]

## 2023-07-17 DIAGNOSIS — F29 Unspecified psychosis not due to a substance or known physiological condition: Secondary | ICD-10-CM | POA: Diagnosis not present

## 2023-08-17 DIAGNOSIS — F29 Unspecified psychosis not due to a substance or known physiological condition: Secondary | ICD-10-CM | POA: Diagnosis not present

## 2023-08-25 ENCOUNTER — Ambulatory Visit: Payer: 59 | Admitting: Obstetrics and Gynecology

## 2023-09-03 ENCOUNTER — Other Ambulatory Visit: Payer: Self-pay | Admitting: Internal Medicine

## 2023-09-03 DIAGNOSIS — M62569 Muscle wasting and atrophy, not elsewhere classified, unspecified lower leg: Secondary | ICD-10-CM

## 2023-11-30 NOTE — Progress Notes (Deleted)
 Psychiatric Initial Adult Assessment   Patient Identification: [name] MRN:  [id number] Date of Evaluation:  11/30/2023 Referral Source: PCP Chief Complaint:  No chief complaint on file.  Visit Diagnosis: No diagnosis found.   Assessment:  [name] is a 38 y.o. female with a history of paranoid schizophrenia vs bipolar disorder vs schioaffective disorder who presents virtually to [facility] at [facility] for initial evaluation on 11/30/2023.  At initial evaluation patient reports ***  A number of assessments were performed during the evaluation today including PHQ-9 which they scored a *** on, GAD-7 which they scored a *** on, and [place] suicide severity screening which showed ***.  Based on these assessments patient would benefit from medication adjustment to better target their symptoms.  Plan: - Risperdal 3 mg BID - Haldol decanoate 100 mg Qmonthly - Haldol 5 mg BID - CBC, CMP. A1c, lipid profile reviewed  - Referred for neuropsychological testing - Crisis resources reviewed - Follow up in  History of Present Illness:  ***  [name] is a 38 y.o. female with a past psychiatric history of chronic schizophrenia and no significant past medical history who presented to [facility] ED on 12/22/2022 for acute decompensation of chronic schizophrenia in context of med non-compliance. She was brought in as an IVC from [facility] and given the severity of her presentation she was recommended to seek higher level of care at [facility] ED.   IVC Paperwork:  "Respondent was brought to [facility] by her sister upon recommendation of respondent's attorney, [employer]. Respondent denied thoughts about harming herself or others, but rambled, at times incoherently, about various topics and displayed an irritable mood. She reported believing that the [employer] and Police in [place] were planning bad things against her. When asked about current stressors, she said, 'Ill  manneredness. My billing is orderly. I want to betterment myself. The maintenance crew.' She declines to discuss previous mental health treatment, but her sister said that she had previously received treatment for 'Bipolar-Schizophrenia' and had previously been hospitalized at [facility] several years ago, which Respondent agreed with. Her sister said that Respondent had lived with their mother until she was placed in a nursing home on 10/08/22 for dementia. Her sister said that Respondent was formerly on psychiatric medication, but her mother had stopped the medication and instead had her take vitamins. According to the sister, Respondent is presently at risk for being evicted from her home, but does not appear able to understand her situation."  While in ED, patient was yelling, cursing and verbally aggressive. She was also hitting the walls and threatening to harm the staff. Given the concerns for safety of patient and staff, she was taken to the seclusion room to be restraint as she could not be verbally redirected. She was noted to be disorganized, paranoid, tangential and RIS. Per records, patient has history of schizophrenia and prior inpatient admission at [facility]. Her mother was her guardian and she has lived with her mother all her life. Recently her mother has been placed in dementia care around Feb of this year. Patient is facing eviction from her home and her sister took her to [facility] to seek medication management. Per sister, patients mother had taken patient off all her psych meds and was only giving her multivitamins. Patient does not have history of illicit substance abuse or alcohol abuse. UDS and BAL negative. She is presently on disability and unemployed. Sister reports patient is able to do  all her ADLS but will need assistance with being med compliant and attending her OP appointments.   She continues to remain evasive and suspicious. She remains disorganized and unable to make  clear statement as to what brought her to the hospital. She does not believe she has a mental health issues and is refusing medications. Per records, sister is looking into group home placement. Will have CM discuss need for switching guardianship from mother to sister and provide sister with more information regarding group homes and long term mental health care. She is initited on oral Haldol with plans to transition to LAI.   Plan  - Haldol 5mg  BID with plans to transition to [facility]I     The patient presents for a psychological evaluation and right-sided weakness. She is accompanied by her legal guardian.  The patient's legal guardian has requested a referral for a psychological evaluation to establish an IDD diagnosis.   Associated Signs/Symptoms: Depression Symptoms:  {DEPRESSION SYMPTOMS:20000} (Hypo) Manic Symptoms:  {BHH MANIC SYMPTOMS:22872} Anxiety Symptoms:  {BHH ANXIETY SYMPTOMS:22873} Psychotic Symptoms:  Hallucinations: Auditory Disorganized thoughts PTSD Symptoms: NA  Past Psychiatric History: *** Past Dx: Schizoaffective disorder, bipolar type, mania, paranoia Current mental health provider(s): none Previous psychiatric medication trials: Risperdal, HALDOL, cogentin Previous psychiatric hospitalizations: [facility] and most recently to [facility] in 2024 due to decompensation of schizophrenia in the context of medication non compliance History of trauma/abuse: UTA  Non-Suicidal Self-Injury: UTA Suicide Attempt History: denies Violence History: verbally aggressive  Substance Abuse History   Marijuana: Denies Cocaine: Denies Opiates: Denies Stimulants: Denies Benzodiazepine: Denies Tobacco: Denies Alcohol: Denies Other illicit drug usage: Denies     Previous Psychotropic Medications: Yes   Substance Abuse History in the last 12 months:  No.  Consequences of Substance Abuse: NA  Past Medical History:  Past Medical History:  Diagnosis Date   Bipolar 1  disorder (HCC) 2011   Paranoia (HCC)    Schizoaffective disorder (HCC)    No past surgical history on file.  Family Psychiatric History: Mother had dementia  Family History:  Family History  Problem Relation Age of Onset   Cancer Father        lymphoma   Hypertension Mother    Diabetes Neg Hx    Heart disease Neg Hx     Social History:   Social History   Socioeconomic History   Marital status: Single    Spouse name: Not on file   Number of children: 0   Years of education: some colle   Highest education level: Not on file  Occupational History   Occupation: [name]     Comment: trying to start   Tobacco Use   Smoking status: Never   Smokeless tobacco: Never  Vaping Use   Vaping status: Never Used  Substance and Sexual Activity   Alcohol use: No    Comment: Pt denies    Drug use: No    Comment: Pt denies   Sexual activity: Not Currently    Birth control/protection: Abstinence, None  Other Topics Concern   Not on file  Social History Narrative   Work or School: not working   Home Situation: lives with mother   Spiritual Beliefs: Christian   Lifestyle: no regular exercise; healthy diet         Social Drivers of Health   Financial Resource Strain: Patient Declined (11/13/2023)   Received from [facility]   Overall Financial Resource Strain (CARDIA)    Difficulty of Paying  Living Expenses: Patient declined  Food Insecurity: No Food Insecurity (11/13/2023)   Received from [facility]   Hunger Vital Sign    Worried About Running Out of Food in the Last Year: Never true    Ran Out of Food in the Last Year: Never true  Transportation Needs: Unmet Transportation Needs (11/13/2023)   Received from [facility] - Transportation    Lack of Transportation (Medical): No    Lack of Transportation (Non-Medical): Yes  Physical Activity: Insufficiently Active (11/13/2023)   [facility]   Exercise Vital Sign    Days of Exercise per Week: 3  days    Minutes of Exercise per Session: 30 min  Stress: Stress Concern Present (11/13/2023)   Received from [facility] - Occupational Stress Questionnaire    Feeling of Stress : Rather much  Social Connections: Somewhat Isolated (11/13/2023)   Received from [facility]   Social Network    How would you rate your social network (family, work, friends)?: Restricted participation with some degree of social isolation    Additional Social History: *** Educational history: 10th Living situation: Apartment alone Relationship status and parenting history: Single Occupational history: Disabled Reported legal history: none Access to firearms or deadly weapons: no   Allergies:   Allergies  Allergen Reactions   Quetiapine Other (See Comments)    Facial skin irritation   Yes For Women [Intimacy Products] Rash   Zyprexa [Olanzapine] Rash    Metabolic Disorder Labs: Lab Results  Component Value Date   HGBA1C 5.5 11/02/2017   MPG 111.15 11/02/2017   No results found for: "PROLACTIN" Lab Results  Component Value Date   CHOL 179 07/27/2014   TRIG 49 07/27/2014   HDL 44 07/27/2014   CHOLHDL 5 06/21/2014   VLDL 10 07/27/2014   LDLCALC 125 (H) 07/27/2014   LDLCALC 100 (H) 06/21/2014   Lab Results  Component Value Date   TSH 1.042 07/19/2017    Therapeutic Level Labs: Lab Results  Component Value Date   LITHIUM 0.71 (L) 05/25/2012   Lab Results  Component Value Date   CBMZ 8.4 02/03/2020   No results found for: "VALPROATE"  Current Medications: Current Outpatient Medications  Medication Sig Dispense Refill   amLODipine (NORVASC) 5 MG tablet Take 1 tablet (5 mg total) by mouth daily. For high blood pressure 10 tablet 0   benztropine (COGENTIN) 0.5 MG tablet Take 1 tablet (0.5 mg total) by mouth 2 (two) times daily. For prevention of drug induced tremors 60 tablet 0   carbamazepine (TEGRETOL) 100 MG chewable tablet Chew 3  tablets (300 mg total) by mouth 2 (two) times daily. For mood stabilization 90 tablet 1   haloperidol (HALDOL) 5 MG tablet Take 1 tablet (5 mg total) by mouth 3 (three) times daily. For mood control 90 tablet 0   Multiple Vitamin (MULTIVITAMIN ADULT) TABS Take 1 tablet by mouth daily.     risperidone (RISPERDAL M-TABS) 3 MG disintegrating tablet Take 1 tablet (3 mg total) by mouth 2 (two) times daily. For mood control 60 tablet 0   No current facility-administered medications for this visit.    Psychiatric Specialty Exam: Review of Systems  There were no vitals taken for this visit.There is no height or weight on file to calculate BMI.  General Appearance: {Appearance:22683}  Eye Contact:  {BHH EYE CONTACT:22684}  Speech:  {Speech:22685}  Volume:  {Volume (PAA):22686}  Mood:  {BHH MOOD:22306}  Affect:  {Affect (PAA):22687}  Thought Process:  {Thought Process (PAA):22688}  Orientation:  {BHH ORIENTATION (PAA):22689}  Thought Content:  {Thought Content:22690}  Suicidal Thoughts:  {ST/HT (PAA):22692}  Homicidal Thoughts:  {ST/HT (PAA):22692}  Memory:  {BHH MEMORY:22881}  Judgement:  {Judgement (PAA):22694}  Insight:  {Insight (PAA):22695}  Psychomotor Activity:  {Psychomotor (PAA):22696}  Concentration:  {Concentration:21399}  Recall:  {BHH GOOD/FAIR/POOR:22877}  Fund of Knowledge:{BHH GOOD/FAIR/POOR:22877}  Language: {BHH GOOD/FAIR/POOR:22877}  Akathisia:  {BHH YES OR NO:22294}    AIMS (if indicated):  {Desc; done/not:10129}  Assets:  {Assets (PAA):22698}  ADL's:  {BHH FIE'P:32951}  Cognition: {chl bhh cognition:304700322}  Sleep:  {BHH GOOD/FAIR/POOR:22877}   Screenings: AIMS    Flowsheet Row [facility]) [facility] ([facility] (Discharged[facility] Ad[facility] (Discharged) from  [facility] ([facility]) [facility]  AIMS Total Score 0 0 0 0 0      AUDIT    Flowsheet Row [facility]) [facility] Ad[facility] (Discharged) [facility] Ad[facility] (Discharged) from [facility] Ad[facility] (Discharged) [facility] ([facility]) [facility]  Alcohol Use Disorder Identification Test Final Score (AUDIT) 0 0 0 0 0      PHQ2-9    Flowsheet Row Office Visit from 12/05/2019 in [facility] [place] Office Visit from 10/26/2014 in [facility] [place]  PHQ-2 Total Score 1 0  PHQ-9 Total Score 2 --      Flowsheet Row ED from 05/25/2023 in [facility] Department at [facility] ED from 05/07/2023 in [facility] Department at [facility] ED from 08/01/2022 in [facility] Emergency Department at [facility]  C-SSRS RISK CATEGORY No Risk No Risk No Risk        Collaboration of Care: Medication Management AEB ***, Primary Care Provider AEB chart review, and Psychiatrist AEB chart review  Patient/Guardian was advised Release of Information must be obtained prior to any record release in order to collaborate their care with an outside provider. Patient/Guardian was advised if they have not already done so to contact the registration department to sign all necessary forms in order for [place] to release information regarding their care.   Consent: Patient/Guardian gives verbal consent for treatment and assignment of benefits for services provided during this visit. Patient/Guardian expressed  understanding and agreed to proceed.   Stasia Cavalier, MD 4/7/20251:06 PM    Virtual Visit via Video Note  I connected with [name] on 11/30/23 at 10:00 AM EDT by a video enabled telemedicine application and verified that I am speaking with the correct person using two identifiers.  Location: Patient: Home Provider: Home office   I discussed the limitations of evaluation and management by telemedicine and the availability of in person appointments. The patient expressed understanding and agreed to proceed.   I discussed the assessment and treatment plan with the patient. The patient was provided an opportunity to ask questions and all were answered. The patient agreed with the plan and demonstrated an understanding of the instructions.  The patient was advised to call back or seek an in-person evaluation if the symptoms worsen or if the condition fails to improve as anticipated.  I provided *** minutes of non-face-to-face time during this encounter.   Stasia Cavalier, MD

## 2023-12-05 ENCOUNTER — Encounter (HOSPITAL_COMMUNITY): Payer: MEDICAID | Admitting: Psychiatry

## 2023-12-05 ENCOUNTER — Encounter (HOSPITAL_COMMUNITY): Payer: Self-pay

## 2023-12-05 NOTE — Progress Notes (Signed)
 This encounter was created in error - please disregard.

## 2024-01-25 NOTE — Progress Notes (Unsigned)
 Psychiatric Initial Adult Assessment   Patient Identification: LYRICAL SOWLE MRN:  [id number] Date of Evaluation:  01/26/2024 Referral Source: PCP Chief Complaint:   Chief Complaint  Patient presents with   Establish Care   Visit Diagnosis:    ICD-10-CM   1. Schizophrenia, paranoid type (HCC)  F20.0      Assessment:  Stacyann P Heron is a 38 y.o. female with a history of Schizoprenia who presents in person to [facility] at [facility] for initial evaluation on 01/25/2024.    At initial evaluation patient reports a longstanding history of schizophrenia starting around the age of 47.  She endorsed auditory and visual hallucinations along with command hallucinations.  Patient has had multiple past hospitalizations last in 2024 though was not compliant with medications up until January 2025.  Over the past 6 months while being on medications the visual hallucinations have subsided along with the command hallucinations.  Auto hallucinations have decreased to faint murmur or background noise that patient does not find to be overly disruptive.  She does carry history of schizoaffective disorder in the past though denied any history of hypomanic or manic episodes.  Patient was notably disorganized and sister who his guardian was not as aware of the events that occurred prior to her mother's passing at the beginning of 2025.  That being the case we will diagnose patient with paranoid schizophrenia however further evaluation would be needed to concretely rule out schizoaffective disorder.  Of note since patient is having some residual auditory hallucinations for the titration of Haldol  could be appropriate.  She also would benefit from taking her prescribed Cogentin  which she has been noncompliant with.  Patient was not here however to establish with new psychiatric provider as she is already connected with a provider [facility].  They have been pursuing an IDD diagnosis the  patient could go to an alternative family living placement.  Informed patient and her guardian that this provider is not able to make that diagnoses and she would need neuropsych testing.  We also did review past history and records which had limited evidence of IDD as patient had reportedly done well in school prior to the onset of her psychotic illness.  Furthermore she has no record of IEP or being in special education classes.  This being the case while patient can first to neuropsych testing we suggested also looking into assisted living facilities or group homes that might be better suited to manage somebody with psychotic illness.  Risk Assessment: A suicide and violence risk assessment was performed as part of this evaluation. There patient is deemed to be at chronic elevated risk for self-harm/suicide given the following factors: impulsive tendencies, history of schizophrenia, chronic impulsivity, and chronic poor judgement. These risk factors are mitigated by the following factors: no known access to weapons or firearms, no history of previous suicide attempts, motivation for treatment, supportive family, sense of responsibility to family and social supports, and support system in agreement with treatment recommendations. The patient is deemed to be at chronic elevated risk for violence given the following factors: lack of insight and chronic impulsivity. These risk factors are mitigated by the following factors: religiosity and connectedness to family. There is no acute risk for suicide or violence at this time. The patient was educated about relevant modifiable risk factors including following recommendations for treatment of psychiatric illness and abstaining from substance abuse.  While future psychiatric events cannot be accurately predicted, the patient does  not currently require  acute inpatient psychiatric care and does not currently meet [place]  involuntary commitment criteria.  Patient  was given contact information for crisis resources, behavioral health clinic and was instructed to call 911 for emergencies.   Plan: # Paranoid schizophrenia continue to rule out schizoaffective disorder Past medication trials:  Status of problem: Ongoing Interventions: Patient uncertain as she has been hospitalized and trialed on multiple medications in the past though all been discontinued after discharge.  Chart review shows Haldol , Risperdal , Tegretol , Cogentin , Ativan , Geodon , lithium , hydroxyzine , Zyprexa , Invega , trazodone , and Ambien  -- Haldol  decanoate 100 mg Qmonthly - CBC, CMP. A1c, lipid profile reviewed - Could refer for neuropsychological testing in the future if needed - Crisis resources reviewed - Follow up in the future as needed.  Patient already connected with another psychiatrist at [place] [facility]  History of Present Illness: Patient was referred by her primary care provider.  The referral was for psychiatric evaluation in the hopes of being admitted to a group home or living facility.  On patient's arrival this was reclarified as patient and her guardian looking for an IDD diagnosis so patient could go to an alternative family living location that her sister has found and has availability.    Patient presented alongside her sister who is her legal guardian.  Of note Esraa had been care for by her mother until she passed away around 5 months ago.  Since then both of her sisters have taken on care responsibilities with her older sister providing housing for Estephania and her younger sister managing paperwork and such.    Psychiatrically Grisela reports that she was diagnosed with schizophrenia around the age of 36.  She endorses a history of auditory and visual hallucinations as well as command hallucinations.  She endorses having seen bugs catering around, hearing murmurs that sound like faint background noise, and previously having command hallucinations telling her to hurt others  around her.  The hallucinations have been much better controlled ever since she started on medications around 5 months ago.  Notably patient's mother did not necessarily believe in medications at prefer holistic treatments.  Lesa was brought to the ED and hospitalized more than 10 times due to symptoms the medications were always discontinued after discharge.  Currently Holland only reports some mild faint murmuring that she hears in the background since being on the Haldol  injection.  The symptoms improved with both the medication and through the patient's spiritual practice of lighting a virtual candle and praying.  We reviewed for any history of mania and while patient does carry past history of schizoaffective disorder and been prescribed multiple mood stabilizers there is unclear history of mania today.  That said patient is a poor historian and guardian has not been largely present in her life up until the past 5 months.  Prior to this Rosea spent most of her time in the house with her mother rarely leaving.  Deosha was noted to be disorganized today with some religious preoccupations.  She had difficulty providing a linear story and would frequently go into tangential thought processes.  She denied any depression or anxiety symptoms and sister had not noted any either.  Patient did not recall any past trauma history.  On review of patient's symptoms and presentation it does appear like she has ongoing auditory hallucinations.  Furthermore she has not been taking any oral medications including the Tegretol  or Cogentin .  Her sister has noticed some fine tremor in her hands though  patient did not display that on evaluation today.  As we had initially thought patient was here to establish care we discussed medications and Dustin's including titrating the Haldol  decanoate to 150 mg with possible consideration of transition to atypical antipsychotic in the future to decrease risk of EPS.  We also discussed  starting the prescribed Cogentin .  After further clarification however it became clear that patient was not here to establish care and instead was planning to continue with her current psychiatric provider at [facility].  Given this we will not make any medication adjustments.  The primary purpose for their visit was to receive an IDD diagnoses so the patient can go to an alternative family living home. As their provider in [facility] reported being unable to do this patient presented here.  We explained that this is a neuropsychological evaluation and would not be able to be completed by this provider as well.  We did attempt to review records looking into school performance and past diagnoses however there is no documented history of intellectual disability.  Furthermore they had some school records showing that she did quite poorly once arriving to high school around the time of onset of her psychotic illness.  She did not complete high school and is unclear whether she received her GED.  There was no history of IEP or patient being involved in special education classes during her school career.  It also is believed that she did quite well in classes prior to high school and the onset of her psychotic illness.  Given this we cannot make a diagnosis of IDD patient would need further neuropsychological eval.  Seeing as the primary goal of this was to get patient into an alternative living facility due to sisters being unable to continue to manage care we did discuss some other options that would not require an IDD diagnosis and instead could use the schizoaffective disorder diagnosis.  Past Psychiatric History:  Past psychiatric diagnoses: Schizophrenia versus schizoaffective disorder Psychiatric hospitalizations: Has had 10+ hospitalizations the first was in 2004 and last in 2024 to [facility].  Past suicide attempts: Denies Hx of self harm: Denies Hx of violence towards others: Has had command  hallucinations telling her to harm others Prior psychiatric providers: Connected with Dr. Adegoroye through [facility] since the beginning of 2025.  Prior to that has only had psychiatric care through inpatient hospitalizations Prior therapy: No prior therapist Access to firearms: Denies  Prior medication trials: Starting in 2004 she was temporarily started on medications while hospitalized but was stopped when she left the hospital. She was started on Haldol  around January of 2025, due to patient recalling benefit during one of her hospitalizations  Substance use: Patient denies any substance use  Past Medical History:  Past Medical History:  Diagnosis Date   Bipolar 1 disorder (HCC) 2011   Paranoia (HCC)    Schizoaffective disorder (HCC)    No past surgical history on file.  Family Psychiatric History: Patient reports a history of substance use on the paternal side of her family  Family History:  Family History  Problem Relation Age of Onset   Cancer Father        lymphoma   Hypertension Mother    Diabetes Neg Hx    Heart disease Neg Hx     Social History:   Social History   Socioeconomic History   Marital status: Single    Spouse name: Not on file   Number of children:  0   Years of education: some colle   Highest education level: Not on file  Occupational History   Occupation: [employer]     Comment: trying to start   Tobacco Use   Smoking status: Never   Smokeless tobacco: Never  Vaping Use   Vaping status: Never Used  Substance and Sexual Activity   Alcohol use: No    Comment: Pt denies    Drug use: No    Comment: Pt denies   Sexual activity: Not Currently    Birth control/protection: Abstinence, None  Other Topics Concern   Not on file  Social History Narrative   Work or School: not working   Home Situation: lives with mother   Spiritual Beliefs: Christian   Lifestyle: no regular exercise; healthy diet         Social Drivers of Health    Financial Resource Strain: Patient Declined (11/13/2023)   Received from [facility]   Overall Financial Resource Strain (CARDIA)    Difficulty of Paying Living Expenses: Patient declined  Food Insecurity: No Food Insecurity (11/13/2023)   [facility] from [facility]   Hunger Vital Sign    Worried About Running Out of Food in the Last Year: Never true    Ran Out of Food in the Last Year: Never true  Transportation Needs: Unmet Transportation Needs (11/13/2023)   Received [facility] - Transportation    Lack of Transportation (Medical): No    Lack of Transportation (Non-Medical): Yes  Physical Activity: Insufficiently Active (11/13/2023)   Received [facility]   Exercise Vital Sign    Days of Exercise per Week: 3 days    Minutes of Exercise per Session: 30 min  Stress: Stress Concern Present (11/13/2023)   Received from [facility] - Occupational Stress Questionnaire    Feeling of Stress : Rather much  Social Connections: Somewhat Isolated (11/13/2023)   Received from [facility]   Social Network    How would you rate your social network (family, work, friends)?: Restricted participation with some degree of social isolation    Additional Social History: Patient had lived with her mother until her passing at the beginning of 2025.  Since then she has been living with her older sister.  Her younger sister is her guardian and manages legal paperwork.  Patient's sisters are unable to provide the necessary long-term care for the patient and are looking for a longer term placement option for her.  Allergies:   Allergies  Allergen Reactions   Quetiapine Other (See Comments)    Facial skin irritation   Yes For Women [Intimacy Products] Rash   Zyprexa  [Olanzapine ] Rash    Metabolic Disorder Labs: Lab Results  Component Value Date   HGBA1C 5.5 11/02/2017   MPG 111.15 11/02/2017   No results found for:  "PROLACTIN" Lab Results  Component Value Date   CHOL 179 07/27/2014   TRIG 49 07/27/2014   HDL 44 07/27/2014   CHOLHDL 5 06/21/2014   VLDL 10 07/27/2014   LDLCALC 125 (H) 07/27/2014   LDLCALC 100 (H) 06/21/2014   Lab Results  Component Value Date   TSH 1.042 07/19/2017    Therapeutic Level Labs: Lab Results  Component Value Date   LITHIUM  0.71 (L) 05/25/2012   Lab Results  Component Value Date   CBMZ 8.4 02/03/2020   No results found for: "VALPROATE"  Current Medications: Current Outpatient Medications  Medication Sig Dispense  Refill   amLODipine  (NORVASC ) 5 MG tablet Take 1 tablet (5 mg total) by mouth daily. For high blood pressure 10 tablet 0   benztropine  (COGENTIN ) 0.5 MG tablet Take 1 tablet (0.5 mg total) by mouth 2 (two) times daily. For prevention of drug induced tremors 60 tablet 0   carbamazepine  (TEGRETOL ) 100 MG chewable tablet Chew 3 tablets (300 mg total) by mouth 2 (two) times daily. For mood stabilization 90 tablet 1   haloperidol  (HALDOL ) 5 MG tablet Take 1 tablet (5 mg total) by mouth 3 (three) times daily. For mood control 90 tablet 0   Multiple Vitamin (MULTIVITAMIN ADULT) TABS Take 1 tablet by mouth daily.     risperidone  (RISPERDAL  M-TABS) 3 MG disintegrating tablet Take 1 tablet (3 mg total) by mouth 2 (two) times daily. For mood control 60 tablet 0   No current facility-administered medications for this visit.    Musculoskeletal: Strength & Muscle Tone: within normal limits Gait & Station: normal Patient leans: N/A  Psychiatric Specialty Exam:  Psychiatric Specialty Exam: There were no vitals taken for this visit.There is no height or weight on file to calculate BMI. Review of Systems  General Appearance: Disheveled and malodorous  Eye Contact:  Fair  Speech:  Clear and Coherent  Volume:  Normal  Mood:  Anxious and Euthymic  Affect:  Congruent  Thought Content: Illogical and Tangential   Suicidal Thoughts:  No  Homicidal Thoughts:  No   Thought Process:  Disorganized  Orientation:  Full (Time, Place, and Person)    Memory: Immediate;   Fair  Judgment:  Impaired  Insight:  Lacking  Concentration:  Concentration: Fair and Attention Span: Fair  Recall:  not formally assessed   Fund of Knowledge: Poor  Language: Fair  Psychomotor Activity:  Normal though sister reported tremors which were not observed on exam today  Akathisia:  No  AIMS (if indicated): 0  Assets:  Communication Skills Housing  ADL's:  Intact  Cognition: WNL  Sleep:  Good    Screenings: AIMS    Flowsheet Row [facility] (Dis[facility] Ad[facility] (Discharged) [facility] ([facility]) [facility]  AIMS Total Score 0 0 0 0 0      AUDIT    Flowsheet Row [facility]) [facility] Ad[facility] (Discharged[facility] Ad[facility] (Discharged) from [facility] Ad[facility] (Discharged) [facility] (Dis[facility]) [facility]  Alcohol Use Disorder Identification Test Final Score (AUDIT) 0 0 0 0 0      PHQ2-9    Flowsheet Row Office Visit from 12/05/2019 in [facility] for [facility] Office Visit from 10/26/2014 in [facility]  PHQ-2 Total Score 1 0  PHQ-9 Total Score 2 --      Flowsheet Row ED from 05/25/2023 in [facility] Emergency Department at [facility] ED  from 05/07/2023 in [facility] Department at [facility] ED from 08/01/2022 in [facility] Emergency Department at [facility]  C-SSRS  RISK CATEGORY No Risk No Risk No Risk        Collaboration of Care: Primary Care Provider AEB chart review  Patient/Guardian was advised Release of Information must be obtained prior to any record release in order to collaborate their care with an outside provider. Patient/Guardian was advised if they have not already done so to contact the registration department to sign all necessary forms in order for us  to release information regarding their care.   Consent: Patient/Guardian gives verbal consent for treatment and assignment of benefits for services provided during this visit. Patient/Guardian expressed understanding and agreed to proceed.   Yves Herb, MD 01/26/24 8:25 PM

## 2024-01-26 ENCOUNTER — Other Ambulatory Visit: Payer: Self-pay

## 2024-01-26 ENCOUNTER — Encounter (HOSPITAL_COMMUNITY): Payer: Self-pay | Admitting: Psychiatry

## 2024-01-26 ENCOUNTER — Ambulatory Visit (HOSPITAL_BASED_OUTPATIENT_CLINIC_OR_DEPARTMENT_OTHER): Payer: MEDICAID | Admitting: Psychiatry

## 2024-01-26 VITALS — BP 122/74 | HR 83 | Ht 64.0 in | Wt 152.0 lb

## 2024-01-26 DIAGNOSIS — F200 Paranoid schizophrenia: Secondary | ICD-10-CM | POA: Diagnosis not present

## 2024-01-27 ENCOUNTER — Encounter (HOSPITAL_COMMUNITY): Payer: Self-pay | Admitting: Psychiatry

## 2024-02-10 ENCOUNTER — Ambulatory Visit: Payer: MEDICAID | Admitting: Obstetrics

## 2024-02-10 ENCOUNTER — Other Ambulatory Visit (HOSPITAL_COMMUNITY)
Admission: RE | Admit: 2024-02-10 | Discharge: 2024-02-10 | Disposition: A | Discharge status: Home / Self Care | Payer: MEDICAID | Source: Ambulatory Visit | Attending: Obstetrics | Admitting: Obstetrics

## 2024-02-10 ENCOUNTER — Encounter: Payer: Self-pay | Admitting: Obstetrics

## 2024-02-10 VITALS — BP 116/70 | HR 65 | Ht 64.0 in | Wt 155.0 lb

## 2024-02-10 DIAGNOSIS — Z01419 Encounter for gynecological examination (general) (routine) without abnormal findings: Secondary | ICD-10-CM | POA: Diagnosis present

## 2024-02-10 DIAGNOSIS — N898 Other specified noninflammatory disorders of vagina: Secondary | ICD-10-CM | POA: Insufficient documentation

## 2024-02-10 NOTE — Progress Notes (Signed)
 Wants STI testing. Swab only today.

## 2024-02-10 NOTE — Progress Notes (Signed)
 Subjective:        [name] is a 38 y.o. female here for a routine exam.  Current complaints: Vaginal discharge.    Personal health questionnaire:  Is patient [name], have a family history of breast and/or ovarian cancer: no Is there a family history of uterine cancer diagnosed at age < 20, gastrointestinal cancer, urinary tract cancer, family member who is a Personnel officer syndrome-associated carrier: no Is the patient overweight and hypertensive, family history of diabetes, personal history of gestational diabetes, preeclampsia or PCOS: no Is patient over 5, have PCOS,  family history of premature CHD under age 68, diabetes, smoke, have hypertension or peripheral artery disease:  no At any time, has a partner hit, kicked or otherwise hurt or frightened you?: no Over the past 2 weeks, have you felt down, depressed or hopeless?: no Over the past 2 weeks, have you felt little interest or pleasure in doing things?:no   Gynecologic History Patient's last menstrual period was 01/28/2024 (exact date). Contraception: none Last Pap: 2017. Results were: normal Last mammogram: n/a. Results were: n/a  Obstetric History OB History  Gravida Para Term Preterm AB Living  0 0 0 0 0 0  SAB IAB Ectopic Multiple Live Births  0 0 0 0 0    Past Medical History:  Diagnosis Date   Bipolar 1 disorder (HCC) 2011   Paranoia (HCC)    Schizoaffective disorder (HCC)     History reviewed. No pertinent surgical history.   Current Outpatient Medications:    haloperidol  decanoate (HALDOL  DECANOATE) 100 MG/ML injection, Inject 100 mg into the muscle., Disp: , Rfl:    Multiple Vitamin (MULTIVITAMIN ADULT) TABS, Take 1 tablet by mouth daily., Disp: , Rfl:    amLODipine  (NORVASC ) 5 MG tablet, Take 1 tablet (5 mg total) by mouth daily. For high blood pressure (Patient not taking: Reported on 01/26/2024), Disp: 10 tablet, Rfl: 0   benztropine  (COGENTIN ) 0.5 MG tablet, Take 1 tablet (0.5 mg total) by  mouth 2 (two) times daily. For prevention of drug induced tremors (Patient not taking: Reported on 01/26/2024), Disp: 60 tablet, Rfl: 0   carbamazepine  (TEGRETOL ) 100 MG chewable tablet, Chew 3 tablets (300 mg total) by mouth 2 (two) times daily. For mood stabilization (Patient not taking: Reported on 01/26/2024), Disp: 90 tablet, Rfl: 1   haloperidol  (HALDOL ) 5 MG tablet, Take 1 tablet (5 mg total) by mouth 3 (three) times daily. For mood control (Patient not taking: Reported on 01/26/2024), Disp: 90 tablet, Rfl: 0 Allergies  Allergen Reactions   Quetiapine Other (See Comments)    Facial skin irritation   Yes For Women [Intimacy Products] Rash   Zyprexa  [Olanzapine ] Rash    Social History   Tobacco Use   Smoking status: Never   Smokeless tobacco: Never  Substance Use Topics   Alcohol use: No    Comment: Pt denies     Family History  Problem Relation Age of Onset   Hypertension Mother    Cancer Father        lymphoma   Diabetes Neg Hx    Heart disease Neg Hx       Review of Systems  Constitutional: negative for fatigue and weight loss Respiratory: negative for cough and wheezing Cardiovascular: negative for chest pain, fatigue and palpitations Gastrointestinal: negative for abdominal pain and change in bowel habits Musculoskeletal:negative for myalgias Neurological: negative for gait problems and tremors Behavioral/Psych: negative for abusive relationship, depression Endocrine: negative for temperature intolerance  Genitourinary: positive for vaginal discharge.  negative for abnormal menstrual periods, genital lesions, hot flashes, sexual problems  Integument/breast: negative for breast lump, breast tenderness, nipple discharge and skin lesion(s)    Objective:       BP 116/70   Pulse 65   Ht 5' 4 (1.626 m)   Wt 155 lb (70.3 kg)   LMP 01/28/2024 (Exact Date)   BMI 26.61 kg/m  General:   Alert and no distress  Skin:   no rash or abnormalities  Lungs:   clear to  auscultation bilaterally  Heart:   regular rate and rhythm, S1, S2 normal, no murmur, click, rub or gallop  Breasts:   normal without suspicious masses, skin or nipple changes or axillary nodes  Abdomen:  normal findings: no organomegaly, soft, non-tender and no hernia  Pelvis:  External genitalia: normal general appearance Urinary system: urethral meatus normal and bladder without fullness, nontender Vaginal: normal without tenderness, induration or masses Cervix: normal appearance Adnexa: normal bimanual exam Uterus: anteverted and non-tender, normal size   Lab Review Urine pregnancy test Labs reviewed yes Radiologic studies reviewed no  I have spent a total of 20 minutes of face-to-face time, excluding clinical staff time, reviewing notes and preparing to see patient, ordering tests and/or medications, and counseling the patient.   Assessment:    1. Encounter for gynecological examination with Papanicolaou smear of cervix (Primary) Rx: - Cytology - PAP  2. Vaginal discharge Rx: - Cervicovaginal ancillary only    Plan:    Education reviewed: calcium supplements, depression evaluation, low fat, low cholesterol diet, safe sex/STD prevention, self breast exams, and weight bearing exercise. Contraception: none. Follow up in: 1 year.    [name], MD, FACOG Attending Obstetrician & Gynecologist, [facility] for [facility], [facility], [place] 02/10/2024

## 2024-02-11 LAB — CERVICOVAGINAL ANCILLARY ONLY
Bacterial Vaginitis (gardnerella): NEGATIVE
Candida Glabrata: NEGATIVE
Candida Vaginitis: NEGATIVE
Chlamydia: NEGATIVE
Comment: NEGATIVE
Comment: NEGATIVE
Comment: NEGATIVE
Comment: NEGATIVE
Comment: NEGATIVE
Comment: NORMAL
Neisseria Gonorrhea: NEGATIVE
Trichomonas: NEGATIVE

## 2024-02-15 LAB — CYTOLOGY - PAP
Comment: NEGATIVE
Diagnosis: NEGATIVE
High risk HPV: NEGATIVE
